# Patient Record
Sex: Female | Born: 1970
Health system: Southern US, Community
[De-identification: ages and names within clinical notes are randomized; demographics above are authoritative.]

## PROBLEM LIST (undated history)

## (undated) DIAGNOSIS — D649 Anemia, unspecified: Secondary | ICD-10-CM

## (undated) DIAGNOSIS — D219 Benign neoplasm of connective and other soft tissue, unspecified: Secondary | ICD-10-CM

## (undated) DIAGNOSIS — K219 Gastro-esophageal reflux disease without esophagitis: Secondary | ICD-10-CM

## (undated) HISTORY — PX: NO PAST SURGERIES: SHX2092

## (undated) HISTORY — PX: MULTIPLE TOOTH EXTRACTIONS: SHX2053

---

## 2008-11-16 ENCOUNTER — Emergency Department (HOSPITAL_COMMUNITY): Admission: EM | Admit: 2008-11-16 | Discharge: 2008-11-17 | Payer: Self-pay | Admitting: Emergency Medicine

## 2011-04-05 ENCOUNTER — Emergency Department (HOSPITAL_COMMUNITY)
Admission: EM | Admit: 2011-04-05 | Discharge: 2011-04-05 | Disposition: A | Discharge status: Home / Self Care | Payer: Medicaid Other | Attending: Emergency Medicine | Admitting: Emergency Medicine

## 2011-04-05 DIAGNOSIS — Z23 Encounter for immunization: Secondary | ICD-10-CM | POA: Insufficient documentation

## 2011-04-05 DIAGNOSIS — Y92009 Unspecified place in unspecified non-institutional (private) residence as the place of occurrence of the external cause: Secondary | ICD-10-CM | POA: Insufficient documentation

## 2011-04-05 DIAGNOSIS — S01409A Unspecified open wound of unspecified cheek and temporomandibular area, initial encounter: Secondary | ICD-10-CM | POA: Insufficient documentation

## 2011-11-11 ENCOUNTER — Emergency Department (HOSPITAL_COMMUNITY)
Admission: EM | Admit: 2011-11-11 | Discharge: 2011-11-11 | Disposition: A | Discharge status: Home / Self Care | Payer: Medicaid Other | Attending: Emergency Medicine | Admitting: Emergency Medicine

## 2011-11-11 ENCOUNTER — Encounter (HOSPITAL_COMMUNITY): Payer: Self-pay

## 2011-11-11 DIAGNOSIS — S0003XA Contusion of scalp, initial encounter: Secondary | ICD-10-CM | POA: Insufficient documentation

## 2011-11-11 DIAGNOSIS — W06XXXA Fall from bed, initial encounter: Secondary | ICD-10-CM | POA: Insufficient documentation

## 2011-11-11 DIAGNOSIS — S0083XA Contusion of other part of head, initial encounter: Secondary | ICD-10-CM

## 2011-11-11 DIAGNOSIS — S0180XA Unspecified open wound of other part of head, initial encounter: Secondary | ICD-10-CM | POA: Insufficient documentation

## 2011-11-11 DIAGNOSIS — R51 Headache: Secondary | ICD-10-CM | POA: Insufficient documentation

## 2011-11-11 MED ORDER — IBUPROFEN 800 MG PO TABS
800.0000 mg | ORAL_TABLET | Freq: Once | ORAL | Status: AC
  Administered 2011-11-11: 800 mg via ORAL
  Filled 2011-11-11: qty 1

## 2011-11-11 NOTE — ED Notes (Signed)
Pt presents with laceration and hematoma to forehead after falling in bedroom, striking head on door sill. -LOC

## 2011-11-11 NOTE — ED Provider Notes (Signed)
History     CSN: [id number]  Arrival date & time 11/11/11  1602   First MD Initiated Contact with Patient 11/11/11 1647      Chief Complaint  Patient presents with  . Fall    (Consider location/radiation/quality/duration/timing/severity/associated sxs/prior treatment) HPI Comments: Patient presents after she got caught in his sheets in her bed and fell out of her bed and hit her for head on the door frame.  She did not lose consciousness.  She has pain that is localized primarily to her central forehead and ridge for nose.  She has a linear vertical laceration to her forehead.  No visual disturbances.  No nausea or vomiting.  No significant headache.  No other injuries.  Patient is a 41 y.o. female presenting with fall. The history is provided by the patient. No language interpreter was used.  Fall The accident occurred less than 1 hour ago. The fall occurred while walking. The volume of blood lost was minimal. The point of impact was the head. The pain is present in the head. The pain is moderate. She was ambulatory at the scene. There was no entrapment after the fall. There was no drug use involved in the accident. There was no alcohol use involved in the accident. Pertinent negatives include no fever, no abdominal pain, no nausea, no vomiting and no headaches.    History reviewed. No pertinent past medical history.  History reviewed. No pertinent past surgical history.  History reviewed. No pertinent family history.  History  Substance Use Topics  . Smoking status: Former Games developer  . Smokeless tobacco: Not on file  . Alcohol Use: Yes    OB History    Grav Para Term Preterm Abortions TAB SAB Ect Mult Living                  Review of Systems  Constitutional: Negative.  Negative for fever and chills.  HENT: Negative.   Eyes: Negative.  Negative for discharge and redness.  Respiratory: Negative.  Negative for cough and shortness of breath.   Cardiovascular: Negative.   Negative for chest pain.  Gastrointestinal: Negative.  Negative for nausea, vomiting, abdominal pain and diarrhea.  Genitourinary: Negative.  Negative for dysuria and vaginal discharge.  Musculoskeletal: Negative.  Negative for back pain.  Skin: Positive for wound. Negative for color change and rash.  Neurological: Negative.  Negative for syncope and headaches.  Hematological: Negative.  Negative for adenopathy.  Psychiatric/Behavioral: Negative.  Negative for confusion.  All other systems reviewed and are negative.    Allergies  Review of patient's allergies indicates no known allergies.  Home Medications  No current outpatient prescriptions on file.  BP 121/87  Pulse 104  Temp(Src) 98.1 F (36.7 C) (Oral)  Resp 18  Ht 5\' 3"  (1.6 m)  Wt 120 lb (54.432 kg)  BMI 21.26 kg/m2  SpO2 100%  LMP 10/28/2011  Physical Exam  Nursing note and vitals reviewed. Constitutional: She is oriented to person, place, and time. She appears well-developed and well-nourished.  Non-toxic appearance. She does not have a sickly appearance.  HENT:  Head: Normocephalic and atraumatic.       Central forehead has a small hematoma with a vertical laceration overlying.  No tenderness to palpation over her nose.  No tenderness to palpation orbits.  Extraocular eye movements are intact.  Eyes: Conjunctivae, EOM and lids are normal. Pupils are equal, round, and reactive to light. No scleral icterus.  Neck: Trachea normal and normal range of motion.  Neck supple.  Cardiovascular: Normal rate.   Pulmonary/Chest: Effort normal.  Abdominal: Soft. Normal appearance. There is no CVA tenderness.  Musculoskeletal: Normal range of motion.  Neurological: She is alert and oriented to person, place, and time. She has normal strength.  Skin: Skin is warm, dry and intact. No rash noted.  Psychiatric: She has a normal mood and affect. Her behavior is normal. Judgment and thought content normal.    ED Course  Procedures  (including critical care time)  Labs Reviewed - No data to display No results found.   No diagnosis found.    MDM  Tetanus immunization is up-to-date.  The laceration is so narrow and shallow that it does not require further closure or even Dermabond on the skin.  Patient has been advised to use ice to decrease the swelling and Tylenol and ibuprofen for pain.  As she did not lose consciousness and has no concerning symptoms at this time and did not feel she warrants a CT head and that she can be safely discharged home.        Nat Christen, MD 11/11/11 [phone]

## 2011-11-11 NOTE — ED Notes (Signed)
Patient states that she was tangled in her sheets and fell when she tried to get out of bed. States that she struck the door frame with her head. A 1.5 cm laceration is noted on her center forehead. Also some swelling and bruising. She C/O having a bad headache.  Pupils are 3 mm equal and reactive.

## 2013-01-27 ENCOUNTER — Emergency Department (HOSPITAL_BASED_OUTPATIENT_CLINIC_OR_DEPARTMENT_OTHER): Payer: BC Managed Care – PPO

## 2013-01-27 ENCOUNTER — Encounter (HOSPITAL_BASED_OUTPATIENT_CLINIC_OR_DEPARTMENT_OTHER): Payer: Self-pay | Admitting: *Deleted

## 2013-01-27 ENCOUNTER — Emergency Department (HOSPITAL_BASED_OUTPATIENT_CLINIC_OR_DEPARTMENT_OTHER)
Admission: EM | Admit: 2013-01-27 | Discharge: 2013-01-27 | Disposition: A | Discharge status: Home / Self Care | Payer: BC Managed Care – PPO | Attending: Emergency Medicine | Admitting: Emergency Medicine

## 2013-01-27 DIAGNOSIS — N8000 Endometriosis of the uterus, unspecified: Secondary | ICD-10-CM | POA: Insufficient documentation

## 2013-01-27 DIAGNOSIS — R5381 Other malaise: Secondary | ICD-10-CM | POA: Insufficient documentation

## 2013-01-27 DIAGNOSIS — R42 Dizziness and giddiness: Secondary | ICD-10-CM | POA: Insufficient documentation

## 2013-01-27 DIAGNOSIS — R109 Unspecified abdominal pain: Secondary | ICD-10-CM | POA: Insufficient documentation

## 2013-01-27 DIAGNOSIS — Z3202 Encounter for pregnancy test, result negative: Secondary | ICD-10-CM | POA: Insufficient documentation

## 2013-01-27 DIAGNOSIS — R5383 Other fatigue: Secondary | ICD-10-CM | POA: Insufficient documentation

## 2013-01-27 DIAGNOSIS — N949 Unspecified condition associated with female genital organs and menstrual cycle: Secondary | ICD-10-CM | POA: Insufficient documentation

## 2013-01-27 DIAGNOSIS — Z87891 Personal history of nicotine dependence: Secondary | ICD-10-CM | POA: Insufficient documentation

## 2013-01-27 DIAGNOSIS — N938 Other specified abnormal uterine and vaginal bleeding: Secondary | ICD-10-CM | POA: Insufficient documentation

## 2013-01-27 LAB — WET PREP, GENITAL: Trich, Wet Prep: NONE SEEN

## 2013-01-27 LAB — URINALYSIS, ROUTINE W REFLEX MICROSCOPIC
Bilirubin Urine: NEGATIVE
Specific Gravity, Urine: 1.025 (ref 1.005–1.030)
pH: 5 (ref 5.0–8.0)

## 2013-01-27 LAB — URINE MICROSCOPIC-ADD ON

## 2013-01-27 NOTE — ED Notes (Signed)
MD at bedside. 

## 2013-01-27 NOTE — ED Provider Notes (Signed)
History    This chart was scribed for Megan B. Bernette Mayers, MD scribed by Magnus Sinning. The patient was seen in room MH12/MH12 at 16:16   CSN: [id number]  Arrival date & time 01/27/13  1518  Chief Complaint  Patient presents with  . Vaginal Bleeding    (Consider location/radiation/quality/duration/timing/severity/associated sxs/prior treatment) Patient is a 42 y.o. female presenting with vaginal bleeding. The history is provided by the patient. No language interpreter was used.  Vaginal Bleeding   Megan Chaney is a 42 y.o. female who presents to the Emergency Department complaining of one day of constant moderate vaginal bleeding with associated mild malaise, mild light-headedness today and mild abd cramping yesterday, which she treated with Bayer with relief. The patient explains this is the normal time for her period. However, she notes she has been bleeding more than normal for typical initial onset of period. She says this morning she had to change her pad within an hour of placing a new one and does report possibility of pregnancy, but is notified in ED that pregnancy test is negative.   Pt states her OB GYN is Dr. Dimple Casey with [facility] and she notes  hx of abnormal pap smear a long time ago. She says she has had regular pap smears every year, but provides she might be about a  month late in having pap smear this year.   History reviewed. No pertinent past medical history.  History reviewed. No pertinent past surgical history.  History reviewed. No pertinent family history.  History  Substance Use Topics  . Smoking status: Former Games developer  . Smokeless tobacco: Not on file  . Alcohol Use: Yes   Review of Systems  Genitourinary: Positive for vaginal bleeding and menstrual problem.  Neurological: Positive for light-headedness.  All other systems reviewed and are negative.    Allergies  Review of patient's allergies indicates no known allergies.  Home Medications  No  current outpatient prescriptions on file.  BP 118/94  Pulse 112  Temp(Src) 98.3 F (36.8 C) (Oral)  Resp 18  Ht 5' 3.75" (1.619 m)  Wt 130 lb (58.968 kg)  BMI 22.5 kg/m2  SpO2 98%  Physical Exam  Nursing note and vitals reviewed. Constitutional: She is oriented to person, place, and time. She appears well-developed and well-nourished.  HENT:  Head: Normocephalic and atraumatic.  Eyes: EOM are normal. Pupils are equal, round, and reactive to light.  Neck: Normal range of motion. Neck supple.  Cardiovascular: Normal rate, normal heart sounds and intact distal pulses.   Pulmonary/Chest: Effort normal and breath sounds normal.  Abdominal: Bowel sounds are normal. She exhibits no distension. There is no tenderness.  Genitourinary:  Active bleeding, no lacerations, normal cervix, no discharge. No tenderness with palpation, no CMT. Uterus feels enlarged, particularly posteriorly  Musculoskeletal: Normal range of motion. She exhibits no edema and no tenderness.  Neurological: She is alert and oriented to person, place, and time. She has normal strength. No cranial nerve deficit or sensory deficit.  Skin: Skin is warm and dry. No rash noted.  Psychiatric: She has a normal mood and affect. Her behavior is normal.    ED Course  Procedures (including critical care time) DIAGNOSTIC STUDIES: Oxygen Saturation is 98% on room air, normal by my interpretation.    COORDINATION OF CARE: 16: 17: Physical exam performed.  Labs Reviewed  WET PREP, GENITAL - Abnormal; Notable for the following:    Clue Cells Wet Prep HPF POC FEW (*)    All other  components within normal limits  URINALYSIS, ROUTINE W REFLEX MICROSCOPIC - Abnormal; Notable for the following:    Color, Urine AMBER (*)    APPearance CLOUDY (*)    Hgb urine dipstick LARGE (*)    Protein, ur 30 (*)    Leukocytes, UA SMALL (*)    All other components within normal limits  URINE MICROSCOPIC-ADD ON - Abnormal; Notable for the  following:    Squamous Epithelial / LPF FEW (*)    Bacteria, UA MANY (*)    All other components within normal limits  URINE CULTURE  GC/CHLAMYDIA PROBE AMP  PREGNANCY, URINE   US Transvaginal Non-ob  01/27/2013  *RADIOLOGY REPORT*  Clinical Data: Heavy bleeding.  Passing clots.  Question of fibroids.  LMP 01/26/2013.  TRANSABDOMINAL AND TRANSVAGINAL ULTRASOUND OF PELVIS Technique:  Both transabdominal and transvaginal ultrasound examinations of the pelvis were performed. Transabdominal technique was performed for global imaging of the pelvis including uterus, ovaries, adnexal regions, and pelvic cul-de-sac.  It was necessary to proceed with endovaginal exam following the transabdominal exam to visualize the uterus, endometrium, ovaries, and adnexal regions.  Comparison:  None  Findings:  Uterus: 9.3 x 4.9 x 5.4 cm.  Anterior, subserosal fibroid is 1.8 x 1.8 x 1.5 cm.  Posterior subserosal fibroid is 2.1 x 1.8 x 2.0 cm.  Endometrium: 6.9 mm, homogeneous.  Anterior to the endometrial stripe, there is heterogeneous appearance of the myometrium, raising the question of adenomyosis.  Right ovary:  2.6 x 1.6 x 2.3 cm, normal in appearance.  Left ovary: 3.4 x 1.9 x 2.3 cm, normal in appearance.  Other findings: No free fluid  IMPRESSION:  1.  Small fibroids, subserosal in location. 2. Normal thicknessof the endometrial stripe 3.  Question of adenomyosis in the anterior aspect the uterus.  See above.   Original Report Authenticated By: Norva Pavlov, M.D.    US Pelvis Complete  01/27/2013  *RADIOLOGY REPORT*  Clinical Data: Heavy bleeding.  Passing clots.  Question of fibroids.  LMP 01/26/2013.  TRANSABDOMINAL AND TRANSVAGINAL ULTRASOUND OF PELVIS Technique:  Both transabdominal and transvaginal ultrasound examinations of the pelvis were performed. Transabdominal technique was performed for global imaging of the pelvis including uterus, ovaries, adnexal regions, and pelvic cul-de-sac.  It was necessary to  proceed with endovaginal exam following the transabdominal exam to visualize the uterus, endometrium, ovaries, and adnexal regions.  Comparison:  None  Findings:  Uterus: 9.3 x 4.9 x 5.4 cm.  Anterior, subserosal fibroid is 1.8 x 1.8 x 1.5 cm.  Posterior subserosal fibroid is 2.1 x 1.8 x 2.0 cm.  Endometrium: 6.9 mm, homogeneous.  Anterior to the endometrial stripe, there is heterogeneous appearance of the myometrium, raising the question of adenomyosis.  Right ovary:  2.6 x 1.6 x 2.3 cm, normal in appearance.  Left ovary: 3.4 x 1.9 x 2.3 cm, normal in appearance.  Other findings: No free fluid  IMPRESSION:  1.  Small fibroids, subserosal in location. 2. Normal thicknessof the endometrial stripe 3.  Question of adenomyosis in the anterior aspect the uterus.  See above.   Original Report Authenticated By: Norva Pavlov, M.D.      1. DUB (dysfunctional uterine bleeding)   2. Adenomyosis       MDM  Pt not pregnant, suspect this is fibroids, will send for [place].  I personally performed the services described in this documentation, which was scribed in my presence. The recorded information has been reviewed and is accurate.  7:02 PM [place] results reviewed  with patient. She is not having any pain now. Bleeding is improved since arrival. Advised close Gyn followup for recheck. Will hold off on Provera for now given short duration of bleeding.         Megan B. Bernette Mayers, MD 01/27/13 1904

## 2013-01-27 NOTE — ED Notes (Signed)
Pelvic cart is set up at the bedside and ready for the doctor to use.

## 2013-01-27 NOTE — ED Notes (Signed)
Patient still in   Ultra sound

## 2013-01-27 NOTE — ED Notes (Signed)
Pt states this is the nrl time for her period and she has been bleeding heavily since 0500. Using 2 Super pads/30 minutes. Passing clots. Tired now. Cramping yesterday, but denies other s/s.

## 2013-01-29 LAB — GC/CHLAMYDIA PROBE AMP
CT Probe RNA: NEGATIVE
GC Probe RNA: NEGATIVE

## 2013-01-29 LAB — URINE CULTURE

## 2013-01-30 ENCOUNTER — Telehealth (HOSPITAL_COMMUNITY): Payer: Self-pay | Admitting: Emergency Medicine

## 2013-01-31 ENCOUNTER — Ambulatory Visit (INDEPENDENT_AMBULATORY_CARE_PROVIDER_SITE_OTHER): Payer: BC Managed Care – PPO | Admitting: Advanced Practice Midwife

## 2013-01-31 ENCOUNTER — Encounter: Payer: Self-pay | Admitting: Advanced Practice Midwife

## 2013-01-31 ENCOUNTER — Telehealth (HOSPITAL_COMMUNITY): Payer: Self-pay | Admitting: Emergency Medicine

## 2013-01-31 VITALS — BP 133/88 | HR 82 | Temp 97.6°F | Ht 63.0 in | Wt 136.3 lb

## 2013-01-31 DIAGNOSIS — N938 Other specified abnormal uterine and vaginal bleeding: Secondary | ICD-10-CM

## 2013-01-31 DIAGNOSIS — N949 Unspecified condition associated with female genital organs and menstrual cycle: Secondary | ICD-10-CM

## 2013-01-31 DIAGNOSIS — N926 Irregular menstruation, unspecified: Secondary | ICD-10-CM

## 2013-01-31 NOTE — Progress Notes (Signed)
Megan Chaney is a 42 y.o. G0 who is here as a F/U from [facility] for abnormal bleeding. Patient's last menstrual period was 01/26/2013. Heavier than usual, not painful. U/S on 4/4 showed 2 small fibroids and ? Adenomyosis in anterior part of uterus. Periods have been regular prior to this, not heavy, not painful. "Used to be really bad" when she was younger, no problems in several years. Considering getting pregnant. Pelvic exam at ED visit with negative GC/CT on 01/26/13.   Medical history - non contributory  No Known Allergies  Medications: None  Review of Systems  Constitutional: Negative.   Respiratory: Negative.   Cardiovascular: Negative.   Gastrointestinal: Negative for nausea, vomiting, abdominal pain, diarrhea and constipation.  Genitourinary: Negative for dysuria, urgency, frequency, hematuria and flank pain.       Negative for vaginal bleeding, vaginal discharge, dyspareunia  Musculoskeletal: Negative.   Neurological: Negative.   Psychiatric/Behavioral: Negative.    Objective Physical Exam  Nursing note and vitals reviewed. Constitutional: She is oriented to person, place, and time. She appears well-developed and well-nourished. No distress.  Cardiovascular: Normal rate.   Pulmonary/Chest: Effort normal.  Genitourinary:  Deferred, pelvic exam in ED on 4/4  Neurological: She is alert and oriented to person, place, and time.  Skin: Skin is warm and dry.  Psychiatric: She has a normal mood and affect.   A/P: Abnormal bleeding x 1 cycle TSH today F/U PRN if periods are heavy/painful/irregular Discussed trying to get pregnant - encouraged prenatal vitamins, healthy lifestyle, discussed cycles, ovulation predictors - f/u if not pregnant within 6 months of concerted efforts

## 2013-02-01 ENCOUNTER — Telehealth (HOSPITAL_COMMUNITY): Payer: Self-pay | Admitting: Emergency Medicine

## 2013-02-05 ENCOUNTER — Encounter: Payer: Self-pay | Admitting: Advanced Practice Midwife

## 2018-06-30 ENCOUNTER — Inpatient Hospital Stay (HOSPITAL_COMMUNITY)
Admission: AD | Admit: 2018-06-30 | Discharge: 2018-06-30 | Disposition: A | Discharge status: Home / Self Care | Payer: BLUE CROSS/BLUE SHIELD | Source: Ambulatory Visit | Attending: Obstetrics and Gynecology | Admitting: Obstetrics and Gynecology

## 2018-06-30 ENCOUNTER — Encounter (HOSPITAL_COMMUNITY): Payer: Self-pay | Admitting: *Deleted

## 2018-06-30 DIAGNOSIS — N939 Abnormal uterine and vaginal bleeding, unspecified: Secondary | ICD-10-CM | POA: Insufficient documentation

## 2018-06-30 DIAGNOSIS — B9689 Other specified bacterial agents as the cause of diseases classified elsewhere: Secondary | ICD-10-CM | POA: Diagnosis not present

## 2018-06-30 DIAGNOSIS — F5089 Other specified eating disorder: Secondary | ICD-10-CM | POA: Insufficient documentation

## 2018-06-30 DIAGNOSIS — Z87891 Personal history of nicotine dependence: Secondary | ICD-10-CM | POA: Insufficient documentation

## 2018-06-30 DIAGNOSIS — N760 Acute vaginitis: Secondary | ICD-10-CM | POA: Diagnosis not present

## 2018-06-30 DIAGNOSIS — Z6822 Body mass index (BMI) 22.0-22.9, adult: Secondary | ICD-10-CM | POA: Insufficient documentation

## 2018-06-30 DIAGNOSIS — R102 Pelvic and perineal pain: Secondary | ICD-10-CM | POA: Diagnosis present

## 2018-06-30 DIAGNOSIS — N946 Dysmenorrhea, unspecified: Secondary | ICD-10-CM | POA: Diagnosis not present

## 2018-06-30 HISTORY — DX: Benign neoplasm of connective and other soft tissue, unspecified: D21.9

## 2018-06-30 HISTORY — DX: Anemia, unspecified: D64.9

## 2018-06-30 HISTORY — DX: Gastro-esophageal reflux disease without esophagitis: K21.9

## 2018-06-30 LAB — URINALYSIS, ROUTINE W REFLEX MICROSCOPIC
BACTERIA UA: NONE SEEN
BILIRUBIN URINE: NEGATIVE
GLUCOSE, UA: NEGATIVE mg/dL
Ketones, ur: NEGATIVE mg/dL
LEUKOCYTES UA: NEGATIVE
Nitrite: NEGATIVE
Protein, ur: 100 mg/dL — AB
Specific Gravity, Urine: 1.032 — ABNORMAL HIGH (ref 1.005–1.030)
pH: 5 (ref 5.0–8.0)

## 2018-06-30 LAB — WET PREP, GENITAL
SPERM: NONE SEEN
Trich, Wet Prep: NONE SEEN
YEAST WET PREP: NONE SEEN

## 2018-06-30 LAB — CBC
HEMATOCRIT: 25.1 % — AB (ref 36.0–46.0)
Hemoglobin: 7.4 g/dL — ABNORMAL LOW (ref 12.0–15.0)
MCH: 19.5 pg — ABNORMAL LOW (ref 26.0–34.0)
MCHC: 29.5 g/dL — ABNORMAL LOW (ref 30.0–36.0)
MCV: 66.1 fL — AB (ref 78.0–100.0)
Platelets: 386 10*3/uL (ref 150–400)
RBC: 3.8 MIL/uL — AB (ref 3.87–5.11)
RDW: 19 % — ABNORMAL HIGH (ref 11.5–15.5)
WBC: 9.5 10*3/uL (ref 4.0–10.5)

## 2018-06-30 LAB — POCT PREGNANCY, URINE: PREG TEST UR: NEGATIVE

## 2018-06-30 MED ORDER — FERROUS SULFATE 325 (65 FE) MG PO TABS
325.0000 mg | ORAL_TABLET | Freq: Three times a day (TID) | ORAL | 0 refills | Status: DC
Start: 2018-06-30 — End: 2018-07-24

## 2018-06-30 MED ORDER — METRONIDAZOLE 500 MG PO TABS: 500.0000 mg | ORAL_TABLET | Freq: Two times a day (BID) | ORAL | 0 refills | Status: DC

## 2018-06-30 MED ORDER — MEGESTROL ACETATE 40 MG PO TABS: 40.0000 mg | ORAL_TABLET | Freq: Two times a day (BID) | ORAL | 0 refills | Status: DC

## 2018-06-30 MED ORDER — KETOROLAC TROMETHAMINE 60 MG/2ML IM SOLN
60.0000 mg | Freq: Once | INTRAMUSCULAR | Status: AC
  Administered 2018-06-30: 60 mg via INTRAMUSCULAR
  Filled 2018-06-30: qty 2

## 2018-06-30 MED ORDER — NAPROXEN 500 MG PO TABS: 500.0000 mg | ORAL_TABLET | Freq: Two times a day (BID) | ORAL | 0 refills | Status: DC

## 2018-06-30 NOTE — Discharge Instructions (Signed)

## 2018-06-30 NOTE — MAU Note (Addendum)
Having spotting for few wks and today started bleeding heavily. Having abd cramping and legs cramping, esp R leg. Took Tylenol 500mg  x 2 about an hour ago that has helped alittle. Hx fibroids and heavy vag bleeding at times

## 2018-06-30 NOTE — MAU Provider Note (Signed)
History     CSN: [id number]  Arrival date and time: 06/30/18 1918   First Provider Initiated Contact with Patient 06/30/18 2140      Chief Complaint  Patient presents with  . Abdominal Pain  . Vaginal Bleeding   Vaginal Bleeding  The patient's primary symptoms include pelvic pain and vaginal bleeding. This is a new problem. Episode onset: 2 weeks ago. The problem occurs constantly. The problem has been gradually worsening (started as spotting, and today it became heavy bleeding. ). The problem affects both sides. She is not pregnant. Pertinent negatives include no chills, dysuria, fever, frequency, nausea or vomiting. The vaginal discharge was bloody. The vaginal bleeding is heavier than menses. She has been passing clots. She has not been passing tissue. Nothing aggravates the symptoms. She has tried acetaminophen for the symptoms. The treatment provided no relief. Her past medical history is significant for menorrhagia. (Fibroids )    OB History    Gravida  0   Para  0   Term  0   Preterm  0   AB  0   Living  0     SAB  0   TAB  0   Ectopic  0   Multiple  0   Live Births  0           Past Medical History:  Diagnosis Date  . Anemia   . Fibroid   . GERD (gastroesophageal reflux disease)     Past Surgical History:  Procedure Laterality Date  . NO PAST SURGERIES      No family history on file.  Social History   Tobacco Use  . Smoking status: Former Research scientist (life sciences)  . Smokeless tobacco: Never Used  Substance Use Topics  . Alcohol use: Yes  . Drug use: Not Currently    Types: Marijuana    Allergies: No Known Allergies  No medications prior to admission.    Review of Systems  Constitutional: Negative for chills and fever.  Gastrointestinal: Negative for nausea and vomiting.  Genitourinary: Positive for menorrhagia, pelvic pain and vaginal bleeding. Negative for dysuria and frequency.   Physical Exam   Blood pressure (!) 121/58, pulse (!) 104,  temperature 98.1 F (36.7 C), resp. rate 18, height 5\' 3"  (1.6 m), weight 57.2 kg, SpO2 100 %.  Physical Exam  Nursing note and vitals reviewed. Constitutional: She is oriented to person, place, and time. She appears well-developed and well-nourished. No distress.  HENT:  Head: Normocephalic.  Cardiovascular: Normal rate.  Respiratory: Effort normal.  GI: Soft. There is no tenderness. There is no rebound.  Genitourinary:  Genitourinary Comments:  External: no lesion Vagina: small amount of bright red bleeding noted  Cervix: pink, smooth, no CMT Uterus: NSSC Adnexa: NT   Neurological: She is alert and oriented to person, place, and time.  Skin: Skin is warm and dry.  Psychiatric: She has a normal mood and affect.   Results for orders placed or performed during the hospital encounter of 06/30/18 (from the past 24 hour(s))  CBC     Status: Abnormal   Collection Time: 06/30/18  8:18 PM  Result Value Ref Range   WBC 9.5 4.0 - 10.5 K/uL   RBC 3.80 (L) 3.87 - 5.11 MIL/uL   Hemoglobin 7.4 (L) 12.0 - 15.0 g/dL   HCT 25.1 (L) 36.0 - 46.0 %   MCV 66.1 (L) 78.0 - 100.0 fL   MCH 19.5 (L) 26.0 - 34.0 pg   MCHC 29.5 (L)  30.0 - 36.0 g/dL   RDW 19.0 (H) 11.5 - 15.5 %   Platelets 386 150 - 400 K/uL  Wet prep, genital     Status: Abnormal   Collection Time: 06/30/18  9:59 PM  Result Value Ref Range   Yeast Wet Prep HPF POC NONE SEEN NONE SEEN   Trich, Wet Prep NONE SEEN NONE SEEN   Clue Cells Wet Prep HPF POC PRESENT (A) NONE SEEN   WBC, Wet Prep HPF POC FEW (A) NONE SEEN   Sperm NONE SEEN   Pregnancy, urine POC     Status: None   Collection Time: 06/30/18 10:01 PM  Result Value Ref Range   Preg Test, Ur NEGATIVE NEGATIVE    MAU Course  Procedures  MDM Patient reports eating red clay dirt to help with her anemia. Recommended that patient not do this and consider other supplements such as Floradix instead.    Patient has had toradol. She reports feeling better.  Assessment and  Plan   1. Abnormal uterine bleeding   2. Dysmenorrhea   3. Pica in adults   4. Bacterial vaginosis    DC home Comfort measures reviewed  Bleeding precautions RX: iron sulfate, megace, naproxen, flagyl 500mg  BID  Return to MAU as needed   [place] [facility] [place] Follow up.   Specialty:  Obstetrics and Gynecology Contact information: [address] [place] [address] [place] 06/30/2018, 10:22 PM

## 2018-07-03 LAB — GC/CHLAMYDIA PROBE AMP (~~LOC~~) NOT AT ARMC
Chlamydia: NEGATIVE
Neisseria Gonorrhea: NEGATIVE

## 2018-07-24 ENCOUNTER — Other Ambulatory Visit: Payer: Self-pay | Admitting: Advanced Practice Midwife

## 2018-07-28 ENCOUNTER — Other Ambulatory Visit: Payer: Self-pay

## 2018-07-28 ENCOUNTER — Encounter (HOSPITAL_COMMUNITY): Payer: Self-pay

## 2018-07-28 ENCOUNTER — Inpatient Hospital Stay (HOSPITAL_COMMUNITY)
Admission: AD | Admit: 2018-07-28 | Discharge: 2018-07-28 | Disposition: A | Discharge status: Home / Self Care | Payer: BLUE CROSS/BLUE SHIELD | Source: Ambulatory Visit | Attending: Family Medicine | Admitting: Family Medicine

## 2018-07-28 DIAGNOSIS — D649 Anemia, unspecified: Secondary | ICD-10-CM | POA: Insufficient documentation

## 2018-07-28 DIAGNOSIS — N946 Dysmenorrhea, unspecified: Secondary | ICD-10-CM | POA: Diagnosis not present

## 2018-07-28 DIAGNOSIS — Z87891 Personal history of nicotine dependence: Secondary | ICD-10-CM | POA: Insufficient documentation

## 2018-07-28 DIAGNOSIS — R109 Unspecified abdominal pain: Secondary | ICD-10-CM | POA: Diagnosis present

## 2018-07-28 DIAGNOSIS — R102 Pelvic and perineal pain: Secondary | ICD-10-CM | POA: Diagnosis not present

## 2018-07-28 DIAGNOSIS — Z888 Allergy status to other drugs, medicaments and biological substances status: Secondary | ICD-10-CM | POA: Insufficient documentation

## 2018-07-28 LAB — CBC
HCT: 32.1 % — ABNORMAL LOW (ref 36.0–46.0)
Hemoglobin: 9.6 g/dL — ABNORMAL LOW (ref 12.0–15.0)
MCH: 22.3 pg — ABNORMAL LOW (ref 26.0–34.0)
MCHC: 29.9 g/dL — AB (ref 30.0–36.0)
MCV: 74.7 fL — AB (ref 78.0–100.0)
PLATELETS: 313 10*3/uL (ref 150–400)
RBC: 4.3 MIL/uL (ref 3.87–5.11)
RDW: 25.4 % — ABNORMAL HIGH (ref 11.5–15.5)
WBC: 9.5 10*3/uL (ref 4.0–10.5)

## 2018-07-28 LAB — URINALYSIS, ROUTINE W REFLEX MICROSCOPIC
Bilirubin Urine: NEGATIVE
GLUCOSE, UA: NEGATIVE mg/dL
Ketones, ur: NEGATIVE mg/dL
NITRITE: NEGATIVE
Protein, ur: 100 mg/dL — AB
RBC / HPF: >50 RBC/hpf — ABNORMAL HIGH (ref 0–5)
SPECIFIC GRAVITY, URINE: 1.024 (ref 1.005–1.030)
pH: 7 (ref 5.0–8.0)

## 2018-07-28 LAB — POCT PREGNANCY, URINE: Preg Test, Ur: NEGATIVE

## 2018-07-28 MED ORDER — KETOROLAC TROMETHAMINE 60 MG/2ML IM SOLN
60.0000 mg | INTRAMUSCULAR | Status: AC
  Administered 2018-07-28: 60 mg via INTRAMUSCULAR
  Filled 2018-07-28: qty 2

## 2018-07-28 MED ORDER — TRAMADOL HCL 50 MG PO TABS: 50.0000 mg | ORAL_TABLET | Freq: Four times a day (QID) | ORAL | 0 refills | Status: AC | PRN

## 2018-07-28 NOTE — MAU Note (Addendum)
Pt states she did not get the megace prescribed but did take the other medication.  Pt has appointment in the clinic on Monday.

## 2018-07-28 NOTE — Discharge Instructions (Signed)
In late February 2020, the [facility] will be moving to the [facility] At that time, the MAU (Maternity Admissions Unit), where you are being seen today, will no longer take care of non-pregnant patients. We strongly encourage you to find a doctor's office before that time, so that you can be seen with any GYN concerns, like vaginal discharge, urinary tract infection, etc.. in a timely manner.  In order to make an office visit more convenient, the Center for [place] at [facility] will be offering evening hours with same-day appointments, walk-in appointments and scheduled appointments available during this time.  Center for [facility] @ [facility] Hours: Monday - 8am - 7:30 pm with walk-in between 4pm- 7:30 pm Tuesday - 8 am - 5 pm (open late and accepting walk-ins from 4pm - 7:30pm) Wednesday - 8 am - 5 pm (open late and accepting walk-ins from 4pm - 7:30pm) Thursday 8 am - 5 pm (starting 07/27/18 we will be open late and accepting walk-ins from 4pm - 7:30pm) Friday 8 am - 5 pm  For an appointment please call the Center for [place] @ [facility] at [phone]  For urgent needs, [facility] is also available for management of urgent GYN complaints such as vaginal discharge or urinary tract infections.

## 2018-07-28 NOTE — MAU Note (Signed)
Having really bad cramps, started yesterday, intensified today. Making her dizzy and breaking out in a sweat.  Having pain in rt leg, been going on for a while- not bothering her right now because she took something for pain. (at least since Feb)

## 2018-07-28 NOTE — MAU Provider Note (Signed)
History     CSN: [id number]  Arrival date and time: 07/28/18 1239   First Provider Initiated Contact with Patient 07/28/18 1636      Chief Complaint  Patient presents with  . Abdominal Pain  . Leg Pain   HPI  Ms.  Megan Chaney is a 47 y.o. year old G10P0000 female who presents to MAU reporting bad cramps with menses, dizziness, sweating, and leg pain since February 2019. She started her period on 07/27/18 and she started having "worse" cramps today. She reports that her menses are very regular and she routinely has menstrual cramps. She states her menses became heavier and "worse" in August 2019. She was seen in MAU last month for heavy VB, Rx'd Megace, but she didn't pick up the Rx. She stated her VB stopped without taking the Megace. She has an appointment at [facility] Monday 07/31/2018, but could not wait until then to be seen. She wants the pain to go away.  *Note: She states later that she fell down flight of stairs in January 2019 and "thinks" she "might have hurt" RT leg then. She has not been to any sports medicine or orthopedic doctor about her RT leg pain/injury.  Past Medical History:  Diagnosis Date  . Anemia   . Fibroid   . GERD (gastroesophageal reflux disease)     Past Surgical History:  Procedure Laterality Date  . NO PAST SURGERIES      No family history on file.  Social History   Tobacco Use  . Smoking status: Former Smoker    Last attempt to quit: 07/29/1999    Years since quitting: 19.0  . Smokeless tobacco: Never Used  Substance Use Topics  . Alcohol use: Yes  . Drug use: Not Currently    Types: Marijuana    Comment: Quit in July 2019    Allergies:  Allergies  Allergen Reactions  . Other Rash    Powder in latex gloves    Medications Prior to Admission  Medication Sig Dispense Refill Last Dose  . ferrous sulfate 325 (65 FE) MG tablet TAKE 1 TABLET (325 MG TOTAL) BY MOUTH 3 (THREE) TIMES DAILY WITH MEALS. 90 tablet 0 07/27/2018 at Unknown time   . fluticasone (FLONASE) 50 MCG/ACT nasal spray Place 2 sprays into both nostrils daily as needed for allergies.    Past Month at Unknown time  . naproxen (NAPROSYN) 500 MG tablet Take 1 tablet (500 mg total) by mouth 2 (two) times daily. 30 tablet 0 07/28/2018 at Unknown time  . megestrol (MEGACE) 40 MG tablet Take 1 tablet (40 mg total) by mouth 2 (two) times daily. 60 tablet 0   . metroNIDAZOLE (FLAGYL) 500 MG tablet Take 1 tablet (500 mg total) by mouth 2 (two) times daily. (Patient not taking: Reported on 07/28/2018) 14 tablet 0 Not Taking at Unknown time    Review of Systems  Constitutional: Positive for diaphoresis.  HENT: Negative.   Eyes: Negative.   Respiratory: Negative.   Cardiovascular: Negative.   Endocrine: Negative.   Genitourinary: Positive for vaginal bleeding.  Musculoskeletal: Positive for myalgias.       Some tingling in back of RT leg that goes down to toes  Skin: Negative.   Allergic/Immunologic: Negative.   Neurological: Positive for dizziness.  Hematological: Negative.   Psychiatric/Behavioral: Negative.    Physical Exam   Blood pressure 131/66, pulse 64, temperature 98.1 F (36.7 C), temperature source Oral, resp. rate 18, weight 57.3 kg, last menstrual period 07/27/2018, SpO2  100 %.  Physical Exam  Nursing note and vitals reviewed. Constitutional: She is oriented to person, place, and time. She appears well-developed and well-nourished.  HENT:  Head: Normocephalic and atraumatic.  Eyes: Pupils are equal, round, and reactive to light.  Neck: Normal range of motion.  Cardiovascular: Normal rate.  Respiratory: Effort normal.  GI: Soft.  Genitourinary:  Genitourinary Comments: Pelvic deferred per pt request  Musculoskeletal: Normal range of motion.  Neurological: She is alert and oriented to person, place, and time.  Skin: Skin is warm and dry.  Psychiatric: She has a normal mood and affect. Her behavior is normal. Judgment and thought content normal.     MAU Course  Procedures  MDM CCUA UPT CBC Toradol 60 mg IM -- pain resolved  Results for orders placed or performed during the hospital encounter of 07/28/18 (from the past 24 hour(s))  CBC     Status: Abnormal   Collection Time: 07/28/18  1:15 PM  Result Value Ref Range   WBC 9.5 4.0 - 10.5 K/uL   RBC 4.30 3.87 - 5.11 MIL/uL   Hemoglobin 9.6 (L) 12.0 - 15.0 g/dL   HCT 32.1 (L) 36.0 - 46.0 %   MCV 74.7 (L) 78.0 - 100.0 fL   MCH 22.3 (L) 26.0 - 34.0 pg   MCHC 29.9 (L) 30.0 - 36.0 g/dL   RDW 25.4 (H) 11.5 - 15.5 %   Platelets 313 150 - 400 K/uL  Pregnancy, urine POC     Status: None   Collection Time: 07/28/18  1:20 PM  Result Value Ref Range   Preg Test, Ur NEGATIVE NEGATIVE  Urinalysis, Routine w reflex microscopic     Status: Abnormal   Collection Time: 07/28/18  1:28 PM  Result Value Ref Range   Color, Urine YELLOW YELLOW   APPearance CLOUDY (A) CLEAR   Specific Gravity, Urine 1.024 1.005 - 1.030   pH 7.0 5.0 - 8.0   Glucose, UA NEGATIVE NEGATIVE mg/dL   Hgb urine dipstick LARGE (A) NEGATIVE   Bilirubin Urine NEGATIVE NEGATIVE   Ketones, ur NEGATIVE NEGATIVE mg/dL   Protein, ur 100 (A) NEGATIVE mg/dL   Nitrite NEGATIVE NEGATIVE   Leukocytes, UA TRACE (A) NEGATIVE   RBC / HPF >50 (H) 0 - 5 RBC/hpf   WBC, UA 11-20 0 - 5 WBC/hpf   Bacteria, UA RARE (A) NONE SEEN   Squamous Epithelial / LPF 0-5 0 - 5   Mucus PRESENT     Assessment and Plan  Crampy pain associated with menses  - Information provided on pelvic pain in female - Rx for Ultram 50 mg every 6 hours prn pain - Discharge patient - Keep scheduled appt on Monday 10//7/19 - Patient verbalized an understanding of the plan of care and agrees.    Laury Deep, MSN, CNM 07/28/2018, 4:43 PM

## 2018-07-31 ENCOUNTER — Encounter: Payer: Self-pay | Admitting: Obstetrics & Gynecology

## 2018-07-31 ENCOUNTER — Ambulatory Visit (INDEPENDENT_AMBULATORY_CARE_PROVIDER_SITE_OTHER): Payer: BLUE CROSS/BLUE SHIELD | Admitting: Obstetrics & Gynecology

## 2018-07-31 VITALS — BP 103/56 | HR 98 | Ht 63.0 in | Wt 129.9 lb

## 2018-07-31 DIAGNOSIS — D259 Leiomyoma of uterus, unspecified: Secondary | ICD-10-CM | POA: Diagnosis not present

## 2018-07-31 DIAGNOSIS — F320 Major depressive disorder, single episode, mild: Secondary | ICD-10-CM

## 2018-07-31 DIAGNOSIS — N852 Hypertrophy of uterus: Secondary | ICD-10-CM

## 2018-07-31 DIAGNOSIS — N938 Other specified abnormal uterine and vaginal bleeding: Secondary | ICD-10-CM | POA: Diagnosis not present

## 2018-07-31 MED ORDER — MEDROXYPROGESTERONE ACETATE 10 MG PO TABS: 20.0000 mg | ORAL_TABLET | Freq: Every day | ORAL | 2 refills | Status: DC

## 2018-07-31 NOTE — Patient Instructions (Signed)

## 2018-07-31 NOTE — Progress Notes (Signed)
Patient ID: Megan Chaney, female   DOB: [birth year], 47 y.o.   MRN: [id number]  Chief Complaint  Patient presents with  . Vaginal Bleeding    HPI Megan Chaney is a 47 y.o. female.  G0P0000 Patient's last menstrual period was 07/27/2018. Presents with sx of heavy periods and US findings of fibroid uterus, with anemia. She had been seen by Dr Bethann Goo in [place] 2 years ago. HPI  Past Medical History:  Diagnosis Date  . Anemia   . Fibroid   . GERD (gastroesophageal reflux disease)     Past Surgical History:  Procedure Laterality Date  . MULTIPLE TOOTH EXTRACTIONS    . NO PAST SURGERIES      Family History  Problem Relation Age of Onset  . Chronic Renal Failure Father   . Miscarriages / Stillbirths Father   . Heart attack Mother   . Stroke Mother   . Cancer Mother   . Diabetes Mother     Social History Social History   Tobacco Use  . Smoking status: Former Smoker    Last attempt to quit: 07/29/1999    Years since quitting: 19.0  . Smokeless tobacco: Never Used  Substance Use Topics  . Alcohol use: Yes  . Drug use: Not Currently    Types: Marijuana    Comment: Quit in July 2019    Allergies  Allergen Reactions  . Other Rash    Powder in latex gloves    Current Outpatient Medications  Medication Sig Dispense Refill  . ferrous sulfate 325 (65 FE) MG tablet TAKE 1 TABLET (325 MG TOTAL) BY MOUTH 3 (THREE) TIMES DAILY WITH MEALS. 90 tablet 0  . fluticasone (FLONASE) 50 MCG/ACT nasal spray Place 2 sprays into both nostrils daily as needed for allergies.     . naproxen (NAPROSYN) 500 MG tablet Take 1 tablet (500 mg total) by mouth 2 (two) times daily. 30 tablet 0  . traMADol (ULTRAM) 50 MG tablet Take 1 tablet (50 mg total) by mouth every 6 (six) hours as needed. (Patient not taking: Reported on 07/31/2018) 20 tablet 0   No current facility-administered medications for this visit.     Review of Systems Review of Systems  Constitutional: Negative.   Genitourinary:  Positive for menstrual problem, pelvic pain and vaginal bleeding. Negative for vaginal discharge.    Blood pressure (!) 103/56, pulse 98, height 5\' 3"  (1.6 m), weight 129 lb 14.4 oz (58.9 kg), last menstrual period 07/27/2018.  Physical Exam Physical Exam  Constitutional: She appears well-developed. No distress.  Abdominal: Soft. She exhibits no distension and no mass. There is no tenderness.  Skin: Skin is warm and dry. No pallor.  Psychiatric: She has a normal mood and affect. Her behavior is normal.    Data Reviewed [place] 2017 Dr Bethann Goo Pap smear negative for intraepithelial lesion or malignancy Endometrial biopsy: Secretory endometrium Pelvic ultrasound done today Uterus is 9.8 x 5.8 x 8.4 cm with at least 4 fibroids, 3.2, 3.0, 2.4, and 2.4 cms in diameter respectively. Inhomogeneous myometrium suggestive of adenomyosis Endometrium 3.3 mm Right Ovary 2.9 x 2.2 x 1.9 cm Left ovary 4.1 x 2.3 x 1.7 cm  Assessment    Patient Active Problem List   Diagnosis Date Noted  . Enlarged uterus 07/31/2018  . Uterine leiomyoma 07/31/2018  . DUB (dysfunctional uterine bleeding) 07/31/2018       Plan    Repeat pelvic US  CBC today Provera 20 mg daily RTC 4 weeks  Emeterio Reeve 07/31/2018, 4:23 PM

## 2018-08-02 ENCOUNTER — Ambulatory Visit (INDEPENDENT_AMBULATORY_CARE_PROVIDER_SITE_OTHER): Payer: BLUE CROSS/BLUE SHIELD | Admitting: Clinical

## 2018-08-02 ENCOUNTER — Ambulatory Visit (HOSPITAL_COMMUNITY)
Admission: RE | Admit: 2018-08-02 | Discharge: 2018-08-02 | Disposition: A | Discharge status: Home / Self Care | Payer: BLUE CROSS/BLUE SHIELD | Source: Ambulatory Visit | Attending: Obstetrics & Gynecology | Admitting: Obstetrics & Gynecology

## 2018-08-02 DIAGNOSIS — F4323 Adjustment disorder with mixed anxiety and depressed mood: Secondary | ICD-10-CM

## 2018-08-02 DIAGNOSIS — D259 Leiomyoma of uterus, unspecified: Secondary | ICD-10-CM | POA: Diagnosis not present

## 2018-08-02 DIAGNOSIS — N852 Hypertrophy of uterus: Secondary | ICD-10-CM | POA: Insufficient documentation

## 2018-08-02 NOTE — BH Specialist Note (Signed)
Integrated Behavioral Health Initial Visit  MRN: [id number] Name: Megan Chaney  Number of [place] Clinician visits:: 1/6 Session Start time: 2:04 Session End time: 3:05 Total time: 1 hour  Type of Service: [place] Interpretor:No. Interpretor Name and Language: n/a   Warm Hand Off Completed.       SUBJECTIVE: Megan Chaney is a 47 y.o. female accompanied by n/a Patient was referred by Emeterio Reeve, MD for depression. Patient reports the following symptoms/concerns: Pt states her primary concern today is increasing fatigue, worry, and pain, that she atttributes to fibroids and anemia, along with numerous early deaths in her family(parents, and in the past two years: brother, sister; infant niece), and remaining sibling in poor health. Pt has not felt depressed prior to the previous two years, and is interested in finding out options for ongoing therapy.  Duration of problem: Increase in past two years; Severity of problem: moderately severe  OBJECTIVE: Mood: Anxious and Depressed and Affect: Appropriate Risk of harm to self or others: No plan to harm self or others  LIFE CONTEXT: Family and Social: Pt lives by herself School/Work: Works part-time  Self-Care: Recognizing a greater need for self-care Life Changes: Loss of both parents(both by 78yo); loss of brother, sister, infant niece, and remaining sister in poor health, in less than two years. Increase in physical pain with fibroids, leading to decreased work hours, and concern over her own mortality.   GOALS ADDRESSED: Patient will: 1. Reduce symptoms of: anxiety, depression and stress 2. Increase knowledge and/or ability of: stress reduction  3. Demonstrate ability to: Increase healthy adjustment to current life circumstances, Increase adequate support systems for patient/family and Begin healthy grieving over loss  INTERVENTIONS: Interventions utilized:  Mindfulness or Psychologist, educational, Psychoeducation and/or Health Education and Link to [employer]  Standardized Assessments completed: GAD-7 and PHQ 9  ASSESSMENT: Patient currently experiencing Adjustment disorder with mixed anxious and depressed mood.   Patient may benefit from psychoeducation and brief therapeutic interventions regarding coping with symptoms of depression and anxiety. Marland Kitchen  PLAN: 1. Follow up with behavioral health clinician on : One week via phone mood check 2. Behavioral recommendations:  -Establish care for ongoing therapy at [facility] or [place] [place] walk-in clinics, OR make appointment with another therapist taking [employer], within one week. -Consider using self-coping strategies discussed in office visit, on a daily basis. -Read educational materials regarding coping with symptoms of depression and anxiety. -Consider hospice group grief counseling, if needed  3. Referral(s): [place] (In Clinic), [facility] (LME/Outside Clinic) and Community Resources:  [employer] 4. "From scale of 1-10, how likely are you to follow plan?": 9  Caroleen Hamman Aarush Stukey, LCSW  Depression screen [facility] 2/9 07/31/2018  Decreased Interest 2  Down, Depressed, Hopeless 2  PHQ - 2 Score 4  Altered sleeping 2  Tired, decreased energy 2  Change in appetite 2  Feeling bad or failure about yourself  3  Trouble concentrating 3  Moving slowly or fidgety/restless 1  Suicidal thoughts 0  PHQ-9 Score 17   GAD 7 : Generalized Anxiety Score 07/31/2018  Nervous, Anxious, on Edge 2  Control/stop worrying 2  Worry too much - different things 2  Trouble relaxing 2  Restless 1  Easily annoyed or irritable 1  Afraid - awful might happen 1  Total GAD 7 Score 11

## 2018-08-09 ENCOUNTER — Telehealth: Payer: Self-pay | Admitting: Clinical

## 2018-08-09 NOTE — Telephone Encounter (Signed)
Pt says she is feeling "so much better" after starting Provera, stopped bleeding 4 days ago, is no longer feeling great pain,and really appreciates our call to check on her, as well as the care she received at [facility]. Pt knows she may come back in to see [facility] if her mood symptoms escalate or do not improve.

## 2018-08-09 NOTE — Telephone Encounter (Signed)
Follow-up mood check, as agreed-upon by pt; Left HIPPA-compliant message to call back Roselyn Reef from [facility] [employer] [facility]  at [phone].

## 2018-08-24 ENCOUNTER — Encounter (HOSPITAL_COMMUNITY): Payer: Self-pay

## 2018-08-24 ENCOUNTER — Inpatient Hospital Stay (HOSPITAL_COMMUNITY)
Admission: AD | Admit: 2018-08-24 | Discharge: 2018-08-24 | Disposition: A | Discharge status: Home / Self Care | Payer: BLUE CROSS/BLUE SHIELD | Source: Ambulatory Visit | Attending: Obstetrics & Gynecology | Admitting: Obstetrics & Gynecology

## 2018-08-24 ENCOUNTER — Telehealth: Payer: Self-pay | Admitting: Clinical

## 2018-08-24 ENCOUNTER — Telehealth: Payer: Self-pay | Admitting: *Deleted

## 2018-08-24 DIAGNOSIS — D649 Anemia, unspecified: Secondary | ICD-10-CM | POA: Diagnosis not present

## 2018-08-24 DIAGNOSIS — N938 Other specified abnormal uterine and vaginal bleeding: Secondary | ICD-10-CM | POA: Insufficient documentation

## 2018-08-24 DIAGNOSIS — D259 Leiomyoma of uterus, unspecified: Secondary | ICD-10-CM | POA: Insufficient documentation

## 2018-08-24 DIAGNOSIS — Z888 Allergy status to other drugs, medicaments and biological substances status: Secondary | ICD-10-CM | POA: Insufficient documentation

## 2018-08-24 DIAGNOSIS — Z79899 Other long term (current) drug therapy: Secondary | ICD-10-CM | POA: Insufficient documentation

## 2018-08-24 DIAGNOSIS — Z87891 Personal history of nicotine dependence: Secondary | ICD-10-CM | POA: Diagnosis not present

## 2018-08-24 DIAGNOSIS — N852 Hypertrophy of uterus: Secondary | ICD-10-CM

## 2018-08-24 DIAGNOSIS — N939 Abnormal uterine and vaginal bleeding, unspecified: Secondary | ICD-10-CM | POA: Diagnosis present

## 2018-08-24 LAB — CBC
HCT: 30.3 % — ABNORMAL LOW (ref 36.0–46.0)
HEMOGLOBIN: 9.3 g/dL — AB (ref 12.0–15.0)
MCH: 27 pg (ref 26.0–34.0)
MCHC: 30.7 g/dL (ref 30.0–36.0)
MCV: 87.8 fL (ref 80.0–100.0)
NRBC: 0 % (ref 0.0–0.2)
Platelets: 363 10*3/uL (ref 150–400)
RBC: 3.45 MIL/uL — AB (ref 3.87–5.11)
RDW: 25.1 % — ABNORMAL HIGH (ref 11.5–15.5)
WBC: 7.3 10*3/uL (ref 4.0–10.5)

## 2018-08-24 MED ORDER — NAPROXEN 500 MG PO TABS: 500.0000 mg | ORAL_TABLET | Freq: Two times a day (BID) | ORAL | 1 refills | Status: DC

## 2018-08-24 MED ORDER — KETOROLAC TROMETHAMINE 60 MG/2ML IM SOLN
60.0000 mg | Freq: Once | INTRAMUSCULAR | Status: AC
  Administered 2018-08-24: 60 mg via INTRAMUSCULAR
  Filled 2018-08-24: qty 2

## 2018-08-24 NOTE — Discharge Instructions (Signed)
Dysfunctional Uterine Bleeding °Dysfunctional uterine bleeding is abnormal bleeding from the uterus. Dysfunctional uterine bleeding includes: °· A period that comes earlier or later than usual. °· A period that is lighter, heavier, or has blood clots. °· Bleeding between periods. °· Skipping one or more periods. °· Bleeding after sexual intercourse. °· Bleeding after menopause. ° °Follow these instructions at home: °Pay attention to any changes in your symptoms. Follow these instructions to help with your condition: °Eating and drinking °· Eat well-balanced meals. Include foods that are high in iron, such as liver, meat, shellfish, green leafy vegetables, and eggs. °· If you become constipated: °? Drink plenty of water. °? Eat fruits and vegetables that are high in water and fiber, such as spinach, carrots, raspberries, apples, and mango. °Medicines °· Take over-the-counter and prescription medicines only as told by your health care provider. °· Do not change medicines without talking with your health care provider. °· Aspirin or medicines that contain aspirin may make the bleeding worse. Do not take those medicines: °? During the week before your period. °? During your period. °· If you were prescribed iron pills, take them as told by your health care provider. Iron pills help to replace iron that your body loses because of this condition. °Activity °· If you need to change your sanitary pad or tampon more than one time every 2 hours: °? Lie in bed with your feet raised (elevated). °? Place a cold pack on your lower abdomen. °? Rest as much as possible until the bleeding stops or slows down. °· Do not try to lose weight until the bleeding has stopped and your blood iron level is back to normal. °Other Instructions °· For two months, write down: °? When your period starts. °? When your period ends. °? When any abnormal bleeding occurs. °? What problems you notice. °· Keep all follow up visits as told by your health  care provider. This is important. °Contact a health care provider if: °· You get light-headed or weak. °· You have nausea and vomiting. °· You cannot eat or drink without vomiting. °· You feel dizzy or have diarrhea while you are taking medicines. °· You are taking birth control pills or hormones, and you want to change them or stop taking them. °Get help right away if: °· You develop a fever or chills. °· You need to change your sanitary pad or tampon more than one time per hour. °· Your bleeding becomes heavier, or your flow contains clots more often. °· You develop pain in your abdomen. °· You lose consciousness. °· You develop a rash. °This information is not intended to replace advice given to you by your health care provider. Make sure you discuss any questions you have with your health care provider. °Document Released: 10/08/2000 Document Revised: 03/18/2016 Document Reviewed: 01/06/2015 °Elsevier Interactive Patient Education © 2018 Elsevier Inc. ° °

## 2018-08-24 NOTE — MAU Provider Note (Addendum)
History     CSN: [id number]  Arrival date and time: 08/24/18 1845   First Provider Initiated Contact with Patient 08/24/18 1947      Chief Complaint  Patient presents with  . Vaginal Bleeding   HPI Megan Chaney is 47 y.o. G0P0000 presents to MAU for evaluation of vaginal bleeding that began in August. Long hx of heavy cycles and dx of uterine fibroids in [place] 2 yrs ago. She is a patient of Dr. Jordan Hawks last seen 07/31/2018.  Dx at that visit of anemia, fibroids. U/S on 08/02/2018 Showed pattern involving the myometrium that is possibly adenomyosis, fibroids, endometrium measuring 57mm.  Hgb on 10/4 9.6 which was an increase from 7.4 on 06/30/18.  She was given Rx Provera 20mg  po qd.  Bleeding has slowed with Provera but now started back last week,,no bleeding for a few days and now back to bleeding passing large clots with pain.  Took Fe and Provera today but has not taken anything for cramping. Heating pad helps.  Reports intermittent right thigh tingling.    She has f/u appt with Dr. Ihor Dow on 08/28/2018.   Past Medical History:  Diagnosis Date  . Anemia   . Fibroid   . GERD (gastroesophageal reflux disease)     Past Surgical History:  Procedure Laterality Date  . MULTIPLE TOOTH EXTRACTIONS    . NO PAST SURGERIES      Family History  Problem Relation Age of Onset  . Chronic Renal Failure Father   . Miscarriages / Stillbirths Father   . Heart attack Mother   . Stroke Mother   . Cancer Mother   . Diabetes Mother     Social History   Tobacco Use  . Smoking status: Former Smoker    Last attempt to quit: 07/29/1999    Years since quitting: 19.0  . Smokeless tobacco: Never Used  Substance Use Topics  . Alcohol use: Yes  . Drug use: Not Currently    Types: Marijuana    Comment: Quit in July 2019    Allergies:  Allergies  Allergen Reactions  . Other Rash    Powder in latex gloves    Medications Prior to Admission  Medication Sig Dispense Refill Last Dose   . ferrous sulfate 325 (65 FE) MG tablet TAKE 1 TABLET (325 MG TOTAL) BY MOUTH 3 (THREE) TIMES DAILY WITH MEALS. 90 tablet 0 Taking  . fluticasone (FLONASE) 50 MCG/ACT nasal spray Place 2 sprays into both nostrils daily as needed for allergies.    Taking  . medroxyPROGESTERone (PROVERA) 10 MG tablet Take 2 tablets (20 mg total) by mouth daily. 30 tablet 2   . naproxen (NAPROSYN) 500 MG tablet Take 1 tablet (500 mg total) by mouth 2 (two) times daily. 30 tablet 0 Taking  . traMADol (ULTRAM) 50 MG tablet Take 1 tablet (50 mg total) by mouth every 6 (six) hours as needed. (Patient not taking: Reported on 07/31/2018) 20 tablet 0 Not Taking    Review of Systems  Constitutional: Negative for appetite change, chills and fever.  Respiratory: Negative for shortness of breath.   Cardiovascular: Negative for chest pain.  Gastrointestinal: Positive for abdominal pain (cramping in lower abd). Negative for nausea and vomiting.  Genitourinary: Positive for vaginal bleeding. Negative for dysuria, flank pain and frequency. Decreased urine volume: heavy with clots.  Musculoskeletal:       Intermittent thigh pain  Neurological: Positive for dizziness. Negative for syncope and headaches.  Psychiatric/Behavioral: The patient is not  nervous/anxious.    Physical Exam   Blood pressure (!) 128/56, pulse 96, temperature 98.2 F (36.8 C), temperature source Oral, resp. rate 18, height 5\' 3"  (1.6 m), weight 58.5 kg, last menstrual period 08/16/2018, SpO2 99 %.  Physical Exam  Nursing note and vitals reviewed. Constitutional: She is oriented to person, place, and time. She appears well-developed and well-nourished.  HENT:  Head: Normocephalic.  Neck: Normal range of motion.  Cardiovascular: Normal rate.  Respiratory: Effort normal.  GI: There is tenderness (lower bilateral tenderness R>L).  Genitourinary: There is no rash, tenderness or lesion on the right labia. There is no rash, tenderness or lesion on the left  labia. Uterus is enlarged (measures 14 week size) and tender. Right adnexum displays no tenderness. Left adnexum displays no tenderness. There is bleeding (small amount of bright red bleeding with several small clots) in the vagina.  Neurological: She is alert and oriented to person, place, and time.  Skin: Skin is warm and dry.  Psychiatric: She has a normal mood and affect. Her behavior is normal. Judgment and thought content normal.    Results for orders placed or performed during the hospital encounter of 08/24/18 (from the past 24 hour(s))  CBC     Status: Abnormal   Collection Time: 08/24/18  7:13 PM  Result Value Ref Range   WBC 7.3 4.0 - 10.5 K/uL   RBC 3.45 (L) 3.87 - 5.11 MIL/uL   Hemoglobin 9.3 (L) 12.0 - 15.0 g/dL   HCT 30.3 (L) 36.0 - 46.0 %   MCV 87.8 80.0 - 100.0 fL   MCH 27.0 26.0 - 34.0 pg   MCHC 30.7 30.0 - 36.0 g/dL   RDW 25.1 (H) 11.5 - 15.5 %   Platelets 363 150 - 400 K/uL   nRBC 0.0 0.0 - 0.2 %   MAU Course  Procedures  MDM MSE Exam Labs Toradol 60 mg IM given in MAU for cramping Care turned over to M. Abdulaziz Toman  Assessment and Plan  A:  Dysfunctional Uterine Bleeding       Fibroids       U/S suggestive of adenomyosis       Anemia--HGB stable  P:  Continue Provera and anemia as instructed by Dr. Roselie Awkward      Keep scheduled appt with Dr. Ihor Dow for 08/28/2018 Eve M Key 08/24/2018, 7:47 PM   Assumed care just prior to discharge Feels much better after Toradol Has appt tomorrow, anxious to make a plan Refilled Naproxen rx. Seabron Spates, CNM

## 2018-08-24 NOTE — Telephone Encounter (Signed)
Asked by Roselyn Reef [facility] to talk with patient who she has been talking to on phone. Patient reports she has been seen for her bleeding issues but feels it is getting worse even though she is taking her meds. States she is changing her pad every 1/2 hour to one hour and is wearing 2 pads at a time and is full and is now wearing depends.  Has been like this for 3 days. I advised her to go to [facility] for evaluation.

## 2018-08-24 NOTE — Telephone Encounter (Signed)
Pt is calling, uncertain about the time of her upcoming appointment, and not sure if she should come in earlier than Monday, 08/28/2018, after bleeding excessively with numerous blood clots. Pt is transferred to Geralyn Flash, RN for medical advice concerning bleeding and blood clots.

## 2018-08-24 NOTE — MAU Note (Signed)
Pt here for heavy vaginal bleeding. States her period started last Wednesday. States it started to slow down and then started back around Tuesday or Wednesday. States she is passing large clots-the largest size was about golf ball sized. States she wears 2 super pads at one time, changing every hour last week. Pt states a little lighter today, but is wearing a depend that she put on about one hour prior to arrival. Pt denies pain.

## 2018-08-28 ENCOUNTER — Other Ambulatory Visit: Payer: Self-pay | Admitting: Advanced Practice Midwife

## 2018-08-28 ENCOUNTER — Ambulatory Visit (INDEPENDENT_AMBULATORY_CARE_PROVIDER_SITE_OTHER): Payer: BLUE CROSS/BLUE SHIELD | Admitting: Clinical

## 2018-08-28 ENCOUNTER — Encounter: Payer: Self-pay | Admitting: Obstetrics & Gynecology

## 2018-08-28 ENCOUNTER — Other Ambulatory Visit (HOSPITAL_COMMUNITY)
Admission: RE | Admit: 2018-08-28 | Discharge: 2018-08-28 | Disposition: A | Discharge status: Home / Self Care | Payer: BLUE CROSS/BLUE SHIELD | Source: Ambulatory Visit | Attending: Obstetrics & Gynecology | Admitting: Obstetrics & Gynecology

## 2018-08-28 ENCOUNTER — Inpatient Hospital Stay: Admission: RE | Admit: 2018-08-28 | Payer: Self-pay | Source: Ambulatory Visit

## 2018-08-28 ENCOUNTER — Ambulatory Visit (INDEPENDENT_AMBULATORY_CARE_PROVIDER_SITE_OTHER): Payer: BLUE CROSS/BLUE SHIELD | Admitting: Obstetrics & Gynecology

## 2018-08-28 VITALS — BP 119/81 | HR 100 | Wt 129.8 lb

## 2018-08-28 DIAGNOSIS — N939 Abnormal uterine and vaginal bleeding, unspecified: Secondary | ICD-10-CM | POA: Insufficient documentation

## 2018-08-28 DIAGNOSIS — Z658 Other specified problems related to psychosocial circumstances: Secondary | ICD-10-CM

## 2018-08-28 DIAGNOSIS — D219 Benign neoplasm of connective and other soft tissue, unspecified: Secondary | ICD-10-CM

## 2018-08-28 DIAGNOSIS — F4323 Adjustment disorder with mixed anxiety and depressed mood: Secondary | ICD-10-CM

## 2018-08-28 LAB — POCT PREGNANCY, URINE: Preg Test, Ur: NEGATIVE

## 2018-08-28 MED ORDER — MEGESTROL ACETATE 40 MG PO TABS: 40.0000 mg | ORAL_TABLET | Freq: Two times a day (BID) | ORAL | 5 refills | Status: DC

## 2018-08-28 NOTE — Patient Instructions (Signed)
Endometrial Biopsy, Care After This sheet gives you information about how to care for yourself after your procedure. Your health care provider may also give you more specific instructions. If you have problems or questions, contact your health care provider. What can I expect after the procedure? After the procedure, it is common to have:  Mild cramping.  A small amount of vaginal bleeding for a few days. This is normal.  Follow these instructions at home:  Take over-the-counter and prescription medicines only as told by your health care provider.  Do not douche, use tampons, or have sexual intercourse until your health care provider approves.  Return to your normal activities as told by your health care provider. Ask your health care provider what activities are safe for you.  Follow instructions from your health care provider about any activity restrictions, such as restrictions on strenuous exercise or heavy lifting. Contact a health care provider if:  You have heavy bleeding, or bleed for longer than 2 days after the procedure.  You have bad smelling discharge from your vagina.  You have a fever or chills.  You have a burning sensation when urinating or you have difficulty urinating.  You have severe pain in your lower abdomen. Get help right away if:  You have severe cramps in your stomach or back.  You pass large blood clots.  Your bleeding increases.  You become weak or light-headed, or you pass out. Summary  After the procedure, it is common to have mild cramping and a small amount of vaginal bleeding for a few days.  Do not douche, use tampons, or have sexual intercourse until your health care provider approves.  Return to your normal activities as told by your health care provider. Ask your health care provider what activities are safe for you. This information is not intended to replace advice given to you by your health care provider. Make sure you discuss any  questions you have with your health care provider. Document Released: 08/01/2013 Document Revised: 10/27/2016 Document Reviewed: 10/27/2016 Elsevier Interactive Patient Education  2017 [place]. Endometrial Biopsy Endometrial biopsy is a procedure in which a tissue sample is taken from inside the uterus. The sample is taken from the endometrium, which is the lining of the uterus. The tissue sample is then checked under a microscope to see if the tissue is normal or abnormal. This procedure helps to determine where you are in your menstrual cycle and how hormone levels are affecting the lining of the uterus. This procedure may also be used to evaluate uterine bleeding or to diagnose endometrial cancer, endometrial tuberculosis, polyps, or other inflammatory conditions. Tell a health care provider about:  Any allergies you have.  All medicines you are taking, including vitamins, herbs, eye drops, creams, and over-the-counter medicines.  Any problems you or family members have had with anesthetic medicines.  Any blood disorders you have.  Any surgeries you have had.  Any medical conditions you have.  Whether you are pregnant or may be pregnant. What are the risks? Generally, this is a safe procedure. However, problems may occur, including:  Bleeding.  Pelvic infection.  Puncture of the wall of the uterus with the biopsy device (rare).  What happens before the procedure?  Keep a record of your menstrual cycles as told by your health care provider. You may need to schedule your procedure for a specific time in your cycle.  You may want to bring a sanitary pad to wear after the procedure.  Ask your health care provider about: ? Changing or stopping your regular medicines. This is especially important if you are taking diabetes medicines or blood thinners. ? Taking medicines such as aspirin and ibuprofen. These medicines can thin your blood. Do not take these medicines before your  procedure if your health care provider instructs you not to.  Plan to have someone take you home from the hospital or clinic. What happens during the procedure?  To lower your risk of infection: ? Your health care team will wash or sanitize their hands.  You will lie on an exam table with your feet and legs supported as in a pelvic exam.  Your health care provider will insert an instrument (speculum) into your vagina to see your cervix.  Your cervix will be cleansed with an antiseptic solution.  A medicine (local anesthetic) will be used to numb the cervix.  A forceps instrument (tenaculum) will be used to hold your cervix steady for the biopsy.  A thin, rod-like instrument (uterine sound) will be inserted through your cervix to determine the length of your uterus and the location where the biopsy sample will be removed.  A thin, flexible tube (catheter) will be inserted through your cervix and into the uterus. The catheter will be used to collect the biopsy sample from your endometrial tissue.  The catheter and speculum will then be removed, and the tissue sample will be sent to a lab for examination. What happens after the procedure?  You will rest in a recovery area until you are ready to go home.  You may have mild cramping and a small amount of vaginal bleeding. This is normal.  It is up to you to get the results of your procedure. Ask your health care provider, or the department that is doing the procedure, when your results will be ready. Summary  Endometrial biopsy is a procedure in which a tissue sample is taken from the endometrium, which is the lining of the uterus.  This procedure may help to diagnose menstrual cycle problems, abnormal bleeding, or other conditions affecting the endometrium.  Before the procedure, keep a record of your menstrual cycles as told by your health care provider.  The tissue sample that is removed will be checked under a microscope to see  if it is normal or abnormal. This information is not intended to replace advice given to you by your health care provider. Make sure you discuss any questions you have with your health care provider. Document Released: 02/11/2005 Document Revised: 10/27/2016 Document Reviewed: 10/27/2016 Elsevier Interactive Patient Education  2017 Elsevier Inc.  

## 2018-08-28 NOTE — BH Specialist Note (Signed)
Integrated Behavioral Health Initial Visit  MRN: [id number] Name: Megan Chaney  Number of [facility] Clinician visits:: 2/6 Session Start time: 4: 45  Session End time: 5:25 Total time: 50 minutes  Type of Service: [place] Interpretor:No. Interpretor Name and Language: n/a   Warm Hand Off Completed.       SUBJECTIVE: Megan Chaney is a 47 y.o. female accompanied by n/a Patient was referred by Lavonia Drafts, MD for emotions. Patient reports the following symptoms/concerns: Pt states it helps to talk through her emotions regarding the pain of her fibroids, along with the uncertainties of life, and financial stress.  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 Remaining sister in poor health, all in past two years. Increase in physical pain (fibroids) causing less work, and concern about mortality  GOALS ADDRESSED: Patient will: 1. Reduce symptoms of: anxiety, depression and stress 2. Increase knowledge and/or ability of: stress reduction  3. Demonstrate ability to: Increase healthy adjustment to current life circumstances and Increase adequate support systems for patient/family  INTERVENTIONS: Interventions utilized: Solution-Focused Strategies and Link to [employer]  Standardized Assessments completed: GAD-7 and PHQ 9  ASSESSMENT: Patient currently experiencing Adjustment disorder with mixed anxious and depressed mood and  Psychosocial stress.   Patient may benefit from continued brief therapeutic interventions regarding coping with symptoms of anxiety and depression .  PLAN: 1. Follow up with behavioral health clinician on : As needed 2. Behavioral recommendations:  -Continue using self-coping strategies that are working to reduce symptoms -Consider local resources available to help with small business startups, as discussed in office visit -Read educational materials regarding coping with symptoms of chronic pain (opening and closing the gateway to pain) -Consider options discussed to establish care for ongoing therapy  3. Referral(s): [place] (In Clinic) 4. "From scale of 1-10, how likely are you to follow plan?": 10  Garlan Fair, LCSW  Depression screen [facility] 2/9 08/28/2018 07/31/2018  Decreased Interest 1 2  Down, Depressed, Hopeless 2 2  PHQ - 2 Score 3 4  Altered sleeping 2 2  Tired, decreased energy 2 2  Change in appetite 2 2  Feeling bad or failure about yourself  1 3  Trouble concentrating 2 3  Moving slowly or fidgety/restless 2 1  Suicidal thoughts 0 0  PHQ-9 Score 14 17   GAD 7 : Generalized Anxiety Score 08/28/2018 07/31/2018  Nervous, Anxious, on Edge 1 2  Control/stop worrying 2 2  Worry too much - different things 2 2  Trouble relaxing 2 2  Restless 1 1  Easily annoyed or irritable 2 1  Afraid - awful might happen 3 1  Total GAD 7 Score 13 11

## 2018-08-28 NOTE — Progress Notes (Signed)
Pt with heavy bleeding and irreg cycles. G0  Pt reports that sx began since Aug 2019. The pain has been present since that time. The pain and very heavy bleeding is new. She wears pads and sometimes depends. She had been in the ED 3x for pain and bleeding.  Pt does not want to have surgery and has been depressed about becoming 'barren.'   The indications for endometrial biopsy were reviewed.   Risks of the biopsy including cramping, bleeding, infection, uterine perforation, inadequate specimen and need for additional procedures  were discussed. The patient states she understands and agrees to undergo procedure today. Consent was signed. Time out was performed. Urine HCG was negative. A sterile speculum was placed in the patient's vagina and the cervix was prepped with Betadine. A single-toothed tenaculum was placed on the anterior lip of the cervix to stabilize it. The 3 mm pipelle was introduced into the endometrial cavity without difficulty to a depth of 9cm, and a moderate amount of tissue was obtained and sent to pathology. The instruments were removed from the patient's vagina. Minimal bleeding from the cervix was noted. The patient tolerated the procedure well. Routine post-procedure instructions were given to the patient. The patient will follow up to review the results and for further management.    08/02/2018 CLINICAL DATA:  Patient with dysfunctional uterine bleeding.  EXAM: TRANSABDOMINAL AND TRANSVAGINAL ULTRASOUND OF PELVIS  TECHNIQUE: Both transabdominal and transvaginal ultrasound examinations of the pelvis were performed. Transabdominal technique was performed for global imaging of the pelvis including uterus, ovaries, adnexal regions, and pelvic cul-de-sac. It was necessary to proceed with endovaginal exam following the transabdominal exam to visualize the endometrium and adnexal structures.  COMPARISON:  None  FINDINGS: Uterus  Measurements: 11.3 x 6.4 x 8.9 cm. Within  the superior right uterine fundus there is a 5.8 x 4.4 x 5.1 cm fibroid. Within the posterior right uterine fundus there is a 2.9 x 2.3 x 3.1 cm fibroid. Within the anterior left uterine body there is a 2.5 x 1.6 x 1.9 cm fibroid. Heterogeneous shadowing demonstrated within the myometrium.  Endometrium  Thickness: 5 mm.  No focal abnormality visualized.  Right ovary  Measurements: 2.7 x 2.0 x 2.5 cm. Normal appearance/no adnexal mass.  Left ovary  Measurements: 3.5 x 1.8 x 2.3 cm. Normal appearance/no adnexal mass.  Other findings  Small amount of fluid.  IMPRESSION: 1. Heterogeneous shadowing pattern involving the myometrium raising the possibility of adenomyosis. 2. Fibroid uterus. 3. Endometrium measures 5 mm. Patient passed a blood clot during the examination. If bleeding remains unresponsive to hormonal or medical therapy, sonohysterogram should be considered for focal lesion work-up. (Ref: Radiological Reasoning: Algorithmic Workup of Abnormal Vaginal Bleeding with Endovaginal Sonography and Sonohysterography. AJR 2008; 937:T02-40)  Megace 40mg  bid  F/u via MyChart in 2 weeks if bleeding not improved F/u surg path f/u in 3 months   Ilya Ess L. Harraway-Smith, M.D., Cherlynn June

## 2018-08-28 NOTE — Progress Notes (Signed)
Pt's screening numbers are high. States she is already plugged in with Roselyn Reef and they talk periodically.

## 2018-08-29 ENCOUNTER — Encounter: Payer: Self-pay | Admitting: Obstetrics & Gynecology

## 2018-08-30 ENCOUNTER — Encounter: Payer: Self-pay | Admitting: *Deleted

## 2018-08-31 ENCOUNTER — Telehealth: Payer: Self-pay | Admitting: *Deleted

## 2018-08-31 NOTE — Telephone Encounter (Signed)
-----   Message from Lavonia Drafts, MD sent at 08/31/2018  1:47 PM EST ----- Please call pt. Her endo bx was normal.   Thx, clh-S

## 2018-08-31 NOTE — Telephone Encounter (Signed)
Per MD request I called [facility] and left a message I am calling with some non urgent information and since you are on MyChart I will send a message thru [place]. If you have questions, please call us back or send message via [place].

## 2018-09-20 ENCOUNTER — Other Ambulatory Visit: Payer: Self-pay | Admitting: Advanced Practice Midwife

## 2018-10-26 ENCOUNTER — Other Ambulatory Visit: Payer: Self-pay | Admitting: Advanced Practice Midwife

## 2018-10-30 ENCOUNTER — Other Ambulatory Visit (HOSPITAL_COMMUNITY): Payer: Self-pay | Admitting: Advanced Practice Midwife

## 2018-11-08 ENCOUNTER — Other Ambulatory Visit: Payer: Self-pay

## 2018-11-08 DIAGNOSIS — N939 Abnormal uterine and vaginal bleeding, unspecified: Secondary | ICD-10-CM

## 2018-11-08 NOTE — Progress Notes (Signed)
[facility] sent in a refill for Ferrous Sulfate 325mg  tablets, Per Dr. Hardin Negus pt needs to be seen in our office for a CBC, Added future orders for a CBC and called and LVM for patient to call our office to be seen.

## 2018-12-19 ENCOUNTER — Other Ambulatory Visit: Payer: Self-pay | Admitting: Advanced Practice Midwife

## 2019-01-14 ENCOUNTER — Other Ambulatory Visit: Payer: Self-pay | Admitting: Advanced Practice Midwife

## 2019-07-20 ENCOUNTER — Emergency Department (HOSPITAL_COMMUNITY)
Admission: EM | Admit: 2019-07-20 | Discharge: 2019-07-20 | Disposition: A | Discharge status: Home / Self Care | Payer: BLUE CROSS/BLUE SHIELD | Attending: Emergency Medicine | Admitting: Emergency Medicine

## 2019-07-20 ENCOUNTER — Encounter (HOSPITAL_COMMUNITY): Payer: Self-pay | Admitting: Emergency Medicine

## 2019-07-20 ENCOUNTER — Other Ambulatory Visit: Payer: Self-pay

## 2019-07-20 DIAGNOSIS — N939 Abnormal uterine and vaginal bleeding, unspecified: Secondary | ICD-10-CM | POA: Insufficient documentation

## 2019-07-20 DIAGNOSIS — Z79899 Other long term (current) drug therapy: Secondary | ICD-10-CM | POA: Insufficient documentation

## 2019-07-20 DIAGNOSIS — Z87891 Personal history of nicotine dependence: Secondary | ICD-10-CM | POA: Diagnosis not present

## 2019-07-20 LAB — BASIC METABOLIC PANEL
Anion gap: 8 (ref 5–15)
BUN: 17 mg/dL (ref 6–20)
CO2: 24 mmol/L (ref 22–32)
Calcium: 8.6 mg/dL — ABNORMAL LOW (ref 8.9–10.3)
Chloride: 108 mmol/L (ref 98–111)
Creatinine, Ser: 0.68 mg/dL (ref 0.44–1.00)
GFR calc Af Amer: >60 mL/min (ref >60)
GFR calc non Af Amer: >60 mL/min (ref >60)
Glucose, Bld: 122 mg/dL — ABNORMAL HIGH (ref 70–99)
Potassium: 3.6 mmol/L (ref 3.5–5.1)
Sodium: 140 mmol/L (ref 135–145)

## 2019-07-20 LAB — CBC WITH DIFFERENTIAL/PLATELET
Abs Immature Granulocytes: 0.07 10*3/uL (ref 0.00–0.07)
Basophils Absolute: 0 10*3/uL (ref 0.0–0.1)
Basophils Relative: 0 %
Eosinophils Absolute: 0.1 10*3/uL (ref 0.0–0.5)
Eosinophils Relative: 1 %
HCT: 28.4 % — ABNORMAL LOW (ref 36.0–46.0)
Hemoglobin: 9.1 g/dL — ABNORMAL LOW (ref 12.0–15.0)
Immature Granulocytes: 1 %
Lymphocytes Relative: 19 %
Lymphs Abs: 2.4 10*3/uL (ref 0.7–4.0)
MCH: 29.5 pg (ref 26.0–34.0)
MCHC: 32 g/dL (ref 30.0–36.0)
MCV: 92.2 fL (ref 80.0–100.0)
Monocytes Absolute: 0.7 10*3/uL (ref 0.1–1.0)
Monocytes Relative: 5 %
Neutro Abs: 9.3 10*3/uL — ABNORMAL HIGH (ref 1.7–7.7)
Neutrophils Relative %: 74 %
Platelets: 282 10*3/uL (ref 150–400)
RBC: 3.08 MIL/uL — ABNORMAL LOW (ref 3.87–5.11)
RDW: 14.8 % (ref 11.5–15.5)
WBC: 12.6 10*3/uL — ABNORMAL HIGH (ref 4.0–10.5)
nRBC: 0 % (ref 0.0–0.2)

## 2019-07-20 LAB — WET PREP, GENITAL
Clue Cells Wet Prep HPF POC: NONE SEEN
Sperm: NONE SEEN
Trich, Wet Prep: NONE SEEN
Yeast Wet Prep HPF POC: NONE SEEN

## 2019-07-20 LAB — I-STAT BETA HCG BLOOD, ED (MC, WL, AP ONLY): I-stat hCG, quantitative: <5 m[IU]/mL (ref <5)

## 2019-07-20 MED ORDER — MEGESTROL ACETATE 40 MG PO TABS: 40.0000 mg | ORAL_TABLET | Freq: Two times a day (BID) | ORAL | 0 refills | Status: AC

## 2019-07-20 MED ORDER — MEGESTROL ACETATE 40 MG PO TABS
80.0000 mg | ORAL_TABLET | Freq: Every day | ORAL | Status: DC
  Administered 2019-07-20: 80 mg via ORAL
  Filled 2019-07-20: qty 2

## 2019-07-20 MED ORDER — MEGESTROL ACETATE 40 MG PO TABS
40.0000 mg | ORAL_TABLET | Freq: Every day | ORAL | Status: DC
  Filled 2019-07-20: qty 1

## 2019-07-20 NOTE — ED Triage Notes (Signed)
Pt BIB [facility] from home. Pt complaining of vaginal bleeding x 4 days. Pt reports taking a medication to thin her uterine wall and ran out of the medication 4 days ago when the bleeding started. Reporting heavy clots this morning. History of anemia. VSS. NAD.

## 2019-07-20 NOTE — ED Provider Notes (Signed)
[facility]GENC[facility] DEPARTMENT Provider Note   CSN: [id number] Arrival date & time: 07/20/19  0847     History   Chief Complaint Chief Complaint  Patient presents with  . Vaginal Bleeding    HPI Megan Chaney is a 48 y.o. female.     48 year old female with past medical history of anemia, fibroids, abnormal uterine bleeding, followed by OB/GYN and maintained on Megace presents emergency department for vaginal bleeding.  Reports that she ran out of her Megace about 1 week ago and started to have heavy vaginal bleeding about 4 days ago.  Reports that she is having large clots and is bleeding through several pads.  Reports that this is the same as it had been prior to starting the Megace.  Had endometrial biopsy which was normal and November.  Denies any pain, vaginal discharge, fever, chills.  Reports that she felt like she was getting sweaty at home and was afraid that she had lost too much blood so she called 911.  Denies any syncope.     Past Medical History:  Diagnosis Date  . Anemia   . Fibroid   . GERD (gastroesophageal reflux disease)     Patient Active Problem List   Diagnosis Date Noted  . Enlarged uterus 07/31/2018  . Uterine leiomyoma 07/31/2018  . DUB (dysfunctional uterine bleeding) 07/31/2018    Past Surgical History:  Procedure Laterality Date  . MULTIPLE TOOTH EXTRACTIONS    . NO PAST SURGERIES       OB History    Gravida  0   Para  0   Term  0   Preterm  0   AB  0   Living  0     SAB  0   TAB  0   Ectopic  0   Multiple  0   Live Births  0            Home Medications    Prior to Admission medications   Medication Sig Start Date End Date Taking? Authorizing Provider  ferrous sulfate 325 (65 FE) MG tablet TAKE 1 TABLET (325 MG TOTAL) BY MOUTH 3 (THREE) TIMES DAILY WITH MEALS. 12/20/18   Marcille Buffy D, CNM  fluticasone (FLONASE) 50 MCG/ACT nasal spray Place 2 sprays into both nostrils daily as needed  for allergies.     [provider]  medroxyPROGESTERone (PROVERA) 10 MG tablet Take 2 tablets (20 mg total) by mouth daily. 07/31/18   Woodroe Mode, MD  megestrol (MEGACE) 40 MG tablet Take 1 tablet (40 mg total) by mouth 2 (two) times daily. 07/20/19 08/19/19  Madilyn Hook A, PA-C  naproxen (NAPROSYN) 500 MG tablet TAKE 1 TABLET (500 MG TOTAL) BY MOUTH 2 (TWO) TIMES DAILY WITH A MEAL. 01/15/19   Seabron Spates, CNM  traMADol (ULTRAM) 50 MG tablet Take 1 tablet (50 mg total) by mouth every 6 (six) hours as needed. Patient not taking: Reported on 07/31/2018 07/28/18 07/28/19  Laury Deep, CNM    Family History Family History  Problem Relation Age of Onset  . Chronic Renal Failure Father   . Miscarriages / Stillbirths Father   . Heart attack Mother   . Stroke Mother   . Cancer Mother   . Diabetes Mother     Social History Social History   Tobacco Use  . Smoking status: Former Smoker    Quit date: 07/29/1999    Years since quitting: 19.9  . Smokeless tobacco: Never Used  Substance Use Topics  . Alcohol use: Yes  . Drug use: Not Currently    Types: Marijuana    Comment: Quit in July 2019     Allergies   Other   Review of Systems Review of Systems  Constitutional: Positive for diaphoresis. Negative for appetite change, chills and fever.  Respiratory: Negative for cough and shortness of breath.   Cardiovascular: Negative for chest pain.  Gastrointestinal: Negative for abdominal pain, nausea and vomiting.  Genitourinary: Positive for menstrual problem and vaginal bleeding. Negative for decreased urine volume, difficulty urinating, dyspareunia, dysuria, frequency, genital sores, hematuria, urgency, vaginal discharge and vaginal pain.  Neurological: Negative for dizziness, syncope, weakness, light-headedness and headaches.  All other systems reviewed and are negative.    Physical Exam Updated Vital Signs BP 105/69 (BP Location: Right Arm)   Pulse 95   Temp  (!) 97.4 F (36.3 C) (Oral)   Resp 16   Ht 5\' 3"  (1.6 m)   Wt 72.6 kg   SpO2 100%   BMI 28.34 kg/m   Physical Exam Vitals signs and nursing note reviewed. Exam conducted with a chaperone present.  Constitutional:      Appearance: Normal appearance.  HENT:     Head: Normocephalic.     Mouth/Throat:     Mouth: Mucous membranes are moist.  Eyes:     Conjunctiva/sclera: Conjunctivae normal.  Cardiovascular:     Rate and Rhythm: Normal rate and regular rhythm.  Pulmonary:     Effort: Pulmonary effort is normal.  Abdominal:     General: Abdomen is flat.     Tenderness: There is no abdominal tenderness.  Genitourinary:    General: Normal vulva.     Labia:        Right: No rash.        Left: No rash.      Vagina: No vaginal discharge.     Cervix: Cervical bleeding present. No cervical motion tenderness, friability or eversion.     Adnexa: Right adnexa normal and left adnexa normal.  Skin:    General: Skin is dry.  Neurological:     Mental Status: She is alert.  Psychiatric:        Mood and Affect: Mood normal.      ED Treatments / Results  Labs (all labs ordered are listed, but only abnormal results are displayed) Labs Reviewed  WET PREP, GENITAL - Abnormal; Notable for the following components:      Result Value   WBC, Wet Prep HPF POC FEW (*)    All other components within normal limits  BASIC METABOLIC PANEL - Abnormal; Notable for the following components:   Glucose, Bld 122 (*)    Calcium 8.6 (*)    All other components within normal limits  CBC WITH DIFFERENTIAL/PLATELET - Abnormal; Notable for the following components:   WBC 12.6 (*)    RBC 3.08 (*)    Hemoglobin 9.1 (*)    HCT 28.4 (*)    Neutro Abs 9.3 (*)    All other components within normal limits  URINALYSIS, ROUTINE W REFLEX MICROSCOPIC  PREGNANCY, URINE  I-STAT BETA HCG BLOOD, ED ([facility], [facility], [facility] ONLY)  GC/CHLAMYDIA PROBE AMP ([place]) NOT AT [facility]    EKG None  Radiology No results found.   Procedures Procedures (including critical care time)  Medications Ordered in ED Medications  megestrol (MEGACE) tablet 80 mg (has no administration in time range)     Initial Impression / Assessment and Plan /  ED Course  I have reviewed the triage vital signs and the nursing notes.  Pertinent labs & imaging results that were available during my care of the patient were reviewed by me and considered in my medical decision making (see chart for details).  Clinical Course as of Jul 20 1119  Fri Jul 20, 2019  1014 Work-up revealing a hemoglobin of 9.1 which is consistent with her hemoglobins in the past.  History of anemia.  Unremarkable physical exam.  Case discussed with Dr. Ronnald Nian.  Will start patient back on Megace and have her follow-up with OB/GYN.   [KM]    Clinical Course User Index [KM] Alveria Apley, PA-C       Based on review of vitals, medical screening exam, lab work and/or imaging, there does not appear to be an acute, emergent etiology for the patient's symptoms. Counseled pt on good return precautions and encouraged both PCP and ED follow-up as needed.  Prior to discharge, I also discussed incidental imaging findings with patient in detail and advised appropriate, recommended follow-up in detail.  Clinical Impression: 1. Vaginal bleeding     Disposition: Discharge  Prior to providing a prescription for a controlled substance, I independently reviewed the patient's recent prescription history on the [place]. The patient had no recent or regular prescriptions and was deemed appropriate for a brief, less than 3 day prescription of narcotic for acute analgesia.  This note was prepared with assistance of Systems analyst. Occasional wrong-word or sound-a-like substitutions may have occurred due to the inherent limitations of voice recognition software.   Final Clinical Impressions(s) / ED Diagnoses    Final diagnoses:  Vaginal bleeding    ED Discharge Orders         Ordered    megestrol (MEGACE) 40 MG tablet  2 times daily     07/20/19 1120           Kristine Royal 07/20/19 [place], Natalia, DO 07/20/19 1720

## 2019-07-20 NOTE — Discharge Instructions (Signed)
Please follow up with obgyn. If you cannot get an appointment scheduled they have a [place] 4pm-7:30pm.  Thank you for allowing me to care for you today. Please return to the emergency department if you have new or worsening symptoms. Take your medications as instructed.

## 2019-07-21 LAB — CERVICOVAGINAL ANCILLARY ONLY
Chlamydia: NEGATIVE
Neisseria Gonorrhea: NEGATIVE

## 2019-09-06 ENCOUNTER — Telehealth: Payer: Self-pay | Admitting: Obstetrics & Gynecology

## 2019-09-06 MED ORDER — MEGESTROL ACETATE 40 MG PO TABS: 40.0000 mg | ORAL_TABLET | Freq: Two times a day (BID) | ORAL | 0 refills | Status: DC

## 2019-09-06 NOTE — Telephone Encounter (Signed)
Spoke with Dr. Roselie Awkward who approved refill of Megase. Order sent.   Called pt to let her know that we have sent a refill on her prescription to the Pharmacy. Pt did not answer. LM on her voicemail that refill sent.

## 2019-09-06 NOTE — Telephone Encounter (Signed)
Patient is requesting a refill on Megestrol 40 mg, patient have an appointment scheduled with Dr.Arnold on 12-2, patient state she need a refill to last until that appointment,   [facility] [address]

## 2019-09-26 ENCOUNTER — Ambulatory Visit: Payer: BLUE CROSS/BLUE SHIELD | Admitting: Obstetrics & Gynecology

## 2019-10-01 ENCOUNTER — Encounter: Payer: Self-pay | Admitting: *Deleted

## 2019-10-04 ENCOUNTER — Other Ambulatory Visit: Payer: Self-pay

## 2019-10-04 ENCOUNTER — Encounter: Payer: Self-pay | Admitting: Family Medicine

## 2019-10-04 ENCOUNTER — Ambulatory Visit (INDEPENDENT_AMBULATORY_CARE_PROVIDER_SITE_OTHER): Payer: BLUE CROSS/BLUE SHIELD | Admitting: Family Medicine

## 2019-10-04 VITALS — BP 135/86 | HR 89 | Wt 153.2 lb

## 2019-10-04 DIAGNOSIS — D500 Iron deficiency anemia secondary to blood loss (chronic): Secondary | ICD-10-CM

## 2019-10-04 DIAGNOSIS — Z23 Encounter for immunization: Secondary | ICD-10-CM | POA: Diagnosis not present

## 2019-10-04 DIAGNOSIS — Z1151 Encounter for screening for human papillomavirus (HPV): Secondary | ICD-10-CM

## 2019-10-04 DIAGNOSIS — Z01411 Encounter for gynecological examination (general) (routine) with abnormal findings: Secondary | ICD-10-CM | POA: Diagnosis not present

## 2019-10-04 DIAGNOSIS — N938 Other specified abnormal uterine and vaginal bleeding: Secondary | ICD-10-CM

## 2019-10-04 DIAGNOSIS — Z124 Encounter for screening for malignant neoplasm of cervix: Secondary | ICD-10-CM | POA: Diagnosis not present

## 2019-10-04 DIAGNOSIS — D259 Leiomyoma of uterus, unspecified: Secondary | ICD-10-CM

## 2019-10-04 DIAGNOSIS — Z1231 Encounter for screening mammogram for malignant neoplasm of breast: Secondary | ICD-10-CM

## 2019-10-04 DIAGNOSIS — Z3169 Encounter for other general counseling and advice on procreation: Secondary | ICD-10-CM | POA: Insufficient documentation

## 2019-10-04 MED ORDER — MEGESTROL ACETATE 40 MG PO TABS: 40.0000 mg | ORAL_TABLET | Freq: Two times a day (BID) | ORAL | 0 refills | Status: DC

## 2019-10-04 MED ORDER — FERROUS SULFATE 325 (65 FE) MG PO TABS: 325.0000 mg | ORAL_TABLET | Freq: Three times a day (TID) | ORAL | 1 refills | Status: DC

## 2019-10-04 MED ORDER — NAPROXEN 500 MG PO TABS: 500.0000 mg | ORAL_TABLET | Freq: Two times a day (BID) | ORAL | 1 refills | Status: DC

## 2019-10-04 NOTE — Assessment & Plan Note (Signed)
Stable in size--discussed options which would include contraception and definitive surgery--wants fertility w/u 1st.

## 2019-10-04 NOTE — Progress Notes (Signed)
Subjective:    Patient ID: Megan Chaney is a 48 y.o. female presenting with Gynecologic Exam  on 10/04/2019  HPI: Here today for follow-up. Has h/o menorrhagia and work up which showed fibroid uterus. Status post endometrial bipsy which was benign little more than 1 year ago. Placed on Megace which worked well until she ran out in September of 2020. Went to ED, found to be anemic with hemoglobin of 9.1. Really has significant dysmenorrhea which bothers her. Interested in other treatments as she has gained weight on the Megace, which she is taking daily, but does not want anything definitive until her fertility possibility is figured out. Has h/o PID with chlamydia, long ago. No pregnancies ever. Partner (current) younger at 23 and no kids.  Review of Systems  Constitutional: Negative for chills and fever.  Respiratory: Negative for shortness of breath.   Cardiovascular: Negative for chest pain.  Gastrointestinal: Negative for abdominal pain, nausea and vomiting.  Genitourinary: Negative for dysuria.  Skin: Negative for rash.      Objective:    BP 135/86   Pulse 89   Wt 153 lb 3.2 oz (69.5 kg)   LMP 09/09/2019 (Approximate)   BMI 27.14 kg/m  Physical Exam Constitutional:      General: She is not in acute distress.    Appearance: She is well-developed.  HENT:     Head: Normocephalic and atraumatic.  Eyes:     General: No scleral icterus. Cardiovascular:     Rate and Rhythm: Normal rate.  Pulmonary:     Effort: Pulmonary effort is normal.  Abdominal:     Palpations: Abdomen is soft.  Genitourinary:    Comments: BUS normal, vagina is pink and rugated, cervix is nulliparous without lesion, uterus is firm, nodular, 12-14 wk size, no adnexal mass or tenderness.  Musculoskeletal:     Cervical back: Neck supple.  Skin:    General: Skin is warm and dry.  Neurological:     Mental Status: She is alert and oriented to person, place, and time.         Assessment & Plan:    Problem List Items Addressed This Visit      Unprioritized   Uterine leiomyoma    Stable in size--discussed options which would include contraception and definitive surgery--wants fertility w/u 1st.       Relevant Medications   naproxen (NAPROSYN) 500 MG tablet   DUB (dysfunctional uterine bleeding)    Continue Megace bid for now, new prescription written      Relevant Medications   megestrol (MEGACE) 40 MG tablet   Infertility counseling    Discussed age and likelihood of pregnancy--discussed HSG and semen analysis to start      Relevant Orders   Hysterosalpingogram (HSG) for infertility    Other Visit Diagnoses    Encounter for screening mammogram for breast cancer    -  Primary   Relevant Orders   MM 3D SCREEN BREAST BILATERAL   Mammogram Screening Routine   Screening for cervical cancer       Relevant Orders   Cytology - PAP( [facility])   Encounter for gynecological examination with abnormal finding       Relevant Orders   TSH   CBC   Comprehensive metabolic panel   Hemoglobin A1c   Lipid panel   Vitamin D (25 hydroxy)   Need for influenza vaccination       Relevant Orders   Flu Vaccine QUAD 36+ mos IM (Completed)  Need for Tdap vaccination       Relevant Orders   Tdap vaccine greater than or equal to 7yo IM (Completed)   Iron deficiency anemia due to chronic blood loss       Relevant Medications   ferrous sulfate 325 (65 FE) MG tablet     . Return in about 3 months (around 01/02/2020), or if symptoms worsen or fail to improve, for a follow-up.  Donnamae Jude 10/04/2019 1:25 PM

## 2019-10-04 NOTE — Patient Instructions (Signed)
Female Infertility  Female infertility refers to a woman's inability to get pregnant (conceive) after a year of having sex regularly (or after 6 months in women over age 46) without using birth control. Infertility can also mean that a woman is not able to carry a pregnancy to full term. Both women and men can have fertility problems. What are the causes? This condition may be caused by:  Problems with reproductive organs. Infertility can result if a woman: ? Has an abnormally short cervix or a cervix that does not remain closed during a pregnancy. ? Has a blockage or scarring in the fallopian tubes. ? Has an abnormally shaped uterus. ? Has uterine fibroids. This is a benign mass of tissue or muscle (tumor) that can develop in the uterus. ? Is not ovulating in a regular way.  Certain medical conditions. These may include: ? Polycystic ovary syndrome (PCOS). This is a hormonal disorder that can cause small cysts to grow on the ovaries. This is the most common cause of infertility in women. ? Endometriosis. This is a condition in which the tissue that lines the uterus (endometrium) grows outside of its normal location. ? Cancer and cancer treatments, such as chemotherapy or radiation. ? Premature ovarian failure. This is when ovaries stop producing eggs and hormones before age 43. ? Sexually transmitted diseases, such as chlamydia or gonorrhea. ? Autoimmune disorders. These are disorders in which the body's defense system (immune system) attacks normal, healthy cells. Infertility can be linked to more than one cause. For some women, the cause of infertility is not known (unexplained infertility). What increases the risk?  Age. A woman's fertility declines with age, especially after her mid-46s.  Being underweight or overweight.  Drinking too much alcohol.  Using drugs such as anabolic steroids, cocaine, and marijuana.  Exercising excessively.  Being exposed to environmental toxins,  such as radiation, pesticides, and certain chemicals. What are the signs or symptoms? The main sign of infertility in women is the inability to get pregnant or carry a pregnancy to full term. How is this diagnosed? This condition may be diagnosed by:  Checking whether you are ovulating each month. The tests may include: ? Blood tests to check hormone levels. ? An ultrasound of the ovaries. ? Taking a small tissue that lines the uterus and checking it under a microscope (endometrial biopsy).  Doing additional tests. This is done if ovulation is normal. Tests may include: ? Hysterosalpingography. This X-ray test can show the shape of the uterus and whether the fallopian tubes are open. ? Laparoscopy. This test uses a lighted tube (laparoscope) to look for problems in the fallopian tubes and other organs. ? Transvaginal ultrasound. This imaging test is used to check for abnormalities in the uterus and ovaries. ? Hysteroscopy. This test uses a lighted tube to check for problems in the cervix and the uterus. To be diagnosed with infertility, both partners will have a physical exam. Both partners will also have an extensive medical and sexual history taken. Additional tests may be done. How is this treated? Treatment depends on the cause of infertility. Most cases of infertility in women are treated with medicine or surgery.  Women may take medicine to: ? Correct ovulation problems. ? Treat other health conditions.  Surgery may be done to: ? Repair damage to the ovaries, fallopian tubes, cervix, or uterus. ? Remove growths from the uterus. ? Remove scar tissue from the uterus, pelvis, or other organs. Assisted reproductive technology (ART) Assisted reproductive technology (  ART) refers to all treatments and procedures that combine eggs and sperm outside the body to try to help a couple conceive. ART is often combined with fertility drugs to stimulate ovulation. Sometimes ART is done using eggs  retrieved from another woman's body (donor eggs) or from previously frozen fertilized eggs (embryos). There are different types of ART. These include:  Intrauterine insemination (IUI). A long, thin tube is used to place sperm directly into a woman's uterus. This procedure: ? Is effective for infertility caused by sperm problems, including low sperm count and low motility. ? Can be used in combination with fertility drugs.  In vitro fertilization (IVF). This is done when a woman's fallopian tubes are blocked or when a man has low sperm count. In this procedure: ? Fertility drugs are used to stimulate the ovaries to produce multiple eggs. ? Once mature, these eggs are removed from the body and combined with the sperm to be fertilized. ? The fertilized eggs are then placed into the woman's uterus. Follow these instructions at home:  Take over-the-counter and prescription medicines only as told by your health care provider.  Do not use any products that contain nicotine or tobacco, such as cigarettes and e-cigarettes. If you need help quitting, ask your health care provider.  If you drink alcohol, limit how much you have to 1 drink a day.  Make dietary changes to lose weight or maintain a healthy weight. Work with your health care provider and a dietitian to set a weight-loss goal that is healthy and reasonable for you.  Seek support from a counselor or support group to talk about your concerns related to infertility. Couples counseling may be helpful for you and your partner.  Practice stress reduction techniques that work well for you, such as regular physical activity, meditation, or deep breathing.  Keep all follow-up visits as told by your health care provider. This is important. Contact a health care provider if you:  Feel that stress is interfering with your life and relationships.  Have side effects from treatments for infertility. Summary  Female infertility refers to a woman's  inability to get pregnant (conceive) after a year of having sex regularly (or after 6 months in women over age 46) without using birth control.  To be diagnosed with infertility, both partners will have a physical exam. Both partners will also have an extensive medical and sexual history taken.  Seek support from a counselor or support group to talk about your concerns related to infertility. Couples counseling may be helpful for you and your partner. This information is not intended to replace advice given to you by your health care provider. Make sure you discuss any questions you have with your health care provider. Document Released: 10/14/2003 Document Revised: 02/01/2019 Document Reviewed: 09/12/2017 Elsevier Patient Education  2020 [employer].

## 2019-10-04 NOTE — Assessment & Plan Note (Signed)
Continue Megace bid for now, new prescription written

## 2019-10-04 NOTE — Assessment & Plan Note (Signed)
Discussed age and likelihood of pregnancy--discussed HSG and semen analysis to start

## 2019-10-05 ENCOUNTER — Other Ambulatory Visit: Payer: Self-pay | Admitting: Family Medicine

## 2019-10-05 DIAGNOSIS — E559 Vitamin D deficiency, unspecified: Secondary | ICD-10-CM

## 2019-10-05 LAB — COMPREHENSIVE METABOLIC PANEL
ALT: 12 IU/L (ref 0–32)
AST: 18 IU/L (ref 0–40)
Albumin/Globulin Ratio: 1.9 (ref 1.2–2.2)
Albumin: 4.7 g/dL (ref 3.8–4.8)
Alkaline Phosphatase: 91 IU/L (ref 39–117)
BUN/Creatinine Ratio: 22 (ref 9–23)
BUN: 15 mg/dL (ref 6–24)
Bilirubin Total: <0.2 mg/dL (ref 0.0–1.2)
CO2: 18 mmol/L — ABNORMAL LOW (ref 20–29)
Calcium: 9.6 mg/dL (ref 8.7–10.2)
Chloride: 106 mmol/L (ref 96–106)
Creatinine, Ser: 0.69 mg/dL (ref 0.57–1.00)
GFR calc Af Amer: 121 mL/min/{1.73_m2} (ref >59)
GFR calc non Af Amer: 105 mL/min/{1.73_m2} (ref >59)
Globulin, Total: 2.5 g/dL (ref 1.5–4.5)
Glucose: 80 mg/dL (ref 65–99)
Potassium: 4.3 mmol/L (ref 3.5–5.2)
Sodium: 140 mmol/L (ref 134–144)
Total Protein: 7.2 g/dL (ref 6.0–8.5)

## 2019-10-05 LAB — CBC
Hematocrit: 44.6 % (ref 34.0–46.6)
Hemoglobin: 14.4 g/dL (ref 11.1–15.9)
MCH: 28.6 pg (ref 26.6–33.0)
MCHC: 32.3 g/dL (ref 31.5–35.7)
MCV: 89 fL (ref 79–97)
Platelets: 293 10*3/uL (ref 150–450)
RBC: 5.04 x10E6/uL (ref 3.77–5.28)
RDW: 13.6 % (ref 11.7–15.4)
WBC: 6.7 10*3/uL (ref 3.4–10.8)

## 2019-10-05 LAB — TSH: TSH: 2.22 u[IU]/mL (ref 0.450–4.500)

## 2019-10-05 LAB — LIPID PANEL
Chol/HDL Ratio: 2.8 ratio (ref 0.0–4.4)
Cholesterol, Total: 157 mg/dL (ref 100–199)
HDL: 56 mg/dL (ref >39)
LDL Chol Calc (NIH): 90 mg/dL (ref 0–99)
Triglycerides: 51 mg/dL (ref 0–149)
VLDL Cholesterol Cal: 11 mg/dL (ref 5–40)

## 2019-10-05 LAB — HEMOGLOBIN A1C
Est. average glucose Bld gHb Est-mCnc: 97 mg/dL
Hgb A1c MFr Bld: 5 % (ref 4.8–5.6)

## 2019-10-05 LAB — VITAMIN D 25 HYDROXY (VIT D DEFICIENCY, FRACTURES): Vit D, 25-Hydroxy: 9.6 ng/mL — ABNORMAL LOW (ref 30.0–100.0)

## 2019-10-05 MED ORDER — VITAMIN D (ERGOCALCIFEROL) 1.25 MG (50000 UNIT) PO CAPS: 50000.0000 [IU] | ORAL_CAPSULE | ORAL | 0 refills | Status: DC

## 2019-10-05 MED ORDER — OSCAL 500/200 D-3 500-200 MG-UNIT PO TABS: 1.0000 | ORAL_TABLET | Freq: Two times a day (BID) | ORAL | 3 refills | Status: DC

## 2019-10-08 LAB — CYTOLOGY - PAP
Comment: NEGATIVE
Diagnosis: NEGATIVE
High risk HPV: NEGATIVE

## 2019-10-27 ENCOUNTER — Other Ambulatory Visit: Payer: Self-pay | Admitting: Family Medicine

## 2019-10-27 DIAGNOSIS — N938 Other specified abnormal uterine and vaginal bleeding: Secondary | ICD-10-CM

## 2019-11-29 ENCOUNTER — Other Ambulatory Visit: Payer: Self-pay | Admitting: General Practice

## 2019-11-29 DIAGNOSIS — N938 Other specified abnormal uterine and vaginal bleeding: Secondary | ICD-10-CM

## 2019-11-29 MED ORDER — MEGESTROL ACETATE 40 MG PO TABS: 40.0000 mg | ORAL_TABLET | Freq: Two times a day (BID) | ORAL | 0 refills | Status: DC

## 2019-12-26 ENCOUNTER — Other Ambulatory Visit: Payer: Self-pay | Admitting: Family Medicine

## 2019-12-26 DIAGNOSIS — N938 Other specified abnormal uterine and vaginal bleeding: Secondary | ICD-10-CM

## 2020-01-29 ENCOUNTER — Other Ambulatory Visit: Payer: Self-pay | Admitting: Family Medicine

## 2020-01-29 DIAGNOSIS — N938 Other specified abnormal uterine and vaginal bleeding: Secondary | ICD-10-CM

## 2020-01-30 ENCOUNTER — Other Ambulatory Visit: Payer: Self-pay | Admitting: Family Medicine

## 2020-01-30 DIAGNOSIS — D259 Leiomyoma of uterus, unspecified: Secondary | ICD-10-CM

## 2020-02-12 ENCOUNTER — Other Ambulatory Visit: Payer: Self-pay | Admitting: Family Medicine

## 2020-02-12 DIAGNOSIS — N938 Other specified abnormal uterine and vaginal bleeding: Secondary | ICD-10-CM

## 2020-03-30 ENCOUNTER — Other Ambulatory Visit: Payer: Self-pay | Admitting: Family Medicine

## 2020-03-30 DIAGNOSIS — D500 Iron deficiency anemia secondary to blood loss (chronic): Secondary | ICD-10-CM

## 2020-05-08 IMAGING — US US PELVIS COMPLETE TRANSABD/TRANSVAG
1 series · 15 of 25 positions shown · non-contrast
Comparison: None

CLINICAL DATA: Patient with dysfunctional uterine bleeding.

EXAM:
TRANSABDOMINAL AND TRANSVAGINAL ULTRASOUND OF PELVIS
TECHNIQUE: Both transabdominal and transvaginal ultrasound examinations of the
pelvis were performed. Transabdominal technique was performed for
global imaging of the pelvis including uterus, ovaries, adnexal
regions, and pelvic cul-de-sac. It was necessary to proceed with
endovaginal exam following the transabdominal exam to visualize the
endometrium and adnexal structures.

[Series 1: us pelvis complete transabd/transvag · 15 of 80 slices shown]
[im 1/80]
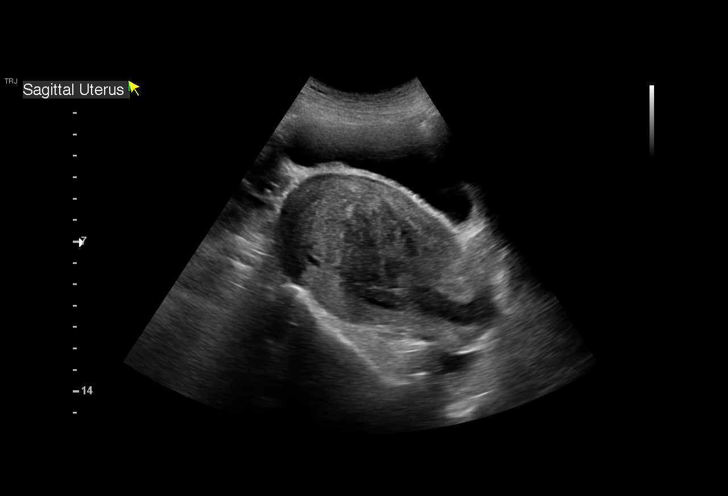
[im 7/80]
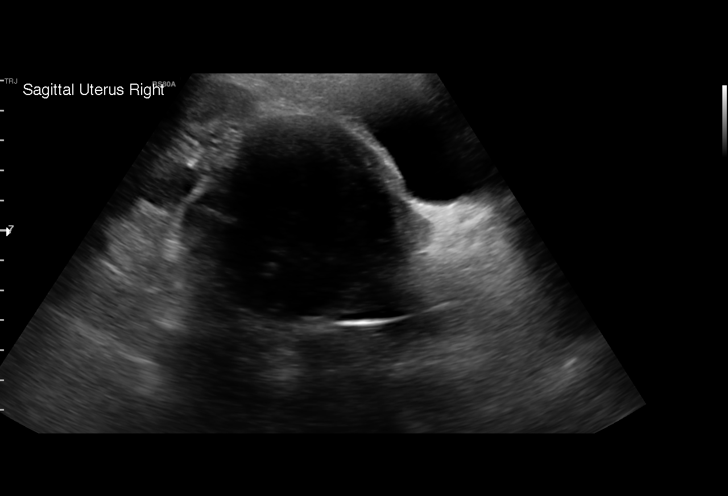
[im 14/80]
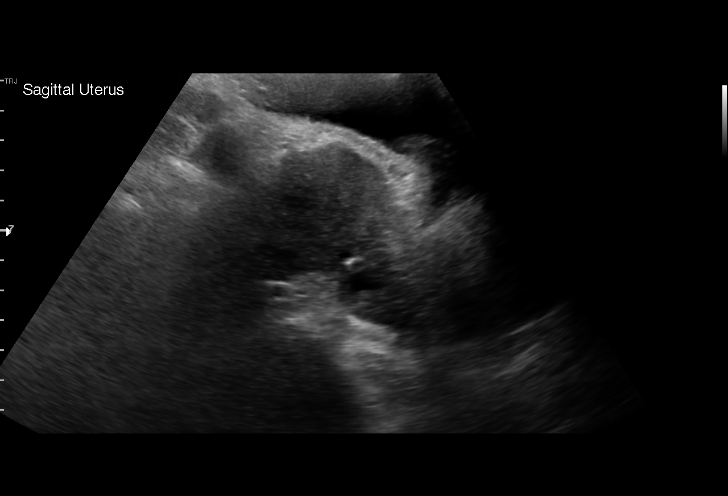
[im 17/80]
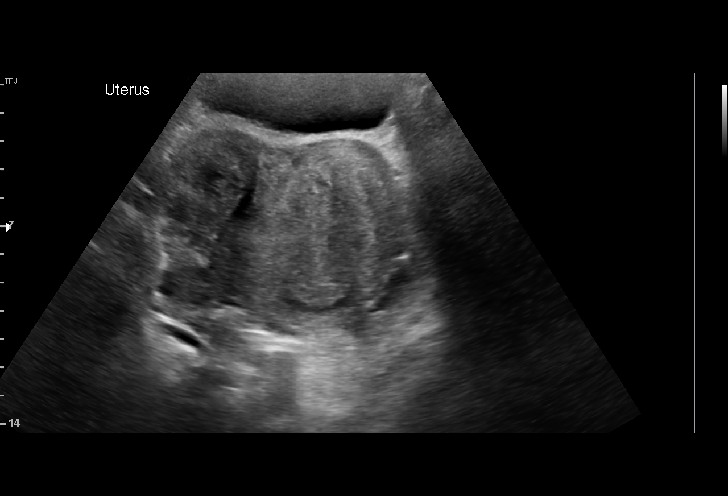
[im 24/80]
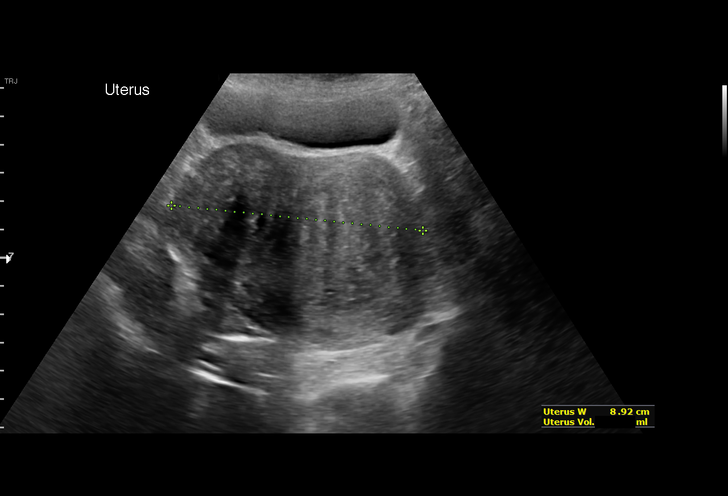
[im 30/80]
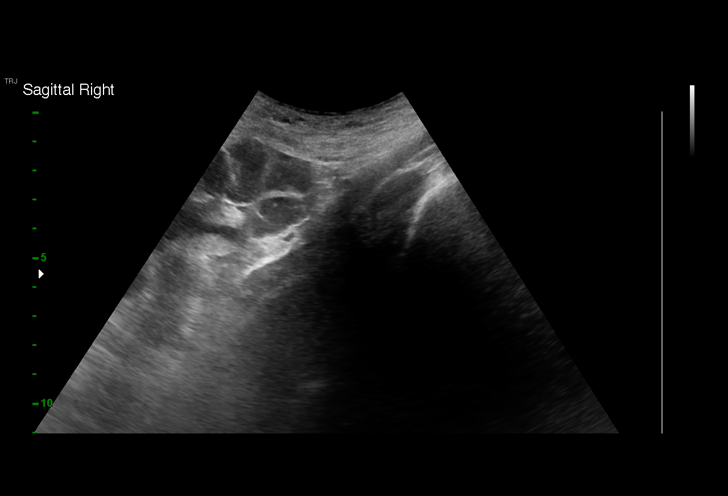
[im 33/80]
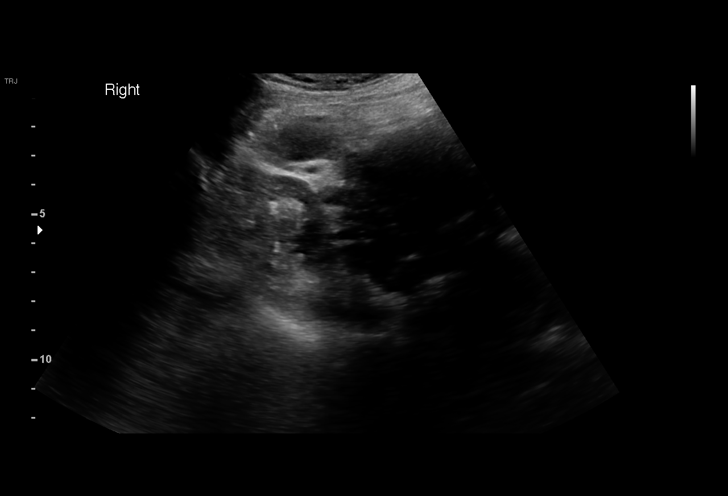
[im 40/80]
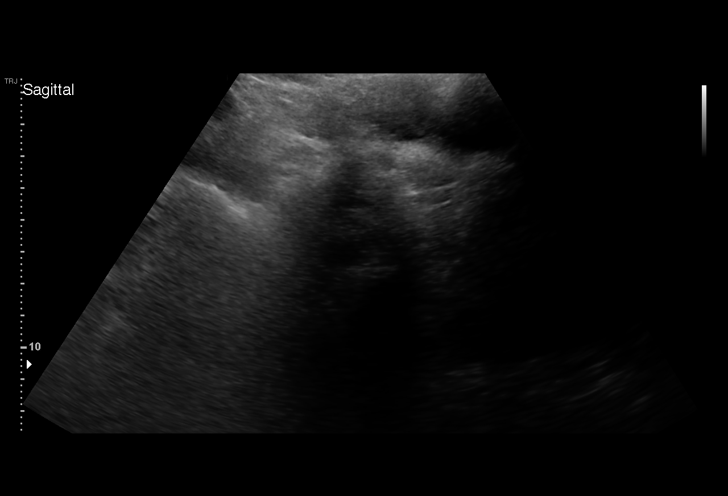
[im 47/80]
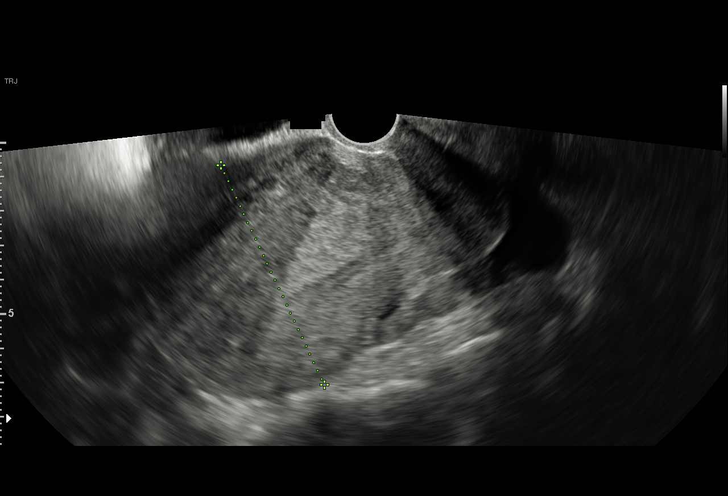
[im 50/80]
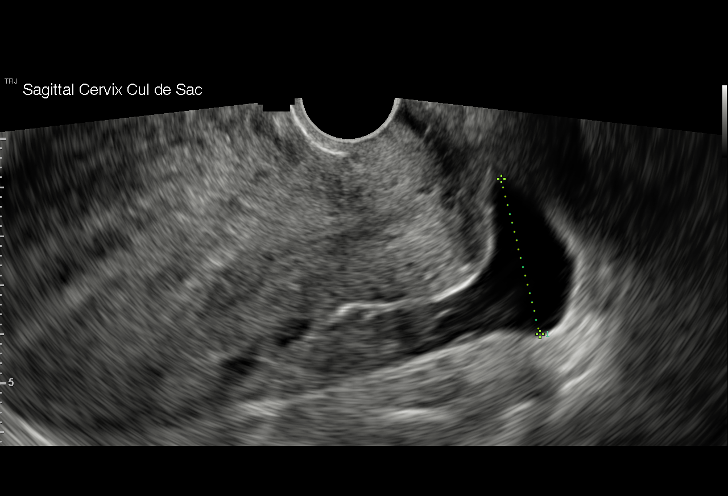
[im 56/80]
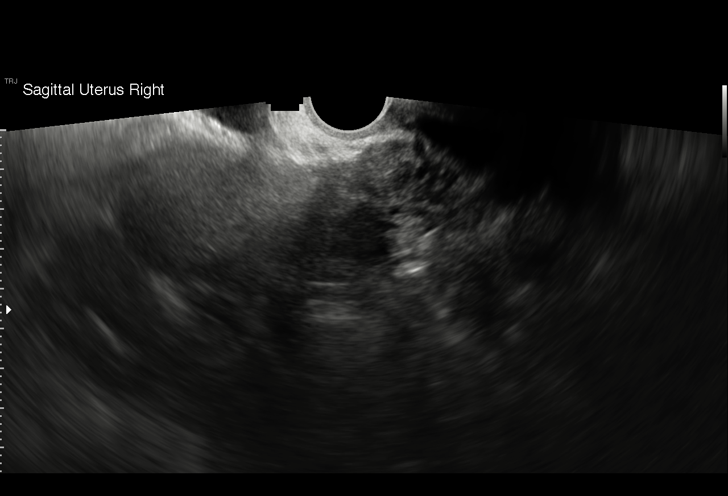
[im 63/80]
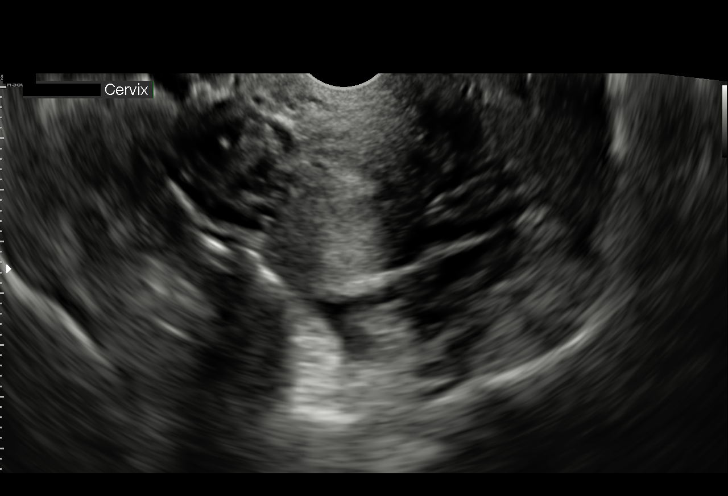
[im 66/80]
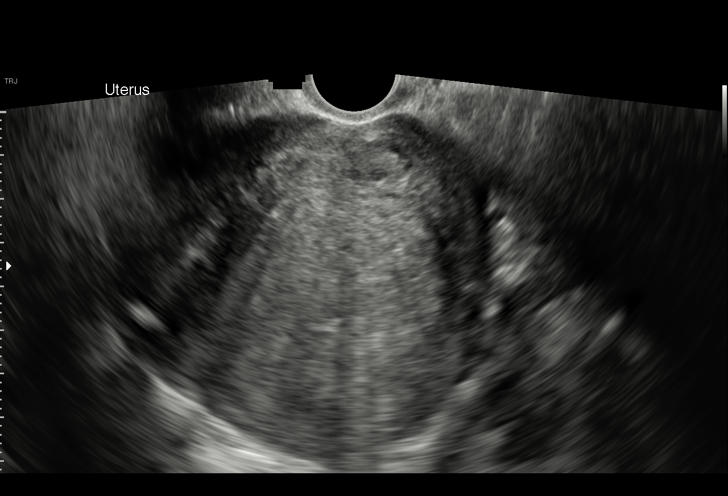
[im 73/80]
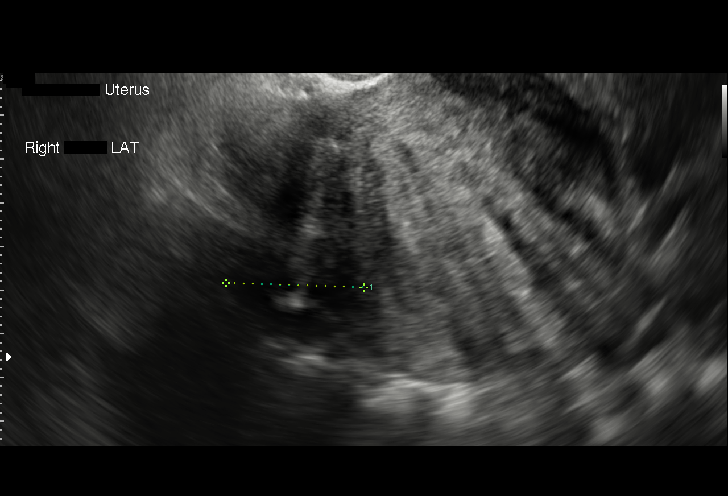
[im 80/80]
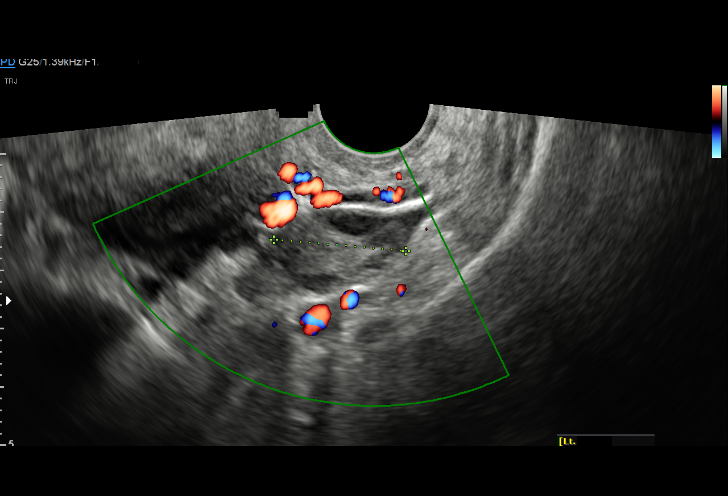

[15 of 25 positions shown; findings below may reference images not displayed]

FINDINGS: Uterus

Measurements: 11.3 x 6.4 x 8.9 cm. Within the superior right uterine
fundus there is a 5.8 x 4.4 x 5.1 cm fibroid. Within the posterior
right uterine fundus there is a 2.9 x 2.3 x 3.1 cm fibroid. Within
the anterior left uterine body there is a 2.5 x 1.6 x 1.9 cm
fibroid. Heterogeneous shadowing demonstrated within the myometrium.

Endometrium

Thickness: 5 mm.  No focal abnormality visualized.

Right ovary

Measurements: 2.7 x 2.0 x 2.5 cm. Normal appearance/no adnexal mass.

Left ovary

Measurements: 3.5 x 1.8 x 2.3 cm. Normal appearance/no adnexal mass.

Other findings

Small amount of fluid.
IMPRESSION: 1. Heterogeneous shadowing pattern involving the myometrium raising
the possibility of adenomyosis.
2. Fibroid uterus.
3. Endometrium measures 5 mm. Patient passed a blood clot during the
examination. If bleeding remains unresponsive to hormonal or medical
therapy, sonohysterogram should be considered for focal lesion
work-up. (Ref: Radiological Reasoning: Algorithmic Workup of
Abnormal Vaginal Bleeding with Endovaginal Sonography and
Sonohysterography. AJR [id number]; [id number])

## 2020-05-27 ENCOUNTER — Telehealth: Payer: Self-pay | Admitting: Family Medicine

## 2020-05-27 NOTE — Telephone Encounter (Signed)
Want to speak to a nurse about her break through bleeding

## 2020-05-27 NOTE — Telephone Encounter (Signed)
L/M for pt that I am returning her call if she continues to have questions or concerns to please give the office a call or respond via Ervin Knack, RN

## 2020-08-10 ENCOUNTER — Other Ambulatory Visit: Payer: Self-pay | Admitting: Family Medicine

## 2020-08-10 DIAGNOSIS — N938 Other specified abnormal uterine and vaginal bleeding: Secondary | ICD-10-CM

## 2021-02-18 ENCOUNTER — Ambulatory Visit (HOSPITAL_COMMUNITY)
Admission: EM | Admit: 2021-02-18 | Discharge: 2021-02-18 | Disposition: A | Discharge status: Home / Self Care | Payer: BLUE CROSS/BLUE SHIELD | Attending: Physician Assistant | Admitting: Physician Assistant

## 2021-02-18 ENCOUNTER — Encounter (HOSPITAL_COMMUNITY): Payer: Self-pay

## 2021-02-18 ENCOUNTER — Other Ambulatory Visit: Payer: Self-pay

## 2021-02-18 DIAGNOSIS — J329 Chronic sinusitis, unspecified: Secondary | ICD-10-CM

## 2021-02-18 DIAGNOSIS — J40 Bronchitis, not specified as acute or chronic: Secondary | ICD-10-CM | POA: Diagnosis not present

## 2021-02-18 DIAGNOSIS — R059 Cough, unspecified: Secondary | ICD-10-CM

## 2021-02-18 MED ORDER — CEFDINIR 300 MG PO CAPS: 300.0000 mg | ORAL_CAPSULE | Freq: Two times a day (BID) | ORAL | 0 refills | Status: DC

## 2021-02-18 MED ORDER — PROMETHAZINE-DM 6.25-15 MG/5ML PO SYRP: 5.0000 mL | ORAL_SOLUTION | Freq: Every evening | ORAL | 0 refills | Status: DC | PRN

## 2021-02-18 NOTE — ED Triage Notes (Signed)
Pt c/o being tired, headaches and nasal congestion X 1 week.

## 2021-02-18 NOTE — ED Notes (Signed)
No answer in lobby.

## 2021-02-18 NOTE — Discharge Instructions (Signed)
Take antibiotic twice daily for 1 week.  Use Promethazine DM cough syrup at night as this will make you sleepy.  You should not drive or drink alcohol while taking this medication.  Please use Mucinex and Flonase for additional symptom relief.  Make sure you rest and drink plenty of fluid.  If anything worsens return for reevaluation.

## 2021-02-18 NOTE — ED Provider Notes (Signed)
[place]    CSN: [id number] Arrival date & time: 02/18/21  1844      History   Chief Complaint Chief Complaint  Patient presents with  . Headache  . Fatigue    HPI Megan Chaney is a 50 y.o. female.   Patient presents today with a several week history of worsening productive cough.  Reports initially she assumed symptoms were related to her allergies and was taking Claritin as prescribed without improvement of symptoms.  She continues to have headache, nasal congestion, sinus pressure.  Denies any chest pain, fever, nausea, vomiting.  She does report occasional shortness of breath but states this is mild and not particularly bothersome.  She denies history of asthma or COPD.  She is a former smoker but quit approximately 20 years ago.  She denies any recent antibiotic use.  Denies any known sick contacts.  She is up-to-date on influenza and COVID-19 vaccinations but has not had booster.  She has been able to attend work despite symptoms.     Past Medical History:  Diagnosis Date  . Anemia   . Fibroid   . GERD (gastroesophageal reflux disease)     Patient Active Problem List   Diagnosis Date Noted  . Infertility counseling 10/04/2019  . Enlarged uterus 07/31/2018  . Uterine leiomyoma 07/31/2018  . DUB (dysfunctional uterine bleeding) 07/31/2018    Past Surgical History:  Procedure Laterality Date  . MULTIPLE TOOTH EXTRACTIONS    . NO PAST SURGERIES      OB History    Gravida  0   Para  0   Term  0   Preterm  0   AB  0   Living  0     SAB  0   IAB  0   Ectopic  0   Multiple  0   Live Births  0            Home Medications    Prior to Admission medications   Medication Sig Start Date End Date Taking? Authorizing Provider  cefdinir (OMNICEF) 300 MG capsule Take 1 capsule (300 mg total) by mouth 2 (two) times daily. 02/18/21  Yes Nadiah Corbit K, PA-C  promethazine-dextromethorphan (PROMETHAZINE-DM) 6.25-15 MG/5ML syrup Take 5  mLs by mouth at bedtime as needed for cough. 02/18/21  Yes Capria Cartaya K, PA-C  calcium-vitamin D (OSCAL 500/200 D-3) 500-200 MG-UNIT tablet Take 1 tablet by mouth 2 (two) times daily. 10/05/19   Donnamae Jude, MD  ferrous sulfate 325 (65 FE) MG tablet TAKE 1 TABLET (325 MG TOTAL) BY MOUTH 3 (THREE) TIMES DAILY WITH MEALS. 03/30/20   Donnamae Jude, MD  megestrol (MEGACE) 40 MG tablet TAKE 1 TABLET BY MOUTH TWICE A DAY 08/10/20   Donnamae Jude, MD  naproxen (NAPROSYN) 500 MG tablet TAKE 1 TABLET (500 MG TOTAL) BY MOUTH 2 (TWO) TIMES DAILY WITH A MEAL. 01/30/20   Donnamae Jude, MD  Vitamin D, Ergocalciferol, (DRISDOL) 1.25 MG (50000 UT) CAPS capsule Take 1 capsule (50,000 Units total) by mouth every 7 (seven) days. 10/05/19   Donnamae Jude, MD    Family History Family History  Problem Relation Age of Onset  . Chronic Renal Failure Father   . Miscarriages / Stillbirths Father   . Heart attack Mother   . Stroke Mother   . Cancer Mother   . Diabetes Mother     Social History Social History   Tobacco Use  . Smoking status: Former  Smoker    Quit date: 07/29/1999    Years since quitting: 21.5  . Smokeless tobacco: Never Used  Vaping Use  . Vaping Use: Never used  Substance Use Topics  . Alcohol use: Yes  . Drug use: Not Currently    Types: Marijuana    Comment: Quit in July 2019     Allergies   Other   Review of Systems Review of Systems  Constitutional: Positive for activity change and fatigue. Negative for appetite change and fever.  HENT: Positive for congestion and sinus pressure. Negative for sneezing and sore throat.   Respiratory: Positive for cough and shortness of breath.   Cardiovascular: Negative for chest pain.  Gastrointestinal: Negative for abdominal pain, diarrhea, nausea and vomiting.  Musculoskeletal: Negative for arthralgias and myalgias.  Neurological: Positive for headaches. Negative for dizziness and light-headedness.     Physical Exam Triage Vital  Signs ED Triage Vitals  Enc Vitals Group     BP 02/18/21 1921 (!) 153/105     Pulse Rate 02/18/21 1921 98     Resp 02/18/21 1921 17     Temp 02/18/21 1921 98 F (36.7 C)     Temp Source 02/18/21 1921 Oral     SpO2 02/18/21 1921 98 %     Weight --      Height --      Head Circumference --      Peak Flow --      Pain Score 02/18/21 1919 0     Pain Loc --      Pain Edu? --      Excl. in [place]? --    No data found.  Updated Vital Signs BP (!) 153/105 (BP Location: Right Arm)   Pulse 98   Temp 98 F (36.7 C) (Oral)   Resp 17   LMP  (LMP Unknown)   SpO2 98%   Visual Acuity Right Eye Distance:   Left Eye Distance:   Bilateral Distance:    Right Eye Near:   Left Eye Near:    Bilateral Near:     Physical Exam Vitals reviewed.  Constitutional:      General: She is awake. She is not in acute distress.    Appearance: Normal appearance. She is not ill-appearing.     Comments: Very pleasant female appears stated age in no acute distress  HENT:     Head: Normocephalic and atraumatic.     Right Ear: Ear canal and external ear normal. A middle ear effusion is present. Tympanic membrane is not erythematous or bulging.     Left Ear: Ear canal and external ear normal. A middle ear effusion is present. Tympanic membrane is not erythematous or bulging.     Nose:     Right Sinus: Maxillary sinus tenderness present. No frontal sinus tenderness.     Left Sinus: Maxillary sinus tenderness present. No frontal sinus tenderness.     Mouth/Throat:     Pharynx: Uvula midline. No oropharyngeal exudate or posterior oropharyngeal erythema.     Comments: Drainage present posterior pharynx Cardiovascular:     Rate and Rhythm: Normal rate and regular rhythm.     Heart sounds: No murmur heard.   Pulmonary:     Effort: Pulmonary effort is normal.     Breath sounds: Rhonchi present. No wheezing or rales.     Comments: Scattered rhonchi clear with cough Lymphadenopathy:     Head:     Right side  of head: No submental, submandibular  or tonsillar adenopathy.     Left side of head: No submental, submandibular or tonsillar adenopathy.     Cervical: No cervical adenopathy.  Psychiatric:        Behavior: Behavior is cooperative.      UC Treatments / Results  Labs (all labs ordered are listed, but only abnormal results are displayed) Labs Reviewed - No data to display  EKG   Radiology No results found.  Procedures Procedures (including critical care time)  Medications Ordered in UC Medications - No data to display  Initial Impression / Assessment and Plan / UC Course  I have reviewed the triage vital signs and the nursing notes.  Pertinent labs & imaging results that were available during my care of the patient were reviewed by me and considered in my medical decision making (see chart for details).     Patient started on Omnicef given prolonged and worsening symptoms.  She was prescribed Promethazine DM for nocturnal cough with instruction not to drive or drink alcohol with this medication as drowsiness is a common side effect.  Recommended she use Flonase and Mucinex for additional symptom relief.  Strict return precautions given to which patient expressed understanding.  Final Clinical Impressions(s) / UC Diagnoses   Final diagnoses:  Sinobronchitis  Cough     Discharge Instructions     Take antibiotic twice daily for 1 week.  Use Promethazine DM cough syrup at night as this will make you sleepy.  You should not drive or drink alcohol while taking this medication.  Please use Mucinex and Flonase for additional symptom relief.  Make sure you rest and drink plenty of fluid.  If anything worsens return for reevaluation.    ED Prescriptions    Medication Sig Dispense Auth. Provider   promethazine-dextromethorphan (PROMETHAZINE-DM) 6.25-15 MG/5ML syrup Take 5 mLs by mouth at bedtime as needed for cough. 118 mL Saja Bartolini K, PA-C   cefdinir (OMNICEF) 300 MG  capsule Take 1 capsule (300 mg total) by mouth 2 (two) times daily. 14 capsule Christianjames Soule K, PA-C     PDMP not reviewed this encounter.   Terrilee Croak, PA-C 02/18/21 2000

## 2021-02-27 ENCOUNTER — Other Ambulatory Visit: Payer: Self-pay | Admitting: Family Medicine

## 2021-02-27 DIAGNOSIS — N938 Other specified abnormal uterine and vaginal bleeding: Secondary | ICD-10-CM

## 2021-04-28 ENCOUNTER — Other Ambulatory Visit: Payer: Self-pay | Admitting: Internal Medicine

## 2021-04-28 DIAGNOSIS — Z1231 Encounter for screening mammogram for malignant neoplasm of breast: Secondary | ICD-10-CM

## 2021-06-19 ENCOUNTER — Other Ambulatory Visit: Payer: Self-pay

## 2021-06-19 ENCOUNTER — Ambulatory Visit: Payer: BLUE CROSS/BLUE SHIELD

## 2021-09-06 ENCOUNTER — Other Ambulatory Visit: Payer: Self-pay | Admitting: Family Medicine

## 2021-09-06 DIAGNOSIS — N938 Other specified abnormal uterine and vaginal bleeding: Secondary | ICD-10-CM

## 2021-10-26 ENCOUNTER — Inpatient Hospital Stay (HOSPITAL_COMMUNITY): Payer: BLUE CROSS/BLUE SHIELD

## 2021-10-26 ENCOUNTER — Emergency Department (HOSPITAL_COMMUNITY): Payer: BLUE CROSS/BLUE SHIELD

## 2021-10-26 ENCOUNTER — Inpatient Hospital Stay (HOSPITAL_COMMUNITY)
Admission: EM | Admit: 2021-10-26 | Discharge: 2021-11-03 | DRG: 041 | Disposition: A | Discharge status: Rehabilitation Facility | Payer: BLUE CROSS/BLUE SHIELD | Attending: Internal Medicine | Admitting: Internal Medicine

## 2021-10-26 ENCOUNTER — Other Ambulatory Visit: Payer: Self-pay

## 2021-10-26 ENCOUNTER — Encounter (HOSPITAL_COMMUNITY): Payer: Self-pay | Admitting: Emergency Medicine

## 2021-10-26 DIAGNOSIS — D259 Leiomyoma of uterus, unspecified: Secondary | ICD-10-CM | POA: Diagnosis present

## 2021-10-26 DIAGNOSIS — T82868A Thrombosis of vascular prosthetic devices, implants and grafts, initial encounter: Secondary | ICD-10-CM | POA: Diagnosis not present

## 2021-10-26 DIAGNOSIS — L03113 Cellulitis of right upper limb: Secondary | ICD-10-CM | POA: Diagnosis not present

## 2021-10-26 DIAGNOSIS — R29810 Facial weakness: Secondary | ICD-10-CM | POA: Diagnosis present

## 2021-10-26 DIAGNOSIS — K219 Gastro-esophageal reflux disease without esophagitis: Secondary | ICD-10-CM | POA: Diagnosis present

## 2021-10-26 DIAGNOSIS — I82621 Acute embolism and thrombosis of deep veins of right upper extremity: Secondary | ICD-10-CM | POA: Diagnosis not present

## 2021-10-26 DIAGNOSIS — Z20822 Contact with and (suspected) exposure to covid-19: Secondary | ICD-10-CM | POA: Diagnosis present

## 2021-10-26 DIAGNOSIS — M7989 Other specified soft tissue disorders: Secondary | ICD-10-CM | POA: Diagnosis not present

## 2021-10-26 DIAGNOSIS — D500 Iron deficiency anemia secondary to blood loss (chronic): Secondary | ICD-10-CM | POA: Diagnosis present

## 2021-10-26 DIAGNOSIS — R29720 NIHSS score 20: Secondary | ICD-10-CM | POA: Diagnosis present

## 2021-10-26 DIAGNOSIS — Q2112 Patent foramen ovale: Secondary | ICD-10-CM

## 2021-10-26 DIAGNOSIS — Z823 Family history of stroke: Secondary | ICD-10-CM | POA: Diagnosis not present

## 2021-10-26 DIAGNOSIS — I639 Cerebral infarction, unspecified: Secondary | ICD-10-CM | POA: Diagnosis present

## 2021-10-26 DIAGNOSIS — N920 Excessive and frequent menstruation with regular cycle: Secondary | ICD-10-CM | POA: Diagnosis present

## 2021-10-26 DIAGNOSIS — I82611 Acute embolism and thrombosis of superficial veins of right upper extremity: Secondary | ICD-10-CM | POA: Diagnosis present

## 2021-10-26 DIAGNOSIS — G8191 Hemiplegia, unspecified affecting right dominant side: Secondary | ICD-10-CM | POA: Diagnosis present

## 2021-10-26 DIAGNOSIS — I63412 Cerebral infarction due to embolism of left middle cerebral artery: Principal | ICD-10-CM | POA: Diagnosis present

## 2021-10-26 DIAGNOSIS — L538 Other specified erythematous conditions: Secondary | ICD-10-CM | POA: Diagnosis not present

## 2021-10-26 DIAGNOSIS — E785 Hyperlipidemia, unspecified: Secondary | ICD-10-CM | POA: Diagnosis present

## 2021-10-26 DIAGNOSIS — R4701 Aphasia: Secondary | ICD-10-CM | POA: Diagnosis present

## 2021-10-26 DIAGNOSIS — F17290 Nicotine dependence, other tobacco product, uncomplicated: Secondary | ICD-10-CM | POA: Diagnosis present

## 2021-10-26 DIAGNOSIS — I10 Essential (primary) hypertension: Secondary | ICD-10-CM | POA: Diagnosis present

## 2021-10-26 DIAGNOSIS — Z79899 Other long term (current) drug therapy: Secondary | ICD-10-CM

## 2021-10-26 DIAGNOSIS — Z8673 Personal history of transient ischemic attack (TIA), and cerebral infarction without residual deficits: Secondary | ICD-10-CM

## 2021-10-26 DIAGNOSIS — I6389 Other cerebral infarction: Secondary | ICD-10-CM | POA: Diagnosis not present

## 2021-10-26 DIAGNOSIS — Y828 Other medical devices associated with adverse incidents: Secondary | ICD-10-CM | POA: Diagnosis not present

## 2021-10-26 DIAGNOSIS — Z91040 Latex allergy status: Secondary | ICD-10-CM

## 2021-10-26 DIAGNOSIS — M79601 Pain in right arm: Secondary | ICD-10-CM | POA: Diagnosis not present

## 2021-10-26 LAB — COMPREHENSIVE METABOLIC PANEL
ALT: 25 U/L (ref 0–44)
AST: 28 U/L (ref 15–41)
Albumin: 3.7 g/dL (ref 3.5–5.0)
Alkaline Phosphatase: 57 U/L (ref 38–126)
Anion gap: 10 (ref 5–15)
BUN: 15 mg/dL (ref 6–20)
CO2: 21 mmol/L — ABNORMAL LOW (ref 22–32)
Calcium: 8.8 mg/dL — ABNORMAL LOW (ref 8.9–10.3)
Chloride: 107 mmol/L (ref 98–111)
Creatinine, Ser: 0.64 mg/dL (ref 0.44–1.00)
GFR, Estimated: >60 mL/min (ref >60)
Glucose, Bld: 118 mg/dL — ABNORMAL HIGH (ref 70–99)
Potassium: 4 mmol/L (ref 3.5–5.1)
Sodium: 138 mmol/L (ref 135–145)
Total Bilirubin: 0.4 mg/dL (ref 0.3–1.2)
Total Protein: 6.5 g/dL (ref 6.5–8.1)

## 2021-10-26 LAB — DIFFERENTIAL
Abs Immature Granulocytes: 0.05 10*3/uL (ref 0.00–0.07)
Basophils Absolute: 0.1 10*3/uL (ref 0.0–0.1)
Basophils Relative: 0 %
Eosinophils Absolute: 0 10*3/uL (ref 0.0–0.5)
Eosinophils Relative: 0 %
Immature Granulocytes: 0 %
Lymphocytes Relative: 14 %
Lymphs Abs: 1.9 10*3/uL (ref 0.7–4.0)
Monocytes Absolute: 0.8 10*3/uL (ref 0.1–1.0)
Monocytes Relative: 5 %
Neutro Abs: 11.2 10*3/uL — ABNORMAL HIGH (ref 1.7–7.7)
Neutrophils Relative %: 81 %

## 2021-10-26 LAB — CBC
HCT: 24.2 % — ABNORMAL LOW (ref 36.0–46.0)
Hemoglobin: 7.2 g/dL — ABNORMAL LOW (ref 12.0–15.0)
MCH: 25.4 pg — ABNORMAL LOW (ref 26.0–34.0)
MCHC: 29.8 g/dL — ABNORMAL LOW (ref 30.0–36.0)
MCV: 85.5 fL (ref 80.0–100.0)
Platelets: 457 10*3/uL — ABNORMAL HIGH (ref 150–400)
RBC: 2.83 MIL/uL — ABNORMAL LOW (ref 3.87–5.11)
RDW: 16.5 % — ABNORMAL HIGH (ref 11.5–15.5)
WBC: 14 10*3/uL — ABNORMAL HIGH (ref 4.0–10.5)
nRBC: 0 % (ref 0.0–0.2)

## 2021-10-26 LAB — I-STAT CHEM 8, ED
BUN: 15 mg/dL (ref 6–20)
Calcium, Ion: 1.09 mmol/L — ABNORMAL LOW (ref 1.15–1.40)
Chloride: 107 mmol/L (ref 98–111)
Creatinine, Ser: 0.6 mg/dL (ref 0.44–1.00)
Glucose, Bld: 116 mg/dL — ABNORMAL HIGH (ref 70–99)
HCT: 24 % — ABNORMAL LOW (ref 36.0–46.0)
Hemoglobin: 8.2 g/dL — ABNORMAL LOW (ref 12.0–15.0)
Potassium: 3.9 mmol/L (ref 3.5–5.1)
Sodium: 138 mmol/L (ref 135–145)
TCO2: 23 mmol/L (ref 22–32)

## 2021-10-26 LAB — I-STAT BETA HCG BLOOD, ED (MC, WL, AP ONLY): I-stat hCG, quantitative: <5 m[IU]/mL (ref <5)

## 2021-10-26 LAB — PROTIME-INR
INR: 0.9 (ref 0.8–1.2)
Prothrombin Time: 12.4 seconds (ref 11.4–15.2)

## 2021-10-26 LAB — IRON AND TIBC
Iron: 11 ug/dL — ABNORMAL LOW (ref 28–170)
Saturation Ratios: 3 % — ABNORMAL LOW (ref 10.4–31.8)
TIBC: 430 ug/dL (ref 250–450)
UIBC: 419 ug/dL

## 2021-10-26 LAB — HEMOGLOBIN A1C
Hgb A1c MFr Bld: 4.3 % — ABNORMAL LOW (ref 4.8–5.6)
Mean Plasma Glucose: 76.71 mg/dL

## 2021-10-26 LAB — RAPID URINE DRUG SCREEN, HOSP PERFORMED
Amphetamines: NOT DETECTED
Barbiturates: NOT DETECTED
Benzodiazepines: NOT DETECTED
Cocaine: NOT DETECTED
Opiates: NOT DETECTED
Tetrahydrocannabinol: POSITIVE — AB

## 2021-10-26 LAB — TSH: TSH: 4.901 u[IU]/mL — ABNORMAL HIGH (ref 0.350–4.500)

## 2021-10-26 LAB — HIV ANTIBODY (ROUTINE TESTING W REFLEX): HIV Screen 4th Generation wRfx: NONREACTIVE

## 2021-10-26 LAB — LDL CHOLESTEROL, DIRECT: Direct LDL: 86.3 mg/dL (ref 0–99)

## 2021-10-26 LAB — PREPARE RBC (CROSSMATCH)

## 2021-10-26 LAB — APTT: aPTT: 28 seconds (ref 24–36)

## 2021-10-26 LAB — CBG MONITORING, ED: Glucose-Capillary: 103 mg/dL — ABNORMAL HIGH (ref 70–99)

## 2021-10-26 LAB — ABO/RH: ABO/RH(D): O POS

## 2021-10-26 MED ORDER — ATORVASTATIN CALCIUM 40 MG PO TABS
40.0000 mg | ORAL_TABLET | Freq: Every day | ORAL | Status: DC
  Administered 2021-10-27 – 2021-11-02 (×7): 40 mg via ORAL
  Filled 2021-10-26 (×9): qty 1

## 2021-10-26 MED ORDER — CLOPIDOGREL BISULFATE 75 MG PO TABS
75.0000 mg | ORAL_TABLET | Freq: Every day | ORAL | Status: DC
  Administered 2021-10-26 – 2021-10-31 (×6): 75 mg via ORAL
  Filled 2021-10-26 (×7): qty 1

## 2021-10-26 MED ORDER — SODIUM CHLORIDE 0.9% FLUSH
3.0000 mL | Freq: Once | INTRAVENOUS | Status: AC
  Administered 2021-10-26: 3 mL via INTRAVENOUS

## 2021-10-26 MED ORDER — LORAZEPAM 2 MG/ML IJ SOLN
1.0000 mg | Freq: Once | INTRAMUSCULAR | Status: AC
  Administered 2021-10-26: 1 mg via INTRAVENOUS
  Filled 2021-10-26: qty 1

## 2021-10-26 MED ORDER — OYSTER SHELL CALCIUM/D3 500-5 MG-MCG PO TABS
1.0000 | ORAL_TABLET | Freq: Two times a day (BID) | ORAL | Status: DC
  Administered 2021-10-26 – 2021-11-02 (×14): 1 via ORAL
  Filled 2021-10-26 (×16): qty 1

## 2021-10-26 MED ORDER — IOHEXOL 350 MG/ML SOLN
100.0000 mL | Freq: Once | INTRAVENOUS | Status: AC | PRN
  Administered 2021-10-26: 100 mL via INTRAVENOUS

## 2021-10-26 MED ORDER — ACETAMINOPHEN 325 MG PO TABS
650.0000 mg | ORAL_TABLET | ORAL | Status: DC | PRN
  Administered 2021-10-29 – 2021-10-31 (×2): 650 mg via ORAL
  Filled 2021-10-26 (×2): qty 2

## 2021-10-26 MED ORDER — ENOXAPARIN SODIUM 40 MG/0.4ML IJ SOSY
40.0000 mg | PREFILLED_SYRINGE | INTRAMUSCULAR | Status: DC
  Administered 2021-10-27 – 2021-10-31 (×4): 40 mg via SUBCUTANEOUS
  Filled 2021-10-26 (×6): qty 0.4

## 2021-10-26 MED ORDER — NICOTINE 21 MG/24HR TD PT24
21.0000 mg | MEDICATED_PATCH | Freq: Every day | TRANSDERMAL | Status: DC
  Filled 2021-10-26 (×6): qty 1

## 2021-10-26 MED ORDER — ACETAMINOPHEN 650 MG RE SUPP: 650.0000 mg | RECTAL | Status: DC | PRN

## 2021-10-26 MED ORDER — SODIUM CHLORIDE 0.9 % IV BOLUS
1000.0000 mL | Freq: Once | INTRAVENOUS | Status: AC
  Administered 2021-10-26: 1000 mL via INTRAVENOUS

## 2021-10-26 MED ORDER — MEGESTROL ACETATE 40 MG PO TABS
40.0000 mg | ORAL_TABLET | Freq: Two times a day (BID) | ORAL | Status: DC
  Administered 2021-10-26 – 2021-11-02 (×14): 40 mg via ORAL
  Filled 2021-10-26 (×16): qty 1

## 2021-10-26 MED ORDER — SODIUM CHLORIDE 0.9 % IV SOLN: INTRAVENOUS | Status: AC

## 2021-10-26 MED ORDER — SODIUM CHLORIDE 0.9% IV SOLUTION: Freq: Once | INTRAVENOUS | Status: AC

## 2021-10-26 MED ORDER — FERROUS SULFATE 325 (65 FE) MG PO TABS
325.0000 mg | ORAL_TABLET | Freq: Three times a day (TID) | ORAL | Status: DC
  Administered 2021-10-26 – 2021-11-03 (×20): 325 mg via ORAL
  Filled 2021-10-26 (×20): qty 1

## 2021-10-26 MED ORDER — ACETAMINOPHEN 160 MG/5ML PO SOLN: 650.0000 mg | ORAL | Status: DC | PRN

## 2021-10-26 MED ORDER — VITAMIN D (ERGOCALCIFEROL) 1.25 MG (50000 UNIT) PO CAPS: 50000.0000 [IU] | ORAL_CAPSULE | ORAL | Status: DC

## 2021-10-26 MED ORDER — STROKE: EARLY STAGES OF RECOVERY BOOK: Freq: Once | Status: AC

## 2021-10-26 MED ORDER — LORAZEPAM 2 MG/ML IJ SOLN
0.5000 mg | Freq: Once | INTRAMUSCULAR | Status: AC
Start: 2021-10-26 — End: 2021-10-26
  Administered 2021-10-26: 0.5 mg via INTRAVENOUS
  Filled 2021-10-26: qty 1

## 2021-10-26 MED ORDER — ASPIRIN 300 MG RE SUPP
300.0000 mg | Freq: Once | RECTAL | Status: AC
  Administered 2021-10-26: 300 mg via RECTAL
  Filled 2021-10-26: qty 1

## 2021-10-26 MED ORDER — ASPIRIN EC 81 MG PO TBEC
81.0000 mg | DELAYED_RELEASE_TABLET | Freq: Every day | ORAL | Status: DC
  Filled 2021-10-26: qty 1

## 2021-10-26 MED ORDER — ASPIRIN 325 MG PO TABS
325.0000 mg | ORAL_TABLET | Freq: Once | ORAL | Status: DC
  Filled 2021-10-26: qty 1

## 2021-10-26 MED ORDER — SODIUM CHLORIDE 0.9 % IV SOLN
500.0000 mg | Freq: Once | INTRAVENOUS | Status: AC
  Administered 2021-10-27: 500 mg via INTRAVENOUS
  Filled 2021-10-26: qty 25

## 2021-10-26 NOTE — ED Notes (Signed)
MRI called to state pt was claustrophobic. Ativan ordered by MD and pt given meds in MRI.

## 2021-10-26 NOTE — ED Triage Notes (Signed)
Pt arrives via EMS from work after pt was found on the floor around 9 am. According the manager, pt drove to work and told him she felt off. EMS found pt not moving right side and aphasic.

## 2021-10-26 NOTE — ED Notes (Signed)
Pt sitting up in the bed trying to get out, pulled ccollar off.

## 2021-10-26 NOTE — ED Provider Notes (Addendum)
[facility] EMERGENCY DEPARTMENT Provider Note   CSN: [id number] Arrival date & time: 10/26/21  0935     History  Chief Complaint  Patient presents with   Code Stroke    Megan Chaney is a 51 y.o. female.  HPI  51 year old female presents emergency department as a code stroke.  Unknown past medical history.  Patient evaluated at EMS.  With neuro team at bedside.  Level 5 caveat for aphasia and acuity.  Report from EMS is that she showed up at work and was reported to be baseline however at 9 AM was found on the floor altered.  Unclear last known normal.  Presenting with right-sided weakness and aphasia.  Home Medications Prior to Admission medications   Medication Sig Start Date End Date Taking? Authorizing Provider  calcium-vitamin D (OSCAL 500/200 D-3) 500-200 MG-UNIT tablet Take 1 tablet by mouth 2 (two) times daily. 10/05/19   Donnamae Jude, MD  cefdinir (OMNICEF) 300 MG capsule Take 1 capsule (300 mg total) by mouth 2 (two) times daily. 02/18/21   Raspet, Derry Skill, PA-C  ferrous sulfate 325 (65 FE) MG tablet TAKE 1 TABLET (325 MG TOTAL) BY MOUTH 3 (THREE) TIMES DAILY WITH MEALS. 03/30/20   Donnamae Jude, MD  megestrol (MEGACE) 40 MG tablet TAKE 1 TABLET BY MOUTH TWICE A DAY 09/06/21   Donnamae Jude, MD  naproxen (NAPROSYN) 500 MG tablet TAKE 1 TABLET (500 MG TOTAL) BY MOUTH 2 (TWO) TIMES DAILY WITH A MEAL. 01/30/20   Donnamae Jude, MD  promethazine-dextromethorphan (PROMETHAZINE-DM) 6.25-15 MG/5ML syrup Take 5 mLs by mouth at bedtime as needed for cough. 02/18/21   Raspet, Derry Skill, PA-C  Vitamin D, Ergocalciferol, (DRISDOL) 1.25 MG (50000 UT) CAPS capsule Take 1 capsule (50,000 Units total) by mouth every 7 (seven) days. 10/05/19   Donnamae Jude, MD      Allergies    Other    Review of Systems   Review of Systems  Unable to perform ROS: Acuity of condition   Physical Exam Updated Vital Signs BP 127/81    Pulse (!) 107    Temp 98.2 F (36.8 C) (Oral)     Resp (!) 22    Wt 73.4 kg    SpO2 100%    BMI 28.66 kg/m  Physical Exam Vitals and nursing note reviewed.  Constitutional:      Appearance: Normal appearance. She is ill-appearing. She is not diaphoretic.  HENT:     Head: Normocephalic.     Mouth/Throat:     Mouth: Mucous membranes are moist.  Eyes:     Pupils: Pupils are equal, round, and reactive to light.  Neck:     Comments: C-collar placed by EMS prior to arrival Cardiovascular:     Rate and Rhythm: Tachycardia present.  Pulmonary:     Effort: Pulmonary effort is normal. No respiratory distress.  Skin:    General: Skin is warm.  Neurological:     Mental Status: She is alert.     Comments: Aphasic, weak on the right side  Psychiatric:        Mood and Affect: Mood normal.    ED Results / Procedures / Treatments   Labs (all labs ordered are listed, but only abnormal results are displayed) Labs Reviewed  CBC - Abnormal; Notable for the following components:      Result Value   WBC 14.0 (*)    RBC 2.83 (*)    Hemoglobin 7.2 (*)  HCT 24.2 (*)    MCH 25.4 (*)    MCHC 29.8 (*)    RDW 16.5 (*)    Platelets 457 (*)    All other components within normal limits  DIFFERENTIAL - Abnormal; Notable for the following components:   Neutro Abs 11.2 (*)    All other components within normal limits  COMPREHENSIVE METABOLIC PANEL - Abnormal; Notable for the following components:   CO2 21 (*)    Glucose, Bld 118 (*)    Calcium 8.8 (*)    All other components within normal limits  I-STAT CHEM 8, ED - Abnormal; Notable for the following components:   Glucose, Bld 116 (*)    Calcium, Ion 1.09 (*)    Hemoglobin 8.2 (*)    HCT 24.0 (*)    All other components within normal limits  CBG MONITORING, ED - Abnormal; Notable for the following components:   Glucose-Capillary 103 (*)    All other components within normal limits  PROTIME-INR  APTT  I-STAT BETA HCG BLOOD, ED ([facility], [facility], [facility] ONLY)    EKG EKG  Interpretation  Date/Time:  Monday October 26 2021 10:10:07 EST Ventricular Rate:  119 PR Interval:  122 QRS Duration: 85 QT Interval:  360 QTC Calculation: 507 R Axis:   73 Text Interpretation: Sinus tachycardia Ventricular premature complex Borderline T abnormalities, diffuse leads Borderline prolonged QT interval Confirmed by Lavenia Atlas [id number]) on 10/26/2021 10:21:52 AM  Radiology CT HEAD CODE STROKE WO CONTRAST  Result Date: 10/26/2021 CLINICAL DATA:  Code stroke.  Right-sided weakness EXAM: CT HEAD WITHOUT CONTRAST TECHNIQUE: Contiguous axial images were obtained from the base of the skull through the vertex without intravenous contrast. COMPARISON:  None. FINDINGS: Brain: No evidence of acute infarction, hemorrhage, hydrocephalus, extra-axial collection or mass lesion/mass effect. Vascular: No hyperdense vessel or unexpected calcification. Skull: Normal. Negative for fracture or focal lesion. Sinuses/Orbits: No acute finding. Other: These results were communicated to [facility] at 10:00 am on 10/26/2021 by text page via the [facility] messaging system. ASPECTS [facility] Stroke Program Early CT Score) - Ganglionic level infarction (caudate, lentiform nuclei, internal capsule, insula, M1-M3 cortex): 7 - Supraganglionic infarction (M4-M6 cortex): 3 Total score (0-10 with 10 being normal): 10 IMPRESSION: No acute finding.  ASPECTS is 10. Electronically Signed   By: Jorje Guild M.D.   On: 10/26/2021 10:01   CT ANGIO HEAD NECK W WO CM W PERF (CODE STROKE)  Result Date: 10/26/2021 CLINICAL DATA:  Right-sided weakness EXAM: CT ANGIOGRAPHY HEAD AND NECK CT PERFUSION BRAIN TECHNIQUE: Multidetector CT imaging of the head and neck was performed using the standard protocol during bolus administration of intravenous contrast. Multiplanar CT image reconstructions and MIPs were obtained to evaluate the vascular anatomy. Carotid stenosis measurements (when applicable) are obtained utilizing NASCET criteria, using the  distal internal carotid diameter as the denominator. Multiphase CT imaging of the brain was performed following IV bolus contrast injection. Subsequent parametric perfusion maps were calculated using RAPID software. CONTRAST:  Dose is not known on this in progress study COMPARISON:  None. FINDINGS: CTA NECK FINDINGS Aortic arch: Normal Right carotid system: Vessels are smooth and widely patent Left carotid system: Vessels are smooth and widely patent Vertebral arteries: No proximal subclavian stenosis. Skeleton: Negative Other neck: No acute finding Upper chest: No acute finding Review of the MIP images confirms the above findings CTA HEAD FINDINGS Anterior circulation: No emergent large vessel occlusion. No proximal flow limiting stenosis, beading, or aneurysm Posterior circulation:  No emergent finding  Venous sinuses: Unremarkable Review of the MIP images confirms the above findings CT Brain Perfusion Findings: ASPECTS: 10 CBF (<30%) Volume: 35mL Perfusion (Tmax>6.0s) volume: 53mL. With a 4 second threshold there is and even greater area of diminished perfusion in the left cerebral hemisphere, question shower of emboli or autolysis. These results were called by telephone at the time of interpretation on 10/26/2021 at 10:15 am to provider [facility] , who verbally acknowledged these results. IMPRESSION: 1. No emergent large vessel occlusion. 2. CT perfusion shows ischemic range blood flow in the left frontal lobe, suspect peripheral emboli. 3. No embolic source or significant atheromatous disease seen in the neck. Electronically Signed   By: Jorje Guild M.D.   On: 10/26/2021 10:18    Procedures .Critical Care Performed by: Lorelle Gibbs, DO Authorized by: Lorelle Gibbs, DO   Critical care provider statement:    Critical care time (minutes):  45   Critical care was necessary to treat or prevent imminent or life-threatening deterioration of the following conditions:  CNS failure or compromise    Critical care was time spent personally by me on the following activities:  Development of treatment plan with patient or surrogate, discussions with consultants, evaluation of patient's response to treatment, examination of patient, ordering and review of laboratory studies, ordering and review of radiographic studies, ordering and performing treatments and interventions, pulse oximetry, re-evaluation of patient's condition and review of old charts   I assumed direction of critical care for this patient from another provider in my specialty: no     Care discussed with: admitting provider    Patient tachycardic, sinus tachycardia, remained in sinus rhythm upon telemetry review, blood pressure stable.  Medications Ordered in ED Medications  sodium chloride flush (NS) 0.9 % injection 3 mL (has no administration in time range)  iohexol (OMNIPAQUE) 350 MG/ML injection 100 mL (100 mLs Intravenous Contrast Given 10/26/21 0957)    ED Course/ Medical Decision Making/ A&P                           Medical Decision Making  This patient presents to the ED for concern of altered mental status, right-sided weakness, aphasia, this involves an extensive number of treatment options, and is a complaint that carries with it a high risk of complications and morbidity.  The differential diagnosis includes TIA, CVA, altered mental status, acute neurologic condition, metabolic abnormality/encephalopathy   Additional history obtained:  Additional history obtained from sister Megan Chaney over the phone, no further medical history/known daily medications per the sister External records from outside source obtained and reviewed including chart review, previous notes/labs/imaging   Lab Tests:  I Ordered, reviewed, and interpreted labs.  The pertinent results include: Anemia of 7.2, CBG on arrival was 103   Imaging Studies ordered:  I ordered imaging studies including CT head, CT perfusion, CT cervical spine I  independently visualized and interpreted imaging which showed left peripheral MCA stroke with no LVO I agree with the radiologist interpretation   Cardiac Monitoring:  The patient was maintained on a cardiac monitor.  I personally viewed and interpreted the cardiac monitored which showed an underlying rhythm of: Sinus tachycardia   Medicines ordered and prescription drug management:  I ordered medication per neurology recommendations but no tPA indicated per the stroke team.     Critical Interventions:  Admit to stroke neurology team.  Currently vitals are stable, neurology exam is unchanged.   Consultations Obtained:  Patient seen in conjunction with the stroke neurology team    Problem List / ED Course:  Patient presented as a code stroke for being found on the ground, altered mental status, right-sided weakness and aphasia.  Exam concerning for CVA.  No last known normal.  CT of the head shows no hemorrhage.  CT stroke study shows left MCA stroke.  Per neurology team no indicated emergent treatment.  Plan for admission.   Reevaluation:  After the interventions noted above, I reevaluated the patient and found that they have :stayed the same   Dispostion:  Admit to the hospitalist with support of stroke neurology team.  Sister Megan Chaney has been notified by myself via phone of her admission status.  Final Clinical Impression(s) / ED Diagnoses Final diagnoses:  Acute ischemic stroke [facility])    Rx / DC Orders ED Discharge Orders     None         Lorelle Gibbs, DO 10/26/21 1047    Sharla Tankard, Alvin Critchley, DO 10/26/21 1233

## 2021-10-26 NOTE — Progress Notes (Signed)
EEG done at bedside. No skin breakdown noted. Results pending. 

## 2021-10-26 NOTE — Progress Notes (Signed)
Iron level depleted despite on as needed, 1 dose Venofer IV ordered for tomorrow.

## 2021-10-26 NOTE — ED Notes (Signed)
Pts BF at bedside

## 2021-10-26 NOTE — Consult Note (Addendum)
Addendum 1230: Boyfriend Richard Miu [phone] arrived to hospital. Confirmed LKW last night. He woke up to the sounds of breaking glass in the kitchen and found her staggering and holding herself up against the wall. She was able to tell him his name and her employer. She then left and drove to work.  NEUROLOGY CONSULTATION NOTE   Date of service: October 26, 2021 Patient Name: Megan Chaney MRN:  [id number] DOB:  [birth year] Reason for consult: stroke code Requesting physician: Dr. Lavenia Atlas _ _ _   _ __   _ __ _ _  __ __   _ __   __ _  History of Present Illness   This is a 51 year old woman with a past medical his history significant for fibroids who presented as a pretty activated stroke code with EMS for aphasia and right-sided weakness.  Patient is unable to provide history due to global aphasia.  Apparently she told coworkers that she "was not feeling right" when she arrived to work [employer] this morning and then she was found on the floor in the break room.  We are unable to get in touch with anybody from her work.  She did report that she was not feeling well when she arrived and we are unable to clarify what she was referring to her when this started.  EMS also stated that her boyfriend may have been with her this morning before she went to work but we are unable to get in touch with him.  Sister was contacted she did not have any past medical history, could not clarify if patient was on anticoagulation, did not know her last known well and had not seen her in days, and did not have her boyfriend's contact information.  On examination she had global aphasia and right-sided weakness.  NIH stroke scale was 19.  CT head showed no acute intracranial process.  CTA did not show LVO.  CT perfusion showed an 8 mL perfusion deficit and a left MCA branch territory.  TNK was not administered 2/2 unknown LKW. CTA showed no intervenable lesion therefore intervention was not performed.   CNS  imaging personally reviewed and discussed with radiologist   ROS   UTA 2/2 aphasia  Past History   I have reviewed the following:  Past Medical History:  Diagnosis Date   Anemia    Fibroid    GERD (gastroesophageal reflux disease)    Past Surgical History:  Procedure Laterality Date   MULTIPLE TOOTH EXTRACTIONS     NO PAST SURGERIES     Family History  Problem Relation Age of Onset   Chronic Renal Failure Father    Miscarriages / Stillbirths Father    Heart attack Mother    Stroke Mother    Cancer Mother    Diabetes Mother    Social History   Socioeconomic History   Marital status: Single    Spouse name: Not on file   Number of children: Not on file   Years of education: Not on file   Highest education level: Not on file  Occupational History   Not on file  Tobacco Use   Smoking status: Former    Types: Cigarettes    Quit date: 07/29/1999    Years since quitting: 22.2   Smokeless tobacco: Never  Vaping Use   Vaping Use: Never used  Substance and Sexual Activity   Alcohol use: Yes   Drug use: Not Currently    Types: Marijuana  Comment: Quit in July 2019   Sexual activity: Yes    Birth control/protection: None  Other Topics Concern   Not on file  Social History Narrative   Not on file   Social Determinants of Health   Financial Resource Strain: Not on file  Food Insecurity: Not on file  Transportation Needs: Not on file  Physical Activity: Not on file  Stress: Not on file  Social Connections: Not on file   Allergies  Allergen Reactions   Other Rash    Powder in latex gloves    Medications   (Not in a hospital admission)     Current Facility-Administered Medications:    sodium chloride flush (NS) 0.9 % injection 3 mL, 3 mL, Intravenous, Once, Horton, Kristie M, DO  Current Outpatient Medications:    calcium-vitamin D (OSCAL 500/200 D-3) 500-200 MG-UNIT tablet, Take 1 tablet by mouth 2 (two) times daily., Disp: 180 tablet, Rfl: 3    cefdinir (OMNICEF) 300 MG capsule, Take 1 capsule (300 mg total) by mouth 2 (two) times daily., Disp: 14 capsule, Rfl: 0   ferrous sulfate 325 (65 FE) MG tablet, TAKE 1 TABLET (325 MG TOTAL) BY MOUTH 3 (THREE) TIMES DAILY WITH MEALS., Disp: 270 tablet, Rfl: 1   megestrol (MEGACE) 40 MG tablet, TAKE 1 TABLET BY MOUTH TWICE A DAY, Disp: 180 tablet, Rfl: 1   naproxen (NAPROSYN) 500 MG tablet, TAKE 1 TABLET (500 MG TOTAL) BY MOUTH 2 (TWO) TIMES DAILY WITH A MEAL., Disp: 60 tablet, Rfl: 1   promethazine-dextromethorphan (PROMETHAZINE-DM) 6.25-15 MG/5ML syrup, Take 5 mLs by mouth at bedtime as needed for cough., Disp: 118 mL, Rfl: 0   Vitamin D, Ergocalciferol, (DRISDOL) 1.25 MG (50000 UT) CAPS capsule, Take 1 capsule (50,000 Units total) by mouth every 7 (seven) days., Disp: 8 capsule, Rfl: 0  Vitals   Vitals:   10/26/21 0900 10/26/21 0952 10/26/21 1015 10/26/21 1030  BP:  (!) 152/79 127/78 127/81  Pulse:  (!) 116 (!) 109 (!) 107  Resp:   19 (!) 22  Temp:   98.2 F (36.8 C)   TempSrc:   Oral   SpO2:  98% 100% 100%  Weight: 73.4 kg        Body mass index is 28.66 kg/m.  Physical Exam   Physical Exam Gen: alert, unable to answer orientation questions 2/2 aphasia, unable to follow commands Resp: CTAB, no w/r/r CV: RRR, no m/g/r; nml S1 and S2. 2+ symmetric peripheral pulses. Abd: soft/NT/ND; nabs x 4 quad Extrem: Nml bulk; no cyanosis, clubbing, or edema.  Neuro: *MS: alert, unable to answer orientation questions 2/2 aphasia, unable to follow commands *Speech: moderate dysarthria, severe aphasia, unable to name or repeat *CN: PERRLA, blinks to threat bilat, EOMI, R UMN facial droop, hearing intact to voice *Motor: LUE anti-gravity with drift but not to bed, LLE some movement against gravity, RUE/RLE no movement  *Sensory: SILT *Coordination:  UTA 2/2 aphasia *Reflexes:  2+ and symmetric throughout without clonus; toes down-going bilat *Gait: UTA  NIHSS  1a Level of Conscious.:  0 1b LOC Questions: 2 1c LOC Commands: 2 2 Best Gaze: 0 3 Visual: 0 4 Facial Palsy: 1 5a Motor Arm - left: 1 5b Motor Arm - Right: 4 6a Motor Leg - Left: 2 6b Motor Leg - Right: 4 7 Limb Ataxia: 0 8 Sensory: 0 9 Best Language: 2 10 Dysarthria: 1 11 Extinct. and Inatten.: 0  TOTAL: 19   Premorbid mRS = 0   Labs  CBC:  Recent Labs  Lab 10/26/21 0940 10/26/21 0948  WBC 14.0*  --   NEUTROABS 11.2*  --   HGB 7.2* 8.2*  HCT 24.2* 24.0*  MCV 85.5  --   PLT 457*  --     Basic Metabolic Panel:  Lab Results  Component Value Date   NA 138 10/26/2021   K 3.9 10/26/2021   CO2 21 (L) 10/26/2021   GLUCOSE 116 (H) 10/26/2021   BUN 15 10/26/2021   CREATININE 0.60 10/26/2021   CALCIUM 8.8 (L) 10/26/2021   GFRNONAA >60 10/26/2021   GFRAA 121 10/04/2019   Lipid Panel:  Lab Results  Component Value Date   LDLCALC 90 10/04/2019   HgbA1c:  Lab Results  Component Value Date   HGBA1C 5.0 10/04/2019   Urine Drug Screen: No results found for: LABOPIA, COCAINSCRNUR, LABBENZ, AMPHETMU, THCU, LABBARB  Alcohol Level No results found for: ETH   Impression   This is a 51 year old woman with a past medical his history significant for fibroids who presented as a pretty activated stroke code with EMS for aphasia and right-sided weakness c/f acute ischemic stroke. Unfortunately she was not a TNK candidate 2/2 unknown LKW. CTA showed no LVO.   Recommendations   - Admit to hospitalist service for stroke w/u; stroke team will consult - Permissive HTN x48 hrs from sx onset or until stroke ruled out by MRI goal BP <220/110. PRN labetalol or hydralazine if BP above these parameters. Avoid oral antihypertensives. - MRI brain wo contrast - TTE w/ bubble - Check A1c and LDL + add statin per guidelines - ASA 325mg  now f/b 81mg  daily - plavix 75mg  daily - q4 hr neuro checks - STAT head CT for any change in neuro exam - Tele - PT/OT/SLP - Stroke education - Amb referral to neurology  upon discharge   Stroke team will continue to follow.  This patient is critically ill and at significant risk of neurological worsening, death and care requires constant monitoring of vital signs, hemodynamics,respiratory and cardiac monitoring, neurological assessment, discussion with family, other specialists and medical decision making of high complexity. I spent 75 minutes of neurocritical care time  in the care of  this patient. This was time spent independent of any time provided by nurse practitioner or PA.  Su Monks, MD [facility] [phone]  If 7pm- 7am, please page neurology on call as listed in [place].

## 2021-10-26 NOTE — Evaluation (Signed)
Clinical/Bedside Swallow Evaluation Patient Details  Name: Megan Chaney MRN: [id number] Date of Birth: Jan 21, 1971  Today's Date: 10/26/2021 Time: SLP Start Time (ACUTE ONLY): 0347 SLP Stop Time (ACUTE ONLY): 1557 SLP Time Calculation (min) (ACUTE ONLY): 10 min  Past Medical History:  Past Medical History:  Diagnosis Date   Anemia    Fibroid    GERD (gastroesophageal reflux disease)    Past Surgical History:  Past Surgical History:  Procedure Laterality Date   MULTIPLE TOOTH EXTRACTIONS     NO PAST SURGERIES     HPI:  51 y.o. female presented to ED with aphasia and right sided weakness. MRI: Multifocal acute/subacute nonhemorrhagic infarcts involving the left MCA territory. Findings are predominantly inferior and posteriorly. There is cortical infarction also involves the more anterior middle and inferior frontal lobes. Prior medical history significant for chronic iron deficiency anemia secondary to fibroids and menorrhagia on hormone therapy, cigar smoker, GERD.    Assessment / Plan / Recommendation  Clinical Impression  Pt was able to participate in clinical swallow assessment in ED.  There was a mild CN VII deficit on right noted at rest and during activity.  Pt could not follow oral motor commands, commands to swallow or cough.  She did accept ice chips, water from a straw, and spoons of applesauce with mild spillage right side of mouth but no s/s of aspiration. Voice remained clear. No coughing despite multiple trials. Recommend starting a dysphagia 1 diet, thin liquids.  SLP will follow for safety/diet advancement. D/W RN. SLP Visit Diagnosis: Dysphagia, oral phase (R13.11)    Aspiration Risk    tba   Diet Recommendation   Pureed/dysphagia 1, thin liquids  Medication Administration: Whole meds with puree    Other  Recommendations Oral Care Recommendations: Oral care BID    Recommendations for follow up therapy are one component of a multi-disciplinary discharge planning  process, led by the attending physician.  Recommendations may be updated based on patient status, additional functional criteria and insurance authorization.  Follow up Recommendations Acute inpatient rehab (3hours/day)      Assistance Recommended at Discharge Frequent or constant Supervision/Assistance  Functional Status Assessment Patient has had a recent decline in their functional status and demonstrates the ability to make significant improvements in function in a reasonable and predictable amount of time.  Frequency and Duration min 3x week  2 weeks       Prognosis Prognosis for Safe Diet Advancement: Good      Swallow Study   General Date of Onset: 10/26/21 HPI: 51 y.o. female presented to ED with aphasia and right sided weakness. MRI: Multifocal acute/subacute nonhemorrhagic infarcts involving the left MCA territory. Findings are predominantly inferior and posteriorly. There is cortical infarction also involves the more anterior middle and inferior frontal lobes. Prior medical history significant for chronic iron deficiency anemia secondary to fibroids and menorrhagia on hormone therapy, cigar smoker, GERD. Type of Study: Bedside Swallow Evaluation Previous Swallow Assessment: no Diet Prior to this Study: NPO Temperature Spikes Noted: No Respiratory Status: Room air History of Recent Intubation: No Behavior/Cognition: Alert;Cooperative Oral Cavity Assessment: Within Functional Limits Oral Care Completed by SLP: Recent completion by staff Oral Cavity - Dentition: Adequate natural dentition Self-Feeding Abilities: Needs assist Patient Positioning: Upright in bed Baseline Vocal Quality: Normal Volitional Cough: Cognitively unable to elicit Volitional Swallow: Unable to elicit    Oral/Motor/Sensory Function Overall Oral Motor/Sensory Function: Other (comment) (mild right facial weakness; unable to follow commands)   Ice Chips Ice chips:  Within functional limits Presentation:  Spoon   Thin Liquid Thin Liquid: Impaired Presentation: Spoon;Straw Oral Phase Impairments: Reduced labial seal Oral Phase Functional Implications: Right anterior spillage    Nectar Thick Nectar Thick Liquid: Not tested   Honey Thick Honey Thick Liquid: Not tested   Puree Puree: Impaired Presentation: Spoon Oral Phase Impairments: Reduced labial seal Oral Phase Functional Implications: Right anterior spillage   Solid     Solid: Not tested      Juan Quam Laurice 10/26/2021,4:24 PM  Estill Bamberg L. Tivis Ringer, [place] Office number [phone] Pager [phone]

## 2021-10-26 NOTE — ED Notes (Signed)
Pt pulling at ccollar, sitting up in bed. Purewick placed and pt explained that she needs to leave ccollar on until CT results

## 2021-10-26 NOTE — H&P (Signed)
History and Physical    Megan Chaney DGU:[id number] DOB: [birth year] DOA: 10/26/2021  PCP: Michael Boston, MD (Confirm with patient/family/NH records and if not entered, this has to be entered at [facility] point of entry) Patient coming from: Home  I have personally briefly reviewed patient's old medical records in [place]  Chief Complaint: can not move right side  HPI: Megan Chaney is a 51 y.o. female with medical history significant of chronic iron deficiency anemia secondary to fibroids and menorrhagia on hormone therapy, cigar smoker, GERD, presented with sudden onset of right-sided weakness and aphagia.  Patient has global aphagia unable to provide any history.  Fianc at bedside reported that patient woke up this morning complaining about" did not feel right", despite, she went to work this morning.  At work, she was found to be on the floor by coworker called EMS.  Patient despite awake and alert unable to talk.  ED, when asking about weakness patient pointed to her right side.  And unable to move her right-sided low.  No history of stroke as per fianc, patient does have long history of fibroids and menorrhagia and anemia, was on Megace for 7 to 36-month earlier this year stopped for several months and recently just started Megace about 1 and half month ago.  Patient smokes occasionally.  ED Course: Blood pressure on the lower side, TIA and head CT had perfusion showed ischemic range blood flow in the left frontal lobe suspect peripheral emboli.  Hb=7.2, patient is tachycardia.  Neurology ordered 1 L wide to bolus.  Review of Systems: As per HPI otherwise 14 point review of systems negative.    Past Medical History:  Diagnosis Date   Anemia    Fibroid    GERD (gastroesophageal reflux disease)     Past Surgical History:  Procedure Laterality Date   MULTIPLE TOOTH EXTRACTIONS     NO PAST SURGERIES       reports that she quit smoking about 22 years ago. She has never  used smokeless tobacco. She reports current alcohol use. She reports that she does not currently use drugs after having used the following drugs: Marijuana.  Allergies  Allergen Reactions   Other Rash    Powder in latex gloves    Family History  Problem Relation Age of Onset   Chronic Renal Failure Father    Miscarriages / Stillbirths Father    Heart attack Mother    Stroke Mother    Cancer Mother    Diabetes Mother      Prior to Admission medications   Medication Sig Start Date End Date Taking? Authorizing Provider  calcium-vitamin D (OSCAL 500/200 D-3) 500-200 MG-UNIT tablet Take 1 tablet by mouth 2 (two) times daily. 10/05/19   Donnamae Jude, MD  cefdinir (OMNICEF) 300 MG capsule Take 1 capsule (300 mg total) by mouth 2 (two) times daily. 02/18/21   Raspet, Derry Skill, PA-C  Cholecalciferol (VITAMIN D3) 1.25 MG (50000 UT) CAPS Take 1 capsule by mouth once a week. 07/20/21   [provider]  ferrous sulfate 325 (65 FE) MG tablet TAKE 1 TABLET (325 MG TOTAL) BY MOUTH 3 (THREE) TIMES DAILY WITH MEALS. 03/30/20   Donnamae Jude, MD  medroxyPROGESTERone (PROVERA) 5 MG tablet Take 5 mg by mouth daily. 08/04/21   [provider]  megestrol (MEGACE) 40 MG tablet TAKE 1 TABLET BY MOUTH TWICE A DAY Patient taking differently: Take 40 mg by mouth 2 (two) times daily. 09/06/21  Donnamae Jude, MD  naproxen (NAPROSYN) 500 MG tablet TAKE 1 TABLET (500 MG TOTAL) BY MOUTH 2 (TWO) TIMES DAILY WITH A MEAL. 01/30/20   Donnamae Jude, MD  promethazine-dextromethorphan (PROMETHAZINE-DM) 6.25-15 MG/5ML syrup Take 5 mLs by mouth at bedtime as needed for cough. 02/18/21   Raspet, Derry Skill, PA-C  Vitamin D, Ergocalciferol, (DRISDOL) 1.25 MG (50000 UT) CAPS capsule Take 1 capsule (50,000 Units total) by mouth every 7 (seven) days. 10/05/19   Donnamae Jude, MD    Physical Exam: Vitals:   10/26/21 1200 10/26/21 1215 10/26/21 1230 10/26/21 1300  BP: 97/79 107/68 115/76 122/81  Pulse: (!) 116 (!)  114 (!) 107 (!) 122  Resp: 19 (!) 27 (!) 21 (!) 22  Temp:      TempSrc:      SpO2: 100% 100% 100% 100%  Weight:        Constitutional: NAD, calm, comfortable Vitals:   10/26/21 1200 10/26/21 1215 10/26/21 1230 10/26/21 1300  BP: 97/79 107/68 115/76 122/81  Pulse: (!) 116 (!) 114 (!) 107 (!) 122  Resp: 19 (!) 27 (!) 21 (!) 22  Temp:      TempSrc:      SpO2: 100% 100% 100% 100%  Weight:       Eyes: PERRL, lids and conjunctivae normal ENMT: Mucous membranes are moist. Posterior pharynx clear of any exudate or lesions.Normal dentition.  Neck: normal, supple, no masses, no thyromegaly Respiratory: clear to auscultation bilaterally, no wheezing, no crackles. Normal respiratory effort. No accessory muscle use.  Cardiovascular: Regular rate and rhythm, no murmurs / rubs / gallops. No extremity edema. 2+ pedal pulses. No carotid bruits.  Abdomen: no tenderness, no masses palpated. No hepatosplenomegaly. Bowel sounds positive.  Musculoskeletal: no clubbing / cyanosis. No joint deformity upper and lower extremities. Good ROM, no contractures. Normal muscle tone.  Skin: no rashes, lesions, ulcers. No induration Neurologic: CN 2-12 grossly intact. Sensation intact, DTR normal. Strength 3/5 on right arm, hand and right leg as compared to 5/5 on the left side.  Psychiatric: Normal judgment and insight. Alert and oriented x 3. Normal mood.     Labs on Admission: I have personally reviewed following labs and imaging studies  CBC: Recent Labs  Lab 10/26/21 0940 10/26/21 0948  WBC 14.0*  --   NEUTROABS 11.2*  --   HGB 7.2* 8.2*  HCT 24.2* 24.0*  MCV 85.5  --   PLT 457*  --    Basic Metabolic Panel: Recent Labs  Lab 10/26/21 0940 10/26/21 0948  NA 138 138  K 4.0 3.9  CL 107 107  CO2 21*  --   GLUCOSE 118* 116*  BUN 15 15  CREATININE 0.64 0.60  CALCIUM 8.8*  --    GFR: CrCl cannot be calculated (Unknown ideal weight.). Liver Function Tests: Recent Labs  Lab 10/26/21 0940   AST 28  ALT 25  ALKPHOS 57  BILITOT 0.4  PROT 6.5  ALBUMIN 3.7   No results for input(s): LIPASE, AMYLASE in the last 168 hours. No results for input(s): AMMONIA in the last 168 hours. Coagulation Profile: Recent Labs  Lab 10/26/21 0940  INR 0.9   Cardiac Enzymes: No results for input(s): CKTOTAL, CKMB, CKMBINDEX, TROPONINI in the last 168 hours. BNP (last 3 results) No results for input(s): PROBNP in the last 8760 hours. HbA1C: Recent Labs    10/26/21 0940  HGBA1C 4.3*   CBG: Recent Labs  Lab 10/26/21 0938  GLUCAP 103*  Lipid Profile: No results for input(s): CHOL, HDL, LDLCALC, TRIG, CHOLHDL, LDLDIRECT in the last 72 hours. Thyroid Function Tests: No results for input(s): TSH, T4TOTAL, FREET4, T3FREE, THYROIDAB in the last 72 hours. Anemia Panel: No results for input(s): VITAMINB12, FOLATE, FERRITIN, TIBC, IRON, RETICCTPCT in the last 72 hours. Urine analysis:    Component Value Date/Time   COLORURINE YELLOW 07/28/2018 1328   APPEARANCEUR CLOUDY (A) 07/28/2018 1328   LABSPEC 1.024 07/28/2018 1328   PHURINE 7.0 07/28/2018 1328   GLUCOSEU NEGATIVE 07/28/2018 1328   HGBUR LARGE (A) 07/28/2018 1328   BILIRUBINUR NEGATIVE 07/28/2018 1328   KETONESUR NEGATIVE 07/28/2018 1328   PROTEINUR 100 (A) 07/28/2018 1328   UROBILINOGEN 0.2 01/27/2013 1542   NITRITE NEGATIVE 07/28/2018 1328   LEUKOCYTESUR TRACE (A) 07/28/2018 1328    Radiological Exams on Admission: CT C-SPINE NO CHARGE  Result Date: 10/26/2021 CLINICAL DATA:  Trauma, found down EXAM: CT CERVICAL SPINE WITHOUT CONTRAST TECHNIQUE: Multidetector CT imaging of the cervical spine was performed without intravenous contrast. Multiplanar CT image reconstructions were also generated. COMPARISON:  None. FINDINGS: Alignment: Alignment of posterior margins of vertebral bodies is unremarkable. Degenerative changes are noted with bony spurs at C4-C5 and C5-C6 levels. Skull base and vertebrae: No recent fracture is seen.  Soft tissues and spinal canal: There is no significant spinal stenosis. Prevertebral soft tissues are unremarkable. Disc levels: There is mild encroachment of neural foramina at C4-C5 and C5-C6 levels anterior bony spurs seen in the upper thoracic spine. Upper chest: Unremarkable. Other: Thyroid is enlarged in size. IMPRESSION: No recent fracture is seen in the cervical spine. Alignment of posterior margins of vertebral bodies is unremarkable. Cervical spondylosis with mild encroachment of neural foramina at C4-C5 and C5-C6 levels. Electronically Signed   By: Elmer Picker M.D.   On: 10/26/2021 11:19   CT HEAD CODE STROKE WO CONTRAST  Result Date: 10/26/2021 CLINICAL DATA:  Code stroke.  Right-sided weakness EXAM: CT HEAD WITHOUT CONTRAST TECHNIQUE: Contiguous axial images were obtained from the base of the skull through the vertex without intravenous contrast. COMPARISON:  None. FINDINGS: Brain: No evidence of acute infarction, hemorrhage, hydrocephalus, extra-axial collection or mass lesion/mass effect. Vascular: No hyperdense vessel or unexpected calcification. Skull: Normal. Negative for fracture or focal lesion. Sinuses/Orbits: No acute finding. Other: These results were communicated to [facility] at 10:00 am on 10/26/2021 by text page via the [facility] messaging system. ASPECTS [facility] Stroke Program Early CT Score) - Ganglionic level infarction (caudate, lentiform nuclei, internal capsule, insula, M1-M3 cortex): 7 - Supraganglionic infarction (M4-M6 cortex): 3 Total score (0-10 with 10 being normal): 10 IMPRESSION: No acute finding.  ASPECTS is 10. Electronically Signed   By: Jorje Guild M.D.   On: 10/26/2021 10:01   CT ANGIO HEAD NECK W WO CM W PERF (CODE STROKE)  Result Date: 10/26/2021 CLINICAL DATA:  Right-sided weakness EXAM: CT ANGIOGRAPHY HEAD AND NECK CT PERFUSION BRAIN TECHNIQUE: Multidetector CT imaging of the head and neck was performed using the standard protocol during bolus administration  of intravenous contrast. Multiplanar CT image reconstructions and MIPs were obtained to evaluate the vascular anatomy. Carotid stenosis measurements (when applicable) are obtained utilizing NASCET criteria, using the distal internal carotid diameter as the denominator. Multiphase CT imaging of the brain was performed following IV bolus contrast injection. Subsequent parametric perfusion maps were calculated using RAPID software. CONTRAST:  Dose is not known on this in progress study COMPARISON:  None. FINDINGS: CTA NECK FINDINGS Aortic arch: Normal Right carotid system: Vessels  are smooth and widely patent Left carotid system: Vessels are smooth and widely patent Vertebral arteries: No proximal subclavian stenosis. Skeleton: Negative Other neck: No acute finding Upper chest: No acute finding Review of the MIP images confirms the above findings CTA HEAD FINDINGS Anterior circulation: No emergent large vessel occlusion. No proximal flow limiting stenosis, beading, or aneurysm Posterior circulation:  No emergent finding Venous sinuses: Unremarkable Review of the MIP images confirms the above findings CT Brain Perfusion Findings: ASPECTS: 10 CBF (<30%) Volume: 66mL Perfusion (Tmax>6.0s) volume: 94mL. With a 4 second threshold there is and even greater area of diminished perfusion in the left cerebral hemisphere, question shower of emboli or autolysis. These results were called by telephone at the time of interpretation on 10/26/2021 at 10:15 am to provider [facility] , who verbally acknowledged these results. IMPRESSION: 1. No emergent large vessel occlusion. 2. CT perfusion shows ischemic range blood flow in the left frontal lobe, suspect peripheral emboli. 3. No embolic source or significant atheromatous disease seen in the neck. Electronically Signed   By: Jorje Guild M.D.   On: 10/26/2021 10:18    EKG: Independently reviewed.  Sinus tachycardia, borderline prolonged QTC  Assessment/Plan Principal Problem:    Stroke (cerebrum) ([place])  (please populate well all problems here in Problem List. (For example, if patient is on BP meds at home and you resume or decide to hold them, it is a problem that needs to be her. Same for CAD, COPD, HLD and so on)  Right-sided paresis and acute global aphasia -Secondary to left frontal lobe stroke -MRI to follow -Aspirin -Check A1c and lipid panel.  Risk factor modification, patient on hormone manipulation for fibroid and menorrhagia/iron deficiency anemia, but she still smokes.  Educated patient and the fianc at bedside regarding plan of quitting smoking if she will continue to be on Megace.  Both expressed understanding and agreed. -Echo -UDS  Symptomatic anemia -Her BP is borderline low with tachycardia -Hemoglobin 7.2 compared to baseline 9-11 2 years ago.  Blood pressure not improving after 1 L IV bolus, given the patient also has acute stroke, will order 1 unit of PRBC to help to raise BP and improving her recovery from stroke. -Check iron level and reticulocyte count, continue iron supplement.  Menorrhagia -Continue Megace, smoking cessation consulted.  Cigarette smoker -On nicotine supplement  DVT prophylaxis: Lovenox Code Status: Full code Family Communication: Fianc at bedside Disposition Plan: Given the significant impairment of mobility, expect more than 2 midnight hospital stay, expect discharge to the inpatient rehab Consults called: Neurology Admission status: Telemetry admission   Lequita Halt MD [facility] Pager [phone]  10/26/2021, 1:33 PM

## 2021-10-26 NOTE — Code Documentation (Signed)
Stroke Response Nurse Documentation Code Documentation  Megan Chaney is a 51 y.o. female arriving to [facility] ED via [facility] on 10/26/21 with past medical hx of GERD. On no known antithrombotic. Code stroke was activated by EMS.   Patient from work ([employer]) where she arrived to work her in her normal state though she told coworkers she "felt off" and coworkers then found her lying in the floor around 0900. EMS activated. At this time, patient unable to move right side of her body, right facial droop, globally aphasic.   Stroke team at the bedside on patient arrival. Labs drawn and patient cleared for CT by Dr. Dina Rich. Patient to CT with team. NIHSS 20, see documentation for details and code stroke times. Patient with disoriented, not following commands, right facial droop, bilateral arm weakness, bilateral leg weakness, Global aphasia , and dysarthria  on exam.   The following imaging was completed: CT, CTA head and neck, CTP. Patient is not a candidate for IV Thrombolytic due to unknown LKW. Patient is not a candidate for IR due to no LVO.   Care/Plan: q2h mNIHSS x12h then q4h, permissive HTN <220/110.   Bedside handoff with ED RN Burman Nieves.    Candace Cruise K  Stroke Response RN

## 2021-10-26 NOTE — ED Notes (Signed)
Spoke with pt's sister and provided update. Sister requesting that information not be given out except to boyfriend, sister, and niece. Sister is making all medical decisions at this time. No acute changes noted. Will continue to monitor.

## 2021-10-26 NOTE — ED Notes (Signed)
Patient transported to MRI 

## 2021-10-26 NOTE — Evaluation (Signed)
Speech Language Pathology Evaluation Patient Details Name: Markea Ruzich MRN: [id number] DOB: 30-Sep-1971 Today's Date: 10/26/2021 Time: 9678-9381 SLP Time Calculation (min) (ACUTE ONLY): 11 min  Problem List:  Patient Active Problem List   Diagnosis Date Noted   Stroke (cerebrum) ([place]) 10/26/2021   Infertility counseling 10/04/2019   Enlarged uterus 07/31/2018   Uterine leiomyoma 07/31/2018   DUB (dysfunctional uterine bleeding) 07/31/2018   Past Medical History:  Past Medical History:  Diagnosis Date   Anemia    Fibroid    GERD (gastroesophageal reflux disease)    Past Surgical History:  Past Surgical History:  Procedure Laterality Date   MULTIPLE TOOTH EXTRACTIONS     NO PAST SURGERIES     HPI:  51 y.o. female presented to ED with aphasia and right sided weakness. MRI: Multifocal acute/subacute nonhemorrhagic infarcts involving the left MCA territory. Findings are predominantly inferior and posteriorly. There is cortical infarction also involves the more anterior middle and inferior frontal lobes. Prior medical history significant for chronic iron deficiency anemia secondary to fibroids and menorrhagia on hormone therapy, cigar smoker, GERD.   Assessment / Plan / Recommendation Clinical Impression  Ms. Vigna presents with severe expressive >receptive aphasia.  She was participatory, engaging and putting forth effort to talk without much success.  She did not follow commands; unable to discriminate between two functional objects; unable to produce automatic sequences on demand (eg., counting, singing).  She responded to most yes/no questions with an immediate head nod yes, but was able to differentiate a no response correctly on two occasions. Efforts at naming were limited to a perseveratory "okay" that initially could not be altered. She spontaneously produced "that's good" after eating a bite of applesauce and said "thank you" as I was leaving.  Provided encouragement to Ms  Calkin and gave her a simple explanation of her speech difficulties.  Her pastor was present; her boyfriend, Broadus John, had just left to go pick up the pt's niece and bring her back to the hospital.  Family situation/support unknown at this time. SLP will follow for speech/swallowing therapy.    SLP Assessment  SLP Recommendation/Assessment: Patient needs continued Speech [place] [facility] SLP Visit Diagnosis: Aphasia (R47.01)    Recommendations for follow up therapy are one component of a multi-disciplinary discharge planning process, led by the attending physician.  Recommendations may be updated based on patient status, additional functional criteria and insurance authorization.    Follow Up Recommendations  Acute inpatient rehab (3hours/day)    Assistance Recommended at Discharge  Frequent or constant Supervision/Assistance  Functional Status Assessment Patient has had a recent decline in their functional status and demonstrates the ability to make significant improvements in function in a reasonable and predictable amount of time.  Frequency and Duration min 3x week  2 weeks      SLP Evaluation Cognition  Overall Cognitive Status: Impaired/Different from baseline       Comprehension  Auditory Comprehension Overall Auditory Comprehension: Impaired Yes/No Questions: Impaired Basic Biographical Questions: 51-75% accurate Commands: Impaired One Step Basic Commands: 0-24% accurate Visual Recognition/Discrimination Discrimination: Exceptions to [facility] Common Objects: Unable to indentify Reading Comprehension Reading Status: Not tested    Expression Verbal Expression Overall Verbal Expression: Impaired Initiation: Impaired Automatic Speech:  (unable) Level of Generative/Spontaneous Verbalization: Word Repetition: Impaired Level of Impairment: Word level Naming: Impairment Confrontation: Impaired Common Objects: Unable to indentify Pragmatics: No impairment Written  Expression Dominant Hand: Right   Oral / Motor  Oral Motor/Sensory Function Overall Oral Motor/Sensory Function: Other (  comment) (mild right CN VII deficit) Motor Speech Overall Motor Speech: Impaired Motor Planning: Impaired Level of Impairment: Word            Assunta Curtis 10/26/2021, 4:37 PM  Estill Bamberg L. Tivis Ringer, [place] Office number [phone] Pager [phone]

## 2021-10-26 NOTE — Progress Notes (Signed)
LTM setup and started in ER40. [facility] was callled. No skin breakdown noted. Results pending.

## 2021-10-27 ENCOUNTER — Encounter (HOSPITAL_COMMUNITY): Payer: Self-pay | Admitting: Internal Medicine

## 2021-10-27 ENCOUNTER — Inpatient Hospital Stay (HOSPITAL_COMMUNITY): Payer: BLUE CROSS/BLUE SHIELD

## 2021-10-27 DIAGNOSIS — I6389 Other cerebral infarction: Secondary | ICD-10-CM

## 2021-10-27 DIAGNOSIS — R4701 Aphasia: Secondary | ICD-10-CM

## 2021-10-27 DIAGNOSIS — I639 Cerebral infarction, unspecified: Secondary | ICD-10-CM

## 2021-10-27 LAB — ECHOCARDIOGRAM COMPLETE BUBBLE STUDY
AR max vel: 1.83 cm2
AV Area VTI: 1.74 cm2
AV Area mean vel: 1.6 cm2
AV Mean grad: 5 mmHg
AV Peak grad: 8.2 mmHg
Ao pk vel: 1.43 m/s

## 2021-10-27 LAB — TYPE AND SCREEN
ABO/RH(D): O POS
Antibody Screen: NEGATIVE
Unit division: 0

## 2021-10-27 LAB — RESP PANEL BY RT-PCR (FLU A&B, COVID) ARPGX2
Influenza A by PCR: NEGATIVE
Influenza B by PCR: NEGATIVE
SARS Coronavirus 2 by RT PCR: NEGATIVE

## 2021-10-27 LAB — LIPID PANEL
Cholesterol: 145 mg/dL (ref 0–200)
HDL: 52 mg/dL (ref >40)
LDL Cholesterol: 79 mg/dL (ref 0–99)
Total CHOL/HDL Ratio: 2.8 RATIO
Triglycerides: 69 mg/dL (ref <150)
VLDL: 14 mg/dL (ref 0–40)

## 2021-10-27 LAB — HEMOGLOBIN AND HEMATOCRIT, BLOOD
HCT: 24.5 % — ABNORMAL LOW (ref 36.0–46.0)
Hemoglobin: 7.5 g/dL — ABNORMAL LOW (ref 12.0–15.0)

## 2021-10-27 LAB — BPAM RBC
Blood Product Expiration Date: 202301252359
ISSUE DATE / TIME: 202301022136
Unit Type and Rh: 5100

## 2021-10-27 MED ORDER — ASPIRIN 81 MG PO CHEW
81.0000 mg | CHEWABLE_TABLET | Freq: Every day | ORAL | Status: DC
  Administered 2021-10-27 – 2021-10-31 (×5): 81 mg via ORAL
  Filled 2021-10-27 (×6): qty 1

## 2021-10-27 MED ORDER — VITAMIN D (ERGOCALCIFEROL) 1.25 MG (50000 UNIT) PO CAPS
50000.0000 [IU] | ORAL_CAPSULE | ORAL | Status: DC
  Administered 2021-10-28: 09:00:00 50000 [IU] via ORAL
  Filled 2021-10-27: qty 1

## 2021-10-27 NOTE — Procedures (Signed)
Patient Name: Megan Chaney  MRN: [id number]  Epilepsy Attending: Lora Havens  Referring Physician/Provider: Dr Su Monks Date 10/26/2021  Duration: 21.09 mins  Patient history: 51 year old woman with a past medical his history significant for fibroids who presented as a pretty activated stroke code with EMS for aphasia and right-sided weakness c/f acute ischemic stroke. EEG to evaluate for seizure  Level of alertness: Awake  AEDs during EEG study: None  Technical aspects: This EEG study was done with scalp electrodes positioned according to the 10-20 International system of electrode placement. Electrical activity was acquired at a sampling rate of 500Hz  and reviewed with a high frequency filter of 70Hz  and a low frequency filter of 1Hz . EEG data were recorded continuously and digitally stored.   Description: The posterior dominant rhythm consists of 10 Hz activity of moderate voltage (25-35 uV) seen predominantly in posterior head regions, asymmetric (left<right) and reactive to eye opening and eye closing.  Hyperventilation and photic stimulation were not performed.     ABNORMALITY - Background asymmetry, left<right  IMPRESSION: This study is suggestive of cortical dysfunction arising from left occipital region likely secondary to underlying structural abnormality/stroke. No seizures or epileptiform discharges were seen throughout the recording.  Megan Chaney [name]

## 2021-10-27 NOTE — Progress Notes (Addendum)
STROKE TEAM PROGRESS NOTE   INTERVAL HISTORY Patient is seen in her room with her fiance at. the bedside.  He states that yesterday, she had weakness and difficulty speaking after she woke up, but returned to baseline after a short period of time and left for work.  Patient was found on the floor in the break room at her job and was then sent to the hospital with aphasia and right sided weakness.  She was found to have multiple acute infarcts in the left MCA territory on MRI.  CT angiogram of the brain and neck did not show significant large vessel stenosis or occlusion.  Urine drug screen is positive for marijuana.  LDL cholesterol 79 mg percent.  Hemoglobin A1c is 4.3.  EEG as well as overnight long-term EEG monitoring is negative for any seizures.  She continues to have expressive aphasia which is unchanged  Vitals:   10/27/21 1100 10/27/21 1130 10/27/21 1230 10/27/21 1300  BP: (!) 172/99 (!) 162/94 (!) 135/117 140/82  Pulse: (!) 104 (!) 110 (!) 101 95  Resp: 14 14 (!) 28 (!) 26  Temp:      TempSrc:      SpO2: 100% 100% 100% 100%  Weight:       CBC:  Recent Labs  Lab 10/26/21 0940 10/26/21 0948 10/27/21 0326  WBC 14.0*  --   --   NEUTROABS 11.2*  --   --   HGB 7.2* 8.2* 7.5*  HCT 24.2* 24.0* 24.5*  MCV 85.5  --   --   PLT 457*  --   --    Basic Metabolic Panel:  Recent Labs  Lab 10/26/21 0940 10/26/21 0948  NA 138 138  K 4.0 3.9  CL 107 107  CO2 21*  --   GLUCOSE 118* 116*  BUN 15 15  CREATININE 0.64 0.60  CALCIUM 8.8*  --    Lipid Panel:  Recent Labs  Lab 10/27/21 0326  CHOL 145  TRIG 69  HDL 52  CHOLHDL 2.8  VLDL 14  LDLCALC 79   HgbA1c:  Recent Labs  Lab 10/26/21 0940  HGBA1C 4.3*   Urine Drug Screen:  Recent Labs  Lab 10/26/21 1239  LABOPIA NONE DETECTED  COCAINSCRNUR NONE DETECTED  LABBENZ NONE DETECTED  AMPHETMU NONE DETECTED  THCU POSITIVE*  LABBARB NONE DETECTED    Alcohol Level No results for input(s): ETH in the last 168  hours.  IMAGING past 24 hours EEG adult  Result Date: 10/27/2021 Lora Havens, MD     10/27/2021  7:42 AM Patient Name: Megan Chaney MRN: [id number] Epilepsy Attending: Lora Havens Referring Physician/Provider: Dr Su Monks Date 10/26/2021 Duration: 21.09 mins Patient history: 51 year old woman with a past medical his history significant for fibroids who presented as a pretty activated stroke code with EMS for aphasia and right-sided weakness c/f acute ischemic stroke. EEG to evaluate for seizure Level of alertness: Awake AEDs during EEG study: None Technical aspects: This EEG study was done with scalp electrodes positioned according to the 10-20 International system of electrode placement. Electrical activity was acquired at a sampling rate of 500Hz  and reviewed with a high frequency filter of 70Hz  and a low frequency filter of 1Hz . EEG data were recorded continuously and digitally stored. Description: The posterior dominant rhythm consists of 10 Hz activity of moderate voltage (25-35 uV) seen predominantly in posterior head regions, asymmetric (left<right) and reactive to eye opening and eye closing.  Hyperventilation and photic stimulation were not performed.  ABNORMALITY - Background asymmetry, left<right IMPRESSION: This study is suggestive of cortical dysfunction arising from left occipital region likely secondary to underlying structural abnormality/stroke. No seizures or epileptiform discharges were seen throughout the recording. Priyanka [name]   Overnight EEG with video  Result Date: 10/27/2021 Lora Havens, MD     10/27/2021  9:47 AM Patient Name: Megan Chaney MRN: [id number] Epilepsy Attending: Lora Havens Referring Physician/Provider: Dr Su Monks Duration: 10/26/2021 9563 to 10/27/2021 0848  Patient history: 51 year old woman with a past medical his history significant for fibroids who presented as a pretty activated stroke code with EMS for aphasia and right-sided  weakness c/f acute ischemic stroke. EEG to evaluate for seizure  Level of alertness: Awake, asleep  AEDs during EEG study: None  Technical aspects: This EEG study was done with scalp electrodes positioned according to the 10-20 International system of electrode placement. Electrical activity was acquired at a sampling rate of 500Hz  and reviewed with a high frequency filter of 70Hz  and a low frequency filter of 1Hz . EEG data were recorded continuously and digitally stored.  Description: The posterior dominant rhythm consists of 10 Hz activity of moderate voltage (25-35 uV) seen predominantly in posterior head regions, asymmetric (left<right) and reactive to eye opening and eye closing.  Hyperventilation and photic stimulation were not performed.    ABNORMALITY - Background asymmetry, left<right  IMPRESSION: This study is suggestive of cortical dysfunction arising from left occipital region likely secondary to underlying structural abnormality/stroke. No seizures or epileptiform discharges were seen throughout the recording.  Priyanka O Yadav   VAS [place] TRANSCRANIAL DOPPLER W BUBBLES  Result Date: 10/27/2021  Transcranial Doppler with Bubble Patient Name:  Megan Chaney  Date of Exam:   10/27/2021 Medical Rec #: [id number]        Accession #:    [id number] Date of Birth: June 11, 1971        Patient Gender: F Patient Age:   51 years Exam Location:  [facility] Procedure:      VAS [place] TRANSCRANIAL DOPPLER W BUBBLES Referring Phys: Elwin Sleight DE LA TORRE --------------------------------------------------------------------------------  Indications: Stroke. Comparison Study: No prior studies. Performing Technologist: Darlin Coco RDMS, RVT  Examination Guidelines: A complete evaluation includes B-mode imaging, spectral Doppler, color Doppler, and power Doppler as needed of all accessible portions of each vessel. Bilateral testing is considered an integral part of a complete examination. Limited examinations for  reoccurring indications may be performed as noted.  Summary: No HITS at rest or during Valsalva. Negative transcranial Doppler Bubble study with no evidence of right to left intracardiac communication.  *See table(s) above for TCD measurements and observations.    Preliminary    ECHOCARDIOGRAM COMPLETE BUBBLE STUDY  Result Date: 10/27/2021    ECHOCARDIOGRAM REPORT   Patient Name:   Megan Chaney Date of Exam: 10/27/2021 Medical Rec #:  [id number]       Height:       63.0 in Accession #:    [id number]      Weight:       161.8 lb Date of Birth:  17-Dec-1970       BSA:          1.767 m Patient Age:    28 years        BP:           132/98 mmHg Patient Gender: F               HR:  97 bpm. Exam Location:  Inpatient Procedure: 2D Echo, Cardiac Doppler, Color Doppler and Intracardiac            Opacification Agent Indications:    Stroke, bubble  History:        Patient has no prior history of Echocardiogram examinations.  Sonographer:    Glo Herring Referring Phys: [place]  1. Left ventricular ejection fraction, by estimation, is 60 to 65%. The left ventricle has normal function. The left ventricle has no regional wall motion abnormalities. Left ventricular diastolic parameters were normal.  2. Right ventricular systolic function is normal. The right ventricular size is normal.  3. The mitral valve is normal in structure. No evidence of mitral valve regurgitation. No evidence of mitral stenosis.  4. The aortic valve is tricuspid. Aortic valve regurgitation is not visualized. No aortic stenosis is present.  5. The inferior vena cava is normal in size with greater than 50% respiratory variability, suggesting right atrial pressure of 3 mmHg.  6. Agitated saline contrast bubble study was negative, with no evidence of any interatrial shunt. FINDINGS  Left Ventricle: Left ventricular ejection fraction, by estimation, is 60 to 65%. The left ventricle has normal function. The left ventricle  has no regional wall motion abnormalities. The left ventricular internal cavity size was normal in size. There is  no left ventricular hypertrophy. Left ventricular diastolic parameters were normal. Right Ventricle: The right ventricular size is normal. No increase in right ventricular wall thickness. Right ventricular systolic function is normal. Left Atrium: Left atrial size was normal in size. Right Atrium: Right atrial size was normal in size. Pericardium: There is no evidence of pericardial effusion. Mitral Valve: The mitral valve is normal in structure. No evidence of mitral valve regurgitation. No evidence of mitral valve stenosis. Tricuspid Valve: The tricuspid valve is normal in structure. Tricuspid valve regurgitation is trivial. No evidence of tricuspid stenosis. Aortic Valve: The aortic valve is tricuspid. Aortic valve regurgitation is not visualized. No aortic stenosis is present. Aortic valve mean gradient measures 5.0 mmHg. Aortic valve peak gradient measures 8.2 mmHg. Aortic valve area, by VTI measures 1.74 cm. Pulmonic Valve: The pulmonic valve was not well visualized. Pulmonic valve regurgitation is trivial. No evidence of pulmonic stenosis. Aorta: The aortic root is normal in size and structure. Venous: The inferior vena cava is normal in size with greater than 50% respiratory variability, suggesting right atrial pressure of 3 mmHg. IAS/Shunts: No atrial level shunt detected by color flow Doppler. Agitated saline contrast was given intravenously to evaluate for intracardiac shunting. Agitated saline contrast bubble study was negative, with no evidence of any interatrial shunt.  LEFT VENTRICLE PLAX 2D LVOT diam:     2.00 cm LV SV:         46 LV SV Index:   26 LVOT Area:     3.14 cm  IVC IVC diam: 1.30 cm LEFT ATRIUM           Index LA Vol (A2C): 31.9 ml 18.05 ml/m LA Vol (A4C): 48.3 ml 27.33 ml/m  AORTIC VALVE                     PULMONIC VALVE AV Area (Vmax):    1.83 cm      PV Vmax:        0.99 m/s AV Area (Vmean):   1.60 cm      PV Peak grad:  3.9 mmHg AV Area (VTI):  1.74 cm AV Vmax:           143.00 cm/s AV Vmean:          102.000 cm/s AV VTI:            0.265 m AV Peak Grad:      8.2 mmHg AV Mean Grad:      5.0 mmHg LVOT Vmax:         83.30 cm/s LVOT Vmean:        51.900 cm/s LVOT VTI:          0.147 m LVOT/AV VTI ratio: 0.55  AORTA Ao Root diam: 2.90 cm Ao Asc diam:  2.90 cm  SHUNTS Systemic VTI:  0.15 m Systemic Diam: 2.00 cm Kardie Tobb DO Electronically signed by Berniece Salines DO Signature Date/Time: 10/27/2021/3:32:16 PM    Final    VAS [place] LOWER EXTREMITY VENOUS (DVT)  Result Date: 10/27/2021  Lower Venous DVT Study Patient Name:  Megan Chaney  Date of Exam:   10/27/2021 Medical Rec #: [id number]        Accession #:    [id number] Date of Birth: June 15, 1971        Patient Gender: F Patient Age:   68 years Exam Location:  [facility] Procedure:      VAS [place] LOWER EXTREMITY VENOUS (DVT) Referring Phys: Elwin Sleight DE LA TORRE --------------------------------------------------------------------------------  Indications: Stroke.  Comparison Study: No prior studies. Performing Technologist: Darlin Coco RDMS, RVT  Examination Guidelines: A complete evaluation includes B-mode imaging, spectral Doppler, color Doppler, and power Doppler as needed of all accessible portions of each vessel. Bilateral testing is considered an integral part of a complete examination. Limited examinations for reoccurring indications may be performed as noted. The reflux portion of the exam is performed with the patient in reverse Trendelenburg.  +---------+---------------+---------+-----------+----------+--------------+  RIGHT     Compressibility Phasicity Spontaneity Properties Thrombus Aging  +---------+---------------+---------+-----------+----------+--------------+  CFV       Full            Yes       Yes                                    +---------+---------------+---------+-----------+----------+--------------+   SFJ       Full                                                             +---------+---------------+---------+-----------+----------+--------------+  FV Prox   Full                                                             +---------+---------------+---------+-----------+----------+--------------+  FV Mid    Full                                                             +---------+---------------+---------+-----------+----------+--------------+  FV  Distal Full                                                             +---------+---------------+---------+-----------+----------+--------------+  PFV       Full                                                             +---------+---------------+---------+-----------+----------+--------------+  POP       Full            Yes       Yes                                    +---------+---------------+---------+-----------+----------+--------------+  PTV       Full                                                             +---------+---------------+---------+-----------+----------+--------------+  PERO      Full                                                             +---------+---------------+---------+-----------+----------+--------------+   +---------+---------------+---------+-----------+----------+--------------+  LEFT      Compressibility Phasicity Spontaneity Properties Thrombus Aging  +---------+---------------+---------+-----------+----------+--------------+  CFV       Full            Yes       Yes                                    +---------+---------------+---------+-----------+----------+--------------+  SFJ       Full                                                             +---------+---------------+---------+-----------+----------+--------------+  FV Prox   Full                                                             +---------+---------------+---------+-----------+----------+--------------+  FV Mid    Full                                                              +---------+---------------+---------+-----------+----------+--------------+  FV Distal Full                                                             +---------+---------------+---------+-----------+----------+--------------+  PFV       Full                                                             +---------+---------------+---------+-----------+----------+--------------+  POP       Full            Yes       Yes                                    +---------+---------------+---------+-----------+----------+--------------+  PTV       Full                                                             +---------+---------------+---------+-----------+----------+--------------+  PERO      Full                                                             +---------+---------------+---------+-----------+----------+--------------+     Summary: RIGHT: - There is no evidence of deep vein thrombosis in the lower extremity.  - No cystic structure found in the popliteal fossa.  LEFT: - There is no evidence of deep vein thrombosis in the lower extremity.  - No cystic structure found in the popliteal fossa.  *See table(s) above for measurements and observations.    Preliminary     PHYSICAL EXAM General:  Alert, obese middle-aged African-American female in no acute distress    NEURO:  Mental Status: AA&Ox3  Speech/Language: speech is with expressive aphasia with nonfluent speech and difficulty with word finding difficulty..  Patient is unable to name objects and has impaired repetition.  Good comprehension  Cranial Nerves:  II: PERRL. Right partial peripheral visual field cut III, IV, VI: EOMI. Eyelids elevate symmetrically.  V: Sensation is intact to light touch and symmetrical to face.  VII: Smile is symmetrical. Able to puff cheeks and raise eyebrows.  VIII: hearing intact to voice. IX, X: Phonation is normal.  XII: right sided tongue deviation Motor: 5/5 strength to LUE and LLE,  0/5 to RUE, 3/5 hand RLE 4/5 strength. Sensation- Intact to light touch bilaterally. Extinction absent to light touch to DSS.   Coordination: FTN intact to left Gait- deferred   ASSESSMENT/PLAN Megan Chaney is a 51 y.o. female with no significant past medical history presenting with aphasia and right sided weakness.  She was found to have multiple acute infarcts in the left MCA territory on MRI.  Stroke: Left middle  cerebral artery branch infarct likely secondary due to embolism of cryptogenic source Code Stroke CT head No acute abnormality.  ASPECTS 10.    CTA head & neck No emergent LVO, ischemic range blood flow in frontal lobe, no significant atheromatous disease in the neck MRI  multifocal acute/subacute infarcts in left MCA territory 2D Echo EF 60-65%, no interatrial shunt TCD bubble study negative for right-to-left shunt Lower extremity doppler no evidence of DVT LDL 79 HgbA1c 4.3 VTE prophylaxis - lovenox    Diet   DIET - DYS 1 Room service appropriate? No; Fluid consistency: Thin   No antithrombotic prior to admission, now on aspirin 81 mg and Plavix 75 mg daily.  For 3 months followed by aspirin alone Therapy recommendations:  CIR Disposition:  pending  Hypertension Home meds:  none Stable Permissive hypertension (OK if < 220/120) but gradually normalize in 5-7 days Long-term BP goal normotensive  Hyperlipidemia Home meds:  none LDL 79, goal < 70 Add atorvastatin 40 mg daily  Continue statin at discharge   Other Stroke Risk Factors Family hx stroke (mother, in her 62s)   Other Active Problems none  Hospital day # [place] , MSN, AGACNP-BC [facility] See Amion for schedule and pager information 10/27/2021 4:01 PM   STROKE MD NOTE :  I have personally obtained history,examined this patient, reviewed notes, independently viewed imaging studies, participated in medical decision making and plan of care.ROS completed by me  personally and pertinent positives fully documented  I have made any additions or clarifications directly to the above note. Agree with note above.  Patient presented with expressive aphasia due to left MCA branch infarct likely of cryptogenic etiology.  She unfortunately presented outside candidate for thrombolysis and was not a candidate for mechanical thrombectomy.  Recommend ongoing stroke work-up by checking TCD bubble study for PFO, TEE for cardiac source of embolism hypercoagulable panel and vasculitic labs.  Aspirin and Plavix for 3 weeks followed by aspirin along with aggressive risk factor modification.  Physical, occupational and speech therapy consults.  Patient counseled to quit marijuana and cigarettes and voiced agreement.  Long discussion with patient and fianc at the bedside and answered questions.  Greater than 50% time during this prolonged 50-minute visit was spent on counseling and coordination of care and discussion about stroke prevention and evaluation and treatment and answering questions.  Antony Contras, MD Medical Director [facility] Pager: [phone] 10/27/2021 5:33 PM   To contact Stroke Continuity provider, please refer to [email or web address] After hours, contact General Neurology

## 2021-10-27 NOTE — Progress Notes (Signed)
Arrived to patient's room. Nurse reports no complications with current PIV. Reports able to flush and get BR. Notified to consult IV team with any there needs or concerns. VU. Fran Lowes, RN VAST

## 2021-10-27 NOTE — Progress Notes (Signed)
Speech Language Pathology Treatment: Dysphagia;Cognitive-Linquistic  Patient Details Name: Megan Chaney MRN: [id number] DOB: 1971/08/23 Today's Date: 10/27/2021 Time: 1829-9371 SLP Time Calculation (min) (ACUTE ONLY): 15 min  Assessment / Plan / Recommendation Clinical Impression  Pt's spontaneous speech is much more varied today.  She continues with expressive >receptive aphasia, but today is producing more automatic sequences and making efforts to produce sentences,  Speech is nonfluent and marked by phonemic paraphasias.  Demonstrates excellent recognition of errors and consistent attempts to self-correct. Provided with advance solid food choices to see if her diet can be advanced from purees.  She continues to have difficulty with mastication and pocketing with some sensory deficits in right lateral sulcus.  Recommend advancing diet to dysphagia 2 for now - pt agreed with plan. Session cut short as transport had arrived to take her to the floor. Pt is excellent candidate for inpatient rehab.  SLP will follow.  HPI HPI: 51 y.o. female presented to ED with aphasia and right sided weakness. MRI: Multifocal acute/subacute nonhemorrhagic infarcts involving the left MCA territory. Findings are predominantly inferior and posteriorly. There is cortical infarction also involves the more anterior middle and inferior frontal lobes. Prior medical history significant for chronic iron deficiency anemia secondary to fibroids and menorrhagia on hormone therapy, cigar smoker, GERD.      SLP Plan  Continue with current plan of care      Recommendations for follow up therapy are one component of a multi-disciplinary discharge planning process, led by the attending physician.  Recommendations may be updated based on patient status, additional functional criteria and insurance authorization.    Recommendations  Diet recommendations: Dysphagia 2 (fine chop);Thin liquid Liquids provided via:  Cup;Straw Medication Administration: Whole meds with puree Supervision: Patient able to self feed Postural Changes and/or Swallow Maneuvers: Seated upright 90 degrees                Oral Care Recommendations: Oral care BID Follow Up Recommendations: Acute inpatient rehab (3hours/day) Assistance recommended at discharge: Frequent or constant Supervision/Assistance SLP Visit Diagnosis: Aphasia (R47.01) Plan: Continue with current plan of care         Megan Chaney L. Tivis Ringer, [place] Office number [phone] Pager [phone]   Megan Chaney  10/27/2021, 4:22 PM

## 2021-10-27 NOTE — Progress Notes (Signed)
LTM discontinued at bedside. No skin breakdown noted. Results pending.  °

## 2021-10-27 NOTE — ED Notes (Signed)
Echo at the bedside °

## 2021-10-27 NOTE — ED Notes (Signed)
Patient transported to Vascular Ultrasound

## 2021-10-27 NOTE — Procedures (Addendum)
Patient Name: Megan Chaney  MRN: [id number]  Epilepsy Attending: Lora Havens  Referring Physician/Provider: Dr Su Monks Duration: 10/26/2021 5670 to 10/27/2021 0848   Patient history: 51 year old woman with a past medical his history significant for fibroids who presented as a pretty activated stroke code with EMS for aphasia and right-sided weakness c/f acute ischemic stroke. EEG to evaluate for seizure   Level of alertness: Awake, asleep   AEDs during EEG study: None   Technical aspects: This EEG study was done with scalp electrodes positioned according to the 10-20 International system of electrode placement. Electrical activity was acquired at a sampling rate of 500Hz  and reviewed with a high frequency filter of 70Hz  and a low frequency filter of 1Hz . EEG data were recorded continuously and digitally stored.    Description: The posterior dominant rhythm consists of 10 Hz activity of moderate voltage (25-35 uV) seen predominantly in posterior head regions, asymmetric (left<right) and reactive to eye opening and eye closing.  Hyperventilation and photic stimulation were not performed.      ABNORMALITY - Background asymmetry, left<right   IMPRESSION: This study is suggestive of cortical dysfunction arising from left occipital region likely secondary to underlying structural abnormality/stroke. No seizures or epileptiform discharges were seen throughout the recording.   Markie Frith [name]

## 2021-10-27 NOTE — ED Notes (Signed)
Changed pt gown and linens. Provided warm blankets.

## 2021-10-27 NOTE — TOC CAGE-AID Note (Signed)
Transition of Care [facility]) - CAGE-AID Screening   Patient Details  Name: Megan Chaney MRN: [id number] Date of Birth: 25-Nov-1970  Transition of Care [facility]) CM/SW Contact:    Per Beagley C Tarpley-Carter, [place] Phone Number: 10/27/2021, 9:23 AM   Clinical Narrative: Pt is unable to participate in Cage Aid.  Substance was detected.  CSW will provide pt with resources for possible future use.  Dashia Caldeira Tarpley-Carter, MSW, LCSW-A Pronouns:  She/Her/Hers  Transitions of Care Clinical Social Worker Direct Number:  [phone] [email or web address]  CAGE-AID Screening: Substance Abuse Screening unable to be completed due to: : Patient unable to participate             Substance Abuse Education Offered: Yes  Substance abuse interventions: Scientist, clinical (histocompatibility and immunogenetics)

## 2021-10-27 NOTE — ED Notes (Signed)
Pt significant other at bedside feeding pt lunch.

## 2021-10-27 NOTE — Progress Notes (Signed)
No seizures on routine EEG  Su Monks, MD [facility] [phone]  If 7pm- 7am, please page neurology on call as listed in [place].

## 2021-10-27 NOTE — Evaluation (Signed)
Physical Therapy Evaluation Patient Details Name: Megan Chaney MRN: [id number] DOB: 06/29/1971 Today's Date: 10/27/2021  History of Present Illness  Pt is a 50 y/o female admitted after being found down at work with aphasia and R sided weakness. Pt found to have Multifocal acute/subacute nonhemorrhagic infarcts involving the  left MCA territory. PMH includes chronic iron deficiency anemia secondary to fibroids and menorrhagia on hormone therapy.  Clinical Impression  Pt admitted secondary to problem above with deficits below. Pt requiring min to mod A +2 to stand and transfer to/from chair this session. Required manual blocking of RLE and noted weakness throughout RUE as well. Pt with expressive difficulty throughout, but followed commands fairly well.  Pt previously independent and feel she will be an excellent candidate for acute inpatient rehab level therapies. Will continue to follow acutely.      Recommendations for follow up therapy are one component of a multi-disciplinary discharge planning process, led by the attending physician.  Recommendations may be updated based on patient status, additional functional criteria and insurance authorization.  Follow Up Recommendations Acute inpatient rehab (3hours/day)    Assistance Recommended at Discharge Frequent or constant Supervision/Assistance  Patient can return home with the following  A lot of help with walking and/or transfers;Two people to help with walking and/or transfers;Two people to help with bathing/dressing/bathroom;Assist for transportation;Help with stairs or ramp for entrance;Assistance with Forensic psychologist cushion (measurements PT);Wheelchair (measurements PT)  Recommendations for Other Services       Functional Status Assessment Patient has had a recent decline in their functional status and demonstrates the ability to make significant improvements in function in a reasonable and  predictable amount of time.     Precautions / Restrictions Precautions Precautions: Fall Restrictions Weight Bearing Restrictions: No      Mobility  Bed Mobility Overal bed mobility: Needs Assistance Bed Mobility: Supine to Sit;Sit to Supine     Supine to sit: Min assist Sit to supine: Min assist   General bed mobility comments: Required assist for RLE and RUE management.    Transfers Overall transfer level: Needs assistance Equipment used: 2 person hand held assist Transfers: Sit to/from Stand;Bed to chair/wheelchair/BSC Sit to Stand: Min assist;+2 safety/equipment;+2 physical assistance   Step pivot transfers: Mod assist;+2 physical assistance;+2 safety/equipment       General transfer comment: Min A for steadying assist to stand. Mod A +2 to take steps to/from chair for clean up. Required manual blocking of RLE.    Ambulation/Gait                  Stairs            Wheelchair Mobility    Modified Rankin (Stroke Patients Only)       Balance Overall balance assessment: Needs assistance Sitting-balance support: No upper extremity supported;Feet supported Sitting balance-Leahy Scale: Fair     Standing balance support: Single extremity supported Standing balance-Leahy Scale: Poor Standing balance comment: reliant on external support                             Pertinent Vitals/Pain Pain Assessment: Faces Faces Pain Scale: No hurt    Home Living Family/patient expects to be discharged to:: Private residence Living Arrangements: Spouse/significant other Available Help at Discharge: Family;Available 24 hours/day Type of Home: Apartment Home Access: Stairs to enter Entrance Stairs-Rails: Right;Left Entrance Stairs-Number of Steps: 4   Home Layout: One level Home  Equipment: None      Prior Function Prior Level of Function : Independent/Modified Independent;Working/employed                     Hand Dominance    Dominant Hand: Right    Extremity/Trunk Assessment   Upper Extremity Assessment Upper Extremity Assessment: Defer to OT evaluation    Lower Extremity Assessment Lower Extremity Assessment: RLE deficits/detail RLE Deficits / Details: Able to perform heel slide and ankle pump in supine. Decreased sensation reported. Buckling noted during stance RLE Sensation: decreased light touch    Cervical / Trunk Assessment Cervical / Trunk Assessment: Normal  Communication   Communication: Expressive difficulties  Cognition Arousal/Alertness: Awake/alert Behavior During Therapy: WFL for tasks assessed/performed Overall Cognitive Status: Difficult to assess                                 General Comments: Able to follow commands this session, however, somewhat apraxic        General Comments General comments (skin integrity, edema, etc.): Pt's fiance present    Exercises     Assessment/Plan    PT Assessment Patient needs continued PT services  PT Problem List Decreased strength;Decreased balance;Decreased activity tolerance;Decreased mobility;Decreased knowledge of use of DME;Decreased knowledge of precautions;Decreased safety awareness       PT Treatment Interventions DME instruction;Gait training;Functional mobility training;Therapeutic activities;Therapeutic exercise;Balance training;Patient/family education;Neuromuscular re-education    PT Goals (Current goals can be found in the Care Plan section)  Acute Rehab PT Goals Patient Stated Goal: to go home PT Goal Formulation: With patient Time For Goal Achievement: 11/10/21 Potential to Achieve Goals: Good    Frequency Min 4X/week     Co-evaluation               AM-PAC PT "6 Clicks" Mobility  Outcome Measure Help needed turning from your back to your side while in a flat bed without using bedrails?: A Little Help needed moving from lying on your back to sitting on the side of a flat bed without using  bedrails?: A Little Help needed moving to and from a bed to a chair (including a wheelchair)?: Total Help needed standing up from a chair using your arms (e.g., wheelchair or bedside chair)?: A Lot Help needed to walk in hospital room?: Total Help needed climbing 3-5 steps with a railing? : Total 6 Click Score: 11    End of Session Equipment Utilized During Treatment: Gait belt Activity Tolerance: Patient tolerated treatment well Patient left: in bed;with call bell/phone within reach;with nursing/sitter in room;with family/visitor present (on stretcher in ED) Nurse Communication: Mobility status PT Visit Diagnosis: Other abnormalities of gait and mobility (R26.89);Muscle weakness (generalized) (M62.81)    Time: 1191-4782 PT Time Calculation (min) (ACUTE ONLY): 30 min   Charges:   PT Evaluation $PT Eval Moderate Complexity: 1 Mod          Megan Chaney, PT, DPT  Acute Rehabilitation Services  Pager: [phone] Office: [phone]   Megan Chaney 10/27/2021, 1:49 PM

## 2021-10-27 NOTE — ED Notes (Addendum)
PT informed RN pt brief was changed and she noticed large blood clots. Pt stated that is normal for her when on her menstrual cycle.

## 2021-10-27 NOTE — Progress Notes (Signed)
Lower extremity venous and TCD bubble study completed.   Please see CV Proc for preliminary results.   Darlin Coco, RDMS, RVT

## 2021-10-27 NOTE — Evaluation (Signed)
Occupational Therapy Evaluation Patient Details Name: Megan Chaney MRN: [id number] DOB: 06-Aug-1971 Today's Date: 10/27/2021   History of Present Illness Pt is a 51 y/o female admitted after being found down at work with aphasia and R sided weakness. Pt found to have Multifocal acute/subacute nonhemorrhagic infarcts involving the  left MCA territory. PMH includes chronic iron deficiency anemia secondary to fibroids and menorrhagia on hormone therapy.   Clinical Impression   PTA pt lives independently with her fiance and works several jobs. Currently graduated/in grad school.  Requires +2 mod A with stand step pivot transfer and mod A with ADL due to deficits listed below. Pt very motivated to become more independent. Speech has significantly improved since yesterday. Anticipate pt will make excellent progress and will benefit form intensive rehab at [facility] maximize independence and facilitate safe DC home with supportive fiance. Pt very appreciative. Acute OT to follow.      Recommendations for follow up therapy are one component of a multi-disciplinary discharge planning process, led by the attending physician.  Recommendations may be updated based on patient status, additional functional criteria and insurance authorization.   Follow Up Recommendations  Acute inpatient rehab (3hours/day)    Assistance Recommended at Discharge Frequent or constant Supervision/Assistance  Patient can return home with the following A lot of help with walking and/or transfers;A lot of help with bathing/dressing/bathroom;Direct supervision/assist for medications management;Direct supervision/assist for financial management;Assist for transportation;Help with stairs or ramp for entrance    Functional Status Assessment  Patient has had a recent decline in their functional status and demonstrates the ability to make significant improvements in function in a reasonable and predictable amount of time.  Equipment  Recommendations  BSC/3in1    Recommendations for Other Services Rehab consult     Precautions / Restrictions Precautions Precautions: Fall Restrictions Weight Bearing Restrictions: No      Mobility Bed Mobility Overal bed mobility: Needs Assistance Bed Mobility: Supine to Sit;Sit to Supine     Supine to sit: Min assist Sit to supine: Min assist   General bed mobility comments: Required assist for RLE and RUE management.    Transfers Overall transfer level: Needs assistance Equipment used: 2 person hand held assist Transfers: Sit to/from Stand;Bed to chair/wheelchair/BSC Sit to Stand: Min assist;+2 safety/equipment;+2 physical assistance     Step pivot transfers: Mod assist;+2 physical assistance;+2 safety/equipment     General transfer comment: Min A for steadying assist to stand. Mod A +2 to take steps to/from chair for clean up. Required manual blocking of RLE.      Balance Overall balance assessment: Needs assistance Sitting-balance support: No upper extremity supported;Feet supported Sitting balance-Leahy Scale: Fair     Standing balance support: Single extremity supported Standing balance-Leahy Scale: Poor Standing balance comment: reliant on external support                           ADL either performed or assessed with clinical judgement   ADL Overall ADL's : Needs assistance/impaired Eating/Feeding: Minimal assistance   Grooming: Moderate assistance   Upper Body Bathing: Moderate assistance   Lower Body Bathing: Maximal assistance   Upper Body Dressing : Moderate assistance   Lower Body Dressing: Moderate assistance   Toilet Transfer: Moderate assistance   Toileting- Clothing Manipulation and Hygiene: Maximal assistance; having menstrul cycle; heavy clotting  - nsg made aware       Functional mobility during ADLs: Moderate assistance;+2 for physical assistance  Vision Baseline Vision/History: 1 Wears  glasses Additional Comments: will further assess; appears to attend to all visual fields     Perception Perception Comments: good midline orientation; will further assess   Praxis Praxis Praxis-Other Comments: apparent sensory motor processing deficits; improves with repetition    Pertinent Vitals/Pain Pain Assessment: Faces Faces Pain Scale: No hurt     Hand Dominance Right   Extremity/Trunk Assessment Upper Extremity Assessment Upper Extremity Assessment: RUE deficits/detail RUE Deficits / Details: flexor synergy beginning to develop after facilitation; nonfunctional; reports abnormal sensation RUE Sensation: decreased light touch;decreased proprioception RUE Coordination: decreased fine motor;decreased gross motor   Lower Extremity Assessment Lower Extremity Assessment: Defer to PT evaluation RLE Deficits / Details: Able to perform heel slide and ankle pump in supine. Decreased sensation reported. Buckling noted during stance RLE Sensation: decreased light touch   Cervical / Trunk Assessment Cervical / Trunk Assessment: Normal   Communication Communication Communication: Expressive difficulties   Cognition Arousal/Alertness: Awake/alert Behavior During Therapy: WFL for tasks assessed/performed Overall Cognitive Status: Difficult to assess                                 General Comments: Able to follow commands this session, however, somewhat apraxic; cues to increase awareness of R UE     General Comments  Fiance present adn very supportive    Exercises Exercises: Other exercises (edema control R hand; keep elevated; encourage hand PROM)   Shoulder Instructions      Home Living Family/patient expects to be discharged to:: Private residence Living Arrangements: Spouse/significant other Available Help at Discharge: Family;Available 24 hours/day Type of Home: Apartment Home Access: Stairs to enter [employer] of Steps: 4 Entrance  Stairs-Rails: Right;Left Home Layout: One level     Bathroom Shower/Tub: Teacher, early years/pre: Standard Bathroom Accessibility: Yes How Accessible: Accessible via walker Home Equipment: None          Prior Functioning/Environment Prior Level of Function : Independent/Modified Independent;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[employer]; recently graduated/in grad school)                        OT Problem List: Decreased strength;Decreased range of motion;Decreased activity tolerance;Impaired balance (sitting and/or standing);Impaired vision/perception;Decreased coordination;Decreased cognition;Decreased safety awareness;Decreased knowledge of use of DME or AE;Cardiopulmonary status limiting activity;Impaired sensation;Impaired tone;Impaired UE functional use      OT Treatment/Interventions: Self-care/ADL training;Therapeutic exercise;Neuromuscular education;DME and/or AE instruction;Therapeutic activities;Cognitive remediation/compensation;Splinting;Visual/perceptual remediation/compensation;Patient/family education;Balance training    OT Goals(Current goals can be found in the care plan section) Acute Rehab OT Goals Patient Stated Goal: to get better OT Goal Formulation: With patient Time For Goal Achievement: 11/10/21 Potential to Achieve Goals: Good  OT Frequency: Min 2X/week    Co-evaluation PT/OT/SLP Co-Evaluation/Treatment: Yes Reason for Co-Treatment: Necessary to address cognition/behavior during functional activity;For patient/therapist safety;To address functional/ADL transfers   OT goals addressed during session: ADL's and self-care      AM-PAC OT "6 Clicks" Daily Activity     Outcome Measure Help from another person eating meals?: A Little Help from another person taking care of personal grooming?: A Lot Help from another person toileting, which includes using toliet, bedpan, or urinal?: A Lot Help from another person bathing (including washing,  rinsing, drying)?: A Lot Help from another person to put on and taking off regular upper body clothing?: A Lot Help from another person to put on and taking off regular  lower body clothing?: A Lot 6 Click Score: 13   End of Session Equipment Utilized During Treatment: Gait belt Nurse Communication: Mobility status  Activity Tolerance: Patient tolerated treatment well Patient left: in bed;with call bell/phone within reach;with family/visitor present  OT Visit Diagnosis: Unsteadiness on feet (R26.81);Other abnormalities of gait and mobility (R26.89);Muscle weakness (generalized) (M62.81);Apraxia (R48.2);Other symptoms and signs involving cognitive function;Other symptoms and signs involving the nervous system (R29.898);Cognitive communication deficit (R41.841);Hemiplegia and hemiparesis Symptoms and signs involving cognitive functions: Cerebral infarction Hemiplegia - Right/Left: Right Hemiplegia - dominant/non-dominant: Dominant Hemiplegia - caused by: Cerebral infarction                Time: 6629-4765 OT Time Calculation (min): 32 min Charges:  OT General Charges $OT Visit: 1 Visit OT Evaluation $OT Eval Moderate Complexity: [place], OT/L   Acute OT Clinical Specialist Acute Rehabilitation Services Pager [phone] Office [phone]   [facility] 10/27/2021, 2:33 PM

## 2021-10-27 NOTE — Progress Notes (Signed)
°  Inpatient Rehab Admissions Coordinator:   Per therapy recommendations, patient was screened for CIR candidacy by Clemens Catholic, MS, CCC-SLP.  Pt. Appears to be an appropriate candidate for an acute inpatient rehab; however, her insurance is out of network with [place]. TOC will need to locate an [facility] facility that is in her insurance's network.    Clemens Catholic, [place], [place] Admissions Coordinator  [phone] ([place]) [phone] (office)

## 2021-10-27 NOTE — Progress Notes (Signed)
PROGRESS NOTE    Megan Chaney  QQP:[id number] DOB: 09-06-1971 DOA: 10/26/2021 PCP: Michael Boston, MD    Brief Narrative:  : Megan Chaney is a 51 y.o. female with medical history significant of chronic iron deficiency anemia secondary to fibroids and menorrhagia on hormone therapy, cigar smoker, GERD, presented with sudden onset of right-sided weakness and aphagia  1/3Neurology was consulted.  Pt family at bedside reports she is little better with speech, but overall still with expressive aphagia. Tries to get her words out    Consultants:  Neurology  Procedures:   Antimicrobials:      Subjective: Tries also questions but is difficult.  Still not able to use the right upper limb  Objective: Vitals:   10/27/21 1100 10/27/21 1130 10/27/21 1230 10/27/21 1300  BP: (!) 172/99 (!) 162/94 (!) 135/117 140/82  Pulse: (!) 104 (!) 110 (!) 101 95  Resp: 14 14 (!) 28 (!) 26  Temp:      TempSrc:      SpO2: 100% 100% 100% 100%  Weight:        Intake/Output Summary (Last 24 hours) at 10/27/2021 1601 Last data filed at 10/27/2021 1113 Gross per 24 hour  Intake 605 ml  Output 550 ml  Net 55 ml   Filed Weights   10/26/21 0900  Weight: 73.4 kg    Examination:  General exam: Appears calm and comfortable  Respiratory system: Clear to auscultation. Respiratory effort normal. Cardiovascular system: S1 & S2 heard, RRR. No JVD, murmurs, rubs, gallops or clicks Gastrointestinal system: Abdomen is nondistended, soft and nontender.  Normal bowel sounds heard. Central nervous system:Rt UE flaccid.  Alert and awake, nonfluent speech, difficulty getting work out/finding diff.  No facial droopAlert and oriented.  Extremities: no edema Psychiatry: Mood & affect appropriate.     Data Reviewed: I have personally reviewed following labs and imaging studies  CBC: Recent Labs  Lab 10/26/21 0940 10/26/21 0948 10/27/21 0326  WBC 14.0*  --   --   NEUTROABS 11.2*  --   --   HGB 7.2*  8.2* 7.5*  HCT 24.2* 24.0* 24.5*  MCV 85.5  --   --   PLT 457*  --   --    Basic Metabolic Panel: Recent Labs  Lab 10/26/21 0940 10/26/21 0948  NA 138 138  K 4.0 3.9  CL 107 107  CO2 21*  --   GLUCOSE 118* 116*  BUN 15 15  CREATININE 0.64 0.60  CALCIUM 8.8*  --    GFR: CrCl cannot be calculated (Unknown ideal weight.). Liver Function Tests: Recent Labs  Lab 10/26/21 0940  AST 28  ALT 25  ALKPHOS 57  BILITOT 0.4  PROT 6.5  ALBUMIN 3.7   No results for input(s): LIPASE, AMYLASE in the last 168 hours. No results for input(s): AMMONIA in the last 168 hours. Coagulation Profile: Recent Labs  Lab 10/26/21 0940  INR 0.9   Cardiac Enzymes: No results for input(s): CKTOTAL, CKMB, CKMBINDEX, TROPONINI in the last 168 hours. BNP (last 3 results) No results for input(s): PROBNP in the last 8760 hours. HbA1C: Recent Labs    10/26/21 0940  HGBA1C 4.3*   CBG: Recent Labs  Lab 10/26/21 0938  GLUCAP 103*   Lipid Profile: Recent Labs    10/26/21 0940 10/27/21 0326  CHOL  --  145  HDL  --  52  LDLCALC  --  79  TRIG  --  69  CHOLHDL  --  2.8  LDLDIRECT 86.3  --    Thyroid Function Tests: Recent Labs    10/26/21 1658  TSH 4.901*   Anemia Panel: Recent Labs    10/26/21 1658  TIBC 430  IRON 11*   Sepsis Labs: No results for input(s): PROCALCITON, LATICACIDVEN in the last 168 hours.  No results found for this or any previous visit (from the past 240 hour(s)).       Radiology Studies: MR BRAIN WO CONTRAST  Result Date: 10/26/2021 CLINICAL DATA:  Neuro deficit, acute, stroke suspected. Right-sided weakness. EXAM: MRI HEAD WITHOUT CONTRAST TECHNIQUE: Multiplanar, multiecho pulse sequences of the brain and surrounding structures were obtained without intravenous contrast. COMPARISON:  CT head without contrast. CT perfusion. CTA head and neck 10/26/2021. FINDINGS: Brain: Multifocal areas restricted diffusion are present in the left MCA territory,  predominantly inferior and posteriorly. There is some involvement of the left middle temporal gyrus. Cortical involvement is present in the posterior left parietal lobe as well as the more superior left parietal lobe. Areas of cortical infarction are present in the anterior left frontal lobe involving the middle and inferior frontal cortex. No acute hemorrhage present. The study is moderately degraded by patient motion. Right hemisphere is within normal limits. The ventricles are of normal size. No significant extraaxial fluid collection is present. The internal auditory canals are within normal limits. The brainstem and cerebellum are within normal limits. Vascular: Flow is present in the major intracranial arteries. Skull and upper cervical spine: The craniocervical junction is normal. Upper cervical spine is within normal limits. Marrow signal is unremarkable. Sinuses/Orbits: The paranasal sinuses and mastoid air cells are clear. The globes and orbits are within normal limits. IMPRESSION: 1. Multifocal acute/subacute nonhemorrhagic infarcts involving the left MCA territory. Findings are predominantly inferior and posteriorly. There is cortical infarction also involves the more anterior middle and inferior frontal lobes. 2. No acute hemorrhage. These results were called by telephone at the time of interpretation on 10/26/2021 at 2:52 pm to provider [facility] , who verbally acknowledged these results. Electronically Signed   By: San Morelle M.D.   On: 10/26/2021 14:54   CT C-SPINE NO CHARGE  Result Date: 10/26/2021 CLINICAL DATA:  Trauma, found down EXAM: CT CERVICAL SPINE WITHOUT CONTRAST TECHNIQUE: Multidetector CT imaging of the cervical spine was performed without intravenous contrast. Multiplanar CT image reconstructions were also generated. COMPARISON:  None. FINDINGS: Alignment: Alignment of posterior margins of vertebral bodies is unremarkable. Degenerative changes are noted with bony spurs at  C4-C5 and C5-C6 levels. Skull base and vertebrae: No recent fracture is seen. Soft tissues and spinal canal: There is no significant spinal stenosis. Prevertebral soft tissues are unremarkable. Disc levels: There is mild encroachment of neural foramina at C4-C5 and C5-C6 levels anterior bony spurs seen in the upper thoracic spine. Upper chest: Unremarkable. Other: Thyroid is enlarged in size. IMPRESSION: No recent fracture is seen in the cervical spine. Alignment of posterior margins of vertebral bodies is unremarkable. Cervical spondylosis with mild encroachment of neural foramina at C4-C5 and C5-C6 levels. Electronically Signed   By: Elmer Picker M.D.   On: 10/26/2021 11:19   EEG adult  Result Date: 10/27/2021 Lora Havens, MD     10/27/2021  7:42 AM Patient Name: Milinda Sweeney MRN: [id number] Epilepsy Attending: Lora Havens Referring Physician/Provider: Dr Su Monks Date 10/26/2021 Duration: 21.09 mins Patient history: 51 year old woman with a past medical his history significant for fibroids who presented as a pretty activated stroke code with EMS for  aphasia and right-sided weakness c/f acute ischemic stroke. EEG to evaluate for seizure Level of alertness: Awake AEDs during EEG study: None Technical aspects: This EEG study was done with scalp electrodes positioned according to the 10-20 International system of electrode placement. Electrical activity was acquired at a sampling rate of 500Hz  and reviewed with a high frequency filter of 70Hz  and a low frequency filter of 1Hz . EEG data were recorded continuously and digitally stored. Description: The posterior dominant rhythm consists of 10 Hz activity of moderate voltage (25-35 uV) seen predominantly in posterior head regions, asymmetric (left<right) and reactive to eye opening and eye closing.  Hyperventilation and photic stimulation were not performed.   ABNORMALITY - Background asymmetry, left<right IMPRESSION: This study is suggestive of  cortical dysfunction arising from left occipital region likely secondary to underlying structural abnormality/stroke. No seizures or epileptiform discharges were seen throughout the recording. Priyanka [name]   Overnight EEG with video  Result Date: 10/27/2021 Lora Havens, MD     10/27/2021  9:47 AM Patient Name: Shareena Nusz MRN: [id number] Epilepsy Attending: Lora Havens Referring Physician/Provider: Dr Su Monks Duration: 10/26/2021 3474 to 10/27/2021 0848  Patient history: 51 year old woman with a past medical his history significant for fibroids who presented as a pretty activated stroke code with EMS for aphasia and right-sided weakness c/f acute ischemic stroke. EEG to evaluate for seizure  Level of alertness: Awake, asleep  AEDs during EEG study: None  Technical aspects: This EEG study was done with scalp electrodes positioned according to the 10-20 International system of electrode placement. Electrical activity was acquired at a sampling rate of 500Hz  and reviewed with a high frequency filter of 70Hz  and a low frequency filter of 1Hz . EEG data were recorded continuously and digitally stored.  Description: The posterior dominant rhythm consists of 10 Hz activity of moderate voltage (25-35 uV) seen predominantly in posterior head regions, asymmetric (left<right) and reactive to eye opening and eye closing.  Hyperventilation and photic stimulation were not performed.    ABNORMALITY - Background asymmetry, left<right  IMPRESSION: This study is suggestive of cortical dysfunction arising from left occipital region likely secondary to underlying structural abnormality/stroke. No seizures or epileptiform discharges were seen throughout the recording.  Priyanka O Yadav   VAS [place] TRANSCRANIAL DOPPLER W BUBBLES  Result Date: 10/27/2021  Transcranial Doppler with Bubble Patient Name:  VENIE MONTESINOS  Date of Exam:   10/27/2021 Medical Rec #: [id number]        Accession #:    [id number] Date of Birth:  May 11, 1971        Patient Gender: F Patient Age:   25 years Exam Location:  [facility] Procedure:      VAS [place] TRANSCRANIAL DOPPLER W BUBBLES Referring Phys: Elwin Sleight DE LA TORRE --------------------------------------------------------------------------------  Indications: Stroke. Comparison Study: No prior studies. Performing Technologist: Darlin Coco RDMS, RVT  Examination Guidelines: A complete evaluation includes B-mode imaging, spectral Doppler, color Doppler, and power Doppler as needed of all accessible portions of each vessel. Bilateral testing is considered an integral part of a complete examination. Limited examinations for reoccurring indications may be performed as noted.  Summary: No HITS at rest or during Valsalva. Negative transcranial Doppler Bubble study with no evidence of right to left intracardiac communication.  *See table(s) above for TCD measurements and observations.    Preliminary    ECHOCARDIOGRAM COMPLETE BUBBLE STUDY  Result Date: 10/27/2021    ECHOCARDIOGRAM REPORT   Patient Name:   Izell Cooper Date of  Exam: 10/27/2021 Medical Rec #:  [id number]       Height:       63.0 in Accession #:    [id number]      Weight:       161.8 lb Date of Birth:  06-11-1971       BSA:          1.767 m Patient Age:    31 years        BP:           132/98 mmHg Patient Gender: F               HR:           97 bpm. Exam Location:  Inpatient Procedure: 2D Echo, Cardiac Doppler, Color Doppler and Intracardiac            Opacification Agent Indications:    Stroke, bubble  History:        Patient has no prior history of Echocardiogram examinations.  Sonographer:    Glo Herring Referring Phys: [place]  1. Left ventricular ejection fraction, by estimation, is 60 to 65%. The left ventricle has normal function. The left ventricle has no regional wall motion abnormalities. Left ventricular diastolic parameters were normal.  2. Right ventricular systolic function is normal. The  right ventricular size is normal.  3. The mitral valve is normal in structure. No evidence of mitral valve regurgitation. No evidence of mitral stenosis.  4. The aortic valve is tricuspid. Aortic valve regurgitation is not visualized. No aortic stenosis is present.  5. The inferior vena cava is normal in size with greater than 50% respiratory variability, suggesting right atrial pressure of 3 mmHg.  6. Agitated saline contrast bubble study was negative, with no evidence of any interatrial shunt. FINDINGS  Left Ventricle: Left ventricular ejection fraction, by estimation, is 60 to 65%. The left ventricle has normal function. The left ventricle has no regional wall motion abnormalities. The left ventricular internal cavity size was normal in size. There is  no left ventricular hypertrophy. Left ventricular diastolic parameters were normal. Right Ventricle: The right ventricular size is normal. No increase in right ventricular wall thickness. Right ventricular systolic function is normal. Left Atrium: Left atrial size was normal in size. Right Atrium: Right atrial size was normal in size. Pericardium: There is no evidence of pericardial effusion. Mitral Valve: The mitral valve is normal in structure. No evidence of mitral valve regurgitation. No evidence of mitral valve stenosis. Tricuspid Valve: The tricuspid valve is normal in structure. Tricuspid valve regurgitation is trivial. No evidence of tricuspid stenosis. Aortic Valve: The aortic valve is tricuspid. Aortic valve regurgitation is not visualized. No aortic stenosis is present. Aortic valve mean gradient measures 5.0 mmHg. Aortic valve peak gradient measures 8.2 mmHg. Aortic valve area, by VTI measures 1.74 cm. Pulmonic Valve: The pulmonic valve was not well visualized. Pulmonic valve regurgitation is trivial. No evidence of pulmonic stenosis. Aorta: The aortic root is normal in size and structure. Venous: The inferior vena cava is normal in size with greater  than 50% respiratory variability, suggesting right atrial pressure of 3 mmHg. IAS/Shunts: No atrial level shunt detected by color flow Doppler. Agitated saline contrast was given intravenously to evaluate for intracardiac shunting. Agitated saline contrast bubble study was negative, with no evidence of any interatrial shunt.  LEFT VENTRICLE PLAX 2D LVOT diam:     2.00 cm LV SV:         46  LV SV Index:   26 LVOT Area:     3.14 cm  IVC IVC diam: 1.30 cm LEFT ATRIUM           Index LA Vol (A2C): 31.9 ml 18.05 ml/m LA Vol (A4C): 48.3 ml 27.33 ml/m  AORTIC VALVE                     PULMONIC VALVE AV Area (Vmax):    1.83 cm      PV Vmax:       0.99 m/s AV Area (Vmean):   1.60 cm      PV Peak grad:  3.9 mmHg AV Area (VTI):     1.74 cm AV Vmax:           143.00 cm/s AV Vmean:          102.000 cm/s AV VTI:            0.265 m AV Peak Grad:      8.2 mmHg AV Mean Grad:      5.0 mmHg LVOT Vmax:         83.30 cm/s LVOT Vmean:        51.900 cm/s LVOT VTI:          0.147 m LVOT/AV VTI ratio: 0.55  AORTA Ao Root diam: 2.90 cm Ao Asc diam:  2.90 cm  SHUNTS Systemic VTI:  0.15 m Systemic Diam: 2.00 cm Kardie Tobb DO Electronically signed by Berniece Salines DO Signature Date/Time: 10/27/2021/3:32:16 PM    Final    CT HEAD CODE STROKE WO CONTRAST  Result Date: 10/26/2021 CLINICAL DATA:  Code stroke.  Right-sided weakness EXAM: CT HEAD WITHOUT CONTRAST TECHNIQUE: Contiguous axial images were obtained from the base of the skull through the vertex without intravenous contrast. COMPARISON:  None. FINDINGS: Brain: No evidence of acute infarction, hemorrhage, hydrocephalus, extra-axial collection or mass lesion/mass effect. Vascular: No hyperdense vessel or unexpected calcification. Skull: Normal. Negative for fracture or focal lesion. Sinuses/Orbits: No acute finding. Other: These results were communicated to [facility] at 10:00 am on 10/26/2021 by text page via the [facility] messaging system. ASPECTS [facility] Stroke Program Early CT Score) -  Ganglionic level infarction (caudate, lentiform nuclei, internal capsule, insula, M1-M3 cortex): 7 - Supraganglionic infarction (M4-M6 cortex): 3 Total score (0-10 with 10 being normal): 10 IMPRESSION: No acute finding.  ASPECTS is 10. Electronically Signed   By: Jorje Guild M.D.   On: 10/26/2021 10:01   VAS [place] LOWER EXTREMITY VENOUS (DVT)  Result Date: 10/27/2021  Lower Venous DVT Study Patient Name:  YULA CROTWELL  Date of Exam:   10/27/2021 Medical Rec #: [id number]        Accession #:    [id number] Date of Birth: 1971/08/07        Patient Gender: F Patient Age:   80 years Exam Location:  [facility] Procedure:      VAS [place] LOWER EXTREMITY VENOUS (DVT) Referring Phys: Elwin Sleight DE LA TORRE --------------------------------------------------------------------------------  Indications: Stroke.  Comparison Study: No prior studies. Performing Technologist: Darlin Coco RDMS, RVT  Examination Guidelines: A complete evaluation includes B-mode imaging, spectral Doppler, color Doppler, and power Doppler as needed of all accessible portions of each vessel. Bilateral testing is considered an integral part of a complete examination. Limited examinations for reoccurring indications may be performed as noted. The reflux portion of the exam is performed with the patient in reverse Trendelenburg.  +---------+---------------+---------+-----------+----------+--------------+  RIGHT     Compressibility Phasicity Spontaneity  Properties Thrombus Aging  +---------+---------------+---------+-----------+----------+--------------+  CFV       Full            Yes       Yes                                    +---------+---------------+---------+-----------+----------+--------------+  SFJ       Full                                                             +---------+---------------+---------+-----------+----------+--------------+  FV Prox   Full                                                              +---------+---------------+---------+-----------+----------+--------------+  FV Mid    Full                                                             +---------+---------------+---------+-----------+----------+--------------+  FV Distal Full                                                             +---------+---------------+---------+-----------+----------+--------------+  PFV       Full                                                             +---------+---------------+---------+-----------+----------+--------------+  POP       Full            Yes       Yes                                    +---------+---------------+---------+-----------+----------+--------------+  PTV       Full                                                             +---------+---------------+---------+-----------+----------+--------------+  PERO      Full                                                             +---------+---------------+---------+-----------+----------+--------------+   +---------+---------------+---------+-----------+----------+--------------+  LEFT      Compressibility Phasicity Spontaneity Properties Thrombus Aging  +---------+---------------+---------+-----------+----------+--------------+  CFV       Full            Yes       Yes                                    +---------+---------------+---------+-----------+----------+--------------+  SFJ       Full                                                             +---------+---------------+---------+-----------+----------+--------------+  FV Prox   Full                                                             +---------+---------------+---------+-----------+----------+--------------+  FV Mid    Full                                                             +---------+---------------+---------+-----------+----------+--------------+  FV Distal Full                                                              +---------+---------------+---------+-----------+----------+--------------+  PFV       Full                                                             +---------+---------------+---------+-----------+----------+--------------+  POP       Full            Yes       Yes                                    +---------+---------------+---------+-----------+----------+--------------+  PTV       Full                                                             +---------+---------------+---------+-----------+----------+--------------+  PERO      Full                                                             +---------+---------------+---------+-----------+----------+--------------+  Summary: RIGHT: - There is no evidence of deep vein thrombosis in the lower extremity.  - No cystic structure found in the popliteal fossa.  LEFT: - There is no evidence of deep vein thrombosis in the lower extremity.  - No cystic structure found in the popliteal fossa.  *See table(s) above for measurements and observations.    Preliminary    CT ANGIO HEAD NECK W WO CM W PERF (CODE STROKE)  Result Date: 10/26/2021 CLINICAL DATA:  Right-sided weakness EXAM: CT ANGIOGRAPHY HEAD AND NECK CT PERFUSION BRAIN TECHNIQUE: Multidetector CT imaging of the head and neck was performed using the standard protocol during bolus administration of intravenous contrast. Multiplanar CT image reconstructions and MIPs were obtained to evaluate the vascular anatomy. Carotid stenosis measurements (when applicable) are obtained utilizing NASCET criteria, using the distal internal carotid diameter as the denominator. Multiphase CT imaging of the brain was performed following IV bolus contrast injection. Subsequent parametric perfusion maps were calculated using RAPID software. CONTRAST:  Dose is not known on this in progress study COMPARISON:  None. FINDINGS: CTA NECK FINDINGS Aortic arch: Normal Right carotid system: Vessels are smooth and widely patent Left  carotid system: Vessels are smooth and widely patent Vertebral arteries: No proximal subclavian stenosis. Skeleton: Negative Other neck: No acute finding Upper chest: No acute finding Review of the MIP images confirms the above findings CTA HEAD FINDINGS Anterior circulation: No emergent large vessel occlusion. No proximal flow limiting stenosis, beading, or aneurysm Posterior circulation:  No emergent finding Venous sinuses: Unremarkable Review of the MIP images confirms the above findings CT Brain Perfusion Findings: ASPECTS: 10 CBF (<30%) Volume: 43mL Perfusion (Tmax>6.0s) volume: 54mL. With a 4 second threshold there is and even greater area of diminished perfusion in the left cerebral hemisphere, question shower of emboli or autolysis. These results were called by telephone at the time of interpretation on 10/26/2021 at 10:15 am to provider [facility] , who verbally acknowledged these results. IMPRESSION: 1. No emergent large vessel occlusion. 2. CT perfusion shows ischemic range blood flow in the left frontal lobe, suspect peripheral emboli. 3. No embolic source or significant atheromatous disease seen in the neck. Electronically Signed   By: Jorje Guild M.D.   On: 10/26/2021 10:18        Scheduled Meds:   stroke: mapping our early stages of recovery book   Does not apply Once   aspirin  81 mg Oral Daily   atorvastatin  40 mg Oral Daily   calcium-vitamin D  1 tablet Oral BID   clopidogrel  75 mg Oral Daily   enoxaparin (LOVENOX) injection  40 mg Subcutaneous Q24H   ferrous sulfate  325 mg Oral TID WC   megestrol  40 mg Oral BID   nicotine  21 mg Transdermal Daily   [START ON 10/28/2021] Vitamin D (Ergocalciferol)  50,000 Units Oral Q Wed   Continuous Infusions:  Assessment & Plan:   Principal Problem:   Stroke (cerebrum) ([place])   Stroke: Left middle cerebral artery branch infarct likely secondary due to embolism of cryptogenic source Please see CT/MRI result EF nml EF. No shunt,  neg. Bubble LE neg dvt Goal LDL <70 A1c 4.3 On dysphagia 1 diet On hormone therapy. Asa 81 and plavix 75mg  x3 month followed by asa alone PT/OT for CIR evaluation Continue neurochecks Stat CT if neuro exam changes Permissive hypertension x24 hrs EEG negative Urine drug +marij.    Anemia Possibly from fibroid?, Menorrhagia Ck iron studies  Marijuana use Recommend cessation    DVT prophylaxis: lovenox Code Status:full Family Communication: husband at bedside Disposition Plan:  Status is: Inpatient  Remains inpatient appropriate because: IV treatment, w/u pending            LOS: 1 day   Time spent: 35 minutes with more than 50% on [place], MD [facility] Pager [phone]  If 7PM-7AM, please contact night-coverage 10/27/2021, 4:01 PM

## 2021-10-28 DIAGNOSIS — I639 Cerebral infarction, unspecified: Secondary | ICD-10-CM | POA: Diagnosis not present

## 2021-10-28 LAB — CBC
HCT: 23.4 % — ABNORMAL LOW (ref 36.0–46.0)
Hemoglobin: 7.5 g/dL — ABNORMAL LOW (ref 12.0–15.0)
MCH: 26.3 pg (ref 26.0–34.0)
MCHC: 32.1 g/dL (ref 30.0–36.0)
MCV: 82.1 fL (ref 80.0–100.0)
Platelets: 389 10*3/uL (ref 150–400)
RBC: 2.85 MIL/uL — ABNORMAL LOW (ref 3.87–5.11)
RDW: 16.2 % — ABNORMAL HIGH (ref 11.5–15.5)
WBC: 10.9 10*3/uL — ABNORMAL HIGH (ref 4.0–10.5)
nRBC: 0.3 % — ABNORMAL HIGH (ref 0.0–0.2)

## 2021-10-28 LAB — ANA W/REFLEX IF POSITIVE: Anti Nuclear Antibody (ANA): NEGATIVE

## 2021-10-28 LAB — HEMOGLOBIN A1C
Hgb A1c MFr Bld: <4.2 % — ABNORMAL LOW (ref 4.8–5.6)
Mean Plasma Glucose: <74 mg/dL

## 2021-10-28 NOTE — Plan of Care (Signed)
Pt is improving. She is able to move her right arm from elbow to hand. She still continues to have aphasia and dysarthria.  Problem: Education: Goal: Knowledge of General Education information will improve Description: Including pain rating scale, medication(s)/side effects and non-pharmacologic comfort measures Outcome: Progressing   Problem: Health Behavior/Discharge Planning: Goal: Ability to manage health-related needs will improve Outcome: Progressing   Problem: Clinical Measurements: Goal: Ability to maintain clinical measurements within normal limits will improve Outcome: Progressing Goal: Will remain free from infection Outcome: Progressing Goal: Diagnostic test results will improve Outcome: Progressing Goal: Respiratory complications will improve Outcome: Progressing Goal: Cardiovascular complication will be avoided Outcome: Progressing   Problem: Activity: Goal: Risk for activity intolerance will decrease Outcome: Progressing   Problem: Nutrition: Goal: Adequate nutrition will be maintained Outcome: Progressing   Problem: Coping: Goal: Level of anxiety will decrease Outcome: Progressing   Problem: Elimination: Goal: Will not experience complications related to bowel motility Outcome: Progressing Goal: Will not experience complications related to urinary retention Outcome: Progressing   Problem: Pain Managment: Goal: General experience of comfort will improve Outcome: Progressing   Problem: Safety: Goal: Ability to remain free from injury will improve Outcome: Progressing   Problem: Skin Integrity: Goal: Risk for impaired skin integrity will decrease Outcome: Progressing

## 2021-10-28 NOTE — Progress Notes (Signed)
STROKE TEAM PROGRESS NOTE   INTERVAL HISTORY Patient is seen in her room with her occupational therapist at. the bedside.  She continues to have expressive aphasia and right hemiparesis but is motivated and working with a therapist.  Neurological exam is unchanged.  Vital signs are stable. Vitals:   10/27/21 2333 10/28/21 0338 10/28/21 0823 10/28/21 1131  BP: 107/69 121/79 128/71 (!) 136/93  Pulse: 99 (!) 102 98 (!) 104  Resp: 17 16 18 16   Temp: 97.9 F (36.6 C) 98.2 F (36.8 C) 99.1 F (37.3 C) 99.6 F (37.6 C)  TempSrc: Oral Oral Oral Oral  SpO2: 100% 100% 100% 100%  Weight:      Height:       CBC:  Recent Labs  Lab 10/26/21 0940 10/26/21 0948 10/27/21 0326 10/28/21 0300  WBC 14.0*  --   --  10.9*  NEUTROABS 11.2*  --   --   --   HGB 7.2*   < > 7.5* 7.5*  HCT 24.2*   < > 24.5* 23.4*  MCV 85.5  --   --  82.1  PLT 457*  --   --  389   < > = values in this interval not displayed.   Basic Metabolic Panel:  Recent Labs  Lab 10/26/21 0940 10/26/21 0948  NA 138 138  K 4.0 3.9  CL 107 107  CO2 21*  --   GLUCOSE 118* 116*  BUN 15 15  CREATININE 0.64 0.60  CALCIUM 8.8*  --    Lipid Panel:  Recent Labs  Lab 10/27/21 0326  CHOL 145  TRIG 69  HDL 52  CHOLHDL 2.8  VLDL 14  LDLCALC 79   HgbA1c:  Recent Labs  Lab 10/27/21 0326  HGBA1C <4.2*   Urine Drug Screen:  Recent Labs  Lab 10/26/21 1239  LABOPIA NONE DETECTED  COCAINSCRNUR NONE DETECTED  LABBENZ NONE DETECTED  AMPHETMU NONE DETECTED  THCU POSITIVE*  LABBARB NONE DETECTED    Alcohol Level No results for input(s): ETH in the last 168 hours.  IMAGING past 24 hours VAS [place] TRANSCRANIAL DOPPLER W BUBBLES  Result Date: 10/28/2021  Transcranial Doppler with Bubble Patient Name:  NOAH PELAEZ  Date of Exam:   10/27/2021 Medical Rec #: [id number]        Accession #:    [id number] Date of Birth: [birth year]        Patient Gender: F Patient Age:   70 years Exam Location:  [facility] Procedure:       VAS [place] TRANSCRANIAL DOPPLER W BUBBLES Referring Phys: Elwin Sleight DE LA TORRE --------------------------------------------------------------------------------  Indications: Stroke. Comparison Study: No prior studies. Performing Technologist: Darlin Coco RDMS, RVT  Examination Guidelines: A complete evaluation includes B-mode imaging, spectral Doppler, color Doppler, and power Doppler as needed of all accessible portions of each vessel. Bilateral testing is considered an integral part of a complete examination. Limited examinations for reoccurring indications may be performed as noted.  Summary: No HITS at rest or during Valsalva. Negative transcranial Doppler Bubble study with no evidence of right to left intracardiac communication.  Negative TCD Bubble study *See table(s) above for TCD measurements and observations.  Diagnosing physician: Antony Contras MD Electronically signed by Antony Contras MD on 10/28/2021 at 1:18:58 PM.    Final    ECHOCARDIOGRAM COMPLETE BUBBLE STUDY  Result Date: 10/27/2021    ECHOCARDIOGRAM REPORT   Patient Name:   YARITZY HUSER Date of Exam: 10/27/2021 Medical Rec #:  [id number]  Height:       63.0 in Accession #:    [id number]      Weight:       161.8 lb Date of Birth:  May 15, 1971       BSA:          1.767 m Patient Age:    38 years        BP:           132/98 mmHg Patient Gender: F               HR:           97 bpm. Exam Location:  Inpatient Procedure: 2D Echo, Cardiac Doppler, Color Doppler and Intracardiac            Opacification Agent Indications:    Stroke, bubble  History:        Patient has no prior history of Echocardiogram examinations.  Sonographer:    Glo Herring Referring Phys: [place]  1. Left ventricular ejection fraction, by estimation, is 60 to 65%. The left ventricle has normal function. The left ventricle has no regional wall motion abnormalities. Left ventricular diastolic parameters were normal.  2. Right ventricular systolic function  is normal. The right ventricular size is normal.  3. The mitral valve is normal in structure. No evidence of mitral valve regurgitation. No evidence of mitral stenosis.  4. The aortic valve is tricuspid. Aortic valve regurgitation is not visualized. No aortic stenosis is present.  5. The inferior vena cava is normal in size with greater than 50% respiratory variability, suggesting right atrial pressure of 3 mmHg.  6. Agitated saline contrast bubble study was negative, with no evidence of any interatrial shunt. FINDINGS  Left Ventricle: Left ventricular ejection fraction, by estimation, is 60 to 65%. The left ventricle has normal function. The left ventricle has no regional wall motion abnormalities. The left ventricular internal cavity size was normal in size. There is  no left ventricular hypertrophy. Left ventricular diastolic parameters were normal. Right Ventricle: The right ventricular size is normal. No increase in right ventricular wall thickness. Right ventricular systolic function is normal. Left Atrium: Left atrial size was normal in size. Right Atrium: Right atrial size was normal in size. Pericardium: There is no evidence of pericardial effusion. Mitral Valve: The mitral valve is normal in structure. No evidence of mitral valve regurgitation. No evidence of mitral valve stenosis. Tricuspid Valve: The tricuspid valve is normal in structure. Tricuspid valve regurgitation is trivial. No evidence of tricuspid stenosis. Aortic Valve: The aortic valve is tricuspid. Aortic valve regurgitation is not visualized. No aortic stenosis is present. Aortic valve mean gradient measures 5.0 mmHg. Aortic valve peak gradient measures 8.2 mmHg. Aortic valve area, by VTI measures 1.74 cm. Pulmonic Valve: The pulmonic valve was not well visualized. Pulmonic valve regurgitation is trivial. No evidence of pulmonic stenosis. Aorta: The aortic root is normal in size and structure. Venous: The inferior vena cava is normal in size  with greater than 50% respiratory variability, suggesting right atrial pressure of 3 mmHg. IAS/Shunts: No atrial level shunt detected by color flow Doppler. Agitated saline contrast was given intravenously to evaluate for intracardiac shunting. Agitated saline contrast bubble study was negative, with no evidence of any interatrial shunt.  LEFT VENTRICLE PLAX 2D LVOT diam:     2.00 cm LV SV:         46 LV SV Index:   26 LVOT Area:     3.14  cm  IVC IVC diam: 1.30 cm LEFT ATRIUM           Index LA Vol (A2C): 31.9 ml 18.05 ml/m LA Vol (A4C): 48.3 ml 27.33 ml/m  AORTIC VALVE                     PULMONIC VALVE AV Area (Vmax):    1.83 cm      PV Vmax:       0.99 m/s AV Area (Vmean):   1.60 cm      PV Peak grad:  3.9 mmHg AV Area (VTI):     1.74 cm AV Vmax:           143.00 cm/s AV Vmean:          102.000 cm/s AV VTI:            0.265 m AV Peak Grad:      8.2 mmHg AV Mean Grad:      5.0 mmHg LVOT Vmax:         83.30 cm/s LVOT Vmean:        51.900 cm/s LVOT VTI:          0.147 m LVOT/AV VTI ratio: 0.55  AORTA Ao Root diam: 2.90 cm Ao Asc diam:  2.90 cm  SHUNTS Systemic VTI:  0.15 m Systemic Diam: 2.00 cm Kardie Tobb DO Electronically signed by Berniece Salines DO Signature Date/Time: 10/27/2021/3:32:16 PM    Final    VAS [place] LOWER EXTREMITY VENOUS (DVT)  Result Date: 10/27/2021  Lower Venous DVT Study Patient Name:  ARIAH MOWER  Date of Exam:   10/27/2021 Medical Rec #: [id number]        Accession #:    [id number] Date of Birth: April 26, 1971        Patient Gender: F Patient Age:   70 years Exam Location:  [facility] Procedure:      VAS [place] LOWER EXTREMITY VENOUS (DVT) Referring Phys: Elwin Sleight DE LA TORRE --------------------------------------------------------------------------------  Indications: Stroke.  Comparison Study: No prior studies. Performing Technologist: Darlin Coco RDMS, RVT  Examination Guidelines: A complete evaluation includes B-mode imaging, spectral Doppler, color Doppler, and power Doppler as needed  of all accessible portions of each vessel. Bilateral testing is considered an integral part of a complete examination. Limited examinations for reoccurring indications may be performed as noted. The reflux portion of the exam is performed with the patient in reverse Trendelenburg.  +---------+---------------+---------+-----------+----------+--------------+  RIGHT     Compressibility Phasicity Spontaneity Properties Thrombus Aging  +---------+---------------+---------+-----------+----------+--------------+  CFV       Full            Yes       Yes                                    +---------+---------------+---------+-----------+----------+--------------+  SFJ       Full                                                             +---------+---------------+---------+-----------+----------+--------------+  FV Prox   Full                                                             +---------+---------------+---------+-----------+----------+--------------+  FV Mid    Full                                                             +---------+---------------+---------+-----------+----------+--------------+  FV Distal Full                                                             +---------+---------------+---------+-----------+----------+--------------+  PFV       Full                                                             +---------+---------------+---------+-----------+----------+--------------+  POP       Full            Yes       Yes                                    +---------+---------------+---------+-----------+----------+--------------+  PTV       Full                                                             +---------+---------------+---------+-----------+----------+--------------+  PERO      Full                                                             +---------+---------------+---------+-----------+----------+--------------+    +---------+---------------+---------+-----------+----------+--------------+  LEFT      Compressibility Phasicity Spontaneity Properties Thrombus Aging  +---------+---------------+---------+-----------+----------+--------------+  CFV       Full            Yes       Yes                                    +---------+---------------+---------+-----------+----------+--------------+  SFJ       Full                                                             +---------+---------------+---------+-----------+----------+--------------+  FV Prox   Full                                                             +---------+---------------+---------+-----------+----------+--------------+  FV Mid    Full                                                             +---------+---------------+---------+-----------+----------+--------------+  FV Distal Full                                                             +---------+---------------+---------+-----------+----------+--------------+  PFV       Full                                                             +---------+---------------+---------+-----------+----------+--------------+  POP       Full            Yes       Yes                                    +---------+---------------+---------+-----------+----------+--------------+  PTV       Full                                                             +---------+---------------+---------+-----------+----------+--------------+  PERO      Full                                                             +---------+---------------+---------+-----------+----------+--------------+     Summary: RIGHT: - There is no evidence of deep vein thrombosis in the lower extremity.  - No cystic structure found in the popliteal fossa.  LEFT: - There is no evidence of deep vein thrombosis in the lower extremity.  - No cystic structure found in the popliteal fossa.  *See table(s) above for measurements and observations. Electronically signed  by Deitra Mayo MD on 10/27/2021 at 5:57:09 PM.    Final     PHYSICAL EXAM General:  Alert, obese middle-aged African-American female in no acute distress    NEURO:  Mental Status: AA&Ox3  Speech/Language: speech is with expressive aphasia with nonfluent speech and difficulty with word finding difficulty..  Patient is  able to name simple objects and has mildly impaired repetition.  Good comprehension  Cranial Nerves:  II: PERRL. Right partial peripheral visual field cut III, IV, VI: EOMI. Eyelids elevate symmetrically.  V: Sensation is intact to light touch and symmetrical to face.  VII: Smile is symmetrical. Able to puff cheeks and raise eyebrows.  VIII: hearing intact to voice. IX, X: Phonation is normal.  XII: right sided tongue  deviation Motor: 5/5 strength to LUE and LLE, 0/5 to RUE, 2/5 hand RLE 4/5 strength. Sensation- Intact to light touch bilaterally. Extinction absent to light touch to DSS.   Coordination: FTN intact to left Gait- deferred   ASSESSMENT/PLAN Ms. Devona Holmes is a 51 y.o. female with no significant past medical history presenting with aphasia and right sided weakness.  She was found to have multiple acute infarcts in the left MCA territory on MRI.  Stroke: Left middle cerebral artery branch infarct likely secondary due to embolism of cryptogenic source Code Stroke CT head No acute abnormality.  ASPECTS 10.    CTA head & neck No emergent LVO, ischemic range blood flow in frontal lobe, no significant atheromatous disease in the neck MRI  multifocal acute/subacute infarcts in left MCA territory 2D Echo EF 60-65%, no interatrial shunt TCD bubble study negative for right-to-left shunt Lower extremity doppler no evidence of DVT LDL 79 HgbA1c 4.3 VTE prophylaxis - lovenox    Diet   DIET DYS 2 Room service appropriate? Yes with Assist; Fluid consistency: Thin   No antithrombotic prior to admission, now on aspirin 81 mg and Plavix 75 mg daily.  For 3  months followed by aspirin alone Therapy recommendations:  CIR Disposition:  pending  Hypertension Home meds:  none Stable Permissive hypertension (OK if < 220/120) but gradually normalize in 5-7 days Long-term BP goal normotensive  Hyperlipidemia Home meds:  none LDL 79, goal < 70 Add atorvastatin 40 mg daily  Continue statin at discharge   Other Stroke Risk Factors Family hx stroke (mother, in her 72s)   Other Active Problems none  Hospital day # 2   Patient presented with expressive aphasia due to left MCA branch infarct likely of cryptogenic etiology.  She unfortunately presented outside candidate for thrombolysis and was not a candidate for mechanical thrombectomy.  Recommend TEE for cardiac source of embolism and loop recorder for paroxysmal A. fib and follow hypercoagulable panel and vasculitic labs.  Aspirin and Plavix for 3 weeks followed by aspirin along with aggressive risk factor modification.  Continue ongoing physical, occupational and speech therapy .Marland Kitchen  Patient counseled to quit marijuana and cigarettes and voiced agreement.  Transfer to inpatient rehab when bed available in the next few days.  Long discussion with patient and fianc at the bedside and answered questions.  Greater than 50% time during this prolonged 35-minute visit was spent on counseling and coordination of care and discussion about stroke prevention and evaluation and treatment and answering questions.  Discussed with Dr. Susann Givens, MD Medical Director [place] Pager: [phone] 10/28/2021 1:43 PM   To contact Stroke Continuity provider, please refer to [email or web address] After hours, contact General Neurology

## 2021-10-28 NOTE — Progress Notes (Signed)
Physical Therapy Treatment Patient Details Name: Megan Chaney MRN: [id number] DOB: 03-Mar-1971 Today's Date: 10/28/2021   History of Present Illness 51 y/o female admitted 1/2 after being found down at work with aphasia and R sided weakness. Pt found to have Multifocal acute/subacute nonhemorrhagic infarcts involving the L MCA territory. PMHx: chronic iron deficiency anemia secondary to fibroids and menorrhagia.    PT Comments    Pt was seen for progression to chair from bed with work on balance in both sitting and standing.  Her tolerance for standing is better today and used stedy to support and increase control of esp RLE.  Pt is motivated to get better due to her busy schedule with school and work, and is expecting her significant other today at [place].  Will work to reinforce time OOB and supporting/WB R side, increasing awareness of her movement and encouraging self correction as is possible.    Recommendations for follow up therapy are one component of a multi-disciplinary discharge planning process, led by the attending physician.  Recommendations may be updated based on patient status, additional functional criteria and insurance authorization.  Follow Up Recommendations  Acute inpatient rehab (3hours/day)     Assistance Recommended at Discharge Frequent or constant Supervision/Assistance  Patient can return home with the following Two people to help with walking and/or transfers;A lot of help with walking and/or transfers;Help with stairs or ramp for entrance   Equipment Recommendations  Wheelchair cushion (measurements PT);Wheelchair (measurements PT)    Recommendations for Other Services Rehab consult     Precautions / Restrictions Precautions Precautions: Fall Precaution Comments: RUE thumb pain from fall Restrictions Weight Bearing Restrictions: No     Mobility  Bed Mobility Overal bed mobility: Needs Assistance Bed Mobility: Supine to Sit     Supine to sit: Min  assist     General bed mobility comments: minor help RLE and more RUE    Transfers Overall transfer level: Needs assistance Equipment used: Ambulation equipment used Transfers: Sit to/from Stand Sit to Stand: Min assist           General transfer comment: used stedy to control standing on R knee with better standing performance    Ambulation/Gait               General Gait Details: deferred   Stairs             Wheelchair Mobility    Modified Rankin (Stroke Patients Only)       Balance Overall balance assessment: Needs assistance Sitting-balance support: Feet supported Sitting balance-Leahy Scale: Fair     Standing balance support: Single extremity supported;During functional activity Standing balance-Leahy Scale: Poor                              Cognition Arousal/Alertness: Awake/alert Behavior During Therapy: WFL for tasks assessed/performed Overall Cognitive Status: Difficult to assess                                 General Comments: pt is following instructions but with use of stedy kept trying to stand up when moving to chair        Exercises General Exercises - Lower Extremity Long Arc Quad: AROM;AAROM;Strengthening;10 reps Heel Slides: AROM;AAROM;Strengthening;10 reps Hip ABduction/ADduction: AROM;AAROM;Strengthening;10 reps Hip Flexion/Marching: AROM;AAROM;10 reps    General Comments General comments (skin integrity, edema, etc.): pt has some awareness of R  side but attends to L more, has tendency to forget safety information and require tactile cues to reinforce      Pertinent Vitals/Pain Pain Assessment: Faces Faces Pain Scale: Hurts little more Pain Location: R thumb with movement on RUE Pain Descriptors / Indicators: Grimacing;Guarding Pain Intervention(s): Limited activity within patient's tolerance;Monitored during session;Repositioned    Home Living                          Prior  Function            PT Goals (current goals can now be found in the care plan section) Acute Rehab PT Goals Patient Stated Goal: to go home Progress towards PT goals: Progressing toward goals    Frequency    Min 4X/week      PT Plan Current plan remains appropriate    Co-evaluation              AM-PAC PT "6 Clicks" Mobility   Outcome Measure  Help needed turning from your back to your side while in a flat bed without using bedrails?: A Little Help needed moving from lying on your back to sitting on the side of a flat bed without using bedrails?: A Little Help needed moving to and from a bed to a chair (including a wheelchair)?: Total Help needed standing up from a chair using your arms (e.g., wheelchair or bedside chair)?: Total Help needed to walk in hospital room?: Total Help needed climbing 3-5 steps with a railing? : Total 6 Click Score: 10    End of Session Equipment Utilized During Treatment: Gait belt Activity Tolerance: Patient tolerated treatment well Patient left: in chair;with call bell/phone within reach;with chair alarm set Nurse Communication: Mobility status;Other (comment) (instructions for use of stedy back to bed as needed) PT Visit Diagnosis: Other abnormalities of gait and mobility (R26.89);Muscle weakness (generalized) (M62.81)     Time: 1219-7588 PT Time Calculation (min) (ACUTE ONLY): 48 min  Charges:  $Therapeutic Exercise: 8-22 mins $Therapeutic Activity: 23-37 mins             Ramond Dial 10/28/2021, 1:51 PM  Mee Hives, PT PhD Acute Rehab Dept. Number: [place] and [place]

## 2021-10-28 NOTE — TOC Initial Note (Signed)
Transition of Care [facility]) - Initial/Assessment Note    Patient Details  Name: Megan Chaney MRN: [id number] Date of Birth: 1971/09/11  Transition of Care [facility]) CM/SW Contact:    Pollie Friar, RN Phone Number: 10/28/2021, 1:21 PM  Clinical Narrative:                 Patient is from home with her significant other. Recommendations are for Inpatient Rehab. Pts insurance is not in network with [facility]. CM met with the patient and she is agreeable to having her information sent to [facility]. CM has sent information via HUB.  TOC following.  Expected Discharge Plan: IP Rehab Facility Barriers to Discharge: Continued Medical Work up   Patient Goals and CMS Choice     Choice offered to / list presented to : Patient  Expected Discharge Plan and Services Expected Discharge Plan: [place]   Discharge Planning Services: CM Consult Post Acute Care Choice: IP Rehab Living arrangements for the past 2 months: Single Family Home                                      Prior Living Arrangements/Services Living arrangements for the past 2 months: Single Family Home Lives with:: Significant Other Patient language and need for interpreter reviewed:: Yes Do you feel safe going back to the place where you live?: Yes      Need for Family Participation in Patient Care: Yes (Comment) Care giver support system in place?: Yes (comment)   Criminal Activity/Legal Involvement Pertinent to Current Situation/Hospitalization: No - Comment as needed  Activities of Daily Living Home Assistive Devices/Equipment: None ADL Screening (condition at time of admission) Patient's cognitive ability adequate to safely complete daily activities?: No Is the patient deaf or have difficulty hearing?: No Does the patient have difficulty seeing, even when wearing glasses/contacts?: No Does the patient have difficulty concentrating, remembering, or making decisions?: Yes Patient able to express need  for assistance with ADLs?: Yes Does the patient have difficulty dressing or bathing?: Yes Independently performs ADLs?: No Communication: Needs assistance Is this a change from baseline?: Change from baseline, expected to last >3 days Grooming: Needs assistance Is this a change from baseline?: Change from baseline, expected to last >3 days Feeding: Needs assistance Is this a change from baseline?: Change from baseline, expected to last <3 days Bathing: Needs assistance Is this a change from baseline?: Change from baseline, expected to last >3 days Toileting: Needs assistance Is this a change from baseline?: Change from baseline, expected to last >3days In/Out Bed: Needs assistance Is this a change from baseline?: Change from baseline, expected to last >3 days Walks in Home: Needs assistance Is this a change from baseline?: Change from baseline, expected to last >3 days Does the patient have difficulty walking or climbing stairs?: Yes Weakness of Legs: Right Weakness of Arms/Hands: Right  Permission Sought/Granted                  Emotional Assessment Appearance:: Appears stated age Attitude/Demeanor/Rapport: Engaged Affect (typically observed): Accepting Orientation: : Oriented to Self, Oriented to Place, Oriented to  Time, Oriented to Situation   Psych Involvement: No (comment)  Admission diagnosis:  Stroke (cerebrum) ([place]) [I63.9] Acute ischemic stroke [facility]) [I63.9] Patient Active Problem List   Diagnosis Date Noted   Acute ischemic stroke ([place]) 10/26/2021   Infertility counseling 10/04/2019   Enlarged uterus 07/31/2018  Uterine leiomyoma 07/31/2018   DUB (dysfunctional uterine bleeding) 07/31/2018   PCP:  Michael Boston, MD Pharmacy:   [id number] [place] [id number]: [phone]: [phone] [id number] [place], [id number]: [phone]: [phone]   Social Determinants of Health (SDOH) Interventions    Readmission Risk Interventions No flowsheet data found.

## 2021-10-28 NOTE — Progress Notes (Signed)
PROGRESS NOTE    Megan Chaney  WUJ:[id number] DOB: 1971-06-26 DOA: 10/26/2021 PCP: Michael Boston, MD    Brief Narrative:  : Megan Chaney is a 51 y.o. female with medical history significant of chronic iron deficiency anemia secondary to fibroids and menorrhagia on hormone therapy, cigar smoker, GERD, presented with sudden onset of right-sided weakness and aphagia  1/3Neurology was consulted.  Pt family at bedside reports she is little better with speech, but overall still with expressive aphagia. Tries to get her words out  1/4 able to get more words out today compared to yesterday.She reports being on her menstrual cycle currently  Consultants:  Neurology  Procedures:   Antimicrobials:      Subjective: No cp, dizziness, or sob  Objective: Vitals:   10/27/21 2102 10/27/21 2333 10/28/21 0338 10/28/21 0823  BP: 113/71 107/69 121/79 128/71  Pulse: (!) 101 99 (!) 102 98  Resp: 20 17 16 18   Temp: 98.9 F (37.2 C) 97.9 F (36.6 C) 98.2 F (36.8 C) 99.1 F (37.3 C)  TempSrc:  Oral Oral Oral  SpO2: 100% 100% 100% 100%  Weight:      Height:        Intake/Output Summary (Last 24 hours) at 10/28/2021 0836 Last data filed at 10/27/2021 1700 Gross per 24 hour  Intake 60 ml  Output 550 ml  Net -490 ml   Filed Weights   10/26/21 0900  Weight: 73.4 kg    Examination: NAD, calm CTA no wheezing Regular S1-S2 Soft benign positive bowel sounds No edema lower extremity, able to open hand more and lift RUE more than yesterday. Diff. Getting words out . RUE 0/5 strenghth, LUE/LLE 5/5, RLE 4-5/5, no facial droop Awake and alert    Data Reviewed: I have personally reviewed following labs and imaging studies  CBC: Recent Labs  Lab 10/26/21 0940 10/26/21 0948 10/27/21 0326 10/28/21 0300  WBC 14.0*  --   --  10.9*  NEUTROABS 11.2*  --   --   --   HGB 7.2* 8.2* 7.5* 7.5*  HCT 24.2* 24.0* 24.5* 23.4*  MCV 85.5  --   --  82.1  PLT 457*  --   --  956   Basic  Metabolic Panel: Recent Labs  Lab 10/26/21 0940 10/26/21 0948  NA 138 138  K 4.0 3.9  CL 107 107  CO2 21*  --   GLUCOSE 118* 116*  BUN 15 15  CREATININE 0.64 0.60  CALCIUM 8.8*  --    GFR: Estimated Creatinine Clearance: 82.5 mL/min (by C-G formula based on SCr of 0.6 mg/dL). Liver Function Tests: Recent Labs  Lab 10/26/21 0940  AST 28  ALT 25  ALKPHOS 57  BILITOT 0.4  PROT 6.5  ALBUMIN 3.7   No results for input(s): LIPASE, AMYLASE in the last 168 hours. No results for input(s): AMMONIA in the last 168 hours. Coagulation Profile: Recent Labs  Lab 10/26/21 0940  INR 0.9   Cardiac Enzymes: No results for input(s): CKTOTAL, CKMB, CKMBINDEX, TROPONINI in the last 168 hours. BNP (last 3 results) No results for input(s): PROBNP in the last 8760 hours. HbA1C: Recent Labs    10/26/21 0940  HGBA1C 4.3*   CBG: Recent Labs  Lab 10/26/21 0938  GLUCAP 103*   Lipid Profile: Recent Labs    10/26/21 0940 10/27/21 0326  CHOL  --  145  HDL  --  52  LDLCALC  --  79  TRIG  --  69  CHOLHDL  --  2.8  LDLDIRECT 86.3  --    Thyroid Function Tests: Recent Labs    10/26/21 1658  TSH 4.901*   Anemia Panel: Recent Labs    10/26/21 1658  TIBC 430  IRON 11*   Sepsis Labs: No results for input(s): PROCALCITON, LATICACIDVEN in the last 168 hours.  Recent Results (from the past 240 hour(s))  Resp Panel by RT-PCR (Flu A&B, Covid) Nasopharyngeal Swab     Status: None   Collection Time: 10/27/21  3:55 PM   Specimen: Nasopharyngeal Swab; Nasopharyngeal(NP) swabs in vial transport medium  Result Value Ref Range Status   SARS Coronavirus 2 by RT PCR NEGATIVE NEGATIVE Final    Comment: (NOTE) SARS-CoV-2 target nucleic acids are NOT DETECTED.  The SARS-CoV-2 RNA is generally detectable in upper respiratory specimens during the acute phase of infection. The lowest concentration of SARS-CoV-2 viral copies this assay can detect is 138 copies/mL. A negative result does  not preclude SARS-Cov-2 infection and should not be used as the sole basis for treatment or other patient management decisions. A negative result may occur with  improper specimen collection/handling, submission of specimen other than nasopharyngeal swab, presence of viral mutation(s) within the areas targeted by this assay, and inadequate number of viral copies(<138 copies/mL). A negative result must be combined with clinical observations, patient history, and epidemiological information. The expected result is Negative.  Fact Sheet for Patients:  EntrepreneurPulse.com.au  Fact Sheet for Healthcare Providers:  IncredibleEmployment.be  This test is no t yet approved or cleared by the [place] FDA and  has been authorized for detection and/or diagnosis of SARS-CoV-2 by FDA under an Emergency Use Authorization (EUA). This EUA will remain  in effect (meaning this test can be used) for the duration of the COVID-19 declaration under Section 564(b)(1) of the Act, 21 U.S.C.section 360bbb-3(b)(1), unless the authorization is terminated  or revoked sooner.       Influenza A by PCR NEGATIVE NEGATIVE Final   Influenza B by PCR NEGATIVE NEGATIVE Final    Comment: (NOTE) The Xpert Xpress SARS-CoV-2/FLU/RSV plus assay is intended as an aid in the diagnosis of influenza from Nasopharyngeal swab specimens and should not be used as a sole basis for treatment. Nasal washings and aspirates are unacceptable for Xpert Xpress SARS-CoV-2/FLU/RSV testing.  Fact Sheet for Patients: EntrepreneurPulse.com.au  Fact Sheet for Healthcare Providers: IncredibleEmployment.be  This test is not yet approved or cleared by the [place] FDA and has been authorized for detection and/or diagnosis of SARS-CoV-2 by FDA under an Emergency Use Authorization (EUA). This EUA will remain in effect (meaning this test can be used) for the  duration of the COVID-19 declaration under Section 564(b)(1) of the Act, 21 U.S.C. section 360bbb-3(b)(1), unless the authorization is terminated or revoked.  Performed at [facility], [place] [address], [place], [place] [address]          Radiology Studies: MR BRAIN WO CONTRAST  Result Date: 10/26/2021 CLINICAL DATA:  Neuro deficit, acute, stroke suspected. Right-sided weakness. EXAM: MRI HEAD WITHOUT CONTRAST TECHNIQUE: Multiplanar, multiecho pulse sequences of the brain and surrounding structures were obtained without intravenous contrast. COMPARISON:  CT head without contrast. CT perfusion. CTA head and neck 10/26/2021. FINDINGS: Brain: Multifocal areas restricted diffusion are present in the left MCA territory, predominantly inferior and posteriorly. There is some involvement of the left middle temporal gyrus. Cortical involvement is present in the posterior left parietal lobe as well as the more superior left parietal lobe. Areas of cortical  infarction are present in the anterior left frontal lobe involving the middle and inferior frontal cortex. No acute hemorrhage present. The study is moderately degraded by patient motion. Right hemisphere is within normal limits. The ventricles are of normal size. No significant extraaxial fluid collection is present. The internal auditory canals are within normal limits. The brainstem and cerebellum are within normal limits. Vascular: Flow is present in the major intracranial arteries. Skull and upper cervical spine: The craniocervical junction is normal. Upper cervical spine is within normal limits. Marrow signal is unremarkable. Sinuses/Orbits: The paranasal sinuses and mastoid air cells are clear. The globes and orbits are within normal limits. IMPRESSION: 1. Multifocal acute/subacute nonhemorrhagic infarcts involving the left MCA territory. Findings are predominantly inferior and posteriorly. There is cortical infarction also involves the more  anterior middle and inferior frontal lobes. 2. No acute hemorrhage. These results were called by telephone at the time of interpretation on 10/26/2021 at 2:52 pm to provider [facility] , who verbally acknowledged these results. Electronically Signed   By: San Morelle M.D.   On: 10/26/2021 14:54   CT C-SPINE NO CHARGE  Result Date: 10/26/2021 CLINICAL DATA:  Trauma, found down EXAM: CT CERVICAL SPINE WITHOUT CONTRAST TECHNIQUE: Multidetector CT imaging of the cervical spine was performed without intravenous contrast. Multiplanar CT image reconstructions were also generated. COMPARISON:  None. FINDINGS: Alignment: Alignment of posterior margins of vertebral bodies is unremarkable. Degenerative changes are noted with bony spurs at C4-C5 and C5-C6 levels. Skull base and vertebrae: No recent fracture is seen. Soft tissues and spinal canal: There is no significant spinal stenosis. Prevertebral soft tissues are unremarkable. Disc levels: There is mild encroachment of neural foramina at C4-C5 and C5-C6 levels anterior bony spurs seen in the upper thoracic spine. Upper chest: Unremarkable. Other: Thyroid is enlarged in size. IMPRESSION: No recent fracture is seen in the cervical spine. Alignment of posterior margins of vertebral bodies is unremarkable. Cervical spondylosis with mild encroachment of neural foramina at C4-C5 and C5-C6 levels. Electronically Signed   By: Elmer Picker M.D.   On: 10/26/2021 11:19   EEG adult  Result Date: 10/27/2021 Lora Havens, MD     10/27/2021  7:42 AM Patient Name: Megan Chaney MRN: [id number] Epilepsy Attending: Lora Havens Referring Physician/Provider: Dr Su Monks Date 10/26/2021 Duration: 21.09 mins Patient history: 51 year old woman with a past medical his history significant for fibroids who presented as a pretty activated stroke code with EMS for aphasia and right-sided weakness c/f acute ischemic stroke. EEG to evaluate for seizure Level of  alertness: Awake AEDs during EEG study: None Technical aspects: This EEG study was done with scalp electrodes positioned according to the 10-20 International system of electrode placement. Electrical activity was acquired at a sampling rate of 500Hz  and reviewed with a high frequency filter of 70Hz  and a low frequency filter of 1Hz . EEG data were recorded continuously and digitally stored. Description: The posterior dominant rhythm consists of 10 Hz activity of moderate voltage (25-35 uV) seen predominantly in posterior head regions, asymmetric (left<right) and reactive to eye opening and eye closing.  Hyperventilation and photic stimulation were not performed.   ABNORMALITY - Background asymmetry, left<right IMPRESSION: This study is suggestive of cortical dysfunction arising from left occipital region likely secondary to underlying structural abnormality/stroke. No seizures or epileptiform discharges were seen throughout the recording. Priyanka [name]   Overnight EEG with video  Result Date: 10/27/2021 Lora Havens, MD     10/27/2021  9:47 AM  Patient Name: Megan Chaney MRN: [id number] Epilepsy Attending: Lora Havens Referring Physician/Provider: Dr Su Monks Duration: 10/26/2021 9937 to 10/27/2021 0848  Patient history: 51 year old woman with a past medical his history significant for fibroids who presented as a pretty activated stroke code with EMS for aphasia and right-sided weakness c/f acute ischemic stroke. EEG to evaluate for seizure  Level of alertness: Awake, asleep  AEDs during EEG study: None  Technical aspects: This EEG study was done with scalp electrodes positioned according to the 10-20 International system of electrode placement. Electrical activity was acquired at a sampling rate of 500Hz  and reviewed with a high frequency filter of 70Hz  and a low frequency filter of 1Hz . EEG data were recorded continuously and digitally stored.  Description: The posterior dominant rhythm consists of  10 Hz activity of moderate voltage (25-35 uV) seen predominantly in posterior head regions, asymmetric (left<right) and reactive to eye opening and eye closing.  Hyperventilation and photic stimulation were not performed.    ABNORMALITY - Background asymmetry, left<right  IMPRESSION: This study is suggestive of cortical dysfunction arising from left occipital region likely secondary to underlying structural abnormality/stroke. No seizures or epileptiform discharges were seen throughout the recording.  Priyanka O Yadav   VAS [place] TRANSCRANIAL DOPPLER W BUBBLES  Result Date: 10/27/2021  Transcranial Doppler with Bubble Patient Name:  Megan Chaney  Date of Exam:   10/27/2021 Medical Rec #: [id number]        Accession #:    [id number] Date of Birth: 03/16/1971        Patient Gender: F Patient Age:   31 years Exam Location:  [facility] Procedure:      VAS [place] TRANSCRANIAL DOPPLER W BUBBLES Referring Phys: Elwin Sleight DE LA TORRE --------------------------------------------------------------------------------  Indications: Stroke. Comparison Study: No prior studies. Performing Technologist: Darlin Coco RDMS, RVT  Examination Guidelines: A complete evaluation includes B-mode imaging, spectral Doppler, color Doppler, and power Doppler as needed of all accessible portions of each vessel. Bilateral testing is considered an integral part of a complete examination. Limited examinations for reoccurring indications may be performed as noted.  Summary: No HITS at rest or during Valsalva. Negative transcranial Doppler Bubble study with no evidence of right to left intracardiac communication.  *See table(s) above for TCD measurements and observations.    Preliminary    ECHOCARDIOGRAM COMPLETE BUBBLE STUDY  Result Date: 10/27/2021    ECHOCARDIOGRAM REPORT   Patient Name:   Megan Chaney Date of Exam: 10/27/2021 Medical Rec #:  [id number]       Height:       63.0 in Accession #:    [id number]      Weight:       161.8 lb Date of  Birth:  06-14-1971       BSA:          1.767 m Patient Age:    13 years        BP:           132/98 mmHg Patient Gender: F               HR:           97 bpm. Exam Location:  Inpatient Procedure: 2D Echo, Cardiac Doppler, Color Doppler and Intracardiac            Opacification Agent Indications:    Stroke, bubble  History:        Patient has no prior history of Echocardiogram examinations.  Sonographer:  Glo Herring Referring Phys: [place]  1. Left ventricular ejection fraction, by estimation, is 60 to 65%. The left ventricle has normal function. The left ventricle has no regional wall motion abnormalities. Left ventricular diastolic parameters were normal.  2. Right ventricular systolic function is normal. The right ventricular size is normal.  3. The mitral valve is normal in structure. No evidence of mitral valve regurgitation. No evidence of mitral stenosis.  4. The aortic valve is tricuspid. Aortic valve regurgitation is not visualized. No aortic stenosis is present.  5. The inferior vena cava is normal in size with greater than 50% respiratory variability, suggesting right atrial pressure of 3 mmHg.  6. Agitated saline contrast bubble study was negative, with no evidence of any interatrial shunt. FINDINGS  Left Ventricle: Left ventricular ejection fraction, by estimation, is 60 to 65%. The left ventricle has normal function. The left ventricle has no regional wall motion abnormalities. The left ventricular internal cavity size was normal in size. There is  no left ventricular hypertrophy. Left ventricular diastolic parameters were normal. Right Ventricle: The right ventricular size is normal. No increase in right ventricular wall thickness. Right ventricular systolic function is normal. Left Atrium: Left atrial size was normal in size. Right Atrium: Right atrial size was normal in size. Pericardium: There is no evidence of pericardial effusion. Mitral Valve: The mitral valve is  normal in structure. No evidence of mitral valve regurgitation. No evidence of mitral valve stenosis. Tricuspid Valve: The tricuspid valve is normal in structure. Tricuspid valve regurgitation is trivial. No evidence of tricuspid stenosis. Aortic Valve: The aortic valve is tricuspid. Aortic valve regurgitation is not visualized. No aortic stenosis is present. Aortic valve mean gradient measures 5.0 mmHg. Aortic valve peak gradient measures 8.2 mmHg. Aortic valve area, by VTI measures 1.74 cm. Pulmonic Valve: The pulmonic valve was not well visualized. Pulmonic valve regurgitation is trivial. No evidence of pulmonic stenosis. Aorta: The aortic root is normal in size and structure. Venous: The inferior vena cava is normal in size with greater than 50% respiratory variability, suggesting right atrial pressure of 3 mmHg. IAS/Shunts: No atrial level shunt detected by color flow Doppler. Agitated saline contrast was given intravenously to evaluate for intracardiac shunting. Agitated saline contrast bubble study was negative, with no evidence of any interatrial shunt.  LEFT VENTRICLE PLAX 2D LVOT diam:     2.00 cm LV SV:         46 LV SV Index:   26 LVOT Area:     3.14 cm  IVC IVC diam: 1.30 cm LEFT ATRIUM           Index LA Vol (A2C): 31.9 ml 18.05 ml/m LA Vol (A4C): 48.3 ml 27.33 ml/m  AORTIC VALVE                     PULMONIC VALVE AV Area (Vmax):    1.83 cm      PV Vmax:       0.99 m/s AV Area (Vmean):   1.60 cm      PV Peak grad:  3.9 mmHg AV Area (VTI):     1.74 cm AV Vmax:           143.00 cm/s AV Vmean:          102.000 cm/s AV VTI:            0.265 m AV Peak Grad:      8.2 mmHg AV Mean Grad:  5.0 mmHg LVOT Vmax:         83.30 cm/s LVOT Vmean:        51.900 cm/s LVOT VTI:          0.147 m LVOT/AV VTI ratio: 0.55  AORTA Ao Root diam: 2.90 cm Ao Asc diam:  2.90 cm  SHUNTS Systemic VTI:  0.15 m Systemic Diam: 2.00 cm Kardie Tobb DO Electronically signed by Berniece Salines DO Signature Date/Time:  10/27/2021/3:32:16 PM    Final    CT HEAD CODE STROKE WO CONTRAST  Result Date: 10/26/2021 CLINICAL DATA:  Code stroke.  Right-sided weakness EXAM: CT HEAD WITHOUT CONTRAST TECHNIQUE: Contiguous axial images were obtained from the base of the skull through the vertex without intravenous contrast. COMPARISON:  None. FINDINGS: Brain: No evidence of acute infarction, hemorrhage, hydrocephalus, extra-axial collection or mass lesion/mass effect. Vascular: No hyperdense vessel or unexpected calcification. Skull: Normal. Negative for fracture or focal lesion. Sinuses/Orbits: No acute finding. Other: These results were communicated to [facility] at 10:00 am on 10/26/2021 by text page via the [facility] messaging system. ASPECTS [facility] Stroke Program Early CT Score) - Ganglionic level infarction (caudate, lentiform nuclei, internal capsule, insula, M1-M3 cortex): 7 - Supraganglionic infarction (M4-M6 cortex): 3 Total score (0-10 with 10 being normal): 10 IMPRESSION: No acute finding.  ASPECTS is 10. Electronically Signed   By: Jorje Guild M.D.   On: 10/26/2021 10:01   VAS [place] LOWER EXTREMITY VENOUS (DVT)  Result Date: 10/27/2021  Lower Venous DVT Study Patient Name:  Megan Chaney  Date of Exam:   10/27/2021 Medical Rec #: [id number]        Accession #:    [id number] Date of Birth: 1971-10-12        Patient Gender: F Patient Age:   84 years Exam Location:  [facility] Procedure:      VAS [place] LOWER EXTREMITY VENOUS (DVT) Referring Phys: Elwin Sleight DE LA TORRE --------------------------------------------------------------------------------  Indications: Stroke.  Comparison Study: No prior studies. Performing Technologist: Darlin Coco RDMS, RVT  Examination Guidelines: A complete evaluation includes B-mode imaging, spectral Doppler, color Doppler, and power Doppler as needed of all accessible portions of each vessel. Bilateral testing is considered an integral part of a complete examination. Limited examinations for reoccurring  indications may be performed as noted. The reflux portion of the exam is performed with the patient in reverse Trendelenburg.  +---------+---------------+---------+-----------+----------+--------------+  RIGHT     Compressibility Phasicity Spontaneity Properties Thrombus Aging  +---------+---------------+---------+-----------+----------+--------------+  CFV       Full            Yes       Yes                                    +---------+---------------+---------+-----------+----------+--------------+  SFJ       Full                                                             +---------+---------------+---------+-----------+----------+--------------+  FV Prox   Full                                                             +---------+---------------+---------+-----------+----------+--------------+  FV Mid    Full                                                             +---------+---------------+---------+-----------+----------+--------------+  FV Distal Full                                                             +---------+---------------+---------+-----------+----------+--------------+  PFV       Full                                                             +---------+---------------+---------+-----------+----------+--------------+  POP       Full            Yes       Yes                                    +---------+---------------+---------+-----------+----------+--------------+  PTV       Full                                                             +---------+---------------+---------+-----------+----------+--------------+  PERO      Full                                                             +---------+---------------+---------+-----------+----------+--------------+   +---------+---------------+---------+-----------+----------+--------------+  LEFT      Compressibility Phasicity Spontaneity Properties Thrombus Aging   +---------+---------------+---------+-----------+----------+--------------+  CFV       Full            Yes       Yes                                    +---------+---------------+---------+-----------+----------+--------------+  SFJ       Full                                                             +---------+---------------+---------+-----------+----------+--------------+  FV Prox   Full                                                             +---------+---------------+---------+-----------+----------+--------------+  FV Mid    Full                                                             +---------+---------------+---------+-----------+----------+--------------+  FV Distal Full                                                             +---------+---------------+---------+-----------+----------+--------------+  PFV       Full                                                             +---------+---------------+---------+-----------+----------+--------------+  POP       Full            Yes       Yes                                    +---------+---------------+---------+-----------+----------+--------------+  PTV       Full                                                             +---------+---------------+---------+-----------+----------+--------------+  PERO      Full                                                             +---------+---------------+---------+-----------+----------+--------------+     Summary: RIGHT: - There is no evidence of deep vein thrombosis in the lower extremity.  - No cystic structure found in the popliteal fossa.  LEFT: - There is no evidence of deep vein thrombosis in the lower extremity.  - No cystic structure found in the popliteal fossa.  *See table(s) above for measurements and observations. Electronically signed by Deitra Mayo MD on 10/27/2021 at 5:57:09 PM.    Final    CT ANGIO HEAD NECK W WO CM W PERF (CODE STROKE)  Result Date: 10/26/2021 CLINICAL  DATA:  Right-sided weakness EXAM: CT ANGIOGRAPHY HEAD AND NECK CT PERFUSION BRAIN TECHNIQUE: Multidetector CT imaging of the head and neck was performed using the standard protocol during bolus administration of intravenous contrast. Multiplanar CT image reconstructions and MIPs were obtained to evaluate the vascular anatomy. Carotid stenosis measurements (when applicable) are obtained utilizing NASCET criteria, using the distal internal carotid diameter as the denominator. Multiphase CT imaging of the brain was performed following IV bolus contrast injection. Subsequent parametric perfusion maps were calculated using RAPID software. CONTRAST:  Dose is not known on this in progress  study COMPARISON:  None. FINDINGS: CTA NECK FINDINGS Aortic arch: Normal Right carotid system: Vessels are smooth and widely patent Left carotid system: Vessels are smooth and widely patent Vertebral arteries: No proximal subclavian stenosis. Skeleton: Negative Other neck: No acute finding Upper chest: No acute finding Review of the MIP images confirms the above findings CTA HEAD FINDINGS Anterior circulation: No emergent large vessel occlusion. No proximal flow limiting stenosis, beading, or aneurysm Posterior circulation:  No emergent finding Venous sinuses: Unremarkable Review of the MIP images confirms the above findings CT Brain Perfusion Findings: ASPECTS: 10 CBF (<30%) Volume: 22mL Perfusion (Tmax>6.0s) volume: 67mL. With a 4 second threshold there is and even greater area of diminished perfusion in the left cerebral hemisphere, question shower of emboli or autolysis. These results were called by telephone at the time of interpretation on 10/26/2021 at 10:15 am to provider [facility] , who verbally acknowledged these results. IMPRESSION: 1. No emergent large vessel occlusion. 2. CT perfusion shows ischemic range blood flow in the left frontal lobe, suspect peripheral emboli. 3. No embolic source or significant atheromatous disease  seen in the neck. Electronically Signed   By: Jorje Guild M.D.   On: 10/26/2021 10:18        Scheduled Meds:   stroke: mapping our early stages of recovery book   Does not apply Once   aspirin  81 mg Oral Daily   atorvastatin  40 mg Oral Daily   calcium-vitamin D  1 tablet Oral BID   clopidogrel  75 mg Oral Daily   enoxaparin (LOVENOX) injection  40 mg Subcutaneous Q24H   ferrous sulfate  325 mg Oral TID WC   megestrol  40 mg Oral BID   nicotine  21 mg Transdermal Daily   Vitamin D (Ergocalciferol)  50,000 Units Oral Q Wed   Continuous Infusions:  Assessment & Plan:   Principal Problem:   Acute ischemic stroke [facility])   Stroke: Left middle cerebral artery branch infarct likely secondary due to embolism of cryptogenic source Please see CT/MRI result EF nml EF. No shunt, neg. Bubble LE neg dvt Goal LDL <70 A1c 4.3 On dysphagia 1 diet Avoid hormonal therapy Asa 81 and plavix 75mg  x3 month followed by asa alone PT/OT for CIR evaluation Continue neurochecks Stat CT if neuro exam changes Permissive hypertension x24 hrs EEG negative Urine drug +marij. 1/4 neurology recommends TEE for cardiac source of embolism Loop recorder for paroxysmal A. fib and follow hypercoagulable panel and vasculitic labs Plan for CIR     Anemia Due to menorrhagia.  Patient reports being on her period currently Monitor H&H Does have fibroids will need to follow-up GYN as outpatient Placed on iron to continue   Marijuana use Cessation    DVT prophylaxis: lovenox Code Status:full Family Communication: None at bedside Disposition Plan: CIR Status is: Inpatient  Remains inpatient appropriate because: IV treatment, w/u pending            LOS: 2 days   Time spent: 35 minutes with more than 50% on [place], MD [facility] Pager [phone]  If 7PM-7AM, please contact night-coverage 10/28/2021, 8:36 AM

## 2021-10-29 DIAGNOSIS — I639 Cerebral infarction, unspecified: Secondary | ICD-10-CM | POA: Diagnosis not present

## 2021-10-29 LAB — HEMOGLOBIN AND HEMATOCRIT, BLOOD
HCT: 26.8 % — ABNORMAL LOW (ref 36.0–46.0)
Hemoglobin: 8.5 g/dL — ABNORMAL LOW (ref 12.0–15.0)

## 2021-10-29 NOTE — Progress Notes (Signed)
PROGRESS NOTE    Megan Chaney  GUR:[id number] DOB: June 17, 1971 DOA: 10/26/2021 PCP: Michael Boston, MD    Brief Narrative:  : Megan Chaney is a 51 y.o. female with medical history significant of chronic iron deficiency anemia secondary to fibroids and menorrhagia on hormone therapy, cigar smoker, GERD, presented with sudden onset of right-sided weakness and aphagia  1/3Neurology was consulted.  Pt family at bedside reports she is little better with speech, but overall still with expressive aphagia. Tries to get her words out  1/4 able to get more words out today compared to yesterday.She reports being on her menstrual cycle currently  1/5 able to get more words out today and lift rue more. TEE tomorrow  Consultants:  Neurology  Procedures:   Antimicrobials:      Subjective: Denies sob, dizziness, cp, lightheadedness  Objective: Vitals:   10/28/21 1439 10/28/21 2348 10/29/21 0405 10/29/21 0756  BP: 138/80 112/75 130/83 118/70  Pulse: (!) 107 (!) 103 96 (!) 103  Resp: 14 17 16 16   Temp: 99.5 F (37.5 C) (!) 97.5 F (36.4 C) 97.7 F (36.5 C) 97.8 F (36.6 C)  TempSrc: Oral Oral Oral Oral  SpO2: 100% 100% 100% 100%  Weight:      Height:        Intake/Output Summary (Last 24 hours) at 10/29/2021 1003 Last data filed at 10/29/2021 0820 Gross per 24 hour  Intake 360 ml  Output 525 ml  Net -165 ml   Filed Weights   10/26/21 0900  Weight: 73.4 kg    Examination: Nad, calm Cta no w/r Reg s1 s2 no gallop Soft benign +bs No edema Awake and alert. No facial droop, still hard time getting words out but \\doing  better than previous  Lift RUE mid way, able to squeeze hand more but stregth is till poor 0/5.    Data Reviewed: I have personally reviewed following labs and imaging studies  CBC: Recent Labs  Lab 10/26/21 0940 10/26/21 0948 10/27/21 0326 10/28/21 0300  WBC 14.0*  --   --  10.9*  NEUTROABS 11.2*  --   --   --   HGB 7.2* 8.2* 7.5* 7.5*  HCT 24.2*  24.0* 24.5* 23.4*  MCV 85.5  --   --  82.1  PLT 457*  --   --  283   Basic Metabolic Panel: Recent Labs  Lab 10/26/21 0940 10/26/21 0948  NA 138 138  K 4.0 3.9  CL 107 107  CO2 21*  --   GLUCOSE 118* 116*  BUN 15 15  CREATININE 0.64 0.60  CALCIUM 8.8*  --    GFR: Estimated Creatinine Clearance: 82.5 mL/min (by C-G formula based on SCr of 0.6 mg/dL). Liver Function Tests: Recent Labs  Lab 10/26/21 0940  AST 28  ALT 25  ALKPHOS 57  BILITOT 0.4  PROT 6.5  ALBUMIN 3.7   No results for input(s): LIPASE, AMYLASE in the last 168 hours. No results for input(s): AMMONIA in the last 168 hours. Coagulation Profile: Recent Labs  Lab 10/26/21 0940  INR 0.9   Cardiac Enzymes: No results for input(s): CKTOTAL, CKMB, CKMBINDEX, TROPONINI in the last 168 hours. BNP (last 3 results) No results for input(s): PROBNP in the last 8760 hours. HbA1C: Recent Labs    10/27/21 0326  HGBA1C <4.2*   CBG: Recent Labs  Lab 10/26/21 0938  GLUCAP 103*   Lipid Profile: Recent Labs    10/27/21 0326  CHOL 145  HDL 52  LDLCALC  79  TRIG 69  CHOLHDL 2.8   Thyroid Function Tests: Recent Labs    10/26/21 1658  TSH 4.901*   Anemia Panel: Recent Labs    10/26/21 1658  TIBC 430  IRON 11*   Sepsis Labs: No results for input(s): PROCALCITON, LATICACIDVEN in the last 168 hours.  Recent Results (from the past 240 hour(s))  Resp Panel by RT-PCR (Flu A&B, Covid) Nasopharyngeal Swab     Status: None   Collection Time: 10/27/21  3:55 PM   Specimen: Nasopharyngeal Swab; Nasopharyngeal(NP) swabs in vial transport medium  Result Value Ref Range Status   SARS Coronavirus 2 by RT PCR NEGATIVE NEGATIVE Final    Comment: (NOTE) SARS-CoV-2 target nucleic acids are NOT DETECTED.  The SARS-CoV-2 RNA is generally detectable in upper respiratory specimens during the acute phase of infection. The lowest concentration of SARS-CoV-2 viral copies this assay can detect is 138 copies/mL. A  negative result does not preclude SARS-Cov-2 infection and should not be used as the sole basis for treatment or other patient management decisions. A negative result may occur with  improper specimen collection/handling, submission of specimen other than nasopharyngeal swab, presence of viral mutation(s) within the areas targeted by this assay, and inadequate number of viral copies(<138 copies/mL). A negative result must be combined with clinical observations, patient history, and epidemiological information. The expected result is Negative.  Fact Sheet for Patients:  EntrepreneurPulse.com.au  Fact Sheet for Healthcare Providers:  IncredibleEmployment.be  This test is no t yet approved or cleared by the [place] FDA and  has been authorized for detection and/or diagnosis of SARS-CoV-2 by FDA under an Emergency Use Authorization (EUA). This EUA will remain  in effect (meaning this test can be used) for the duration of the COVID-19 declaration under Section 564(b)(1) of the Act, 21 U.S.C.section 360bbb-3(b)(1), unless the authorization is terminated  or revoked sooner.       Influenza A by PCR NEGATIVE NEGATIVE Final   Influenza B by PCR NEGATIVE NEGATIVE Final    Comment: (NOTE) The Xpert Xpress SARS-CoV-2/FLU/RSV plus assay is intended as an aid in the diagnosis of influenza from Nasopharyngeal swab specimens and should not be used as a sole basis for treatment. Nasal washings and aspirates are unacceptable for Xpert Xpress SARS-CoV-2/FLU/RSV testing.  Fact Sheet for Patients: EntrepreneurPulse.com.au  Fact Sheet for Healthcare Providers: IncredibleEmployment.be  This test is not yet approved or cleared by the [place] FDA and has been authorized for detection and/or diagnosis of SARS-CoV-2 by FDA under an Emergency Use Authorization (EUA). This EUA will remain in effect (meaning this test can  be used) for the duration of the COVID-19 declaration under Section 564(b)(1) of the Act, 21 U.S.C. section 360bbb-3(b)(1), unless the authorization is terminated or revoked.  Performed at [facility], [place] [address], [place], [place] [address]          Radiology Studies: VAS [place] TRANSCRANIAL DOPPLER W BUBBLES  Result Date: 10/28/2021  Transcranial Doppler with Bubble Patient Name:  QUERIDA BERETTA  Date of Exam:   10/27/2021 Medical Rec #: [id number]        Accession #:    [id number] Date of Birth: 04-29-1971        Patient Gender: F Patient Age:   75 years Exam Location:  [facility] Procedure:      VAS [place] TRANSCRANIAL DOPPLER W BUBBLES Referring Phys: Elwin Sleight DE LA TORRE --------------------------------------------------------------------------------  Indications: Stroke. Comparison Study: No prior studies. Performing Technologist: Darlin Coco  RDMS, RVT  Examination Guidelines: A complete evaluation includes B-mode imaging, spectral Doppler, color Doppler, and power Doppler as needed of all accessible portions of each vessel. Bilateral testing is considered an integral part of a complete examination. Limited examinations for reoccurring indications may be performed as noted.  Summary: No HITS at rest or during Valsalva. Negative transcranial Doppler Bubble study with no evidence of right to left intracardiac communication.  Negative TCD Bubble study *See table(s) above for TCD measurements and observations.  Diagnosing physician: Antony Contras MD Electronically signed by Antony Contras MD on 10/28/2021 at 1:18:58 PM.    Final    ECHOCARDIOGRAM COMPLETE BUBBLE STUDY  Result Date: 10/27/2021    ECHOCARDIOGRAM REPORT   Patient Name:   DOXIE AUGENSTEIN Date of Exam: 10/27/2021 Medical Rec #:  [id number]       Height:       63.0 in Accession #:    [id number]      Weight:       161.8 lb Date of Birth:  May 07, 1971       BSA:          1.767 m Patient Age:    75 years        BP:           132/98 mmHg  Patient Gender: F               HR:           97 bpm. Exam Location:  Inpatient Procedure: 2D Echo, Cardiac Doppler, Color Doppler and Intracardiac            Opacification Agent Indications:    Stroke, bubble  History:        Patient has no prior history of Echocardiogram examinations.  Sonographer:    Glo Herring Referring Phys: [place]  1. Left ventricular ejection fraction, by estimation, is 60 to 65%. The left ventricle has normal function. The left ventricle has no regional wall motion abnormalities. Left ventricular diastolic parameters were normal.  2. Right ventricular systolic function is normal. The right ventricular size is normal.  3. The mitral valve is normal in structure. No evidence of mitral valve regurgitation. No evidence of mitral stenosis.  4. The aortic valve is tricuspid. Aortic valve regurgitation is not visualized. No aortic stenosis is present.  5. The inferior vena cava is normal in size with greater than 50% respiratory variability, suggesting right atrial pressure of 3 mmHg.  6. Agitated saline contrast bubble study was negative, with no evidence of any interatrial shunt. FINDINGS  Left Ventricle: Left ventricular ejection fraction, by estimation, is 60 to 65%. The left ventricle has normal function. The left ventricle has no regional wall motion abnormalities. The left ventricular internal cavity size was normal in size. There is  no left ventricular hypertrophy. Left ventricular diastolic parameters were normal. Right Ventricle: The right ventricular size is normal. No increase in right ventricular wall thickness. Right ventricular systolic function is normal. Left Atrium: Left atrial size was normal in size. Right Atrium: Right atrial size was normal in size. Pericardium: There is no evidence of pericardial effusion. Mitral Valve: The mitral valve is normal in structure. No evidence of mitral valve regurgitation. No evidence of mitral valve stenosis.  Tricuspid Valve: The tricuspid valve is normal in structure. Tricuspid valve regurgitation is trivial. No evidence of tricuspid stenosis. Aortic Valve: The aortic valve is tricuspid. Aortic valve regurgitation is not visualized. No aortic stenosis is present. Aortic  valve mean gradient measures 5.0 mmHg. Aortic valve peak gradient measures 8.2 mmHg. Aortic valve area, by VTI measures 1.74 cm. Pulmonic Valve: The pulmonic valve was not well visualized. Pulmonic valve regurgitation is trivial. No evidence of pulmonic stenosis. Aorta: The aortic root is normal in size and structure. Venous: The inferior vena cava is normal in size with greater than 50% respiratory variability, suggesting right atrial pressure of 3 mmHg. IAS/Shunts: No atrial level shunt detected by color flow Doppler. Agitated saline contrast was given intravenously to evaluate for intracardiac shunting. Agitated saline contrast bubble study was negative, with no evidence of any interatrial shunt.  LEFT VENTRICLE PLAX 2D LVOT diam:     2.00 cm LV SV:         46 LV SV Index:   26 LVOT Area:     3.14 cm  IVC IVC diam: 1.30 cm LEFT ATRIUM           Index LA Vol (A2C): 31.9 ml 18.05 ml/m LA Vol (A4C): 48.3 ml 27.33 ml/m  AORTIC VALVE                     PULMONIC VALVE AV Area (Vmax):    1.83 cm      PV Vmax:       0.99 m/s AV Area (Vmean):   1.60 cm      PV Peak grad:  3.9 mmHg AV Area (VTI):     1.74 cm AV Vmax:           143.00 cm/s AV Vmean:          102.000 cm/s AV VTI:            0.265 m AV Peak Grad:      8.2 mmHg AV Mean Grad:      5.0 mmHg LVOT Vmax:         83.30 cm/s LVOT Vmean:        51.900 cm/s LVOT VTI:          0.147 m LVOT/AV VTI ratio: 0.55  AORTA Ao Root diam: 2.90 cm Ao Asc diam:  2.90 cm  SHUNTS Systemic VTI:  0.15 m Systemic Diam: 2.00 cm Kardie Tobb DO Electronically signed by Berniece Salines DO Signature Date/Time: 10/27/2021/3:32:16 PM    Final    VAS [place] LOWER EXTREMITY VENOUS (DVT)  Result Date: 10/27/2021  Lower Venous DVT  Study Patient Name:  VALECIA BESKE  Date of Exam:   10/27/2021 Medical Rec #: [id number]        Accession #:    [id number] Date of Birth: 01-Jun-1971        Patient Gender: F Patient Age:   84 years Exam Location:  [facility] Procedure:      VAS [place] LOWER EXTREMITY VENOUS (DVT) Referring Phys: Elwin Sleight DE [place] TORRE --------------------------------------------------------------------------------  Indications: Stroke.  Comparison Study: No prior studies. Performing Technologist: Darlin Coco RDMS, RVT  Examination Guidelines: A complete evaluation includes B-mode imaging, spectral Doppler, color Doppler, and power Doppler as needed of all accessible portions of each vessel. Bilateral testing is considered an integral part of a complete examination. Limited examinations for reoccurring indications may be performed as noted. The reflux portion of the exam is performed with the patient in reverse Trendelenburg.  +---------+---------------+---------+-----------+----------+--------------+  RIGHT     Compressibility Phasicity Spontaneity Properties Thrombus Aging  +---------+---------------+---------+-----------+----------+--------------+  CFV       Full  Yes       Yes                                    +---------+---------------+---------+-----------+----------+--------------+  SFJ       Full                                                             +---------+---------------+---------+-----------+----------+--------------+  FV Prox   Full                                                             +---------+---------------+---------+-----------+----------+--------------+  FV Mid    Full                                                             +---------+---------------+---------+-----------+----------+--------------+  FV Distal Full                                                             +---------+---------------+---------+-----------+----------+--------------+  PFV       Full                                                              +---------+---------------+---------+-----------+----------+--------------+  POP       Full            Yes       Yes                                    +---------+---------------+---------+-----------+----------+--------------+  PTV       Full                                                             +---------+---------------+---------+-----------+----------+--------------+  PERO      Full                                                             +---------+---------------+---------+-----------+----------+--------------+   +---------+---------------+---------+-----------+----------+--------------+  LEFT      Compressibility Phasicity  Spontaneity Properties Thrombus Aging  +---------+---------------+---------+-----------+----------+--------------+  CFV       Full            Yes       Yes                                    +---------+---------------+---------+-----------+----------+--------------+  SFJ       Full                                                             +---------+---------------+---------+-----------+----------+--------------+  FV Prox   Full                                                             +---------+---------------+---------+-----------+----------+--------------+  FV Mid    Full                                                             +---------+---------------+---------+-----------+----------+--------------+  FV Distal Full                                                             +---------+---------------+---------+-----------+----------+--------------+  PFV       Full                                                             +---------+---------------+---------+-----------+----------+--------------+  POP       Full            Yes       Yes                                    +---------+---------------+---------+-----------+----------+--------------+  PTV       Full                                                              +---------+---------------+---------+-----------+----------+--------------+  PERO      Full                                                             +---------+---------------+---------+-----------+----------+--------------+  Summary: RIGHT: - There is no evidence of deep vein thrombosis in the lower extremity.  - No cystic structure found in the popliteal fossa.  LEFT: - There is no evidence of deep vein thrombosis in the lower extremity.  - No cystic structure found in the popliteal fossa.  *See table(s) above for measurements and observations. Electronically signed by Deitra Mayo MD on 10/27/2021 at 5:57:09 PM.    Final         Scheduled Meds:   stroke: mapping our early stages of recovery book   Does not apply Once   aspirin  81 mg Oral Daily   atorvastatin  40 mg Oral Daily   calcium-vitamin D  1 tablet Oral BID   clopidogrel  75 mg Oral Daily   enoxaparin (LOVENOX) injection  40 mg Subcutaneous Q24H   ferrous sulfate  325 mg Oral TID WC   megestrol  40 mg Oral BID   nicotine  21 mg Transdermal Daily   Vitamin D (Ergocalciferol)  50,000 Units Oral Q Wed   Continuous Infusions:  Assessment & Plan:   Principal Problem:   Acute ischemic stroke [facility])   Stroke: Left middle cerebral artery branch infarct likely secondary due to embolism of cryptogenic source Please see CT/MRI result EF nml EF. No shunt, neg. Bubble LE neg dvt Goal LDL <70 A1c 4.3 On dysphagia 1 diet Avoid hormonal therapy Asa 81 and plavix 75mg  x3 month followed by asa alone PT/OT for CIR evaluation Continue neurochecks Stat CT if neuro exam changes Permissive hypertension x24 hrs EEG negative Urine drug +marij. 1/5 TEE in am to r/o cardiac source of embolism Loop recorder for PAF F/u hypercoagulable /vasculitic labs Plan for cir   Anemia Due to menorrhagia.  Patient reports being on her period currently Monitor H&H Does have fibroids will need to follow-up GYN as outpatient 1/5 continue  iron    Marijuana use Cessation discussed.  Told me she does not smoke cigarrets    DVT prophylaxis: lovenox Code Status:full Family Communication: None at bedside Disposition Plan: CIR Status is: Inpatient  Remains inpatient appropriate because: IV treatment, w/u pending/TEE            LOS: 3 days   Time spent: 35 minutes with more than 50% on [place], MD [facility] Pager [phone]  If 7PM-7AM, please contact night-coverage 10/29/2021, 10:03 AM

## 2021-10-29 NOTE — Progress Notes (Signed)
STROKE TEAM PROGRESS NOTE   INTERVAL HISTORY Patient is seen in her room with her occupational therapist at. the bedside.  She today has shown significant improvement in her expressive aphasia and right hemiparesis.  TEE scheduled for tomorrow.  Vital signs are stable.  ANA is negative but anticardiolipin antibodies are not yet back Vitals:   10/29/21 0405 10/29/21 0756 10/29/21 1136 10/29/21 1149  BP: 130/83 118/70 (!) 128/97 131/81  Pulse: 96 (!) 103 100 (!) 104  Resp: 16 16 16 19   Temp: 97.7 F (36.5 C) 97.8 F (36.6 C) (!) 97.4 F (36.3 C) 97.9 F (36.6 C)  TempSrc: Oral Oral Oral Oral  SpO2: 100% 100% 100% 100%  Weight:      Height:       CBC:  Recent Labs  Lab 10/26/21 0940 10/26/21 0948 10/27/21 0326 10/28/21 0300  WBC 14.0*  --   --  10.9*  NEUTROABS 11.2*  --   --   --   HGB 7.2*   < > 7.5* 7.5*  HCT 24.2*   < > 24.5* 23.4*  MCV 85.5  --   --  82.1  PLT 457*  --   --  389   < > = values in this interval not displayed.   Basic Metabolic Panel:  Recent Labs  Lab 10/26/21 0940 10/26/21 0948  NA 138 138  K 4.0 3.9  CL 107 107  CO2 21*  --   GLUCOSE 118* 116*  BUN 15 15  CREATININE 0.64 0.60  CALCIUM 8.8*  --    Lipid Panel:  Recent Labs  Lab 10/27/21 0326  CHOL 145  TRIG 69  HDL 52  CHOLHDL 2.8  VLDL 14  LDLCALC 79   HgbA1c:  Recent Labs  Lab 10/27/21 0326  HGBA1C <4.2*   Urine Drug Screen:  Recent Labs  Lab 10/26/21 1239  LABOPIA NONE DETECTED  COCAINSCRNUR NONE DETECTED  LABBENZ NONE DETECTED  AMPHETMU NONE DETECTED  THCU POSITIVE*  LABBARB NONE DETECTED    Alcohol Level No results for input(s): ETH in the last 168 hours.  IMAGING past 24 hours No results found.  PHYSICAL EXAM General:  Alert, obese middle-aged African-American female in no acute distress    NEURO:  Mental Status: AA&Ox3  Speech/Language: speech is with only mild nonfluent speech and difficulty with some word finding difficulty..  Patient is  able to name  simple objects and has mildly impaired repetition.  Good comprehension  Cranial Nerves:  II: PERRL. Right partial peripheral visual field cut III, IV, VI: EOMI. Eyelids elevate symmetrically.  V: Sensation is intact to light touch and symmetrical to face.  VII: Smile is symmetrical. Able to puff cheeks and raise eyebrows.  VIII: hearing intact to voice. IX, X: Phonation is normal.  XII: right sided tongue deviation Motor: 5/5 strength to LUE and LLE, 0/5 to RUE, 3/5 hand RLE 4/5 strength. Sensation- Intact to light touch bilaterally. Extinction absent to light touch to DSS.   Coordination: FTN intact to left Gait- deferred   ASSESSMENT/PLAN Ms. Shaunna Rosetti is a 51 y.o. female with no significant past medical history presenting with aphasia and right sided weakness.  She was found to have multiple acute infarcts in the left MCA territory on MRI.  Stroke: Left middle cerebral artery branch infarct likely secondary due to embolism of cryptogenic source Code Stroke CT head No acute abnormality.  ASPECTS 10.    CTA head & neck No emergent LVO, ischemic range blood  flow in frontal lobe, no significant atheromatous disease in the neck MRI  multifocal acute/subacute infarcts in left MCA territory 2D Echo EF 60-65%, no interatrial shunt TCD bubble study negative for right-to-left shunt Lower extremity doppler no evidence of DVT LDL 79 HgbA1c 4.3 VTE prophylaxis - lovenox    Diet   DIET DYS 2 Room service appropriate? Yes with Assist; Fluid consistency: Thin   No antithrombotic prior to admission, now on aspirin 81 mg and Plavix 75 mg daily.  For 3 months followed by aspirin alone Therapy recommendations:  CIR Disposition:  pending  Hypertension Home meds:  none Stable Permissive hypertension (OK if < 220/120) but gradually normalize in 5-7 days Long-term BP goal normotensive  Hyperlipidemia Home meds:  none LDL 79, goal < 70 Add atorvastatin 40 mg daily  Continue statin at  discharge   Other Stroke Risk Factors Family hx stroke (mother, in her 28s)   Other Active Problems none  Hospital day # 3   Patient presented with expressive aphasia due to left MCA branch infarct likely of cryptogenic etiology.  She unfortunately presented outside candidate for thrombolysis and was not a candidate for mechanical thrombectomy.  Recommend TEE for cardiac source of embolism and loop recorder for paroxysmal A. fib and follow anticardiolipin antibody lab results..  Aspirin and Plavix for 3 weeks followed by aspirin along with aggressive risk factor modification.  Continue ongoing physical, occupational and speech therapy .Marland Kitchen  Patient counseled to quit marijuana and cigarettes and voiced agreement.  Transfer to inpatient rehab when bed available in the next few days.  Long discussion with patient  at the bedside and answered questions.  Greater than 50% time during this 25-minute visit was spent on counseling and coordination of care and discussion about stroke prevention and evaluation and treatment and answering questions.  Discussed with Dr. Susann Givens, MD Medical Director [place] Pager: [phone] 10/29/2021 1:14 PM   To contact Stroke Continuity provider, please refer to [email or web address] After hours, contact General Neurology

## 2021-10-29 NOTE — Progress Notes (Signed)
Occupational Therapy Treatment Patient Details Name: Megan Chaney MRN: [id number] DOB: 23-Sep-1971 Today's Date: 10/29/2021   History of present illness 51 y/o female admitted 1/2 after being found down at work with aphasia and R sided weakness. Pt found to have Multifocal acute/subacute nonhemorrhagic infarcts involving the L MCA territory. PMHx: chronic iron deficiency anemia secondary to fibroids and menorrhagia.   OT comments  Pt is making excellent progress. Able to ambulate to the bathroom with Min HHA with cues to not run into objects on the R (inattention vs visual field deficit). Completed ADL @ sit - stand level with mod A. Apparent sensory motor planning deficits noted during ADL tasks. RUE movement improving out of synergy pattern, however pt requires mod VC to incorporate into activities. Continue to recommend intensive rehab at [facility] facility. Pt very appreciative.   Of note, pt states her Mom dies of a stroke at 40 and theat her brother died of a "heart attack" several years ago. Pt appropriately emotional about her medical issues. 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    Recommendations for follow up therapy are one component of a multi-disciplinary discharge planning process, led by the attending physician.  Recommendations may be updated based on patient status, additional functional criteria and insurance authorization.    Follow Up Recommendations  Acute inpatient rehab (3hours/day)    Assistance Recommended at Discharge Intermittent Supervision/Assistance  Patient can return home with the following  A little help with bathing/dressing/bathroom;A little help with walking and/or transfers;Assistance with cooking/housework;Direct supervision/assist for medications management;Direct supervision/assist for financial management;Assist for transportation;Help with stairs or ramp for entrance   Equipment  Recommendations  BSC/3in1    Recommendations for Other Services Rehab consult    Precautions / Restrictions Precautions Precautions: Fall Precaution Comments: painful R UE       Mobility Bed Mobility   Bed Mobility: Supine to Sit     Supine to sit: Supervision;HOB elevated          Transfers Overall transfer level: Needs assistance     Sit to Stand: Min assist   Step pivot transfers: Min assist             Balance Overall balance assessment: Needs assistance   Sitting balance-Leahy Scale: Good       Standing balance-Leahy Scale: Poor                             ADL either performed or assessed with clinical judgement   ADL Overall ADL's : Needs assistance/impaired Eating/Feeding: Set up;Supervision/ safety   Grooming: Minimal assistance   Upper Body Bathing: Minimal assistance   Lower Body Bathing: Moderate assistance;Sit to/from stand   Upper Body Dressing : Moderate assistance;Sitting   Lower Body Dressing: Moderate assistance;Sit to/from stand   Toilet Transfer: Minimal assistance;Ambulation   Toileting- Clothing Manipulation and Hygiene: Moderate assistance       Functional mobility during ADLs: Minimal assistance General ADL Comments: Began education on hemi-dressing techniques    Extremity/Trunk Assessment Upper Extremity Assessment Upper Extremity Assessment: RUE deficits/detail RUE Deficits / Details: isolated movement present out of synergy pattern; hikes shoulder when attempting to lift arm; able to complete gross grasp/release; cues to use R hadn as functional assist RUE Sensation: decreased light touch;decreased proprioception RUE Coordination: decreased fine motor;decreased gross motor (unaware of R arm handing by her side at times)   Lower Extremity Assessment  Lower Extremity Assessment: Defer to PT evaluation        Vision   Additional Comments: Will continue to assess; not initially seeing toothbrush in R  field   Perception     Praxis Praxis Praxis: Impaired Praxis Impairment Details: Motor planning Praxis-Other Comments: Difficulty noted when brushing teeth. Pt found toothpaste after picking up @ 5 objects and reading them; not using shape of toothpaste tohelp decipher correct object. Once found, pt said "ah", then began to go through toothbrushing movement with toothpaste. Action stopped adn therapist pointed to toothbrush, pt then put toothpaste in her mouth then brushed her teeth    Cognition Arousal/Alertness: Awake/alert Behavior During Therapy: WFL for tasks assessed/performed Overall Cognitive Status: Impaired/Different from baseline Area of Impairment: Following commands;Awareness;Problem solving                       Following Commands: Follows one step commands with increased time   Awareness: Emergent Problem Solving: Difficulty sequencing General Comments: note apraxia during functional tasks; improves with repetition          Exercises Exercises: Other exercises Other Exercises Other Exercises: squeeze ball   Shoulder Instructions       General Comments      Pertinent Vitals/ Pain       Pain Assessment: Faces Faces Pain Scale: Hurts little more Pain Location: R UE/IV site Pain Descriptors / Indicators: Grimacing;Guarding Pain Intervention(s): Limited activity within patient's tolerance;Repositioned;Ice applied  Home Living                                          Prior Functioning/Environment              Frequency  Min 2X/week        Progress Toward Goals  OT Goals(current goals can now be found in the care plan section)  Progress towards OT goals: Progressing toward goals  Acute Rehab OT Goals Patient Stated Goal: to get better OT Goal Formulation: With patient Time For Goal Achievement: 11/10/21 Potential to Achieve Goals: Good ADL Goals Pt Will Perform Grooming: sitting;with set-up Pt Will Perform Upper  Body Bathing: with set-up;sitting Pt Will Perform Lower Body Bathing: with supervision;with set-up;sit to/from stand Pt Will Perform Upper Body Dressing: with set-up;sitting Pt Will Perform Lower Body Dressing: with supervision;sit to/from stand Pt Will Transfer to Toilet: with min guard assist;ambulating Pt Will Perform Toileting - Clothing Manipulation and hygiene: with set-up;with supervision;sit to/from stand Additional ADL Goal #1: Pt will use RUE as functional assist during ADL tasks  Plan Discharge plan remains appropriate    Co-evaluation                 AM-PAC OT "6 Clicks" Daily Activity     Outcome Measure   Help from another person eating meals?: A Little Help from another person taking care of personal grooming?: A Little Help from another person toileting, which includes using toliet, bedpan, or urinal?: A Lot Help from another person bathing (including washing, rinsing, drying)?: A Lot Help from another person to put on and taking off regular upper body clothing?: A Lot Help from another person to put on and taking off regular lower body clothing?: A Lot 6 Click Score: 14    End of Session Equipment Utilized During Treatment: Gait belt  OT Visit Diagnosis: Unsteadiness on feet (R26.81);Other abnormalities of gait and  mobility (R26.89);Muscle weakness (generalized) (M62.81);Apraxia (R48.2);Other symptoms and signs involving cognitive function;Other symptoms and signs involving the nervous system (R29.898);Cognitive communication deficit (R41.841);Hemiplegia and hemiparesis Symptoms and signs involving cognitive functions: Cerebral infarction Hemiplegia - Right/Left: Right Hemiplegia - dominant/non-dominant: Dominant Hemiplegia - caused by: Cerebral infarction   Activity Tolerance Patient tolerated treatment well   Patient Left in chair;with call bell/phone within reach;with chair alarm set   Nurse Communication Mobility status;Other (comment) (painful RUE; pt  wanting to shower; HR 138 Max)        Time: 1230-1310 OT Time Calculation (min): 40 min  Charges: OT General Charges $OT Visit: 1 Visit OT Treatments $Self Care/Home Management : 23-37 mins $Neuromuscular Re-education: 8-22 mins  Maurie Boettcher, OT/L   Acute OT Clinical Specialist [place] Pager [phone] Office [phone]   [facility] 10/29/2021, 1:46 PM

## 2021-10-29 NOTE — TOC Progression Note (Addendum)
Transition of Care [facility]) - Progression Note    Patient Details  Name: Megan Chaney MRN: [id number] Date of Birth: 27-Oct-1970  Transition [facility]) CM/SW Contact  Pollie Friar, RN Phone Number: 10/29/2021, 12:21 PM  Clinical Narrative:    Received message from [facility] that patient will have to be denied by [facility] prior to [place]. CM has called [facility] and left a voicemail. Pt is refusing IR at [facility]. TOC following.  2536: [facility] returned the call and they have no bed availability.  Expected Discharge Plan: IP Rehab Facility Barriers to Discharge: Continued Medical Work up  Expected Discharge Plan and Services Expected Discharge Plan: [place]   Discharge Planning Services: CM Consult Post Acute Care Choice: IP Rehab Living arrangements for the past 2 months: Single Family Home                                       Social Determinants of Health (SDOH) Interventions    Readmission Risk Interventions No flowsheet data found.

## 2021-10-29 NOTE — Progress Notes (Signed)
Physical Therapy Treatment Patient Details Name: Megan Chaney MRN: [id number] DOB: Aug 23, 1971 Today's Date: 10/29/2021   History of Present Illness 51 y/o female admitted 1/2 after being found down at work with aphasia and R sided weakness. Pt found to have Multifocal acute/subacute nonhemorrhagic infarcts involving the L MCA territory. PMHx: chronic iron deficiency anemia secondary to fibroids and menorrhagia.    PT Comments    Patient progressing this session to hallway ambulation and with much improved mobility for supine to sit and sit to stand.  She fatigues and has significant tachycardia and elevated BP.  She remains appropriate for acute inpatient rehab with continued R UE weakness, dysarthria and R LE weakness/imbalance/apraxia.  PT will continue to follow acutely.    Recommendations for follow up therapy are one component of a multi-disciplinary discharge planning process, led by the attending physician.  Recommendations may be updated based on patient status, additional functional criteria and insurance authorization.  Follow Up Recommendations  Acute inpatient rehab (3hours/day)     Assistance Recommended at Discharge    Patient can return home with the following A lot of help with walking and/or transfers;Help with stairs or ramp for entrance;A lot of help with bathing/dressing/bathroom;Assistance with cooking/housework;Direct supervision/assist for medications management;Assist for transportation;Direct supervision/assist for financial management   Equipment Recommendations  Cane    Recommendations for Other Services       Precautions / Restrictions Precautions Precautions: Fall Precaution Comments: painful R UE     Mobility  Bed Mobility Overal bed mobility: Needs Assistance Bed Mobility: Supine to Sit     Supine to sit: Supervision;HOB elevated Sit to supine: Supervision   General bed mobility comments: HOB elevated, but did not need to use rail     Transfers Overall transfer level: Needs assistance Equipment used: Rolling walker (2 wheels);None Transfers: Sit to/from Stand Sit to Stand: Min assist;Mod assist     Step pivot transfers: Min assist     General transfer comment: cues for anterior weight shift and using legs to stand, assist for balance with mild lifting help initially    Ambulation/Gait Ambulation/Gait assistance: Min assist Gait Distance (Feet): 120 Feet (x 2) Assistive device: Rolling walker (2 wheels);None Gait Pattern/deviations: Step-through pattern;Decreased stride length;Knee flexed in stance - right;Decreased dorsiflexion - right;Wide base of support       General Gait Details: initially assisted with RW due to R hand weakness/inattention so frequent cue to grip walker and assist to guide walker, then without device with assist for balance and safety due to R LE weakness   Stairs             Wheelchair Mobility    Modified Rankin (Stroke Patients Only) Modified Rankin (Stroke Patients Only) Pre-Morbid Rankin Score: No symptoms Modified Rankin: Moderately severe disability     Balance Overall balance assessment: Needs assistance   Sitting balance-Leahy Scale: Good Sitting balance - Comments: seated to don and doff robe, sig other present, initiated education on donning R UE first   Standing balance support: No upper extremity supported;Bilateral upper extremity supported Standing balance-Leahy Scale: Poor Standing balance comment: min A for balance without UE support                            Cognition Arousal/Alertness: Awake/alert Behavior During Therapy: WFL for tasks assessed/performed Overall Cognitive Status: Impaired/Different from baseline Area of Impairment: Following commands;Awareness;Problem solving  Following Commands: Follows one step commands consistently;Follows one step commands with increased time   Awareness:  Emergent Problem Solving: Slow processing;Difficulty sequencing General Comments: note apraxia during functional tasks; improves with repetition        Exercises Other Exercises Other Exercises: squeeze ball    General Comments General comments (skin integrity, edema, etc.): HR up to 140 with ambulation, down as low as 117 at rest, BP 131/100      Pertinent Vitals/Pain Pain Assessment: Faces Faces Pain Scale: Hurts little more Pain Location: R UE/IV site Pain Descriptors / Indicators: Grimacing;Guarding Pain Intervention(s): Monitored during session;Repositioned    Home Living                          Prior Function            PT Goals (current goals can now be found in the care plan section) Progress towards PT goals: Progressing toward goals    Frequency           PT Plan Current plan remains appropriate    Co-evaluation              AM-PAC PT "6 Clicks" Mobility   Outcome Measure  Help needed turning from your back to your side while in a flat bed without using bedrails?: A Little Help needed moving from lying on your back to sitting on the side of a flat bed without using bedrails?: A Little Help needed moving to and from a bed to a chair (including a wheelchair)?: A Little Help needed standing up from a chair using your arms (e.g., wheelchair or bedside chair)?: A Lot Help needed to walk in hospital room?: A Little Help needed climbing 3-5 steps with a railing? : Total 6 Click Score: 15    End of Session Equipment Utilized During Treatment: Gait belt Activity Tolerance: Treatment limited secondary to medical complications (Comment) (elevated HR) Patient left: in bed;with call bell/phone within reach;with family/visitor present   PT Visit Diagnosis: Other abnormalities of gait and mobility (R26.89);Muscle weakness (generalized) (M62.81)     Time: 3212-2482 PT Time Calculation (min) (ACUTE ONLY): 35 min  Charges:  $Gait Training:  23-37 mins                     Megan Chaney, PT Acute Rehabilitation Services Pager:[phone] Office:[phone] 10/29/2021    Reginia Naas 10/29/2021, 4:55 PM

## 2021-10-29 NOTE — Progress Notes (Signed)
° ° °  [facility] has been requested to perform a transesophageal echocardiogram on Megan Chaney for stroke.  After careful review of history and examination, the risks and benefits of transesophageal echocardiogram have been explained including risks of esophageal damage, perforation (1:10,000 risk), bleeding, pharyngeal hematoma as well as other potential complications associated with conscious sedation including aspiration, arrhythmia, respiratory failure and death. Alternatives to treatment were discussed, questions were answered. Patient is willing to proceed.   Pt is scheduled tomorrow at 1:30 pm. NPO at MN please.  Tami Lin Rino [place], [place]  10/29/2021 4:00 PM

## 2021-10-30 ENCOUNTER — Encounter (HOSPITAL_COMMUNITY)
Admission: EM | Disposition: A | Discharge status: Rehabilitation Facility | Payer: Self-pay | Source: Home / Self Care | Attending: Internal Medicine

## 2021-10-30 ENCOUNTER — Inpatient Hospital Stay (HOSPITAL_COMMUNITY): Payer: BLUE CROSS/BLUE SHIELD | Admitting: Registered Nurse

## 2021-10-30 ENCOUNTER — Encounter (HOSPITAL_COMMUNITY): Payer: Self-pay | Admitting: Internal Medicine

## 2021-10-30 ENCOUNTER — Inpatient Hospital Stay (HOSPITAL_COMMUNITY): Payer: BLUE CROSS/BLUE SHIELD

## 2021-10-30 DIAGNOSIS — Q2112 Patent foramen ovale: Secondary | ICD-10-CM

## 2021-10-30 DIAGNOSIS — I639 Cerebral infarction, unspecified: Secondary | ICD-10-CM | POA: Diagnosis not present

## 2021-10-30 HISTORY — PX: TEE WITHOUT CARDIOVERSION: SHX5443

## 2021-10-30 HISTORY — PX: LOOP RECORDER INSERTION: EP1214

## 2021-10-30 HISTORY — PX: BUBBLE STUDY: SHX6837

## 2021-10-30 LAB — CARDIOLIPIN ANTIBODIES, IGG, IGM, IGA
Anticardiolipin IgA: <9 APL U/mL (ref 0–11)
Anticardiolipin IgG: <9 GPL U/mL (ref 0–14)
Anticardiolipin IgM: <9 MPL U/mL (ref 0–12)

## 2021-10-30 SURGERY — ECHOCARDIOGRAM, TRANSESOPHAGEAL
Anesthesia: Monitor Anesthesia Care

## 2021-10-30 SURGERY — LOOP RECORDER INSERTION

## 2021-10-30 MED ORDER — LIDOCAINE-EPINEPHRINE 1 %-1:100000 IJ SOLN
INTRAMUSCULAR | Status: AC
  Filled 2021-10-30: qty 1

## 2021-10-30 MED ORDER — SODIUM CHLORIDE 0.9 % IV SOLN: INTRAVENOUS | Status: DC

## 2021-10-30 MED ORDER — PROPOFOL 500 MG/50ML IV EMUL
INTRAVENOUS | Status: DC | PRN
  Administered 2021-10-30: 75 ug/kg/min via INTRAVENOUS

## 2021-10-30 MED ORDER — LIDOCAINE-EPINEPHRINE 1 %-1:100000 IJ SOLN
INTRAMUSCULAR | Status: DC | PRN
  Administered 2021-10-30: 8 mL

## 2021-10-30 MED ORDER — RISAQUAD PO CAPS
1.0000 | ORAL_CAPSULE | Freq: Every day | ORAL | Status: DC
  Administered 2021-10-31 – 2021-11-02 (×3): 1 via ORAL
  Filled 2021-10-30 (×5): qty 1

## 2021-10-30 MED ORDER — CEPHALEXIN 500 MG PO CAPS
500.0000 mg | ORAL_CAPSULE | Freq: Three times a day (TID) | ORAL | Status: DC
  Administered 2021-10-30 – 2021-11-03 (×11): 500 mg via ORAL
  Filled 2021-10-30 (×12): qty 1

## 2021-10-30 MED ORDER — PROPOFOL 10 MG/ML IV BOLUS
INTRAVENOUS | Status: DC | PRN
  Administered 2021-10-30 (×2): 30 mg via INTRAVENOUS

## 2021-10-30 MED ORDER — LIDOCAINE 2% (20 MG/ML) 5 ML SYRINGE
INTRAMUSCULAR | Status: DC | PRN
  Administered 2021-10-30: 100 mg via INTRAVENOUS

## 2021-10-30 SURGICAL SUPPLY — 2 items
PACK LOOP INSERTION (CUSTOM PROCEDURE TRAY) ×2 IMPLANT
SYSTEM MONITOR REVEAL LINQ II (Prosthesis & Implant Heart) ×1 IMPLANT

## 2021-10-30 NOTE — Progress Notes (Signed)
PROGRESS NOTE    Megan Chaney  QBH:[id number] DOB: 22-Sep-1971 DOA: 10/26/2021 PCP: Michael Boston, MD    Brief Narrative:  : Megan Chaney is a 51 y.o. female with medical history significant of chronic iron deficiency anemia secondary to fibroids and menorrhagia on hormone therapy, cigar smoker, GERD, presented with sudden onset of right-sided weakness and aphagia  1/3Neurology was consulted.  Pt family at bedside reports she is little better with speech, but overall still with expressive aphagia. Tries to get her words out  1/4 able to get more words out today compared to yesterday.She reports being on her menstrual cycle currently  1/5 able to get more words out today and lift rue more. TEE tomorrow 1/6 TEE today.  Complaining of right upper extremity swelling, erythema and pain where IV site was .  Consultants:  Neurology, cardiology  Procedures:   Antimicrobials:      Subjective: Getting more words out daily, speech is improving.  Able to lift her arm halfway to air which is more than previous days.  She is excited about her progression and is trying very hard to do better.  Objective: Vitals:   10/29/21 2352 10/30/21 0347 10/30/21 0808 10/30/21 1113  BP: 117/75 101/74 118/81 127/89  Pulse: 95 96 (!) 107 (!) 106  Resp: 18 17 14 16   Temp: 98 F (36.7 C) 98 F (36.7 C) 98.6 F (37 C) 98.9 F (37.2 C)  TempSrc: Oral Oral Oral Oral  SpO2: 100% 100% 100% 100%  Weight:      Height:       No intake or output data in the 24 hours ending 10/30/21 1127  Filed Weights   10/26/21 0900  Weight: 73.4 kg    Examination: NAD, smiling, calm CTA no wheezing Regular S1-S2 no gallops Soft benign positive bowel sounds No edema Still having difficulty getting words out but definitely has improved since previous days, able to lift arm much higher today.  Hand squeeze strength is still 0 out of 5.  Lower extremity bilaterally 5 out of 5, left upper extremity 4 out of 5 RUE   with mild erythema, swelling, TTP. Awake and alert   Data Reviewed: I have personally reviewed following labs and imaging studies  CBC: Recent Labs  Lab 10/26/21 0940 10/26/21 0948 10/27/21 0326 10/28/21 0300 10/29/21 1628  WBC 14.0*  --   --  10.9*  --   NEUTROABS 11.2*  --   --   --   --   HGB 7.2* 8.2* 7.5* 7.5* 8.5*  HCT 24.2* 24.0* 24.5* 23.4* 26.8*  MCV 85.5  --   --  82.1  --   PLT 457*  --   --  389  --    Basic Metabolic Panel: Recent Labs  Lab 10/26/21 0940 10/26/21 0948  NA 138 138  K 4.0 3.9  CL 107 107  CO2 21*  --   GLUCOSE 118* 116*  BUN 15 15  CREATININE 0.64 0.60  CALCIUM 8.8*  --    GFR: Estimated Creatinine Clearance: 82.5 mL/min (by C-G formula based on SCr of 0.6 mg/dL). Liver Function Tests: Recent Labs  Lab 10/26/21 0940  AST 28  ALT 25  ALKPHOS 57  BILITOT 0.4  PROT 6.5  ALBUMIN 3.7   No results for input(s): LIPASE, AMYLASE in the last 168 hours. No results for input(s): AMMONIA in the last 168 hours. Coagulation Profile: Recent Labs  Lab 10/26/21 0940  INR 0.9   Cardiac Enzymes:  No results for input(s): CKTOTAL, CKMB, CKMBINDEX, TROPONINI in the last 168 hours. BNP (last 3 results) No results for input(s): PROBNP in the last 8760 hours. HbA1C: No results for input(s): HGBA1C in the last 72 hours.  CBG: Recent Labs  Lab 10/26/21 0938  GLUCAP 103*   Lipid Profile: No results for input(s): CHOL, HDL, LDLCALC, TRIG, CHOLHDL, LDLDIRECT in the last 72 hours.  Thyroid Function Tests: No results for input(s): TSH, T4TOTAL, FREET4, T3FREE, THYROIDAB in the last 72 hours.  Anemia Panel: No results for input(s): VITAMINB12, FOLATE, FERRITIN, TIBC, IRON, RETICCTPCT in the last 72 hours.  Sepsis Labs: No results for input(s): PROCALCITON, LATICACIDVEN in the last 168 hours.  Recent Results (from the past 240 hour(s))  Resp Panel by RT-PCR (Flu A&B, Covid) Nasopharyngeal Swab     Status: None   Collection Time: 10/27/21   3:55 PM   Specimen: Nasopharyngeal Swab; Nasopharyngeal(NP) swabs in vial transport medium  Result Value Ref Range Status   SARS Coronavirus 2 by RT PCR NEGATIVE NEGATIVE Final    Comment: (NOTE) SARS-CoV-2 target nucleic acids are NOT DETECTED.  The SARS-CoV-2 RNA is generally detectable in upper respiratory specimens during the acute phase of infection. The lowest concentration of SARS-CoV-2 viral copies this assay can detect is 138 copies/mL. A negative result does not preclude SARS-Cov-2 infection and should not be used as the sole basis for treatment or other patient management decisions. A negative result may occur with  improper specimen collection/handling, submission of specimen other than nasopharyngeal swab, presence of viral mutation(s) within the areas targeted by this assay, and inadequate number of viral copies(<138 copies/mL). A negative result must be combined with clinical observations, patient history, and epidemiological information. The expected result is Negative.  Fact Sheet for Patients:  EntrepreneurPulse.com.au  Fact Sheet for Healthcare Providers:  IncredibleEmployment.be  This test is no t yet approved or cleared by the [place] FDA and  has been authorized for detection and/or diagnosis of SARS-CoV-2 by FDA under an Emergency Use Authorization (EUA). This EUA will remain  in effect (meaning this test can be used) for the duration of the COVID-19 declaration under Section 564(b)(1) of the Act, 21 U.S.C.section 360bbb-3(b)(1), unless the authorization is terminated  or revoked sooner.       Influenza A by PCR NEGATIVE NEGATIVE Final   Influenza B by PCR NEGATIVE NEGATIVE Final    Comment: (NOTE) The Xpert Xpress SARS-CoV-2/FLU/RSV plus assay is intended as an aid in the diagnosis of influenza from Nasopharyngeal swab specimens and should not be used as a sole basis for treatment. Nasal washings and aspirates  are unacceptable for Xpert Xpress SARS-CoV-2/FLU/RSV testing.  Fact Sheet for Patients: EntrepreneurPulse.com.au  Fact Sheet for Healthcare Providers: IncredibleEmployment.be  This test is not yet approved or cleared by the [place] FDA and has been authorized for detection and/or diagnosis of SARS-CoV-2 by FDA under an Emergency Use Authorization (EUA). This EUA will remain in effect (meaning this test can be used) for the duration of the COVID-19 declaration under Section 564(b)(1) of the Act, 21 U.S.C. section 360bbb-3(b)(1), unless the authorization is terminated or revoked.  Performed at [facility], [place] [address] [place], [place] [address]          Radiology Studies: No results found.      Scheduled Meds:   stroke: mapping our early stages of recovery book   Does not apply Once   aspirin  81 mg Oral Daily   atorvastatin  40 mg Oral Daily   calcium-vitamin D  1 tablet Oral BID   clopidogrel  75 mg Oral Daily   enoxaparin (LOVENOX) injection  40 mg Subcutaneous Q24H   ferrous sulfate  325 mg Oral TID WC   megestrol  40 mg Oral BID   nicotine  21 mg Transdermal Daily   Vitamin D (Ergocalciferol)  50,000 Units Oral Q Wed   Continuous Infusions:  sodium chloride      Assessment & Plan:   Principal Problem:   Acute ischemic stroke [facility])   Stroke: Left middle cerebral artery branch infarct likely secondary due to embolism of cryptogenic source Please see CT/MRI result EF nml EF. No shunt, neg. Bubble LE neg dvt Goal LDL <70 A1c 4.3 On dysphagia 1 diet Avoid hormonal therapy Asa 81 and plavix 75mg  x3 month followed by asa alone PT/OT for CIR evaluation Continue neurochecks Stat CT if neuro exam changes Permissive hypertension x24 hrs EEG negative Urine drug +marij. 1/6 TEE today to rule out cardiac source of embolism  30-day event monitor versus implantable loop recorder was discussed with the  patient by cardiology today  Plan to go to CIR   Right Upper extre cellulitis Location where IV was placed previously With erythema swelling and pain We will start Keflex 500 mg p.o. 3 times daily Obtain venous ultrasound of upper extremity rule out DVT Keep arms elevated Apply warm compresses   Anemia Due to menorrhagia.  Patient reports being on her period currently Does have fibroids will need to follow-up GYN as outpatient 1/6 H&H stable   Marijuana use Told me she does not smoke cigarrets 1/6 marijuana cessation discussed with patient    DVT prophylaxis: lovenox Code Status:full Family Communication: None at bedside Disposition Plan: CIR Status is: Inpatient  Remains inpatient appropriate because: IV treatment, w/u pending/TEE            LOS: 4 days   Time spent: 45 minutes with more than 50% on Coleman, MD [facility] Pager [phone]  If 7PM-7AM, please contact night-coverage 10/30/2021, 11:27 AM

## 2021-10-30 NOTE — Interval H&P Note (Signed)
History and Physical Interval Note:  10/30/2021 1:24 PM  Megan Chaney  has presented today for surgery, with the diagnosis of STROKE.  The various methods of treatment have been discussed with the patient and family. After consideration of risks, benefits and other options for treatment, the patient has consented to  Procedure(s): TRANSESOPHAGEAL ECHOCARDIOGRAM (TEE) (N/A) as a surgical intervention.  The patient's history has been reviewed, patient examined, no change in status, stable for surgery.  I have reviewed the patient's chart and labs.  Questions were answered to the patient's satisfaction.     Jenkins Rouge

## 2021-10-30 NOTE — Transfer of Care (Signed)
Immediate Anesthesia Transfer of Care Note  Patient: Megan Chaney  Procedure(s) Performed: TRANSESOPHAGEAL ECHOCARDIOGRAM (TEE) BUBBLE STUDY  Patient Location: PACU and Endoscopy Unit  Anesthesia Type:MAC  Level of Consciousness: awake, alert  and oriented  Airway & Oxygen Therapy: Patient Spontanous Breathing and Patient connected to nasal cannula oxygen  Post-op Assessment: Report given to RN and Post -op Vital signs reviewed and stable  Post vital signs: Reviewed and stable  Last Vitals:  Vitals Value Taken Time  BP 126/71 10/30/21 1354  Temp    Pulse 117 10/30/21 1354  Resp 24 10/30/21 1354  SpO2 99 % 10/30/21 1354  Vitals shown include unvalidated device data.  Last Pain:  Vitals:   10/30/21 1300  TempSrc: Oral  PainSc:          Complications: No notable events documented.

## 2021-10-30 NOTE — H&P (View-Only) (Signed)
Physical Therapy Treatment Patient Details Name: Megan Chaney MRN: [id number] DOB: 26-Jul-1971 Today's Date: 10/30/2021   History of Present Illness 51 y/o female admitted 1/2 after being found down at work with aphasia and R sided weakness. Pt found to have Multifocal acute/subacute nonhemorrhagic infarcts involving the L MCA territory. PMHx: chronic iron deficiency anemia secondary to fibroids and menorrhagia.    PT Comments    Patient progressing this session with ambulation distance, endurance and initiating stair training.  She remains tachycardic with HR to 141 with ambulation and stair training this session needing frequent rest breaks and fatigue.  Noted R hip weakness affecting gait as well knee and ankle weakness and discussed continuing treatment to work on it, but pt too fatigued.  Feel she will benefit from acute inpatient rehab at d/c.  Will follow until d/c.   Recommendations for follow up therapy are one component of a multi-disciplinary discharge planning process, led by the attending physician.  Recommendations may be updated based on patient status, additional functional criteria and insurance authorization.  Follow Up Recommendations  Acute inpatient rehab (3hours/day)     Assistance Recommended at Discharge Frequent or constant Supervision/Assistance  Patient can return home with the following A lot of help with walking and/or transfers;Help with stairs or ramp for entrance;A lot of help with bathing/dressing/bathroom;Assistance with cooking/housework;Direct supervision/assist for medications management;Assist for transportation;Direct supervision/assist for financial management   Equipment Recommendations  Cane    Recommendations for Other Services       Precautions / Restrictions Precautions Precautions: Fall     Mobility  Bed Mobility Overal bed mobility: Needs Assistance       Supine to sit: Supervision;HOB elevated Sit to supine: Supervision         Transfers Overall transfer level: Needs assistance Equipment used: None Transfers: Sit to/from Stand Sit to Stand: Min guard           General transfer comment: assist for balance/safety, increased time to rise    Ambulation/Gait Ambulation/Gait assistance: Min assist Gait Distance (Feet): 140 Feet (x 2) Assistive device: None Gait Pattern/deviations: Step-through pattern;Knee flexed in stance - right;Decreased stance time - right;Decreased dorsiflexion - left       General Gait Details: increased lateral weight shift to R with hip sway, min A for balance, safety and cues for L UE relaxing at side since keeps it up in high guard due to balance and R LE weakness   Stairs Stairs: Yes Stairs assistance: Min assist Stair Management: One rail Left;Step to pattern;Alternating pattern;Forwards;Two rails Number of Stairs: 2 General stair comments: educated in technique with step to pattern, pt with self selected pattern, however, and one step with step through pattern without LOB using rail on L to ascend and both rails to descend with help to get R UE on to rail   Wheelchair Mobility    Modified Rankin (Stroke Patients Only) Modified Rankin (Stroke Patients Only) Pre-Morbid Rankin Score: No symptoms Modified Rankin: Moderately severe disability     Balance Overall balance assessment: Needs assistance Sitting-balance support: Feet supported Sitting balance-Leahy Scale: Good     Standing balance support: Single extremity supported;No upper extremity supported Standing balance-Leahy Scale: Poor Standing balance comment: min A for balance without UE support               High Level Balance Comments: standing step taps to first step with and without UE support with min to mod A  Cognition Arousal/Alertness: Awake/alert Behavior During Therapy: WFL for tasks assessed/performed Overall Cognitive Status: Impaired/Different from baseline Area of  Impairment: Following commands;Awareness;Problem solving                       Following Commands: Follows one step commands consistently;Follows one step commands with increased time   Awareness: Emergent Problem Solving: Slow processing          Exercises      General Comments General comments (skin integrity, edema, etc.): HR up o 141 with ambulation and stairs,  Several seated rest breaks between walks and stairs, etc      Pertinent Vitals/Pain Faces Pain Scale: Hurts a little bit Pain Location: R UE/IV site Pain Descriptors / Indicators: Sore Pain Intervention(s): Monitored during session    Home Living                          Prior Function            PT Goals (current goals can now be found in the care plan section) Progress towards PT goals: Progressing toward goals    Frequency    Min 4X/week      PT Plan Current plan remains appropriate    Co-evaluation              AM-PAC PT "6 Clicks" Mobility   Outcome Measure  Help needed turning from your back to your side while in a flat bed without using bedrails?: None Help needed moving from lying on your back to sitting on the side of a flat bed without using bedrails?: A Little Help needed moving to and from a bed to a chair (including a wheelchair)?: A Little Help needed standing up from a chair using your arms (e.g., wheelchair or bedside chair)?: A Lot Help needed to walk in hospital room?: A Little Help needed climbing 3-5 steps with a railing? : A Lot 6 Click Score: 17    End of Session Equipment Utilized During Treatment: Gait belt Activity Tolerance: Patient limited by fatigue;Treatment limited secondary to medical complications (Comment) (increased HR) Patient left: with call bell/phone within reach;with bed alarm set;in bed   PT Visit Diagnosis: Other abnormalities of gait and mobility (R26.89);Muscle weakness (generalized) (M62.81)     Time: 5449-2010 PT Time  Calculation (min) (ACUTE ONLY): 26 min  Charges:  $Gait Training: 23-37 mins                     Magda Kiel, PT Acute Rehabilitation Services OFHQR:[phone] [phone] 10/30/2021    Reginia Naas 10/30/2021, 1:11 PM

## 2021-10-30 NOTE — Anesthesia Preprocedure Evaluation (Signed)
Anesthesia Evaluation  Patient identified by MRN, date of birth, ID band Patient awake    Reviewed: Allergy & Precautions, NPO status , Patient's Chart, lab work & pertinent test results  History of Anesthesia Complications Negative for: history of anesthetic complications  Airway Mallampati: II  TM Distance: >3 FB Neck ROM: Full    Dental  (+) Teeth Intact   Pulmonary neg pulmonary ROS, former smoker,    Pulmonary exam normal        Cardiovascular negative cardio ROS Normal cardiovascular exam     Neuro/Psych CVA    GI/Hepatic Neg liver ROS, GERD  ,  Endo/Other  negative endocrine ROS  Renal/GU negative Renal ROS  negative genitourinary   Musculoskeletal negative musculoskeletal ROS (+)   Abdominal   Peds  Hematology  (+) anemia ,   Anesthesia Other Findings   Reproductive/Obstetrics Uterine fibroids                            Anesthesia Physical Anesthesia Plan  ASA: 4  Anesthesia Plan: MAC   Post-op Pain Management: Minimal or no pain anticipated   Induction: Intravenous  PONV Risk Score and Plan: 2 and Propofol infusion, TIVA and Treatment may vary due to age or medical condition  Airway Management Planned: Natural Airway, Nasal Cannula and Simple Face Mask  Additional Equipment: None  Intra-op Plan:   Post-operative Plan:   Informed Consent: I have reviewed the patients History and Physical, chart, labs and discussed the procedure including the risks, benefits and alternatives for the proposed anesthesia with the patient or authorized representative who has indicated his/her understanding and acceptance.       Plan Discussed with:   Anesthesia Plan Comments:         Anesthesia Quick Evaluation

## 2021-10-30 NOTE — Discharge Instructions (Addendum)
Post procedure wound care instructions (heart monitor) Keep incision clean and dry for 3 days.  You can remove outer plastic dressing tomorrow. Leave steri-strips (little pieces of tape) on until seen in the office for wound check appointment. Call the office [phone]) for redness, drainage, swelling, or fever.   Information on my medicine - ELIQUIS (apixaban)  This medication education was reviewed with me or my healthcare representative as part of my discharge preparation.  Why was Eliquis prescribed for you? Eliquis was prescribed to treat blood clots that may have been found in the veins of your legs (deep vein thrombosis) or in your lungs (pulmonary embolism) and to reduce the risk of them occurring again.  What do You need to know about Eliquis ? The dose is ONE 5 mg tablet taken TWICE daily.  Eliquis may be taken with or without food.   Try to take the dose about the same time in the morning and in the evening. If you have difficulty swallowing the tablet whole please discuss with your pharmacist how to take the medication safely.  Take Eliquis exactly as prescribed and DO NOT stop taking Eliquis without talking to the doctor who prescribed the medication.  Stopping may increase your risk of developing a new blood clot.  Refill your prescription before you run out.  After discharge, you should have regular check-up appointments with your healthcare provider that is prescribing your Eliquis.    What do you do if you miss a dose? If a dose of ELIQUIS is not taken at the scheduled time, take it as soon as possible on the same day and twice-daily administration should be resumed. The dose should not be doubled to make up for a missed dose.  Important Safety Information A possible side effect of Eliquis is bleeding. You should call your healthcare provider right away if you experience any of the following: Bleeding from an injury or your nose that does not stop. Unusual  colored urine (red or dark brown) or unusual colored stools (red or black). Unusual bruising for unknown reasons. A serious fall or if you hit your head (even if there is no bleeding).  Some medicines may interact with Eliquis and might increase your risk of bleeding or clotting while on Eliquis. To help avoid this, consult your healthcare provider or pharmacist prior to using any new prescription or non-prescription medications, including herbals, vitamins, non-steroidal anti-inflammatory drugs (NSAIDs) and supplements.  This website has more information on Eliquis (apixaban): http://www.eliquis.com/eliquis/home

## 2021-10-30 NOTE — Consult Note (Addendum)
ELECTROPHYSIOLOGY CONSULT NOTE  Patient ID: Sherria Riemann MRN: [id number], DOB/AGE: 06/28/1971   Admit date: 10/26/2021 Date of Consult: 10/30/2021  Primary Physician: Michael Boston, MD Primary Cardiologist:  none Reason for Consultation: Cryptogenic stroke; recommendations regarding Implantable Loop Recorder, requested by Dr. Leonie Man  History of Present Illness Araya Roel was admitted on 10/26/2021 with expressive aphasia and L hemiparesis, found with stroke.    PMHx includes: none noted  Neurology notes: Left middle cerebral artery branch infarct likely secondary due to embolism of cryptogenic source.  she has undergone workup for stroke including echocardiogram and carotid angio.  The patient has been monitored on telemetry which has demonstrated sinus rhythm with no arrhythmias.  Inpatient stroke work-up is to be completed with a TEE.   Echocardiogram this admission demonstrated    IMPRESSIONS   1. Left ventricular ejection fraction, by estimation, is 60 to 65%. The  left ventricle has normal function. The left ventricle has no regional  wall motion abnormalities. Left ventricular diastolic parameters were  normal.   2. Right ventricular systolic function is normal. The right ventricular  size is normal.   3. The mitral valve is normal in structure. No evidence of mitral valve  regurgitation. No evidence of mitral stenosis.   4. The aortic valve is tricuspid. Aortic valve regurgitation is not  visualized. No aortic stenosis is present.   5. The inferior vena cava is normal in size with greater than 50%  respiratory variability, suggesting right atrial pressure of 3 mmHg.   6. Agitated saline contrast bubble study was negative, with no evidence  of any interatrial shunt.    Lab work is reviewed. ANEMIA, deferred to her IM team   Prior to admission, the patient denies chest pain, shortness of breath, dizziness, palpitations, or syncope.  They are recovering from their  stroke with plans to rehab at discharge.      Past Medical History:  Diagnosis Date   Anemia    Fibroid    GERD (gastroesophageal reflux disease)      Surgical History:  Past Surgical History:  Procedure Laterality Date   MULTIPLE TOOTH EXTRACTIONS     NO PAST SURGERIES       Medications Prior to Admission  Medication Sig Dispense Refill Last Dose   calcium-vitamin D (OSCAL 500/200 D-3) 500-200 MG-UNIT tablet Take 1 tablet by mouth 2 (two) times daily. 180 tablet 3    cefdinir (OMNICEF) 300 MG capsule Take 1 capsule (300 mg total) by mouth 2 (two) times daily. 14 capsule 0    Cholecalciferol (VITAMIN D3) 1.25 MG (50000 UT) CAPS Take 50,000 Units by mouth once a week.      ferrous sulfate 325 (65 FE) MG tablet TAKE 1 TABLET (325 MG TOTAL) BY MOUTH 3 (THREE) TIMES DAILY WITH MEALS. 270 tablet 1    medroxyPROGESTERone (PROVERA) 5 MG tablet Take 5 mg by mouth daily.      megestrol (MEGACE) 40 MG tablet TAKE 1 TABLET BY MOUTH TWICE A DAY (Patient taking differently: Take 40 mg by mouth 2 (two) times daily.) 180 tablet 1    naproxen (NAPROSYN) 500 MG tablet TAKE 1 TABLET (500 MG TOTAL) BY MOUTH 2 (TWO) TIMES DAILY WITH A MEAL. 60 tablet 1    promethazine-dextromethorphan (PROMETHAZINE-DM) 6.25-15 MG/5ML syrup Take 5 mLs by mouth at bedtime as needed for cough. 118 mL 0    Vitamin D, Ergocalciferol, (DRISDOL) 1.25 MG (50000 UT) CAPS capsule Take 1 capsule (50,000 Units total) by  mouth every 7 (seven) days. 8 capsule 0     Inpatient Medications:    stroke: mapping our early stages of recovery book   Does not apply Once   aspirin  81 mg Oral Daily   atorvastatin  40 mg Oral Daily   calcium-vitamin D  1 tablet Oral BID   clopidogrel  75 mg Oral Daily   enoxaparin (LOVENOX) injection  40 mg Subcutaneous Q24H   ferrous sulfate  325 mg Oral TID WC   megestrol  40 mg Oral BID   nicotine  21 mg Transdermal Daily   Vitamin D (Ergocalciferol)  50,000 Units Oral Q Wed    Allergies:   Allergies  Allergen Reactions   Latex Hives and Rash   Other Hives and Rash    Powder in latex gloves    Social History   Socioeconomic History   Marital status: Single    Spouse name: Not on file   Number of children: Not on file   Years of education: Not on file   Highest education level: Not on file  Occupational History   Not on file  Tobacco Use   Smoking status: Former    Types: Cigarettes    Quit date: 07/29/1999    Years since quitting: 22.2   Smokeless tobacco: Never  Vaping Use   Vaping Use: Never used  Substance and Sexual Activity   Alcohol use: Yes   Drug use: Not Currently    Types: Marijuana    Comment: Quit in July 2019   Sexual activity: Yes    Birth control/protection: None  Other Topics Concern   Not on file  Social History Narrative   Not on file   Social Determinants of Health   Financial Resource Strain: Not on file  Food Insecurity: Not on file  Transportation Needs: Not on file  Physical Activity: Not on file  Stress: Not on file  Social Connections: Not on file  Intimate Partner Violence: Not on file     Family History  Problem Relation Age of Onset   Chronic Renal Failure Father    Miscarriages / Stillbirths Father    Heart attack Mother    Stroke Mother    Cancer Mother    Diabetes Mother       Review of Systems: All other systems reviewed and are otherwise negative except as noted above.  Physical Exam: Vitals:   10/29/21 2352 10/30/21 0347 10/30/21 0808 10/30/21 1113  BP: 117/75 101/74 118/81 127/89  Pulse: 95 96 (!) 107 (!) 106  Resp: 18 17 14 16   Temp: 98 F (36.7 C) 98 F (36.7 C) 98.6 F (37 C) 98.9 F (37.2 C)  TempSrc: Oral Oral Oral Oral  SpO2: 100% 100% 100% 100%  Weight:      Height:        GEN- The patient is well appearing, alert and oriented x 3 today.   Head- normocephalic, atraumatic Eyes-  Sclera clear, conjunctiva pink Ears- hearing intact Oropharynx- clear Neck- supple Lungs- CTA b/l,  normal work of breathing Heart- RRR, no murmurs, rubs or gallops  GI- soft, NT, ND Extremities- no clubbing, cyanosis, or edema MS- no significant deformity or atrophy Skin- no rash or lesion Psych- euthymic mood, full affect   Labs:   Lab Results  Component Value Date   WBC 10.9 (H) 10/28/2021   HGB 8.5 (L) 10/29/2021   HCT 26.8 (L) 10/29/2021   MCV 82.1 10/28/2021   PLT 389 10/28/2021  Recent Labs  Lab 10/26/21 0940 10/26/21 0948  NA 138 138  K 4.0 3.9  CL 107 107  CO2 21*  --   BUN 15 15  CREATININE 0.64 0.60  CALCIUM 8.8*  --   PROT 6.5  --   BILITOT 0.4  --   ALKPHOS 57  --   ALT 25  --   AST 28  --   GLUCOSE 118* 116*   No results found for: CKTOTAL, CKMB, CKMBINDEX, TROPONINI Lab Results  Component Value Date   CHOL 145 10/27/2021   CHOL 157 10/04/2019   Lab Results  Component Value Date   HDL 52 10/27/2021   HDL 56 10/04/2019   Lab Results  Component Value Date   LDLCALC 79 10/27/2021   LDLCALC 90 10/04/2019   Lab Results  Component Value Date   TRIG 69 10/27/2021   TRIG 51 10/04/2019   Lab Results  Component Value Date   CHOLHDL 2.8 10/27/2021   CHOLHDL 2.8 10/04/2019   Lab Results  Component Value Date   LDLDIRECT 86.3 10/26/2021    No results found for: DDIMER   Radiology/Studies:  MR BRAIN WO CONTRAST Result Date: 10/26/2021 CLINICAL DATA:  Neuro deficit, acute, stroke suspected. Right-sided weakness. EXAM: MRI HEAD WITHOUT CONTRAST TECHNIQUE: Multiplanar, multiecho pulse sequences of the brain and surrounding structures were obtained without intravenous contrast. COMPARISON:  CT head without contrast. CT perfusion. CTA head and neck 10/26/2021. FINDINGS: Brain: Multifocal areas restricted diffusion are present in the left MCA territory, predominantly inferior and posteriorly. There is some involvement of the left middle temporal gyrus. Cortical involvement is present in the posterior left parietal lobe as well as the more superior  left parietal lobe. Areas of cortical infarction are present in the anterior left frontal lobe involving the middle and inferior frontal cortex. No acute hemorrhage present. The study is moderately degraded by patient motion. Right hemisphere is within normal limits. The ventricles are of normal size. No significant extraaxial fluid collection is present. The internal auditory canals are within normal limits. The brainstem and cerebellum are within normal limits. Vascular: Flow is present in the major intracranial arteries. Skull and upper cervical spine: The craniocervical junction is normal. Upper cervical spine is within normal limits. Marrow signal is unremarkable. Sinuses/Orbits: The paranasal sinuses and mastoid air cells are clear. The globes and orbits are within normal limits. IMPRESSION: 1. Multifocal acute/subacute nonhemorrhagic infarcts involving the left MCA territory. Findings are predominantly inferior and posteriorly. There is cortical infarction also involves the more anterior middle and inferior frontal lobes. 2. No acute hemorrhage. These results were called by telephone at the time of interpretation on 10/26/2021 at 2:52 pm to provider [facility] , who verbally acknowledged these results. Electronically Signed   By: San Morelle M.D.   On: 10/26/2021 14:54   CT C-SPINE NO CHARGE Result Date: 10/26/2021 CLINICAL DATA:  Trauma, found down EXAM: CT CERVICAL SPINE WITHOUT CONTRAST TECHNIQUE: Multidetector CT imaging of the cervical spine was performed without intravenous contrast. Multiplanar CT image reconstructions were also generated. COMPARISON:  None. FINDINGS: Alignment: Alignment of posterior margins of vertebral bodies is unremarkable. Degenerative changes are noted with bony spurs at C4-C5 and C5-C6 levels. Skull base and vertebrae: No recent fracture is seen. Soft tissues and spinal canal: There is no significant spinal stenosis. Prevertebral soft tissues are unremarkable. Disc  levels: There is mild encroachment of neural foramina at C4-C5 and C5-C6 levels anterior bony spurs seen in the upper thoracic spine.  Upper chest: Unremarkable. Other: Thyroid is enlarged in size. IMPRESSION: No recent fracture is seen in the cervical spine. Alignment of posterior margins of vertebral bodies is unremarkable. Cervical spondylosis with mild encroachment of neural foramina at C4-C5 and C5-C6 levels. Electronically Signed   By: Elmer Picker M.D.   On: 10/26/2021 11:19   EEG adult Result Date: 10/27/2021 Lora Havens, MD     10/27/2021  7:42 AM Patient Name: Lucynda Rosano MRN: [id number] Epilepsy Attending: Lora Havens Referring Physician/Provider: Dr Su Monks Date 10/26/2021 Duration: 21.09 mins Patient history: 51 year old woman with a past medical his history significant for fibroids who presented as a pretty activated stroke code with EMS for aphasia and right-sided weakness c/f acute ischemic stroke. EEG to evaluate for seizure Level of alertness: Awake AEDs during EEG study: None Technical aspects: This EEG study was done with scalp electrodes positioned according to the 10-20 International system of electrode placement. Electrical activity was acquired at a sampling rate of 500Hz  and reviewed with a high frequency filter of 70Hz  and a low frequency filter of 1Hz . EEG data were recorded continuously and digitally stored. Description: The posterior dominant rhythm consists of 10 Hz activity of moderate voltage (25-35 uV) seen predominantly in posterior head regions, asymmetric (left<right) and reactive to eye opening and eye closing.  Hyperventilation and photic stimulation were not performed.   ABNORMALITY - Background asymmetry, left<right IMPRESSION: This study is suggestive of cortical dysfunction arising from left occipital region likely secondary to underlying structural abnormality/stroke. No seizures or epileptiform discharges were seen throughout the recording. Priyanka  [name]   Overnight EEG with video Result Date: 10/27/2021 Lora Havens, MD     10/27/2021  9:47 AM Patient Name: Kimbly Eanes MRN: [id number] Epilepsy Attending: Lora Havens Referring Physician/Provider: Dr Su Monks Duration: 10/26/2021 8756 to 10/27/2021 0848  Patient history: 51 year old woman with a past medical his history significant for fibroids who presented as a pretty activated stroke code with EMS for aphasia and right-sided weakness c/f acute ischemic stroke. EEG to evaluate for seizure  Level of alertness: Awake, asleep  AEDs during EEG study: None  Technical aspects: This EEG study was done with scalp electrodes positioned according to the 10-20 International system of electrode placement. Electrical activity was acquired at a sampling rate of 500Hz  and reviewed with a high frequency filter of 70Hz  and a low frequency filter of 1Hz . EEG data were recorded continuously and digitally stored.  Description: The posterior dominant rhythm consists of 10 Hz activity of moderate voltage (25-35 uV) seen predominantly in posterior head regions, asymmetric (left<right) and reactive to eye opening and eye closing.  Hyperventilation and photic stimulation were not performed.    ABNORMALITY - Background asymmetry, left<right  IMPRESSION: This study is suggestive of cortical dysfunction arising from left occipital region likely secondary to underlying structural abnormality/stroke. No seizures or epileptiform discharges were seen throughout the recording.  Priyanka O Yadav   VAS [place] TRANSCRANIAL DOPPLER W BUBBLES Result Date: 10/28/2021  Transcranial Doppler with Bubble Patient Name:  TITYANA PAGAN  Date of Exam:   10/27/2021 Medical Rec #: [id number]        Accession #:    [id number] Date of Birth: [birth year]        Patient Gender: F Patient Age:   65 years Exam Location:  [facility] Procedure:      VAS [place] TRANSCRANIAL DOPPLER W BUBBLES Referring Phys: Elwin Sleight DE LA TORRE  --------------------------------------------------------------------------------  Indications: Stroke. Comparison Study:  No prior studies. Performing Technologist: Darlin Coco RDMS, RVT  Examination Guidelines: A complete evaluation includes B-mode imaging, spectral Doppler, color Doppler, and power Doppler as needed of all accessible portions of each vessel. Bilateral testing is considered an integral part of a complete examination. Limited examinations for reoccurring indications may be performed as noted.  Summary: No HITS at rest or during Valsalva. Negative transcranial Doppler Bubble study with no evidence of right to left intracardiac communication.  Negative TCD Bubble study *See table(s) above for TCD measurements and observations.  Diagnosing physician: Antony Contras MD Electronically signed by Antony Contras MD on 10/28/2021 at 1:18:58 PM.    Final     CT HEAD CODE STROKE WO CONTRAST Result Date: 10/26/2021 CLINICAL DATA:  Code stroke.  Right-sided weakness EXAM: CT HEAD WITHOUT CONTRAST TECHNIQUE: Contiguous axial images were obtained from the base of the skull through the vertex without intravenous contrast. COMPARISON:  None. FINDINGS: Brain: No evidence of acute infarction, hemorrhage, hydrocephalus, extra-axial collection or mass lesion/mass effect. Vascular: No hyperdense vessel or unexpected calcification. Skull: Normal. Negative for fracture or focal lesion. Sinuses/Orbits: No acute finding. Other: These results were communicated to [facility] at 10:00 am on 10/26/2021 by text page via the [facility] messaging system. ASPECTS [facility] Stroke Program Early CT Score) - Ganglionic level infarction (caudate, lentiform nuclei, internal capsule, insula, M1-M3 cortex): 7 - Supraganglionic infarction (M4-M6 cortex): 3 Total score (0-10 with 10 being normal): 10 IMPRESSION: No acute finding.  ASPECTS is 10. Electronically Signed   By: Jorje Guild M.D.   On: 10/26/2021 10:01   VAS [place] LOWER EXTREMITY VENOUS  (DVT) Result Date: 10/27/2021  Lower Venous DVT Study Patient Name:  TAMAKIA PORTO  Date of Exam:   10/27/2021 Medical Rec #: [id number]        Accession #:    [id number] Date of Birth: Oct 17, 1971        Patient Gender: F Patient Age:   58 years Exam Location:  [facility] Procedure:      VAS [place] LOWER EXTREMITY VENOUS (DVT) Referring Phys: Elwin Sleight DE LA TORRE --------------------------------------------------------------------------------  Indications: Stroke.  Comparison Study: No prior studies. Performing Technologist: Darlin Coco RDMS, RVT  Examination Guidelines: A complete evaluation includes B-mode imaging, spectral Doppler, color Doppler, and power Doppler as needed of all accessible portions of each vessel. Bilateral testing is considered an integral part of a complete examination. Limited examinations for reoccurring indications may be performed as noted. The reflux portion of the exam is performed with the patient in reverse Trendelenburg.  +---------+---------------+---------+-----------+----------+--------------+  RIGHT     Compressibility Phasicity Spontaneity Properties Thrombus Aging  Summary: RIGHT: - There is no evidence of deep vein thrombosis in the lower extremity.  - No cystic structure found in the popliteal fossa.  LEFT: - There is no evidence of deep vein thrombosis in the lower extremity.  - No cystic structure found in the popliteal fossa.  *See table(s) above for measurements and observations. Electronically signed by Deitra Mayo MD on 10/27/2021 at 5:57:09 PM.    Final    CT ANGIO HEAD NECK W WO CM W PERF (CODE STROKE) Result Date: 10/26/2021 CLINICAL DATA:  Right-sided weakness EXAM: CT ANGIOGRAPHY HEAD AND NECK CT PERFUSION BRAIN TECHNIQUE: Multidetector CT imaging of the head and neck was performed using the standard protocol during bolus administration of intravenous contrast. Multiplanar CT image reconstructions and MIPs were obtained to evaluate the vascular anatomy.  Carotid stenosis measurements (when applicable) are obtained utilizing NASCET criteria, using  the distal internal carotid diameter as the denominator. Multiphase CT imaging of the brain was performed following IV bolus contrast injection. Subsequent parametric perfusion maps were calculated using RAPID software. CONTRAST:  Dose is not known on this in progress study COMPARISON:  None. FINDINGS: CTA NECK FINDINGS Aortic arch: Normal Right carotid system: Vessels are smooth and widely patent Left carotid system: Vessels are smooth and widely patent Vertebral arteries: No proximal subclavian stenosis. Skeleton: Negative Other neck: No acute finding Upper chest: No acute finding Review of the MIP images confirms the above findings CTA HEAD FINDINGS Anterior circulation: No emergent large vessel occlusion. No proximal flow limiting stenosis, beading, or aneurysm Posterior circulation:  No emergent finding Venous sinuses: Unremarkable Review of the MIP images confirms the above findings CT Brain Perfusion Findings: ASPECTS: 10 CBF (<30%) Volume: 5mL Perfusion (Tmax>6.0s) volume: 50mL. With a 4 second threshold there is and even greater area of diminished perfusion in the left cerebral hemisphere, question shower of emboli or autolysis. These results were called by telephone at the time of interpretation on 10/26/2021 at 10:15 am to provider [facility] , who verbally acknowledged these results. IMPRESSION: 1. No emergent large vessel occlusion. 2. CT perfusion shows ischemic range blood flow in the left frontal lobe, suspect peripheral emboli. 3. No embolic source or significant atheromatous disease seen in the neck. Electronically Signed   By: Jorje Guild M.D.   On: 10/26/2021 10:18    12-lead ECG SR All prior EKG's in EPIC reviewed with no documented atrial fibrillation  Telemetry SR, ST, infrequent PVCs, no AFib  Assessment and Plan:  1. Cryptogenic stroke The patient presents with cryptogenic stroke.  The  patient has a TEE planned for this today.  Dr. Quentin Ore spoke at length with the patient about monitoring for afib with either a 30 day event monitor or an implantable loop recorder.  Risks, benefits, and alteratives to implantable loop recorder were discussed with the patient today.   At this time, the patient is very clear in their decision to proceed with implantable loop recorder.   Wound care was reviewed with the patient (keep incision clean and dry for 3 days).  Wound check follow up will be scheduled for the patient.  Please call with questions.   Bayley [place]arborough Dyane Dustman, PA-C 10/30/2021

## 2021-10-30 NOTE — Progress Notes (Signed)
°  Echocardiogram Echocardiogram Transesophageal has been performed.  Megan Chaney 10/30/2021, 2:01 PM

## 2021-10-30 NOTE — CV Procedure (Signed)
TEE: Anesthesia: Propofol  Normal EF 60% Small PFO with positive bubble study Normal valves No LAA thrombus No effusion  Normal RV  Jenkins Rouge MD [facility]

## 2021-10-30 NOTE — TOC Progression Note (Addendum)
Transition of Care [facility]) - Progression Note    Patient Details  Name: Megan Chaney MRN: [id number] Date of Birth: 1971-06-23  [facility] CM/SW Contact  Pollie Friar, RN Phone Number: 10/30/2021, 8:56 AM  Clinical Narrative:    CM heard back from [facility] that they are not in network with the patients insurance. She would have to be denied by [facility], [facility] and [facility] rehab in [place]. CM spoke with the patient and she is agreeable to going to [facility]. CM has faxed them her information. Will follow up today to see if they are able to offer a bed.   1125 am: [facility] is going to start insurance auth for admission.  Expected Discharge Plan: IP Rehab Facility Barriers to Discharge: Continued Medical Work up  Expected Discharge Plan and Services Expected Discharge Plan: [place]   Discharge Planning Services: CM Consult Post Acute Care Choice: IP Rehab Living arrangements for the past 2 months: Single Family Home                                       Social Determinants of Health (SDOH) Interventions    Readmission Risk Interventions No flowsheet data found.

## 2021-10-30 NOTE — H&P (Signed)
STROKE TEAM PROGRESS NOTE   INTERVAL HISTORY Patient is seen in her room with her occupational therapist at. the bedside.  She today has shown significant improvement in her expressive aphasia and right hemiparesis.  TEE shows no clot or definite cardiac source of embolism but a small PFO.  However TCD bubble study yesterday was negative.  Lower extremity venous Dopplers were negative for DVT vital signs are stable.   Anticardiolipin antibodies were negative Vitals:   10/30/21 1403 10/30/21 1413 10/30/21 1423 10/30/21 1441  BP: (!) 137/98 125/84 132/90 128/90  Pulse: (!) 116 (!) 107 (!) 104 (!) 114  Resp: 16 20 (!) 21 16  Temp:      TempSrc:      SpO2: 100% 100% 100% 100%  Weight:      Height:       CBC:  Recent Labs  Lab 10/26/21 0940 10/26/21 0948 10/28/21 0300 10/29/21 1628  WBC 14.0*  --  10.9*  --   NEUTROABS 11.2*  --   --   --   HGB 7.2*   < > 7.5* 8.5*  HCT 24.2*   < > 23.4* 26.8*  MCV 85.5  --  82.1  --   PLT 457*  --  389  --    < > = values in this interval not displayed.   Basic Metabolic Panel:  Recent Labs  Lab 10/26/21 0940 10/26/21 0948  NA 138 138  K 4.0 3.9  CL 107 107  CO2 21*  --   GLUCOSE 118* 116*  BUN 15 15  CREATININE 0.64 0.60  CALCIUM 8.8*  --    Lipid Panel:  Recent Labs  Lab 10/27/21 0326  CHOL 145  TRIG 69  HDL 52  CHOLHDL 2.8  VLDL 14  LDLCALC 79   HgbA1c:  Recent Labs  Lab 10/27/21 0326  HGBA1C <4.2*   Urine Drug Screen:  Recent Labs  Lab 10/26/21 1239  LABOPIA NONE DETECTED  COCAINSCRNUR NONE DETECTED  LABBENZ NONE DETECTED  AMPHETMU NONE DETECTED  THCU POSITIVE*  LABBARB NONE DETECTED    Alcohol Level No results for input(s): ETH in the last 168 hours.  IMAGING past 24 hours ECHO TEE  Result Date: 10/30/2021    TRANSESOPHOGEAL ECHO REPORT   Patient Name:   ABBEGALE STEHLE Date of Exam: 10/30/2021 Medical Rec #:  [id number]       Height:       63.0 in Accession #:    [id number]      Weight:       161.8 lb Date of  Birth:  [birth year]       BSA:          1.767 m Patient Age:    51 years        BP:           142/78 mmHg Patient Gender: F               HR:           106 bpm. Exam Location:  Inpatient Procedure: Transesophageal Echo Indications:    Stroke  History:        Patient has prior history of Echocardiogram examinations.                 Stroke.  Sonographer:    Johny Chess RDCS Referring Phys: [id number] [facility] AMERY PROCEDURE: The transesophogeal probe was passed without difficulty through the esophogus of the patient. Sedation performed by different physician. The patient's  vital signs; including heart rate, blood pressure, and oxygen saturation; remained stable throughout the procedure. The patient developed no complications during the procedure. IMPRESSIONS  1. Small PFO with positive bubble study Latter suboptimal as patiensts anesthesia was wearing off and she was trying to pull probe out.  2. Left ventricular ejection fraction, by estimation, is 60 to 65%. The left ventricle has normal function. The left ventricle has no regional wall motion abnormalities.  3. Right ventricular systolic function is normal. The right ventricular size is normal.  4. No left atrial/left atrial appendage thrombus was detected.  5. The mitral valve is abnormal. Trivial mitral valve regurgitation. No evidence of mitral stenosis.  6. The aortic valve is normal in structure. Aortic valve regurgitation is not visualized. No aortic stenosis is present.  7. The inferior vena cava is normal in size with greater than 50% respiratory variability, suggesting right atrial pressure of 3 mmHg.  8. Evidence of atrial level shunting detected by color flow Doppler. Conclusion(s)/Recommendation(s): Normal biventricular function without evidence of hemodynamically significant valvular heart disease. FINDINGS  Left Ventricle: Left ventricular ejection fraction, by estimation, is 60 to 65%. The left ventricle has normal function. The left ventricle has no  regional wall motion abnormalities. The left ventricular internal cavity size was normal in size. There is  no left ventricular hypertrophy. Right Ventricle: The right ventricular size is normal. No increase in right ventricular wall thickness. Right ventricular systolic function is normal. Left Atrium: Left atrial size was normal in size. No left atrial/left atrial appendage thrombus was detected. Right Atrium: Right atrial size was normal in size. Pericardium: There is no evidence of pericardial effusion. Mitral Valve: The mitral valve is abnormal. There is mild thickening of the mitral valve leaflet(s). There is mild calcification of the mitral valve leaflet(s). Trivial mitral valve regurgitation. No evidence of mitral valve stenosis. Tricuspid Valve: The tricuspid valve is normal in structure. Tricuspid valve regurgitation is trivial. No evidence of tricuspid stenosis. Aortic Valve: The aortic valve is normal in structure. Aortic valve regurgitation is not visualized. No aortic stenosis is present. Pulmonic Valve: The pulmonic valve was normal in structure. Pulmonic valve regurgitation is not visualized. No evidence of pulmonic stenosis. Aorta: The aortic root is normal in size and structure. Venous: The inferior vena cava is normal in size with greater than 50% respiratory variability, suggesting right atrial pressure of 3 mmHg. IAS/Shunts: Evidence of atrial level shunting detected by color flow Doppler. Additional Comments: Small PFO with positive bubble study Latter suboptimal as patiensts anesthesia was wearing off and she was trying to pull probe out. Jenkins Rouge MD Electronically signed by Jenkins Rouge MD Signature Date/Time: 10/30/2021/1:54:37 PM    Final     PHYSICAL EXAM General:  Alert, obese middle-aged African-American female in no acute distress    NEURO:  Mental Status: AA&Ox3  Speech/Language: speech is with only mild nonfluent speech and difficulty with some word finding difficulty..   Patient is  able to name simple objects and has mildly impaired repetition.  Good comprehension  Cranial Nerves:  II: PERRL. Right partial peripheral visual field cut III, IV, VI: EOMI. Eyelids elevate symmetrically.  V: Sensation is intact to light touch and symmetrical to face.  VII: Smile is symmetrical. Able to puff cheeks and raise eyebrows.  VIII: hearing intact to voice. IX, X: Phonation is normal.  XII: right sided tongue deviation Motor: 5/5 strength to LUE and LLE, 0/5 to RUE, 3/5 hand RLE 4/5 strength. Sensation- Intact to light  touch bilaterally. Extinction absent to light touch to DSS.   Coordination: FTN intact to left Gait- deferred   ASSESSMENT/PLAN Ms. Makailyn Mccormick is a 51 y.o. female with no significant past medical history presenting with aphasia and right sided weakness.  She was found to have multiple acute infarcts in the left MCA territory on MRI.  Stroke: Left middle cerebral artery branch infarct likely secondary due to embolism of cryptogenic source Code Stroke CT head No acute abnormality.  ASPECTS 10.    CTA head & neck No emergent LVO, ischemic range blood flow in frontal lobe, no significant atheromatous disease in the neck MRI  multifocal acute/subacute infarcts in left MCA territory 2D Echo EF 60-65%, no interatrial shunt TEE small PFO.  No clot or cardiac source of embolism TCD bubble study negative for right-to-left shunt Lower extremity doppler no evidence of DVT LDL 79 HgbA1c 4.3 VTE prophylaxis - lovenox    There are no active orders of the following types: Diet, Nourishments.   No antithrombotic prior to admission, now on aspirin 81 mg and Plavix 75 mg daily.  For 3 months followed by aspirin alone Therapy recommendations:  CIR Disposition:  pending  Hypertension Home meds:  none Stable Permissive hypertension (OK if < 220/120) but gradually normalize in 5-7 days Long-term BP goal normotensive  Hyperlipidemia Home meds:  none LDL  79, goal < 70 Add atorvastatin 40 mg daily  Continue statin at discharge   Other Stroke Risk Factors Family hx stroke (mother, in her 86s)   Other Active Problems none  Hospital day # 4   Patient presented with expressive aphasia due to left MCA branch infarct likely of cryptogenic etiology.  She unfortunately presented outside candidate for thrombolysis and was not a candidate for mechanical thrombectomy.   TEE shows a small PFO but transcranial Doppler bubble study is negative.  Patient has a ROPE score of 7 indicating 70% chance that the PFO is the cause of the stroke.  We will ask cardiology to review TEE and if she has a PFO which can be closed this can be done electively.  Aspirin and Plavix for 3 weeks followed by aspirin along with aggressive risk factor modification.  Continue ongoing physical, occupational and speech therapy .Marland Kitchen  Patient counseled to quit marijuana and cigarettes and voiced agreement.  Transfer to inpatient rehab when bed available in the next few days.  Long discussion with family member at the bedside and answered questions.  Greater than 50% time during this 25-minute visit was spent on counseling and coordination of care and discussion about stroke prevention and evaluation and treatment and answering questions.     Antony Contras, MD Medical Director [facility] Pager: [phone] 10/30/2021 2:41 PM   To contact Stroke Continuity provider, please refer to [email or web address] After hours, contact General Neurology

## 2021-10-30 NOTE — Anesthesia Postprocedure Evaluation (Signed)
Anesthesia Post Note  Patient: Megan Chaney  Procedure(s) Performed: TRANSESOPHAGEAL ECHOCARDIOGRAM (TEE) BUBBLE STUDY     Patient location during evaluation: Endoscopy Anesthesia Type: MAC Level of consciousness: awake and alert Pain management: pain level controlled Vital Signs Assessment: post-procedure vital signs reviewed and stable Respiratory status: spontaneous breathing, nonlabored ventilation and respiratory function stable Cardiovascular status: blood pressure returned to baseline and stable Postop Assessment: no apparent nausea or vomiting Anesthetic complications: no   No notable events documented.  Last Vitals:  Vitals:   10/30/21 1354 10/30/21 1413  BP: 126/71 125/84  Pulse: (!) 123 (!) 107  Resp: 19 20  Temp: 36.8 C   SpO2: 100% 100%    Last Pain:  Vitals:   10/30/21 1354  TempSrc: Oral  PainSc: 0-No pain                 Lidia Collum

## 2021-10-30 NOTE — Anesthesia Procedure Notes (Signed)
Procedure Name: MAC Date/Time: 10/30/2021 1:35 PM Performed by: Trinna Post., CRNA Pre-anesthesia Checklist: Patient identified, Emergency Drugs available, Suction available, Patient being monitored and Timeout performed Patient Re-evaluated:Patient Re-evaluated prior to induction Oxygen Delivery Method: Nasal cannula Preoxygenation: Pre-oxygenation with 100% oxygen Induction Type: IV induction Placement Confirmation: positive ETCO2

## 2021-10-30 NOTE — Progress Notes (Signed)
SLP Cancellation Note  Patient Details Name: Megan Chaney MRN: [id number] DOB: Feb 10, 1971   Cancelled treatment:       Reason Eval/Treat Not Completed: Patient at procedure or test/unavailable.  Unable to provide treatment at this time, as pt is currently off unit. Will continue efforts.  Jarreau Callanan B. Quentin Ore, [facility], [place] Speech Language Pathologist Office: [phone]  Shonna Chock 10/30/2021, 1:08 PM

## 2021-10-30 NOTE — Progress Notes (Signed)
Physical Therapy Treatment Patient Details Name: Megan Chaney MRN: [id number] DOB: 1971-02-04 Today's Date: 10/30/2021   History of Present Illness 51 y/o female admitted 1/2 after being found down at work with aphasia and R sided weakness. Pt found to have Multifocal acute/subacute nonhemorrhagic infarcts involving the L MCA territory. PMHx: chronic iron deficiency anemia secondary to fibroids and menorrhagia.    PT Comments    Patient progressing this session with ambulation distance, endurance and initiating stair training.  She remains tachycardic with HR to 141 with ambulation and stair training this session needing frequent rest breaks and fatigue.  Noted R hip weakness affecting gait as well knee and ankle weakness and discussed continuing treatment to work on it, but pt too fatigued.  Feel she will benefit from acute inpatient rehab at d/c.  Will follow until d/c.   Recommendations for follow up therapy are one component of a multi-disciplinary discharge planning process, led by the attending physician.  Recommendations may be updated based on patient status, additional functional criteria and insurance authorization.  Follow Up Recommendations  Acute inpatient rehab (3hours/day)     Assistance Recommended at Discharge Frequent or constant Supervision/Assistance  Patient can return home with the following A lot of help with walking and/or transfers;Help with stairs or ramp for entrance;A lot of help with bathing/dressing/bathroom;Assistance with cooking/housework;Direct supervision/assist for medications management;Assist for transportation;Direct supervision/assist for financial management   Equipment Recommendations  Cane    Recommendations for Other Services       Precautions / Restrictions Precautions Precautions: Fall     Mobility  Bed Mobility Overal bed mobility: Needs Assistance       Supine to sit: Supervision;HOB elevated Sit to supine: Supervision         Transfers Overall transfer level: Needs assistance Equipment used: None Transfers: Sit to/from Stand Sit to Stand: Min guard           General transfer comment: assist for balance/safety, increased time to rise    Ambulation/Gait Ambulation/Gait assistance: Min assist Gait Distance (Feet): 140 Feet (x 2) Assistive device: None Gait Pattern/deviations: Step-through pattern;Knee flexed in stance - right;Decreased stance time - right;Decreased dorsiflexion - left       General Gait Details: increased lateral weight shift to R with hip sway, min A for balance, safety and cues for L UE relaxing at side since keeps it up in high guard due to balance and R LE weakness   Stairs Stairs: Yes Stairs assistance: Min assist Stair Management: One rail Left;Step to pattern;Alternating pattern;Forwards;Two rails Number of Stairs: 2 General stair comments: educated in technique with step to pattern, pt with self selected pattern, however, and one step with step through pattern without LOB using rail on L to ascend and both rails to descend with help to get R UE on to rail   Wheelchair Mobility    Modified Rankin (Stroke Patients Only) Modified Rankin (Stroke Patients Only) Pre-Morbid Rankin Score: No symptoms Modified Rankin: Moderately severe disability     Balance Overall balance assessment: Needs assistance Sitting-balance support: Feet supported Sitting balance-Leahy Scale: Good     Standing balance support: Single extremity supported;No upper extremity supported Standing balance-Leahy Scale: Poor Standing balance comment: min A for balance without UE support               High Level Balance Comments: standing step taps to first step with and without UE support with min to mod A  Cognition Arousal/Alertness: Awake/alert Behavior During Therapy: WFL for tasks assessed/performed Overall Cognitive Status: Impaired/Different from baseline Area of  Impairment: Following commands;Awareness;Problem solving                       Following Commands: Follows one step commands consistently;Follows one step commands with increased time   Awareness: Emergent Problem Solving: Slow processing          Exercises      General Comments General comments (skin integrity, edema, etc.): HR up o 141 with ambulation and stairs,  Several seated rest breaks between walks and stairs, etc      Pertinent Vitals/Pain Faces Pain Scale: Hurts a little bit Pain Location: R UE/IV site Pain Descriptors / Indicators: Sore Pain Intervention(s): Monitored during session    Home Living                          Prior Function            PT Goals (current goals can now be found in the care plan section) Progress towards PT goals: Progressing toward goals    Frequency    Min 4X/week      PT Plan Current plan remains appropriate    Co-evaluation              AM-PAC PT "6 Clicks" Mobility   Outcome Measure  Help needed turning from your back to your side while in a flat bed without using bedrails?: None Help needed moving from lying on your back to sitting on the side of a flat bed without using bedrails?: A Little Help needed moving to and from a bed to a chair (including a wheelchair)?: A Little Help needed standing up from a chair using your arms (e.g., wheelchair or bedside chair)?: A Lot Help needed to walk in hospital room?: A Little Help needed climbing 3-5 steps with a railing? : A Lot 6 Click Score: 17    End of Session Equipment Utilized During Treatment: Gait belt Activity Tolerance: Patient limited by fatigue;Treatment limited secondary to medical complications (Comment) (increased HR) Patient left: with call bell/phone within reach;with bed alarm set;in bed   PT Visit Diagnosis: Other abnormalities of gait and mobility (R26.89);Muscle weakness (generalized) (M62.81)     Time: 3888-2800 PT Time  Calculation (min) (ACUTE ONLY): 26 min  Charges:  $Gait Training: 23-37 mins                     Megan Chaney, PT Acute Rehabilitation Services LKJZP:[phone] Office:[phone] 10/30/2021    Megan Chaney 10/30/2021, 1:11 PM

## 2021-10-31 ENCOUNTER — Encounter (HOSPITAL_COMMUNITY): Payer: BLUE CROSS/BLUE SHIELD

## 2021-10-31 ENCOUNTER — Inpatient Hospital Stay (HOSPITAL_COMMUNITY): Payer: BLUE CROSS/BLUE SHIELD

## 2021-10-31 DIAGNOSIS — M79601 Pain in right arm: Secondary | ICD-10-CM

## 2021-10-31 DIAGNOSIS — M7989 Other specified soft tissue disorders: Secondary | ICD-10-CM

## 2021-10-31 DIAGNOSIS — L538 Other specified erythematous conditions: Secondary | ICD-10-CM

## 2021-10-31 LAB — BASIC METABOLIC PANEL
Anion gap: 10 (ref 5–15)
BUN: 15 mg/dL (ref 6–20)
CO2: 19 mmol/L — ABNORMAL LOW (ref 22–32)
Calcium: 8.8 mg/dL — ABNORMAL LOW (ref 8.9–10.3)
Chloride: 109 mmol/L (ref 98–111)
Creatinine, Ser: 0.63 mg/dL (ref 0.44–1.00)
GFR, Estimated: >60 mL/min (ref >60)
Glucose, Bld: 98 mg/dL (ref 70–99)
Potassium: 3.6 mmol/L (ref 3.5–5.1)
Sodium: 138 mmol/L (ref 135–145)

## 2021-10-31 MED ORDER — ENOXAPARIN SODIUM 80 MG/0.8ML IJ SOSY
1.0000 mg/kg | PREFILLED_SYRINGE | Freq: Two times a day (BID) | INTRAMUSCULAR | Status: DC
  Filled 2021-10-31: qty 0.8

## 2021-10-31 MED ORDER — POLYETHYLENE GLYCOL 3350 17 G PO PACK
17.0000 g | PACK | Freq: Two times a day (BID) | ORAL | Status: DC
  Administered 2021-11-01 – 2021-11-02 (×2): 17 g via ORAL
  Filled 2021-10-31 (×6): qty 1

## 2021-10-31 MED ORDER — METOPROLOL SUCCINATE ER 25 MG PO TB24
12.5000 mg | ORAL_TABLET | Freq: Every day | ORAL | Status: DC
  Administered 2021-10-31 – 2021-11-02 (×3): 12.5 mg via ORAL
  Filled 2021-10-31 (×4): qty 1

## 2021-10-31 MED ORDER — SENNOSIDES-DOCUSATE SODIUM 8.6-50 MG PO TABS
1.0000 | ORAL_TABLET | Freq: Every day | ORAL | Status: DC
  Administered 2021-10-31 – 2021-11-02 (×3): 1 via ORAL
  Filled 2021-10-31 (×3): qty 1

## 2021-10-31 MED ORDER — ENOXAPARIN SODIUM 60 MG/0.6ML IJ SOSY
60.0000 mg | PREFILLED_SYRINGE | Freq: Once | INTRAMUSCULAR | Status: AC
  Administered 2021-10-31: 60 mg via SUBCUTANEOUS
  Filled 2021-10-31: qty 0.6

## 2021-10-31 NOTE — Progress Notes (Signed)
ANTICOAGULATION CONSULT NOTE - Initial Consult  Pharmacy Consult for enoxaparin Indication: DVT  Allergies  Allergen Reactions   Latex Hives and Rash   Other Hives and Rash    Powder in latex gloves    Patient Measurements: Height: 5\' 3"  (160 cm) Weight: 73.4 kg (161 lb 13.1 oz) IBW/kg (Calculated) : 52.4  Vital Signs: Temp: 98.6 F (37 C) (01/07 1737) Temp Source: Oral (01/07 1737) BP: 156/85 (01/07 1737) Pulse Rate: 126 (01/07 1737)  Labs: Recent Labs    10/29/21 1628 10/31/21 0941  HGB 8.5*  --   HCT 26.8*  --   CREATININE  --  0.63    Estimated Creatinine Clearance: 82.5 mL/min (by C-G formula based on SCr of 0.63 mg/dL).   Medical History: Past Medical History:  Diagnosis Date   Anemia    Fibroid    GERD (gastroesophageal reflux disease)     Assessment: 51 year old female admitted earlier this month for possible stroke. RUE ultrasound shows DVT. Patient received 40mg  of sq lovenox this am will give supplemental dose now then start full dose in am.   Goal of Therapy:  Anti-Xa level 0.6-1 units/ml 4hrs after LMWH dose given Monitor platelets by anticoagulation protocol: Yes   Plan:  Lovenox 60mg  now then 1mg /kg bid tomorrow morning CBC every 72 hours while on lovenox  Erin Hearing PharmD., BCPS Clinical Pharmacist 10/31/2021 8:02 PM

## 2021-10-31 NOTE — Plan of Care (Signed)
°  Problem: Education: Goal: Knowledge of General Education information will improve Description: Including pain rating scale, medication(s)/side effects and non-pharmacologic comfort measures Outcome: Progressing   Problem: Health Behavior/Discharge Planning: Goal: Ability to manage health-related needs will improve Outcome: Progressing   Problem: Clinical Measurements: Goal: Ability to maintain clinical measurements within normal limits will improve Outcome: Progressing Goal: Will remain free from infection Outcome: Progressing Goal: Diagnostic test results will improve Outcome: Progressing Goal: Respiratory complications will improve Outcome: Progressing Goal: Cardiovascular complication will be avoided Outcome: Progressing   Problem: Activity: Goal: Risk for activity intolerance will decrease Outcome: Progressing   Problem: Nutrition: Goal: Adequate nutrition will be maintained Outcome: Progressing   Problem: Coping: Goal: Level of anxiety will decrease Outcome: Progressing   Problem: Elimination: Goal: Will not experience complications related to bowel motility Outcome: Progressing Goal: Will not experience complications related to urinary retention Outcome: Progressing   Problem: Pain Managment: Goal: General experience of comfort will improve Outcome: Progressing   Problem: Safety: Goal: Ability to remain free from injury will improve Outcome: Progressing   Problem: Skin Integrity: Goal: Risk for impaired skin integrity will decrease Outcome: Progressing   Problem: Education: Goal: Knowledge of disease or condition will improve Outcome: Progressing Goal: Knowledge of secondary prevention will improve (SELECT ALL) Outcome: Progressing Goal: Knowledge of patient specific risk factors will improve (INDIVIDUALIZE FOR PATIENT) Outcome: Progressing Goal: Individualized Educational Video(s) Outcome: Progressing   Problem: Coping: Goal: Will verbalize  positive feelings about self Outcome: Progressing Goal: Will identify appropriate support needs Outcome: Progressing   Problem: Health Behavior/Discharge Planning: Goal: Ability to manage health-related needs will improve Outcome: Progressing   Problem: Self-Care: Goal: Ability to participate in self-care as condition permits will improve Outcome: Progressing Goal: Verbalization of feelings and concerns over difficulty with self-care will improve Outcome: Progressing Goal: Ability to communicate needs accurately will improve Outcome: Progressing   Problem: Nutrition: Goal: Risk of aspiration will decrease Outcome: Progressing

## 2021-10-31 NOTE — Progress Notes (Signed)
Patient 1737 vitals: BP 156/85, HR 126, no PRN medications prescribed on MAR to treat; other vitals WNL, no s/s distress, AMS or SOB. Pt MEWS yellow throughout shift. HR range: 109-126. Charge Nurse Gaspar Bidding informed throughout day. MD Amery notified by chat. Will continue to monitor.

## 2021-10-31 NOTE — Care Management (Signed)
LVM w [facility] to check on insurance auth and bed status. Awaiting on call back.

## 2021-10-31 NOTE — Progress Notes (Signed)
Upper extremity venous RT study completed.  Preliminary results relayed to Kurtis Bushman, MD.  See CV Proc for preliminary results report.   Darlin Coco, RDMS, RVT

## 2021-10-31 NOTE — Plan of Care (Signed)
Problem: Education: Goal: Knowledge of General Education information will improve Description: Including pain rating scale, medication(s)/side effects and non-pharmacologic comfort measures 10/31/2021 1210 by Bess Harvest, RN Outcome: Progressing 10/31/2021 0838 by Bess Harvest, RN Outcome: Progressing   Problem: Health Behavior/Discharge Planning: Goal: Ability to manage health-related needs will improve 10/31/2021 1210 by Bess Harvest, RN Outcome: Progressing 10/31/2021 0838 by Bess Harvest, RN Outcome: Progressing   Problem: Clinical Measurements: Goal: Ability to maintain clinical measurements within normal limits will improve 10/31/2021 1210 by Bess Harvest, RN Outcome: Progressing 10/31/2021 0838 by Bess Harvest, RN Outcome: Progressing Goal: Will remain free from infection 10/31/2021 1210 by Bess Harvest, RN Outcome: Progressing 10/31/2021 0838 by Bess Harvest, RN Outcome: Progressing Goal: Diagnostic test results will improve 10/31/2021 1210 by Bess Harvest, RN Outcome: Progressing 10/31/2021 0838 by Bess Harvest, RN Outcome: Progressing Goal: Respiratory complications will improve 10/31/2021 1210 by Bess Harvest, RN Outcome: Progressing 10/31/2021 0838 by Bess Harvest, RN Outcome: Progressing Goal: Cardiovascular complication will be avoided 10/31/2021 1210 by Bess Harvest, RN Outcome: Progressing 10/31/2021 0838 by Bess Harvest, RN Outcome: Progressing   Problem: Activity: Goal: Risk for activity intolerance will decrease 10/31/2021 1210 by Bess Harvest, RN Outcome: Progressing 10/31/2021 0838 by Bess Harvest, RN Outcome: Progressing   Problem: Nutrition: Goal: Adequate nutrition will be maintained 10/31/2021 1210 by Bess Harvest, RN Outcome: Progressing 10/31/2021 0838 by Bess Harvest, RN Outcome: Progressing   Problem: Coping: Goal: Level of anxiety will decrease 10/31/2021 1210 by Bess Harvest, RN Outcome:  Progressing 10/31/2021 0838 by Bess Harvest, RN Outcome: Progressing   Problem: Elimination: Goal: Will not experience complications related to bowel motility 10/31/2021 1210 by Bess Harvest, RN Outcome: Progressing 10/31/2021 0838 by Bess Harvest, RN Outcome: Progressing Goal: Will not experience complications related to urinary retention 10/31/2021 1210 by Bess Harvest, RN Outcome: Progressing 10/31/2021 0838 by Bess Harvest, RN Outcome: Progressing   Problem: Pain Managment: Goal: General experience of comfort will improve 10/31/2021 1210 by Bess Harvest, RN Outcome: Progressing 10/31/2021 0838 by Bess Harvest, RN Outcome: Progressing   Problem: Safety: Goal: Ability to remain free from injury will improve 10/31/2021 1210 by Bess Harvest, RN Outcome: Progressing 10/31/2021 0838 by Bess Harvest, RN Outcome: Progressing   Problem: Skin Integrity: Goal: Risk for impaired skin integrity will decrease 10/31/2021 1210 by Bess Harvest, RN Outcome: Progressing 10/31/2021 0838 by Bess Harvest, RN Outcome: Progressing   Problem: Education: Goal: Knowledge of disease or condition will improve 10/31/2021 1210 by Bess Harvest, RN Outcome: Progressing 10/31/2021 0838 by Bess Harvest, RN Outcome: Progressing Goal: Knowledge of secondary prevention will improve (SELECT ALL) 10/31/2021 1210 by Bess Harvest, RN Outcome: Progressing 10/31/2021 0838 by Bess Harvest, RN Outcome: Progressing Goal: Knowledge of patient specific risk factors will improve (INDIVIDUALIZE FOR PATIENT) 10/31/2021 1210 by Bess Harvest, RN Outcome: Progressing 10/31/2021 0838 by Bess Harvest, RN Outcome: Progressing Goal: Individualized Educational Video(s) 10/31/2021 1210 by Bess Harvest, RN Outcome: Progressing 10/31/2021 0838 by Bess Harvest, RN Outcome: Progressing   Problem: Coping: Goal: Will verbalize positive feelings about self 10/31/2021 1210 by Bess Harvest, RN Outcome: Progressing 10/31/2021 0838 by Bess Harvest, RN Outcome: Progressing Goal: Will identify appropriate support needs 10/31/2021 1210 by Bess Harvest, RN Outcome: Progressing 10/31/2021 0838 by Bess Harvest, RN Outcome: Progressing   Problem: Health Behavior/Discharge Planning: Goal: Ability to manage health-related needs will improve 10/31/2021 1210 by Bess Harvest, RN Outcome: Progressing 10/31/2021 0838 by Bess Harvest, RN Outcome: Progressing   Problem: Self-Care: Goal: Ability to participate in self-care as condition permits  will improve 10/31/2021 1210 by Bess Harvest, RN Outcome: Progressing 10/31/2021 0838 by Bess Harvest, RN Outcome: Progressing Goal: Verbalization of feelings and concerns over difficulty with self-care will improve 10/31/2021 1210 by Bess Harvest, RN Outcome: Progressing 10/31/2021 0838 by Bess Harvest, RN Outcome: Progressing Goal: Ability to communicate needs accurately will improve 10/31/2021 1210 by Bess Harvest, RN Outcome: Progressing 10/31/2021 0838 by Bess Harvest, RN Outcome: Progressing   Problem: Nutrition: Goal: Risk of aspiration will decrease 10/31/2021 1210 by Bess Harvest, RN Outcome: Progressing 10/31/2021 0838 by Bess Harvest, RN Outcome: Progressing

## 2021-10-31 NOTE — Progress Notes (Signed)
PROGRESS NOTE    Caytlyn Evers  YNW:[id number] DOB: 1971-04-06 DOA: 10/26/2021 PCP: Michael Boston, MD    Brief Narrative:  : Madai Nuccio is a 51 y.o. female with medical history significant of chronic iron deficiency anemia secondary to fibroids and menorrhagia on hormone therapy, cigar smoker, GERD, presented with sudden onset of right-sided weakness and aphagia  1/3Neurology was consulted.  Pt family at bedside reports she is little better with speech, but overall still with expressive aphagia. Tries to get her words out  1/4 able to get more words out today compared to yesterday.She reports being on her menstrual cycle currently  1/5 able to get more words out today and lift rue more. TEE tomorrow 1/6 TEE today.  Complaining of right upper extremity swelling, erythema and pain where IV site was . 1/7 no overnight issues. Able to raise LUE more today almost above the shoulder. Able to form more words .   Consultants:  Neurology, cardiology  Procedures:   Antimicrobials:      Subjective: No cp, sob, dizziness  Objective: Vitals:   10/31/21 0422 10/31/21 0819 10/31/21 1238 10/31/21 1448  BP: 105/81 111/85 (!) 147/82 128/86  Pulse: (!) 105 (!) 109 (!) 112 (!) 113  Resp: 18 20 16 18   Temp: 98.8 F (37.1 C) 98.7 F (37.1 C) 98.4 F (36.9 C) 98.8 F (37.1 C)  TempSrc: Oral Oral Oral Oral  SpO2: 100% 100% 100% 100%  Weight:      Height:        Intake/Output Summary (Last 24 hours) at 10/31/2021 1451 Last data filed at 10/30/2021 1840 Gross per 24 hour  Intake 240 ml  Output --  Net 240 ml    Filed Weights   10/26/21 0900  Weight: 73.4 kg    Examination: Nad, calm Cta no w/r Reg s1/s2 no gallop Soft benign +bs No edema Speech improving, able to move rue little over the shoulder. Grip ru hand very minimally better.    Data Reviewed: I have personally reviewed following labs and imaging studies  CBC: Recent Labs  Lab 10/26/21 0940 10/26/21 0948  10/27/21 0326 10/28/21 0300 10/29/21 1628  WBC 14.0*  --   --  10.9*  --   NEUTROABS 11.2*  --   --   --   --   HGB 7.2* 8.2* 7.5* 7.5* 8.5*  HCT 24.2* 24.0* 24.5* 23.4* 26.8*  MCV 85.5  --   --  82.1  --   PLT 457*  --   --  389  --    Basic Metabolic Panel: Recent Labs  Lab 10/26/21 0940 10/26/21 0948 10/31/21 0941  NA 138 138 138  K 4.0 3.9 3.6  CL 107 107 109  CO2 21*  --  19*  GLUCOSE 118* 116* 98  BUN 15 15 15   CREATININE 0.64 0.60 0.63  CALCIUM 8.8*  --  8.8*   GFR: Estimated Creatinine Clearance: 82.5 mL/min (by C-G formula based on SCr of 0.63 mg/dL). Liver Function Tests: Recent Labs  Lab 10/26/21 0940  AST 28  ALT 25  ALKPHOS 57  BILITOT 0.4  PROT 6.5  ALBUMIN 3.7   No results for input(s): LIPASE, AMYLASE in the last 168 hours. No results for input(s): AMMONIA in the last 168 hours. Coagulation Profile: Recent Labs  Lab 10/26/21 0940  INR 0.9   Cardiac Enzymes: No results for input(s): CKTOTAL, CKMB, CKMBINDEX, TROPONINI in the last 168 hours. BNP (last 3 results) No results for input(s):  PROBNP in the last 8760 hours. HbA1C: No results for input(s): HGBA1C in the last 72 hours.  CBG: Recent Labs  Lab 10/26/21 0938  GLUCAP 103*   Lipid Profile: No results for input(s): CHOL, HDL, LDLCALC, TRIG, CHOLHDL, LDLDIRECT in the last 72 hours.  Thyroid Function Tests: No results for input(s): TSH, T4TOTAL, FREET4, T3FREE, THYROIDAB in the last 72 hours.  Anemia Panel: No results for input(s): VITAMINB12, FOLATE, FERRITIN, TIBC, IRON, RETICCTPCT in the last 72 hours.  Sepsis Labs: No results for input(s): PROCALCITON, LATICACIDVEN in the last 168 hours.  Recent Results (from the past 240 hour(s))  Resp Panel by RT-PCR (Flu A&B, Covid) Nasopharyngeal Swab     Status: None   Collection Time: 10/27/21  3:55 PM   Specimen: Nasopharyngeal Swab; Nasopharyngeal(NP) swabs in vial transport medium  Result Value Ref Range Status   SARS Coronavirus  2 by RT PCR NEGATIVE NEGATIVE Final    Comment: (NOTE) SARS-CoV-2 target nucleic acids are NOT DETECTED.  The SARS-CoV-2 RNA is generally detectable in upper respiratory specimens during the acute phase of infection. The lowest concentration of SARS-CoV-2 viral copies this assay can detect is 138 copies/mL. A negative result does not preclude SARS-Cov-2 infection and should not be used as the sole basis for treatment or other patient management decisions. A negative result may occur with  improper specimen collection/handling, submission of specimen other than nasopharyngeal swab, presence of viral mutation(s) within the areas targeted by this assay, and inadequate number of viral copies(<138 copies/mL). A negative result must be combined with clinical observations, patient history, and epidemiological information. The expected result is Negative.  Fact Sheet for Patients:  EntrepreneurPulse.com.au  Fact Sheet for Healthcare Providers:  IncredibleEmployment.be  This test is no t yet approved or cleared by the [place] FDA and  has been authorized for detection and/or diagnosis of SARS-CoV-2 by FDA under an Emergency Use Authorization (EUA). This EUA will remain  in effect (meaning this test can be used) for the duration of the COVID-19 declaration under Section 564(b)(1) of the Act, 21 U.S.C.section 360bbb-3(b)(1), unless the authorization is terminated  or revoked sooner.       Influenza A by PCR NEGATIVE NEGATIVE Final   Influenza B by PCR NEGATIVE NEGATIVE Final    Comment: (NOTE) The Xpert Xpress SARS-CoV-2/FLU/RSV plus assay is intended as an aid in the diagnosis of influenza from Nasopharyngeal swab specimens and should not be used as a sole basis for treatment. Nasal washings and aspirates are unacceptable for Xpert Xpress SARS-CoV-2/FLU/RSV testing.  Fact Sheet for Patients: EntrepreneurPulse.com.au  Fact  Sheet for Healthcare Providers: IncredibleEmployment.be  This test is not yet approved or cleared by the [place] FDA and has been authorized for detection and/or diagnosis of SARS-CoV-2 by FDA under an Emergency Use Authorization (EUA). This EUA will remain in effect (meaning this test can be used) for the duration of the COVID-19 declaration under Section 564(b)(1) of the Act, 21 U.S.C. section 360bbb-3(b)(1), unless the authorization is terminated or revoked.  Performed at [facility], [place] [address], [place], [place] [address]          Radiology Studies: ECHO TEE  Result Date: 10/30/2021    TRANSESOPHOGEAL ECHO REPORT   Patient Name:   ISYS TIETJE Date of Exam: 10/30/2021 Medical Rec #:  [id number]       Height:       63.0 in Accession #:    [id number]      Weight:  161.8 lb Date of Birth:  08/31/1971       BSA:          1.767 m Patient Age:    42 years        BP:           142/78 mmHg Patient Gender: F               HR:           106 bpm. Exam Location:  Inpatient Procedure: Transesophageal Echo Indications:    Stroke  History:        Patient has prior history of Echocardiogram examinations.                 Stroke.  Sonographer:    Johny Chess RDCS Referring Phys: [id number] [facility] Jendayi Berling PROCEDURE: The transesophogeal probe was passed without difficulty through the esophogus of the patient. Sedation performed by different physician. The patient's vital signs; including heart rate, blood pressure, and oxygen saturation; remained stable throughout the procedure. The patient developed no complications during the procedure. IMPRESSIONS  1. Small PFO with positive bubble study Latter suboptimal as patiensts anesthesia was wearing off and she was trying to pull probe out.  2. Left ventricular ejection fraction, by estimation, is 60 to 65%. The left ventricle has normal function. The left ventricle has no regional wall motion abnormalities.  3. Right  ventricular systolic function is normal. The right ventricular size is normal.  4. No left atrial/left atrial appendage thrombus was detected.  5. The mitral valve is abnormal. Trivial mitral valve regurgitation. No evidence of mitral stenosis.  6. The aortic valve is normal in structure. Aortic valve regurgitation is not visualized. No aortic stenosis is present.  7. The inferior vena cava is normal in size with greater than 50% respiratory variability, suggesting right atrial pressure of 3 mmHg.  8. Evidence of atrial level shunting detected by color flow Doppler. Conclusion(s)/Recommendation(s): Normal biventricular function without evidence of hemodynamically significant valvular heart disease. FINDINGS  Left Ventricle: Left ventricular ejection fraction, by estimation, is 60 to 65%. The left ventricle has normal function. The left ventricle has no regional wall motion abnormalities. The left ventricular internal cavity size was normal in size. There is  no left ventricular hypertrophy. Right Ventricle: The right ventricular size is normal. No increase in right ventricular wall thickness. Right ventricular systolic function is normal. Left Atrium: Left atrial size was normal in size. No left atrial/left atrial appendage thrombus was detected. Right Atrium: Right atrial size was normal in size. Pericardium: There is no evidence of pericardial effusion. Mitral Valve: The mitral valve is abnormal. There is mild thickening of the mitral valve leaflet(s). There is mild calcification of the mitral valve leaflet(s). Trivial mitral valve regurgitation. No evidence of mitral valve stenosis. Tricuspid Valve: The tricuspid valve is normal in structure. Tricuspid valve regurgitation is trivial. No evidence of tricuspid stenosis. Aortic Valve: The aortic valve is normal in structure. Aortic valve regurgitation is not visualized. No aortic stenosis is present. Pulmonic Valve: The pulmonic valve was normal in structure.  Pulmonic valve regurgitation is not visualized. No evidence of pulmonic stenosis. Aorta: The aortic root is normal in size and structure. Venous: The inferior vena cava is normal in size with greater than 50% respiratory variability, suggesting right atrial pressure of 3 mmHg. IAS/Shunts: Evidence of atrial level shunting detected by color flow Doppler. Additional Comments: Small PFO with positive bubble study Latter suboptimal as patiensts anesthesia was wearing off and  she was trying to pull probe out. Jenkins Rouge MD Electronically signed by Jenkins Rouge MD Signature Date/Time: 10/30/2021/1:54:37 PM    Final         Scheduled Meds:   stroke: mapping our early stages of recovery book   Does not apply Once   acidophilus  1 capsule Oral Daily   aspirin  81 mg Oral Daily   atorvastatin  40 mg Oral Daily   calcium-vitamin D  1 tablet Oral BID   cephALEXin  500 mg Oral Q8H   clopidogrel  75 mg Oral Daily   enoxaparin (LOVENOX) injection  40 mg Subcutaneous Q24H   ferrous sulfate  325 mg Oral TID WC   megestrol  40 mg Oral BID   nicotine  21 mg Transdermal Daily   polyethylene glycol  17 g Oral BID   senna-docusate  1 tablet Oral QHS   Vitamin D (Ergocalciferol)  50,000 Units Oral Q Wed   Continuous Infusions:    Assessment & Plan:   Principal Problem:   Acute ischemic stroke [facility])   Stroke: Left middle cerebral artery branch infarct likely secondary due to embolism of cryptogenic source Please see CT/MRI result EF nml EF. No shunt, neg. Bubble LE neg dvt Goal LDL <70 A1c 4.3 On dysphagia 1 diet Avoid hormonal therapy Asa 81 and plavix 75mg  x3 month followed by asa alone PT/OT for CIR evaluation Continue neurochecks Stat CT if neuro exam changes Permissive hypertension x24 hrs EEG negative Urine drug +marij. 1/7 TEE no clot or definite cardiac source of embolism, small PFO.   Per neurology possible 17% chance that the PFO is the cause of her stroke.  Will need to follow-up  with neurology and cardiology as outpatient.  Will need to discuss PFO closure with cardiology if indicated Will need 30-day event monitor versus implantable loop recorder  Anticardiolipin antibodies negative, ANA negative plan to go to CIR     Right Upper extre cellulitis Location where IV was placed previously 1/7 erythema improving swelling going down Continue with antibiotics, arm elevation, warm compresses Still waiting for right upper extremity ultrasound to be done    Anemia Due to menorrhagia.  Patient reports being on her period currently Does have fibroids will need to follow-up GYN as outpatient 1/7 H&H stable   Marijuana use Told me she does not smoke cigarrets 1/7 marijuana cessation discussed with patient      DVT prophylaxis: lovenox Code Status:full Family Communication: None at bedside Disposition Plan: CIR Status is: Inpatient  Remains inpatient appropriate because: IV treatment, w/u pending/TEE            LOS: 5 days   Time spent: 35 minutes with more than 50% on [place], MD [facility] Pager [phone]  If 7PM-7AM, please contact night-coverage 10/31/2021, 2:51 PM

## 2021-11-01 ENCOUNTER — Inpatient Hospital Stay (HOSPITAL_COMMUNITY): Payer: BLUE CROSS/BLUE SHIELD

## 2021-11-01 DIAGNOSIS — I639 Cerebral infarction, unspecified: Secondary | ICD-10-CM | POA: Diagnosis not present

## 2021-11-01 DIAGNOSIS — I82621 Acute embolism and thrombosis of deep veins of right upper extremity: Secondary | ICD-10-CM

## 2021-11-01 LAB — CBC
HCT: 28.7 % — ABNORMAL LOW (ref 36.0–46.0)
Hemoglobin: 8.9 g/dL — ABNORMAL LOW (ref 12.0–15.0)
MCH: 27 pg (ref 26.0–34.0)
MCHC: 31 g/dL (ref 30.0–36.0)
MCV: 87 fL (ref 80.0–100.0)
Platelets: 460 10*3/uL — ABNORMAL HIGH (ref 150–400)
RBC: 3.3 MIL/uL — ABNORMAL LOW (ref 3.87–5.11)
RDW: 21.3 % — ABNORMAL HIGH (ref 11.5–15.5)
WBC: 9.8 10*3/uL (ref 4.0–10.5)
nRBC: 0.2 % (ref 0.0–0.2)

## 2021-11-01 MED ORDER — IOHEXOL 350 MG/ML SOLN
100.0000 mL | Freq: Once | INTRAVENOUS | Status: AC | PRN
  Administered 2021-11-01: 100 mL via INTRAVENOUS

## 2021-11-01 MED ORDER — APIXABAN 5 MG PO TABS
5.0000 mg | ORAL_TABLET | Freq: Two times a day (BID) | ORAL | Status: DC
  Administered 2021-11-01 – 2021-11-02 (×4): 5 mg via ORAL
  Filled 2021-11-01 (×5): qty 1

## 2021-11-01 MED ORDER — SODIUM CHLORIDE 0.9 % IV SOLN
500.0000 mg | Freq: Once | INTRAVENOUS | Status: AC
  Administered 2021-11-01: 500 mg via INTRAVENOUS
  Filled 2021-11-01: qty 25

## 2021-11-01 NOTE — Progress Notes (Signed)
STROKE TEAM PROGRESS NOTE   INTERVAL HISTORY Patient is seen in her room with her daughter. Pt lying in bed, still has mild right facial droop and right UE weakness. Mild expressive aphasia. UE venous doppler showed right radial vein DVT likely from right cephalic superficial vein. Neurology called back for antithrombotic recommendations.   Vitals:   10/31/21 2354 11/01/21 0321 11/01/21 0722 11/01/21 1354  BP: 111/78 120/85 121/89 133/88  Pulse: 98 100 97 (!) 101  Resp: 17 16 16 16   Temp: 98.8 F (37.1 C) 98.7 F (37.1 C) 98.5 F (36.9 C) 98.5 F (36.9 C)  TempSrc: Oral Oral Oral Oral  SpO2: 100% 100% 100% 100%  Weight:      Height:       CBC:  Recent Labs  Lab 10/26/21 0940 10/26/21 0948 10/28/21 0300 10/29/21 1628 11/01/21 0203  WBC 14.0*  --  10.9*  --  9.8  NEUTROABS 11.2*  --   --   --   --   HGB 7.2*   < > 7.5* 8.5* 8.9*  HCT 24.2*   < > 23.4* 26.8* 28.7*  MCV 85.5  --  82.1  --  87.0  PLT 457*  --  389  --  460*   < > = values in this interval not displayed.   Basic Metabolic Panel:  Recent Labs  Lab 10/26/21 0940 10/26/21 0948 10/31/21 0941  NA 138 138 138  K 4.0 3.9 3.6  CL 107 107 109  CO2 21*  --  19*  GLUCOSE 118* 116* 98  BUN 15 15 15   CREATININE 0.64 0.60 0.63  CALCIUM 8.8*  --  8.8*   Lipid Panel:  Recent Labs  Lab 10/27/21 0326  CHOL 145  TRIG 69  HDL 52  CHOLHDL 2.8  VLDL 14  LDLCALC 79   HgbA1c:  Recent Labs  Lab 10/27/21 0326  HGBA1C <4.2*   Urine Drug Screen:  Recent Labs  Lab 10/26/21 1239  LABOPIA NONE DETECTED  COCAINSCRNUR NONE DETECTED  LABBENZ NONE DETECTED  AMPHETMU NONE DETECTED  THCU POSITIVE*  LABBARB NONE DETECTED    Alcohol Level No results for input(s): ETH in the last 168 hours.  IMAGING past 24 hours CT Angio Chest Pulmonary Embolism (PE) W or WO Contrast  Result Date: 11/01/2021 CLINICAL DATA:  Chest pain, shortness of breath EXAM: CT ANGIOGRAPHY CHEST WITH CONTRAST TECHNIQUE: Multidetector CT  imaging of the chest was performed using the standard protocol during bolus administration of intravenous contrast. Multiplanar CT image reconstructions and MIPs were obtained to evaluate the vascular anatomy. CONTRAST:  19mL OMNIPAQUE IOHEXOL 350 MG/ML SOLN COMPARISON:  None. FINDINGS: Cardiovascular: Satisfactory opacification of the pulmonary arteries to the segmental level. No evidence of pulmonary embolism. Mild cardiomegaly. No pericardial effusion. Mediastinum/Nodes: No enlarged mediastinal, hilar, or axillary lymph nodes. Thyroid gland, trachea, and esophagus demonstrate no significant findings. Lungs/Pleura: Lungs are clear. Small pneumatocele of the left lung base (series 6, image 102). No pleural effusion or pneumothorax. Upper Abdomen: No acute abnormality. Musculoskeletal: No chest wall abnormality. No acute or significant osseous findings. Review of the MIP images confirms the above findings. IMPRESSION: 1. Negative examination for pulmonary embolism. 2. Mild cardiomegaly. Electronically Signed   By: Delanna Ahmadi M.D.   On: 11/01/2021 12:26   VAS [place] UPPER EXTREMITY VENOUS DUPLEX  Result Date: 11/01/2021 UPPER VENOUS STUDY  Patient Name:  Megan Chaney  Date of Exam:   10/31/2021 Medical Rec #: [id number]  Accession #:    [id number] Date of Birth: Dec 09, 1970        Patient Gender: F Patient Age:   44 years Exam Location:  [facility] Procedure:      VAS [place] UPPER EXTREMITY VENOUS DUPLEX Referring Phys: [facility] AMERY --------------------------------------------------------------------------------  Indications: Pain, swelling, erythema, palpable cord right arm Other Indications: Recent IV. Comparison Study: No prior upper extremity studies. Performing Technologist: Darlin Coco RDMS, RVT  Examination Guidelines: A complete evaluation includes B-mode imaging, spectral Doppler, color Doppler, and power Doppler as needed of all accessible portions of each vessel. Bilateral testing is considered  an integral part of a complete examination. Limited examinations for reoccurring indications may be performed as noted.  Right Findings: +----------+------------+---------+-----------+----------+-------+  RIGHT      Compressible Phasicity Spontaneous Properties Summary  +----------+------------+---------+-----------+----------+-------+  IJV            Full        Yes        Yes                         +----------+------------+---------+-----------+----------+-------+  Subclavian                 Yes        Yes                         +----------+------------+---------+-----------+----------+-------+  Axillary       Full                                               +----------+------------+---------+-----------+----------+-------+  Brachial       Full                                               +----------+------------+---------+-----------+----------+-------+  Radial       Partial                                      Acute   +----------+------------+---------+-----------+----------+-------+  Ulnar          Full                                               +----------+------------+---------+-----------+----------+-------+  Cephalic       None        No         No                  Acute   +----------+------------+---------+-----------+----------+-------+  Basilic        Full                                               +----------+------------+---------+-----------+----------+-------+ High bifurcation of the brachial artery with non-compressibility of a single radial vein in the upper arm as well as the forearm.  Left Findings: +----------+------------+---------+-----------+----------+-------+  LEFT  Compressible Phasicity Spontaneous Properties Summary  +----------+------------+---------+-----------+----------+-------+  Subclavian                 Yes        Yes                         +----------+------------+---------+-----------+----------+-------+  Summary:  Right: Findings consistent with acute deep vein  thrombosis involving the right radial veins. Findings consistent with acute superficial vein thrombosis involving the right cephalic vein.  *See table(s) above for measurements and observations.  Diagnosing physician: Orlie Pollen Electronically signed by Orlie Pollen on 11/01/2021 at 2:32:27 PM.    Final     PHYSICAL EXAM  Temp:  [98.5 F (36.9 C)-98.8 F (37.1 C)] 98.5 F (36.9 C) (01/08 1354) Pulse Rate:  [97-126] 101 (01/08 1354) Resp:  [14-18] 16 (01/08 1354) BP: (111-156)/(78-113) 133/88 (01/08 1354) SpO2:  [100 %] 100 % (01/08 1354)  General - Well nourished, well developed, in no apparent distress, pleasant.  Ophthalmologic - fundi not visualized due to noncooperation.  Cardiovascular - Regular rhythm and rate.  Neuro - awake, alert, eyes open, orientated to age, place, time and people. Mild expressive aphasia but following all simple commands. Able to name and repeat in slow pace. No gaze palsy, tracking bilaterally, visual field full, PERRL. Right mild facial droop. Tongue midline. LUE 5/5 and RUE proximal 4/5 and finger grip 4/5, decreased hand dexterity. Bilaterally LEs 5/5, no drift. Sensation symmetrical bilaterally, left FTN intact, R FTN ataxic but not out of proportion to weakness. Gait not tested.    ASSESSMENT/PLAN Ms. Maryalyce Sanjuan is a 51 y.o. female with no significant past medical history presenting with aphasia and right sided weakness.  She was found to have multiple acute infarcts in the left MCA territory on MRI.  Stroke: Left MCA branch infarct likely secondary due to embolism of cryptogenic source Code Stroke CT head No acute abnormality.  ASPECTS 10.    CTA head & neck No emergent LVO, ischemic range blood flow in frontal lobe, no significant atheromatous disease in the neck MRI  multifocal acute/subacute infarcts in left MCA territory 2D Echo EF 60-65%, no interatrial shunt TEE small PFO.  No clot or cardiac source of embolism TCD bubble study negative  for right-to-left shunt Lower extremity doppler no evidence of DVT UE venous doppler right radial vein DVT with right cephalic vein SVT S/p loop recorder LDL 79 HgbA1c 4.3 Hypercoagulable work up per hematology  VTE prophylaxis - lovenox No antithrombotic prior to admission, was on aspirin 81 mg and Plavix 75 mg daily. Given DVT, will switch DAPT to [place] with eliquis 5mg  bid. Once completed [facility] course, can be put back on antiplatelet therapy. Discussed with Dr. Burr Medico and Dr. Kurtis Bushman. Therapy recommendations:  CIR Disposition:  pending  DVT UE venous doppler right radial vein DVT with right cephalic vein SVT Will switch DAPT to DOAC with eliquis 5mg  bid.   Hypertension Home meds:  none Stable Long-term BP goal normotensive  Hyperlipidemia Home meds:  none LDL 79, goal < 70 Add atorvastatin 40 mg daily  Continue statin at discharge  Other Stroke Risk Factors Family hx stroke (mother, in her 10s)  Other [facility] day # 6  Neurology will sign off. Please call with questions. Pt will follow up with stroke clinic Dr .Leonie Man at [facility] in about 4 weeks. Thanks for the consult.   Rosalin Hawking, MD PhD Stroke Neurology 11/01/2021 5:25  PM    To contact Stroke Continuity provider, please refer to [email or web address] After hours, contact General Neurology

## 2021-11-01 NOTE — Plan of Care (Signed)
°  Problem: Education: Goal: Knowledge of General Education information will improve Description: Including pain rating scale, medication(s)/side effects and non-pharmacologic comfort measures Outcome: Progressing   Problem: Health Behavior/Discharge Planning: Goal: Ability to manage health-related needs will improve Outcome: Progressing   Problem: Clinical Measurements: Goal: Ability to maintain clinical measurements within normal limits will improve Outcome: Progressing Goal: Will remain free from infection Outcome: Progressing Goal: Diagnostic test results will improve Outcome: Progressing Goal: Respiratory complications will improve Outcome: Progressing Goal: Cardiovascular complication will be avoided Outcome: Progressing   Problem: Activity: Goal: Risk for activity intolerance will decrease Outcome: Progressing   Problem: Nutrition: Goal: Adequate nutrition will be maintained Outcome: Progressing   Problem: Coping: Goal: Level of anxiety will decrease Outcome: Progressing   Problem: Elimination: Goal: Will not experience complications related to bowel motility Outcome: Progressing Goal: Will not experience complications related to urinary retention Outcome: Progressing   Problem: Pain Managment: Goal: General experience of comfort will improve Outcome: Progressing   Problem: Safety: Goal: Ability to remain free from injury will improve Outcome: Progressing   Problem: Skin Integrity: Goal: Risk for impaired skin integrity will decrease Outcome: Progressing   Problem: Education: Goal: Knowledge of disease or condition will improve Outcome: Progressing Goal: Knowledge of secondary prevention will improve (SELECT ALL) Outcome: Progressing Goal: Knowledge of patient specific risk factors will improve (INDIVIDUALIZE FOR PATIENT) Outcome: Progressing Goal: Individualized Educational Video(s) Outcome: Progressing   Problem: Coping: Goal: Will verbalize  positive feelings about self Outcome: Progressing Goal: Will identify appropriate support needs Outcome: Progressing   Problem: Health Behavior/Discharge Planning: Goal: Ability to manage health-related needs will improve Outcome: Progressing   Problem: Self-Care: Goal: Ability to participate in self-care as condition permits will improve Outcome: Progressing Goal: Verbalization of feelings and concerns over difficulty with self-care will improve Outcome: Progressing Goal: Ability to communicate needs accurately will improve Outcome: Progressing   Problem: Nutrition: Goal: Risk of aspiration will decrease Outcome: Progressing

## 2021-11-01 NOTE — Progress Notes (Signed)
ANTICOAGULATION CONSULT NOTE - Initial Consult  Pharmacy Consult for enoxaparin to apixaban  Indication: DVT  Allergies  Allergen Reactions   Latex Hives and Rash   Other Hives and Rash    Powder in latex gloves    Patient Measurements: Height: 5\' 3"  (160 cm) Weight: 73.4 kg (161 lb 13.1 oz) IBW/kg (Calculated) : 52.4  Vital Signs: Temp: 98.5 F (36.9 C) (01/08 0722) Temp Source: Oral (01/08 0722) BP: 121/89 (01/08 0722) Pulse Rate: 97 (01/08 0722)  Labs: Recent Labs    10/29/21 1628 10/31/21 0941 11/01/21 0203  HGB 8.5*  --  8.9*  HCT 26.8*  --  28.7*  PLT  --   --  460*  CREATININE  --  0.63  --      Estimated Creatinine Clearance: 82.5 mL/min (by C-G formula based on SCr of 0.63 mg/dL).   Medical History: Past Medical History:  Diagnosis Date   Anemia    Fibroid    GERD (gastroesophageal reflux disease)     Assessment: 51 year old female admitted earlier this month for possible stroke. RUE ultrasound shows DVT. Patient started on full dose enoxaparin on 1/7. Pharmacy consulted to transition to apixaban therapy. Due to recent stroke, treatment team has determined no load. Current Hgb 8.9 and Plt 460. No signs of bleeding noted.   Goal of Therapy:  Anti-Xa level 0.6-1 units/ml 4hrs after LMWH dose given Monitor platelets by anticoagulation protocol: Yes   Plan:  - Start apixaban 5 MG twice daily  - Continue to monitor CBC  - Monitor for s/sx of bleed  - Educate prior to discharge   Cephus Slater, PharmD, [facility] Pharmacy Resident [phone] 11/01/2021 9:04 AM

## 2021-11-01 NOTE — Progress Notes (Addendum)
PROGRESS NOTE    Megan Chaney  QBH:[id number] DOB: 1970-12-16 DOA: 10/26/2021 PCP: Michael Boston, MD    Brief Narrative:  : Megan Chaney is a 51 y.o. female with medical history significant of chronic iron deficiency anemia secondary to fibroids and menorrhagia on hormone therapy, cigar smoker, GERD, presented with sudden onset of right-sided weakness and aphagia  1/3Neurology was consulted.  Pt family at bedside reports she is little better with speech, but overall still with expressive aphagia. Tries to get her words out  1/4 able to get more words out today compared to yesterday.She reports being on her menstrual cycle currently  1/5 able to get more words out today and lift rue more. TEE tomorrow 1/6 TEE today.  Complaining of right upper extremity swelling, erythema and pain where IV site was . 1/7 no overnight issues. Able to raise LUE more today almost above the shoulder. Able to form more words .  1/8 prelim venous US RUE with dvt. Hematology was consulted.  Consultants:  Neurology, cardiology  Procedures:   Antimicrobials:      Subjective: Pt without sob, cp, dizziness. Discussed all the results and plans moving forward with pt and her fiance  Objective: Vitals:   10/31/21 2035 10/31/21 2354 11/01/21 0321 11/01/21 0722  BP: (!) 156/113 111/78 120/85 121/89  Pulse: (!) 116 98 100 97  Resp: 18 17 16 16   Temp: 98.5 F (36.9 C) 98.8 F (37.1 C) 98.7 F (37.1 C) 98.5 F (36.9 C)  TempSrc: Oral Oral Oral Oral  SpO2: 100% 100% 100% 100%  Weight:      Height:       No intake or output data in the 24 hours ending 11/01/21 1322   Filed Weights   10/26/21 0900  Weight: 73.4 kg    Examination: Nad, calm CTA no wheezing Regular S1-S2 no gallops Soft benign positive bowel sounds No edema right upper extremity erythema improved decreased swelling less pain to palpation Awake and oriented x3 Able to move arm beyond shoulder today and finding words are  becoming easier.  Her speech is more clear today Mood and affect appropriate in current setting   Data Reviewed: I have personally reviewed following labs and imaging studies  CBC: Recent Labs  Lab 10/26/21 0940 10/26/21 0948 10/27/21 0326 10/28/21 0300 10/29/21 1628 11/01/21 0203  WBC 14.0*  --   --  10.9*  --  9.8  NEUTROABS 11.2*  --   --   --   --   --   HGB 7.2* 8.2* 7.5* 7.5* 8.5* 8.9*  HCT 24.2* 24.0* 24.5* 23.4* 26.8* 28.7*  MCV 85.5  --   --  82.1  --  87.0  PLT 457*  --   --  389  --  097*   Basic Metabolic Panel: Recent Labs  Lab 10/26/21 0940 10/26/21 0948 10/31/21 0941  NA 138 138 138  K 4.0 3.9 3.6  CL 107 107 109  CO2 21*  --  19*  GLUCOSE 118* 116* 98  BUN 15 15 15   CREATININE 0.64 0.60 0.63  CALCIUM 8.8*  --  8.8*   GFR: Estimated Creatinine Clearance: 82.5 mL/min (by C-G formula based on SCr of 0.63 mg/dL). Liver Function Tests: Recent Labs  Lab 10/26/21 0940  AST 28  ALT 25  ALKPHOS 57  BILITOT 0.4  PROT 6.5  ALBUMIN 3.7   No results for input(s): LIPASE, AMYLASE in the last 168 hours. No results for input(s): AMMONIA in the  last 168 hours. Coagulation Profile: Recent Labs  Lab 10/26/21 0940  INR 0.9   Cardiac Enzymes: No results for input(s): CKTOTAL, CKMB, CKMBINDEX, TROPONINI in the last 168 hours. BNP (last 3 results) No results for input(s): PROBNP in the last 8760 hours. HbA1C: No results for input(s): HGBA1C in the last 72 hours.  CBG: Recent Labs  Lab 10/26/21 0938  GLUCAP 103*   Lipid Profile: No results for input(s): CHOL, HDL, LDLCALC, TRIG, CHOLHDL, LDLDIRECT in the last 72 hours.  Thyroid Function Tests: No results for input(s): TSH, T4TOTAL, FREET4, T3FREE, THYROIDAB in the last 72 hours.  Anemia Panel: No results for input(s): VITAMINB12, FOLATE, FERRITIN, TIBC, IRON, RETICCTPCT in the last 72 hours.  Sepsis Labs: No results for input(s): PROCALCITON, LATICACIDVEN in the last 168 hours.  Recent  Results (from the past 240 hour(s))  Resp Panel by RT-PCR (Flu A&B, Covid) Nasopharyngeal Swab     Status: None   Collection Time: 10/27/21  3:55 PM   Specimen: Nasopharyngeal Swab; Nasopharyngeal(NP) swabs in vial transport medium  Result Value Ref Range Status   SARS Coronavirus 2 by RT PCR NEGATIVE NEGATIVE Final    Comment: (NOTE) SARS-CoV-2 target nucleic acids are NOT DETECTED.  The SARS-CoV-2 RNA is generally detectable in upper respiratory specimens during the acute phase of infection. The lowest concentration of SARS-CoV-2 viral copies this assay can detect is 138 copies/mL. A negative result does not preclude SARS-Cov-2 infection and should not be used as the sole basis for treatment or other patient management decisions. A negative result may occur with  improper specimen collection/handling, submission of specimen other than nasopharyngeal swab, presence of viral mutation(s) within the areas targeted by this assay, and inadequate number of viral copies(<138 copies/mL). A negative result must be combined with clinical observations, patient history, and epidemiological information. The expected result is Negative.  Fact Sheet for Patients:  EntrepreneurPulse.com.au  Fact Sheet for Healthcare Providers:  IncredibleEmployment.be  This test is no t yet approved or cleared by the [place] FDA and  has been authorized for detection and/or diagnosis of SARS-CoV-2 by FDA under an Emergency Use Authorization (EUA). This EUA will remain  in effect (meaning this test can be used) for the duration of the COVID-19 declaration under Section 564(b)(1) of the Act, 21 U.S.C.section 360bbb-3(b)(1), unless the authorization is terminated  or revoked sooner.       Influenza A by PCR NEGATIVE NEGATIVE Final   Influenza B by PCR NEGATIVE NEGATIVE Final    Comment: (NOTE) The Xpert Xpress SARS-CoV-2/FLU/RSV plus assay is intended as an aid in the  diagnosis of influenza from Nasopharyngeal swab specimens and should not be used as a sole basis for treatment. Nasal washings and aspirates are unacceptable for Xpert Xpress SARS-CoV-2/FLU/RSV testing.  Fact Sheet for Patients: EntrepreneurPulse.com.au  Fact Sheet for Healthcare Providers: IncredibleEmployment.be  This test is not yet approved or cleared by the [place] FDA and has been authorized for detection and/or diagnosis of SARS-CoV-2 by FDA under an Emergency Use Authorization (EUA). This EUA will remain in effect (meaning this test can be used) for the duration of the COVID-19 declaration under Section 564(b)(1) of the Act, 21 U.S.C. section 360bbb-3(b)(1), unless the authorization is terminated or revoked.  Performed at [facility], [place] [address] [place], [place] [address]          Radiology Studies: CT Angio Chest Pulmonary Embolism (PE) W or WO Contrast  Result Date: 11/01/2021 CLINICAL DATA:  Chest pain,  shortness of breath EXAM: CT ANGIOGRAPHY CHEST WITH CONTRAST TECHNIQUE: Multidetector CT imaging of the chest was performed using the standard protocol during bolus administration of intravenous contrast. Multiplanar CT image reconstructions and MIPs were obtained to evaluate the vascular anatomy. CONTRAST:  112mL OMNIPAQUE IOHEXOL 350 MG/ML SOLN COMPARISON:  None. FINDINGS: Cardiovascular: Satisfactory opacification of the pulmonary arteries to the segmental level. No evidence of pulmonary embolism. Mild cardiomegaly. No pericardial effusion. Mediastinum/Nodes: No enlarged mediastinal, hilar, or axillary lymph nodes. Thyroid gland, trachea, and esophagus demonstrate no significant findings. Lungs/Pleura: Lungs are clear. Small pneumatocele of the left lung base (series 6, image 102). No pleural effusion or pneumothorax. Upper Abdomen: No acute abnormality. Musculoskeletal: No chest wall abnormality. No acute or significant  osseous findings. Review of the MIP images confirms the above findings. IMPRESSION: 1. Negative examination for pulmonary embolism. 2. Mild cardiomegaly. Electronically Signed   By: Delanna Ahmadi M.D.   On: 11/01/2021 12:26   ECHO TEE  Result Date: 10/30/2021    TRANSESOPHOGEAL ECHO REPORT   Patient Name:   Megan Chaney Date of Exam: 10/30/2021 Medical Rec #:  [id number]       Height:       63.0 in Accession #:    [id number]      Weight:       161.8 lb Date of Birth:  1971-05-11       BSA:          1.767 m Patient Age:    31 years        BP:           142/78 mmHg Patient Gender: F               HR:           106 bpm. Exam Location:  Inpatient Procedure: Transesophageal Echo Indications:    Stroke  History:        Patient has prior history of Echocardiogram examinations.                 Stroke.  Sonographer:    Johny Chess RDCS Referring Phys: [id number] [facility] Shelena Castelluccio PROCEDURE: The transesophogeal probe was passed without difficulty through the esophogus of the patient. Sedation performed by different physician. The patient's vital signs; including heart rate, blood pressure, and oxygen saturation; remained stable throughout the procedure. The patient developed no complications during the procedure. IMPRESSIONS  1. Small PFO with positive bubble study Latter suboptimal as patiensts anesthesia was wearing off and she was trying to pull probe out.  2. Left ventricular ejection fraction, by estimation, is 60 to 65%. The left ventricle has normal function. The left ventricle has no regional wall motion abnormalities.  3. Right ventricular systolic function is normal. The right ventricular size is normal.  4. No left atrial/left atrial appendage thrombus was detected.  5. The mitral valve is abnormal. Trivial mitral valve regurgitation. No evidence of mitral stenosis.  6. The aortic valve is normal in structure. Aortic valve regurgitation is not visualized. No aortic stenosis is present.  7. The inferior vena cava is  normal in size with greater than 50% respiratory variability, suggesting right atrial pressure of 3 mmHg.  8. Evidence of atrial level shunting detected by color flow Doppler. Conclusion(s)/Recommendation(s): Normal biventricular function without evidence of hemodynamically significant valvular heart disease. FINDINGS  Left Ventricle: Left ventricular ejection fraction, by estimation, is 60 to 65%. The left ventricle has normal function. The left ventricle has no regional wall motion abnormalities. The  left ventricular internal cavity size was normal in size. There is  no left ventricular hypertrophy. Right Ventricle: The right ventricular size is normal. No increase in right ventricular wall thickness. Right ventricular systolic function is normal. Left Atrium: Left atrial size was normal in size. No left atrial/left atrial appendage thrombus was detected. Right Atrium: Right atrial size was normal in size. Pericardium: There is no evidence of pericardial effusion. Mitral Valve: The mitral valve is abnormal. There is mild thickening of the mitral valve leaflet(s). There is mild calcification of the mitral valve leaflet(s). Trivial mitral valve regurgitation. No evidence of mitral valve stenosis. Tricuspid Valve: The tricuspid valve is normal in structure. Tricuspid valve regurgitation is trivial. No evidence of tricuspid stenosis. Aortic Valve: The aortic valve is normal in structure. Aortic valve regurgitation is not visualized. No aortic stenosis is present. Pulmonic Valve: The pulmonic valve was normal in structure. Pulmonic valve regurgitation is not visualized. No evidence of pulmonic stenosis. Aorta: The aortic root is normal in size and structure. Venous: The inferior vena cava is normal in size with greater than 50% respiratory variability, suggesting right atrial pressure of 3 mmHg. IAS/Shunts: Evidence of atrial level shunting detected by color flow Doppler. Additional Comments: Small PFO with positive  bubble study Latter suboptimal as patiensts anesthesia was wearing off and she was trying to pull probe out. Jenkins Rouge MD Electronically signed by Jenkins Rouge MD Signature Date/Time: 10/30/2021/1:54:37 PM    Final    VAS [place] UPPER EXTREMITY VENOUS DUPLEX  Result Date: 10/31/2021 UPPER VENOUS STUDY  Patient Name:  Megan Chaney  Date of Exam:   10/31/2021 Medical Rec #: [id number]        Accession #:    [id number] Date of Birth: Feb 10, 1971        Patient Gender: F Patient Age:   47 years Exam Location[facility]  [facility] Procedure:      VAS [place] UPPER EXTREMITY VENOUS DUPLEX Referring Phys: Gwynneth Albright [facility] --------------------------------------------------------------------------------  Indications: Pain, swelling, erythema, palpable cord right arm Other Indications: Recent IV. Comparison Study: No prior upper extremity studies. Performing Technologist: Darlin Coco RDMS, RVT  Examination Guidelines: A complete evaluation includes B-mode imaging, spectral Doppler, color Doppler, and power Doppler as needed of all accessible portions of each vessel. Bilateral testing is considered an integral part of a complete examination. Limited examinations for reoccurring indications may be performed as noted.  Right Findings: +----------+------------+---------+-----------+----------+-------+  RIGHT      Compressible Phasicity Spontaneous Properties Summary  +----------+------------+---------+-----------+----------+-------+  IJV            Full        Yes        Yes                         +----------+------------+---------+-----------+----------+-------+  Subclavian                 Yes        Yes                         +----------+------------+---------+-----------+----------+-------+  Axillary       Full                                               +----------+------------+---------+-----------+----------+-------+  Brachial  Full                                                +----------+------------+---------+-----------+----------+-------+  Radial       Partial                                      Acute   +----------+------------+---------+-----------+----------+-------+  Ulnar          Full                                               +----------+------------+---------+-----------+----------+-------+  Cephalic       None        No         No                  Acute   +----------+------------+---------+-----------+----------+-------+  Basilic        Full                                               +----------+------------+---------+-----------+----------+-------+ High bifurcation of the brachial artery with non-compressibility of a single radial vein in the upper arm as well as the forearm.  Left Findings: +----------+------------+---------+-----------+----------+-------+  LEFT       Compressible Phasicity Spontaneous Properties Summary  +----------+------------+---------+-----------+----------+-------+  Subclavian                 Yes        Yes                         +----------+------------+---------+-----------+----------+-------+  Summary:  Right: Findings consistent with acute deep vein thrombosis involving the right radial veins. Findings consistent with acute superficial vein thrombosis involving the right cephalic vein.  *See table(s) above for measurements and observations.    Preliminary         Scheduled Meds:   stroke: mapping our early stages of recovery book   Does not apply Once   acidophilus  1 capsule Oral Daily   apixaban  5 mg Oral BID   atorvastatin  40 mg Oral Daily   calcium-vitamin D  1 tablet Oral BID   cephALEXin  500 mg Oral Q8H   ferrous sulfate  325 mg Oral TID WC   megestrol  40 mg Oral BID   metoprolol succinate  12.5 mg Oral Daily   nicotine  21 mg Transdermal Daily   polyethylene glycol  17 g Oral BID   senna-docusate  1 tablet Oral QHS   Vitamin D (Ergocalciferol)  50,000 Units Oral Q Wed   Continuous Infusions:    Assessment  & Plan:   Principal Problem:   Acute ischemic stroke [facility])   Stroke: Left middle cerebral artery branch infarct likely secondary due to embolism of cryptogenic source Please see CT/MRI result EF nml EF. No shunt, neg. Bubble LE neg dvt Goal LDL <70 A1c 4.3 On dysphagia 1 diet Avoid hormonal therapy Continue neurochecks Stat CT if neuro exam changes Permissive hypertension x24  hrs EEG negative Urine drug +marij. 1/7 TEE no clot or definite cardiac source of embolism, small PFO.   Per neurology possible 17% chance that the PFO is the cause of her stroke.  Will need to follow-up with neurology and cardiology as outpatient.  Will need to discuss PFO closure with cardiology if indicated Needs 30-day event monitor versus implantable loop recorder  Anticardiolipin antibodies negative, ANA negative 1/8 plan to go to CIR Currently on Plavix and aspirin but due to right upper extremity DVT, discussed with Dr. Erlinda Hong neurology and he recommended to stop DAPT and just continue on Eliquis with no loading dose. Discussed this with hematology.      Right Upper extre cellulitis Location where IV was placed previously 1/8 erythema swelling slowly improving Venous ultrasound preliminary studies reveals acute DVT of the right radial vein.  Acute superficial vein thrombosis involving right cephalic vein. Hematology consulted, input was appreciated.  With her recent acute embolic stroke without known source of thrombosis, small PFO hematology recommends anticoagulation with Xarelto or Eliquis for 3 months  Will need to follow-up with Dr. Annamaria Boots hematology as outpatient for hypercoagulopathy work-up when she is off of anticoagulation.   Initially started on lovenox bid, will transition to Eliquis.  Neurology recommends no loading dose and to stop Plavix and aspirin   Sinus tachycardia Since admission on and off. Etiology unclear. Will start on low dose beta blk which also will help with bp 02 sat  stable, but even though RUE dvt from iv site, with her tachycardia would like to obtain cta r/o PE.    Iron deficiency anemia Due to menorrhagia.  Patient reports being on her period currently Does have fibroids will need to follow-up GYN as outpatient 1/8 H&H stable Will give one more dose iv venofer 400-500mg .    Marijuana use Told me she does not smoke cigarrets marijuana cessation discussed with patient      DVT prophylaxis: lovenox Code Status:full Family Communication: None at bedside Disposition Plan: CIR Status is: Inpatient  Remains inpatient appropriate because: IV treatment, w/u pending/TEE  Discussed coc about cva and DVT treatment with neurology and hematology .          LOS: 6 days   Time spent: 45 minutes with more than 50% on [place], MD [facility] Pager [phone]  If 7PM-7AM, please contact night-coverage 11/01/2021, 1:22 PM

## 2021-11-01 NOTE — Final Consult Note (Signed)
[place]  [phone]   HEMATOLOGY ONCOLOGY INPATIENT CONSULTATION   Lichelle Viets  DOB: 1971/08/21  MR#: [id number]  CSN#: [id number]    Requesting Physician: [facility] Dr. Kurtis Bushman   Patient Care Team: Michael Boston, MD as PCP - General (Internal Medicine)  Reason for consult: right UE DVT and acute stroke   History of present illness:   Ms. Cawthorn is a 51 year old female, with family history of stroke (mother had stoke in her 38), iron deficient anemia, but otherwise healthy, presented acute stroke symptoms to emergency room on October 26, 2021.  Work-up showed left middle cerebral artery branch infarct secondary to embolism of cardiogenic source.  Cardiac work-up was negative except a small PFO.  Lower extremity DVT was negative by Doppler.  She developed right upper extremity edema and pain yesterday, after IV access, and Doppler confirmed acute deep vein thrombosis in the right radial vein.   MEDICAL HISTORY:  Past Medical History:  Diagnosis Date   Anemia    Fibroid    GERD (gastroesophageal reflux disease)     SURGICAL HISTORY: Past Surgical History:  Procedure Laterality Date   MULTIPLE TOOTH EXTRACTIONS     NO PAST SURGERIES      SOCIAL HISTORY: Social History   Socioeconomic History   Marital status: Single    Spouse name: Not on file   Number of children: Not on file   Years of education: Not on file   Highest education level: Not on file  Occupational History   Not on file  Tobacco Use   Smoking status: Former    Types: Cigarettes    Quit date: 07/29/1999    Years since quitting: 22.2   Smokeless tobacco: Never  Vaping Use   Vaping Use: Never used  Substance and Sexual Activity   Alcohol use: Yes   Drug use: Not Currently    Types: Marijuana    Comment: Quit in July 2019   Sexual activity: Yes    Birth control/protection: None  Other Topics Concern   Not on file  Social History Narrative   Not on file    Social Determinants of Health   Financial Resource Strain: Not on file  Food Insecurity: Not on file  Transportation Needs: Not on file  Physical Activity: Not on file  Stress: Not on file  Social Connections: Not on file  Intimate Partner Violence: Not on file    FAMILY HISTORY: Family History  Problem Relation Age of Onset   Chronic Renal Failure Father    Miscarriages / Stillbirths Father    Heart attack Mother    Stroke Mother    Cancer Mother    Diabetes Mother     ALLERGIES:  is allergic to latex and other.  MEDICATIONS:  Current Facility-Administered Medications  Medication Dose Route Frequency Provider Last Rate Last Admin    stroke: mapping our early stages of recovery book   Does not apply Once Josue Hector, MD       acetaminophen (TYLENOL) tablet 650 mg  650 mg Oral Q4H PRN Josue Hector, MD   650 mg at 10/31/21 0001   Or   acetaminophen (TYLENOL) 160 MG/5ML solution 650 mg  650 mg Per Tube Q4H PRN Josue Hector, MD       Or   acetaminophen (TYLENOL) suppository 650 mg  650 mg Rectal Q4H PRN Josue Hector, MD       acidophilus (RISAQUAD) capsule 1 capsule  1 capsule Oral Daily Josue Hector, MD   1 capsule at 11/01/21 1105   apixaban (ELIQUIS) tablet 5 mg  5 mg Oral BID Llana Aliment, RPH   5 mg at 11/01/21 1104   atorvastatin (LIPITOR) tablet 40 mg  40 mg Oral Daily Josue Hector, MD   40 mg at 11/01/21 1007   calcium-vitamin D (OSCAL WITH D) 500-5 MG-MCG per tablet 1 tablet  1 tablet Oral BID Josue Hector, MD   1 tablet at 11/01/21 1007   cephALEXin (KEFLEX) capsule 500 mg  500 mg Oral Q8H Josue Hector, MD   500 mg at 11/01/21 4166   ferrous sulfate tablet 325 mg  325 mg Oral TID WC Josue Hector, MD   325 mg at 11/01/21 1006   megestrol (MEGACE) tablet 40 mg  40 mg Oral BID Josue Hector, MD   40 mg at 11/01/21 1105   metoprolol succinate (TOPROL-XL) 24 hr tablet 12.5 mg  12.5 mg Oral Daily Nolberto Hanlon, MD   12.5 mg at 11/01/21 1006    nicotine (NICODERM CQ - dosed in mg/24 hours) patch 21 mg  21 mg Transdermal Daily Josue Hector, MD       polyethylene glycol (MIRALAX / GLYCOLAX) packet 17 g  17 g Oral BID Nolberto Hanlon, MD   17 g at 11/01/21 1007   senna-docusate (Senokot-S) tablet 1 tablet  1 tablet Oral QHS Nolberto Hanlon, MD   1 tablet at 10/31/21 1256   Vitamin D (Ergocalciferol) (DRISDOL) capsule 50,000 Units  50,000 Units Oral Q Doyce Para, MD   50,000 Units at 10/28/21 [phone]    REVIEW OF SYSTEMS:   Constitutional: Denies fevers, chills or abnormal night sweats Eyes: Denies blurriness of vision, double vision or watery eyes Ears, nose, mouth, throat, and face: Denies mucositis or sore throat Respiratory: Denies cough, dyspnea or wheezes Cardiovascular: Denies palpitation, chest discomfort or lower extremity swelling Gastrointestinal:  Denies nausea, heartburn or change in bowel habits Skin: Denies abnormal skin rashes Lymphatics: Denies new lymphadenopathy or easy bruising Neurological:Denies numbness, tingling or new weaknesses Behavioral/Psych: Mood is stable, no new changes  All other systems were reviewed with the patient and are negative.  PHYSICAL EXAMINATION: ECOG PERFORMANCE STATUS: 1 - Symptomatic but completely ambulatory  Vitals:   11/01/21 0321 11/01/21 0722  BP: 120/85 121/89  Pulse: 100 97  Resp: 16 16  Temp: 98.7 F (37.1 C) 98.5 F (36.9 C)  SpO2: 100% 100%   Filed Weights   10/26/21 0900  Weight: 161 lb 13.1 oz (73.4 kg)    GENERAL:alert, no distress and comfortable SKIN: skin color, texture, turgor are normal, no rashes or significant lesions EYES: normal, conjunctiva are pink and non-injected, sclera clear LUNGS: clear to auscultation and percussion with normal breathing effort HEART: regular rate & rhythm and no murmurs and no lower extremity edema ABDOMEN:abdomen soft, non-tender and normal bowel sounds Musculoskeletal:no cyanosis of digits and no clubbing, mild  right UE edema and tenderness     LABORATORY DATA:  I have reviewed the data as listed Lab Results  Component Value Date   WBC 9.8 11/01/2021   HGB 8.9 (L) 11/01/2021   HCT 28.7 (L) 11/01/2021   MCV 87.0 11/01/2021   PLT 460 (H) 11/01/2021   Recent Labs    10/26/21 0940 10/26/21 0948 10/31/21 0941  NA 138 138 138  K 4.0 3.9 3.6  CL 107 107 109  CO2 21*  --  19*  GLUCOSE 118* 116* 98  BUN 15 15 15   CREATININE 0.64 0.60 0.63  CALCIUM 8.8*  --  8.8*  GFRNONAA >60  --  >60  PROT 6.5  --   --   ALBUMIN 3.7  --   --   AST 28  --   --   ALT 25  --   --   ALKPHOS 57  --   --   BILITOT 0.4  --   --     RADIOGRAPHIC STUDIES: I have personally reviewed the radiological images as listed and agreed with the findings in the report. MR BRAIN WO CONTRAST  Result Date: 10/26/2021 CLINICAL DATA:  Neuro deficit, acute, stroke suspected. Right-sided weakness. EXAM: MRI HEAD WITHOUT CONTRAST TECHNIQUE: Multiplanar, multiecho pulse sequences of the brain and surrounding structures were obtained without intravenous contrast. COMPARISON:  CT head without contrast. CT perfusion. CTA head and neck 10/26/2021. FINDINGS: Brain: Multifocal areas restricted diffusion are present in the left MCA territory, predominantly inferior and posteriorly. There is some involvement of the left middle temporal gyrus. Cortical involvement is present in the posterior left parietal lobe as well as the more superior left parietal lobe. Areas of cortical infarction are present in the anterior left frontal lobe involving the middle and inferior frontal cortex. No acute hemorrhage present. The study is moderately degraded by patient motion. Right hemisphere is within normal limits. The ventricles are of normal size. No significant extraaxial fluid collection is present. The internal auditory canals are within normal limits. The brainstem and cerebellum are within normal limits. Vascular: Flow is present in the major  intracranial arteries. Skull and upper cervical spine: The craniocervical junction is normal. Upper cervical spine is within normal limits. Marrow signal is unremarkable. Sinuses/Orbits: The paranasal sinuses and mastoid air cells are clear. The globes and orbits are within normal limits. IMPRESSION: 1. Multifocal acute/subacute nonhemorrhagic infarcts involving the left MCA territory. Findings are predominantly inferior and posteriorly. There is cortical infarction also involves the more anterior middle and inferior frontal lobes. 2. No acute hemorrhage. These results were called by telephone at the time of interpretation on 10/26/2021 at 2:52 pm to provider [facility] , who verbally acknowledged these results. Electronically Signed   By: San Morelle M.D.   On: 10/26/2021 14:54   CT C-SPINE NO CHARGE  Result Date: 10/26/2021 CLINICAL DATA:  Trauma, found down EXAM: CT CERVICAL SPINE WITHOUT CONTRAST TECHNIQUE: Multidetector CT imaging of the cervical spine was performed without intravenous contrast. Multiplanar CT image reconstructions were also generated. COMPARISON:  None. FINDINGS: Alignment: Alignment of posterior margins of vertebral bodies is unremarkable. Degenerative changes are noted with bony spurs at C4-C5 and C5-C6 levels. Skull base and vertebrae: No recent fracture is seen. Soft tissues and spinal canal: There is no significant spinal stenosis. Prevertebral soft tissues are unremarkable. Disc levels: There is mild encroachment of neural foramina at C4-C5 and C5-C6 levels anterior bony spurs seen in the upper thoracic spine. Upper chest: Unremarkable. Other: Thyroid is enlarged in size. IMPRESSION: No recent fracture is seen in the cervical spine. Alignment of posterior margins of vertebral bodies is unremarkable. Cervical spondylosis with mild encroachment of neural foramina at C4-C5 and C5-C6 levels. Electronically Signed   By: Elmer Picker M.D.   On: 10/26/2021 11:19   EEG  adult  Result Date: 10/27/2021 Lora Havens, MD     10/27/2021  7:42 AM Patient Name: Jordis Repetto MRN: [id number] Epilepsy Attending: Lora Havens Referring Physician/Provider: Dr  Su Monks Date 10/26/2021 Duration: 21.09 mins Patient history: 51 year old woman with a past medical his history significant for fibroids who presented as a pretty activated stroke code with EMS for aphasia and right-sided weakness c/f acute ischemic stroke. EEG to evaluate for seizure Level of alertness: Awake AEDs during EEG study: None Technical aspects: This EEG study was done with scalp electrodes positioned according to the 10-20 International system of electrode placement. Electrical activity was acquired at a sampling rate of 500Hz  and reviewed with a high frequency filter of 70Hz  and a low frequency filter of 1Hz . EEG data were recorded continuously and digitally stored. Description: The posterior dominant rhythm consists of 10 Hz activity of moderate voltage (25-35 uV) seen predominantly in posterior head regions, asymmetric (left<right) and reactive to eye opening and eye closing.  Hyperventilation and photic stimulation were not performed.   ABNORMALITY - Background asymmetry, left<right IMPRESSION: This study is suggestive of cortical dysfunction arising from left occipital region likely secondary to underlying structural abnormality/stroke. No seizures or epileptiform discharges were seen throughout the recording. Priyanka [name]   Overnight EEG with video  Result Date: 10/27/2021 Lora Havens, MD     10/27/2021  9:47 AM Patient Name: Mihika Surrette MRN: [id number] Epilepsy Attending: Lora Havens Referring Physician/Provider: Dr Su Monks Duration: 10/26/2021 0630 to 10/27/2021 0848  Patient history: 51 year old woman with a past medical his history significant for fibroids who presented as a pretty activated stroke code with EMS for aphasia and right-sided weakness c/f acute ischemic stroke. EEG to  evaluate for seizure  Level of alertness: Awake, asleep  AEDs during EEG study: None  Technical aspects: This EEG study was done with scalp electrodes positioned according to the 10-20 International system of electrode placement. Electrical activity was acquired at a sampling rate of 500Hz  and reviewed with a high frequency filter of 70Hz  and a low frequency filter of 1Hz . EEG data were recorded continuously and digitally stored.  Description: The posterior dominant rhythm consists of 10 Hz activity of moderate voltage (25-35 uV) seen predominantly in posterior head regions, asymmetric (left<right) and reactive to eye opening and eye closing.  Hyperventilation and photic stimulation were not performed.    ABNORMALITY - Background asymmetry, left<right  IMPRESSION: This study is suggestive of cortical dysfunction arising from left occipital region likely secondary to underlying structural abnormality/stroke. No seizures or epileptiform discharges were seen throughout the recording.  Priyanka O Yadav   VAS [place] TRANSCRANIAL DOPPLER W BUBBLES  Result Date: 10/28/2021  Transcranial Doppler with Bubble Patient Name:  RICARDO KAYES  Date of Exam:   10/27/2021 Medical Rec #: [id number]        Accession #:    [id number] Date of Birth: [birth year]        Patient Gender: F Patient Age:   29 years Exam Location:  [facility] Procedure:      VAS [place] TRANSCRANIAL DOPPLER W BUBBLES Referring Phys: Elwin Sleight DE LA TORRE --------------------------------------------------------------------------------  Indications: Stroke. Comparison Study: No prior studies. Performing Technologist: Darlin Coco RDMS, RVT  Examination Guidelines: A complete evaluation includes B-mode imaging, spectral Doppler, color Doppler, and power Doppler as needed of all accessible portions of each vessel. Bilateral testing is considered an integral part of a complete examination. Limited examinations for reoccurring indications may be performed as noted.   Summary: No HITS at rest or during Valsalva. Negative transcranial Doppler Bubble study with no evidence of right to left intracardiac communication.  Negative TCD Bubble study *See table(s) above  for TCD measurements and observations.  Diagnosing physician: Antony Contras MD Electronically signed by Antony Contras MD on 10/28/2021 at 1:18:58 PM.    Final    ECHO TEE  Result Date: 10/30/2021    TRANSESOPHOGEAL ECHO REPORT   Patient Name:   AJLA MCGEACHY Date of Exam: 10/30/2021 Medical Rec #:  [id number]       Height:       63.0 in Accession #:    [id number]      Weight:       161.8 lb Date of Birth:  [birth year]       BSA:          1.767 m Patient Age:    47 years        BP:           142/78 mmHg Patient Gender: F               HR:           106 bpm. Exam Location:  Inpatient Procedure: Transesophageal Echo Indications:    Stroke  History:        Patient has prior history of Echocardiogram examinations.                 Stroke.  Sonographer:    Johny Chess RDCS Referring Phys: [id number] [facility] AMERY PROCEDURE: The transesophogeal probe was passed without difficulty through the esophogus of the patient. Sedation performed by different physician. The patient's vital signs; including heart rate, blood pressure, and oxygen saturation; remained stable throughout the procedure. The patient developed no complications during the procedure. IMPRESSIONS  1. Small PFO with positive bubble study Latter suboptimal as patiensts anesthesia was wearing off and she was trying to pull probe out.  2. Left ventricular ejection fraction, by estimation, is 60 to 65%. The left ventricle has normal function. The left ventricle has no regional wall motion abnormalities.  3. Right ventricular systolic function is normal. The right ventricular size is normal.  4. No left atrial/left atrial appendage thrombus was detected.  5. The mitral valve is abnormal. Trivial mitral valve regurgitation. No evidence of mitral stenosis.  6. The aortic valve  is normal in structure. Aortic valve regurgitation is not visualized. No aortic stenosis is present.  7. The inferior vena cava is normal in size with greater than 50% respiratory variability, suggesting right atrial pressure of 3 mmHg.  8. Evidence of atrial level shunting detected by color flow Doppler. Conclusion(s)/Recommendation(s): Normal biventricular function without evidence of hemodynamically significant valvular heart disease. FINDINGS  Left Ventricle: Left ventricular ejection fraction, by estimation, is 60 to 65%. The left ventricle has normal function. The left ventricle has no regional wall motion abnormalities. The left ventricular internal cavity size was normal in size. There is  no left ventricular hypertrophy. Right Ventricle: The right ventricular size is normal. No increase in right ventricular wall thickness. Right ventricular systolic function is normal. Left Atrium: Left atrial size was normal in size. No left atrial/left atrial appendage thrombus was detected. Right Atrium: Right atrial size was normal in size. Pericardium: There is no evidence of pericardial effusion. Mitral Valve: The mitral valve is abnormal. There is mild thickening of the mitral valve leaflet(s). There is mild calcification of the mitral valve leaflet(s). Trivial mitral valve regurgitation. No evidence of mitral valve stenosis. Tricuspid Valve: The tricuspid valve is normal in structure. Tricuspid valve regurgitation is trivial. No evidence of tricuspid stenosis. Aortic Valve: The aortic valve is normal in structure. Aortic  valve regurgitation is not visualized. No aortic stenosis is present. Pulmonic Valve: The pulmonic valve was normal in structure. Pulmonic valve regurgitation is not visualized. No evidence of pulmonic stenosis. Aorta: The aortic root is normal in size and structure. Venous: The inferior vena cava is normal in size with greater than 50% respiratory variability, suggesting right atrial pressure of 3  mmHg. IAS/Shunts: Evidence of atrial level shunting detected by color flow Doppler. Additional Comments: Small PFO with positive bubble study Latter suboptimal as patiensts anesthesia was wearing off and she was trying to pull probe out. Jenkins Rouge MD Electronically signed by Jenkins Rouge MD Signature Date/Time: 10/30/2021/1:54:37 PM    Final    ECHOCARDIOGRAM COMPLETE BUBBLE STUDY  Result Date: 10/27/2021    ECHOCARDIOGRAM REPORT   Patient Name:   LITSY EPTING Date of Exam: 10/27/2021 Medical Rec #:  [id number]       Height:       63.0 in Accession #:    [id number]      Weight:       161.8 lb Date of Birth:  Jul 23, 1971       BSA:          1.767 m Patient Age:    32 years        BP:           132/98 mmHg Patient Gender: F               HR:           97 bpm. Exam Location:  Inpatient Procedure: 2D Echo, Cardiac Doppler, Color Doppler and Intracardiac            Opacification Agent Indications:    Stroke, bubble  History:        Patient has no prior history of Echocardiogram examinations.  Sonographer:    Glo Herring Referring Phys: [place]  1. Left ventricular ejection fraction, by estimation, is 60 to 65%. The left ventricle has normal function. The left ventricle has no regional wall motion abnormalities. Left ventricular diastolic parameters were normal.  2. Right ventricular systolic function is normal. The right ventricular size is normal.  3. The mitral valve is normal in structure. No evidence of mitral valve regurgitation. No evidence of mitral stenosis.  4. The aortic valve is tricuspid. Aortic valve regurgitation is not visualized. No aortic stenosis is present.  5. The inferior vena cava is normal in size with greater than 50% respiratory variability, suggesting right atrial pressure of 3 mmHg.  6. Agitated saline contrast bubble study was negative, with no evidence of any interatrial shunt. FINDINGS  Left Ventricle: Left ventricular ejection fraction, by estimation, is 60  to 65%. The left ventricle has normal function. The left ventricle has no regional wall motion abnormalities. The left ventricular internal cavity size was normal in size. There is  no left ventricular hypertrophy. Left ventricular diastolic parameters were normal. Right Ventricle: The right ventricular size is normal. No increase in right ventricular wall thickness. Right ventricular systolic function is normal. Left Atrium: Left atrial size was normal in size. Right Atrium: Right atrial size was normal in size. Pericardium: There is no evidence of pericardial effusion. Mitral Valve: The mitral valve is normal in structure. No evidence of mitral valve regurgitation. No evidence of mitral valve stenosis. Tricuspid Valve: The tricuspid valve is normal in structure. Tricuspid valve regurgitation is trivial. No evidence of tricuspid stenosis. Aortic Valve: The aortic valve is tricuspid. Aortic valve regurgitation  is not visualized. No aortic stenosis is present. Aortic valve mean gradient measures 5.0 mmHg. Aortic valve peak gradient measures 8.2 mmHg. Aortic valve area, by VTI measures 1.74 cm. Pulmonic Valve: The pulmonic valve was not well visualized. Pulmonic valve regurgitation is trivial. No evidence of pulmonic stenosis. Aorta: The aortic root is normal in size and structure. Venous: The inferior vena cava is normal in size with greater than 50% respiratory variability, suggesting right atrial pressure of 3 mmHg. IAS/Shunts: No atrial level shunt detected by color flow Doppler. Agitated saline contrast was given intravenously to evaluate for intracardiac shunting. Agitated saline contrast bubble study was negative, with no evidence of any interatrial shunt.  LEFT VENTRICLE PLAX 2D LVOT diam:     2.00 cm LV SV:         46 LV SV Index:   26 LVOT Area:     3.14 cm  IVC IVC diam: 1.30 cm LEFT ATRIUM           Index LA Vol (A2C): 31.9 ml 18.05 ml/m LA Vol (A4C): 48.3 ml 27.33 ml/m  AORTIC VALVE                      PULMONIC VALVE AV Area (Vmax):    1.83 cm      PV Vmax:       0.99 m/s AV Area (Vmean):   1.60 cm      PV Peak grad:  3.9 mmHg AV Area (VTI):     1.74 cm AV Vmax:           143.00 cm/s AV Vmean:          102.000 cm/s AV VTI:            0.265 m AV Peak Grad:      8.2 mmHg AV Mean Grad:      5.0 mmHg LVOT Vmax:         83.30 cm/s LVOT Vmean:        51.900 cm/s LVOT VTI:          0.147 m LVOT/AV VTI ratio: 0.55  AORTA Ao Root diam: 2.90 cm Ao Asc diam:  2.90 cm  SHUNTS Systemic VTI:  0.15 m Systemic Diam: 2.00 cm Kardie Tobb DO Electronically signed by Berniece Salines DO Signature Date/Time: 10/27/2021/3:32:16 PM    Final    CT HEAD CODE STROKE WO CONTRAST  Result Date: 10/26/2021 CLINICAL DATA:  Code stroke.  Right-sided weakness EXAM: CT HEAD WITHOUT CONTRAST TECHNIQUE: Contiguous axial images were obtained from the base of the skull through the vertex without intravenous contrast. COMPARISON:  None. FINDINGS: Brain: No evidence of acute infarction, hemorrhage, hydrocephalus, extra-axial collection or mass lesion/mass effect. Vascular: No hyperdense vessel or unexpected calcification. Skull: Normal. Negative for fracture or focal lesion. Sinuses/Orbits: No acute finding. Other: These results were communicated to [facility] at 10:00 am on 10/26/2021 by text page via the [facility] messaging system. ASPECTS [facility] Stroke Program Early CT Score) - Ganglionic level infarction (caudate, lentiform nuclei, internal capsule, insula, M1-M3 cortex): 7 - Supraganglionic infarction (M4-M6 cortex): 3 Total score (0-10 with 10 being normal): 10 IMPRESSION: No acute finding.  ASPECTS is 10. Electronically Signed   By: Jorje Guild M.D.   On: 10/26/2021 10:01   VAS [place] LOWER EXTREMITY VENOUS (DVT)  Result Date: 10/27/2021  Lower Venous DVT Study Patient Name:  MIATA CULBRETH  Date of Exam:   10/27/2021 Medical Rec #: [id number]  Accession #:    [id number] Date of Birth: 08-03-1971        Patient Gender: F Patient Age:   47 years Exam  Location:  [facility] Procedure:      VAS [place] LOWER EXTREMITY VENOUS (DVT) Referring Phys: Elwin Sleight DE LA TORRE --------------------------------------------------------------------------------  Indications: Stroke.  Comparison Study: No prior studies. Performing Technologist: Darlin Coco RDMS, RVT  Examination Guidelines: A complete evaluation includes B-mode imaging, spectral Doppler, color Doppler, and power Doppler as needed of all accessible portions of each vessel. Bilateral testing is considered an integral part of a complete examination. Limited examinations for reoccurring indications may be performed as noted. The reflux portion of the exam is performed with the patient in reverse Trendelenburg.  +---------+---------------+---------+-----------+----------+--------------+  RIGHT     Compressibility Phasicity Spontaneity Properties Thrombus Aging  +---------+---------------+---------+-----------+----------+--------------+  CFV       Full            Yes       Yes                                    +---------+---------------+---------+-----------+----------+--------------+  SFJ       Full                                                             +---------+---------------+---------+-----------+----------+--------------+  FV Prox   Full                                                             +---------+---------------+---------+-----------+----------+--------------+  FV Mid    Full                                                             +---------+---------------+---------+-----------+----------+--------------+  FV Distal Full                                                             +---------+---------------+---------+-----------+----------+--------------+  PFV       Full                                                             +---------+---------------+---------+-----------+----------+--------------+  POP       Full            Yes       Yes                                     +---------+---------------+---------+-----------+----------+--------------+  PTV       Full                                                             +---------+---------------+---------+-----------+----------+--------------+  PERO      Full                                                             +---------+---------------+---------+-----------+----------+--------------+   +---------+---------------+---------+-----------+----------+--------------+  LEFT      Compressibility Phasicity Spontaneity Properties Thrombus Aging  +---------+---------------+---------+-----------+----------+--------------+  CFV       Full            Yes       Yes                                    +---------+---------------+---------+-----------+----------+--------------+  SFJ       Full                                                             +---------+---------------+---------+-----------+----------+--------------+  FV Prox   Full                                                             +---------+---------------+---------+-----------+----------+--------------+  FV Mid    Full                                                             +---------+---------------+---------+-----------+----------+--------------+  FV Distal Full                                                             +---------+---------------+---------+-----------+----------+--------------+  PFV       Full                                                             +---------+---------------+---------+-----------+----------+--------------+  POP       Full            Yes       Yes                                    +---------+---------------+---------+-----------+----------+--------------+  PTV       Full                                                             +---------+---------------+---------+-----------+----------+--------------+  PERO      Full                                                              +---------+---------------+---------+-----------+----------+--------------+     Summary: RIGHT: - There is no evidence of deep vein thrombosis in the lower extremity.  - No cystic structure found in the popliteal fossa.  LEFT: - There is no evidence of deep vein thrombosis in the lower extremity.  - No cystic structure found in the popliteal fossa.  *See table(s) above for measurements and observations. Electronically signed by Deitra Mayo MD on 10/27/2021 at 5:57:09 PM.    Final    VAS [place] UPPER EXTREMITY VENOUS DUPLEX  Result Date: 10/31/2021 UPPER VENOUS STUDY  Patient Name:  LILYBETH VIEN  Date of Exam:   10/31/2021 Medical Rec #: [id number]        Accession #:    [id number] Date of Birth: 1970/12/11        Patient Gender: F Patient Age:   65 years Exam Location:  [facility] Procedure:      VAS [place] UPPER EXTREMITY VENOUS DUPLEX Referring Phys: Gwynneth Albright [facility] --------------------------------------------------------------------------------  Indications: Pain, swelling, erythema, palpable cord right arm Other Indications: Recent IV. Comparison Study: No prior upper extremity studies. Performing Technologist: Darlin Coco RDMS, RVT  Examination Guidelines: A complete evaluation includes B-mode imaging, spectral Doppler, color Doppler, and power Doppler as needed of all accessible portions of each vessel. Bilateral testing is considered an integral part of a complete examination. Limited examinations for reoccurring indications may be performed as noted.  Right Findings: +----------+------------+---------+-----------+----------+-------+  RIGHT      Compressible Phasicity Spontaneous Properties Summary  +----------+------------+---------+-----------+----------+-------+  IJV            Full        Yes        Yes                         +----------+------------+---------+-----------+----------+-------+  Subclavian                 Yes        Yes                          +----------+------------+---------+-----------+----------+-------+  Axillary       Full                                               +----------+------------+---------+-----------+----------+-------+  Brachial       Full                                               +----------+------------+---------+-----------+----------+-------+  Radial       Partial                                      Acute   +----------+------------+---------+-----------+----------+-------+  Ulnar          Full                                               +----------+------------+---------+-----------+----------+-------+  Cephalic       None        No         No                  Acute   +----------+------------+---------+-----------+----------+-------+  Basilic        Full                                               +----------+------------+---------+-----------+----------+-------+ High bifurcation of the brachial artery with non-compressibility of a single radial vein in the upper arm as well as the forearm.  Left Findings: +----------+------------+---------+-----------+----------+-------+  LEFT       Compressible Phasicity Spontaneous Properties Summary  +----------+------------+---------+-----------+----------+-------+  Subclavian                 Yes        Yes                         +----------+------------+---------+-----------+----------+-------+  Summary:  Right: Findings consistent with acute deep vein thrombosis involving the right radial veins. Findings consistent with acute superficial vein thrombosis involving the right cephalic vein.  *See table(s) above for measurements and observations.    Preliminary    CT ANGIO HEAD NECK W WO CM W PERF (CODE STROKE)  Result Date: 10/26/2021 CLINICAL DATA:  Right-sided weakness EXAM: CT ANGIOGRAPHY HEAD AND NECK CT PERFUSION BRAIN TECHNIQUE: Multidetector CT imaging of the head and neck was performed using the standard protocol during bolus administration of intravenous contrast.  Multiplanar CT image reconstructions and MIPs were obtained to evaluate the vascular anatomy. Carotid stenosis measurements (when applicable) are obtained utilizing NASCET criteria, using the distal internal carotid diameter as the denominator. Multiphase CT imaging of the brain was performed following IV bolus contrast injection. Subsequent parametric perfusion maps were calculated using RAPID software. CONTRAST:  Dose is not known on this in progress study COMPARISON:  None. FINDINGS: CTA NECK FINDINGS Aortic arch: Normal Right carotid system: Vessels are smooth and widely patent Left carotid system: Vessels are smooth and widely patent Vertebral arteries: No proximal subclavian stenosis. Skeleton: Negative Other neck: No acute finding Upper chest: No acute finding Review of the MIP images confirms the above findings CTA HEAD FINDINGS Anterior circulation: No emergent large vessel occlusion. No proximal flow limiting stenosis, beading, or aneurysm Posterior circulation:  No emergent finding Venous sinuses: Unremarkable Review of the MIP images confirms the above findings CT Brain Perfusion Findings: ASPECTS: 10 CBF (<30%) Volume: 107mL Perfusion (Tmax>6.0s) volume: 94mL. With a 4 second threshold there is and even greater area of diminished perfusion in the left cerebral hemisphere, question shower of emboli or autolysis. These results were called by telephone  at the time of interpretation on 10/26/2021 at 10:15 am to provider [facility] , who verbally acknowledged these results. IMPRESSION: 1. No emergent large vessel occlusion. 2. CT perfusion shows ischemic range blood flow in the left frontal lobe, suspect peripheral emboli. 3. No embolic source or significant atheromatous disease seen in the neck. Electronically Signed   By: Jorje Guild M.D.   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  -I spoke with neurologist Dr. Erlinda Hong today, he agrees with anticoagulation, and plan to stop aspirin and Plavix when she is on for anticoagulation. -I will see her back in my office, plan to complete hypercoagulopathy work-up when she came off anticoagulation.  Due to her young age and a family history of stroke (mother had a stroke at the age of 78), concerning for hypercoagulopathy status.  -please give her one more dose iv venofer 400-500mg  before discharge  -I spoke with Dr. Kurtis Bushman    All questions were answered. The patient knows to call the clinic with any problems, questions or concerns.      Truitt Merle, MD 11/01/2021 12:26 PM

## 2021-11-02 ENCOUNTER — Other Ambulatory Visit (HOSPITAL_COMMUNITY): Payer: Self-pay

## 2021-11-02 ENCOUNTER — Encounter (HOSPITAL_COMMUNITY): Payer: Self-pay | Admitting: Cardiology

## 2021-11-02 LAB — RESP PANEL BY RT-PCR (FLU A&B, COVID) ARPGX2
Influenza A by PCR: NEGATIVE
Influenza B by PCR: NEGATIVE
SARS Coronavirus 2 by RT PCR: NEGATIVE

## 2021-11-02 NOTE — Progress Notes (Signed)
PROGRESS NOTE    Megan Chaney  ZOX:[id number] DOB: 1971/01/07 DOA: 10/26/2021 PCP: Michael Boston, MD    Brief Narrative:  : Megan Chaney is a 51 y.o. female with medical history significant of chronic iron deficiency anemia secondary to fibroids and menorrhagia on hormone therapy, cigar smoker, GERD, presented with sudden onset of right-sided weakness and aphagia family stroke. She was found with right upper extremity DVT at the IV site.  Was started on Eliquis without loading dose and her Plavix and aspirin were discontinued.  Has been in sinus tachycardia.  Since she had the DVT on the right upper extremity went ahead and got a CTA which was negative for PE. 1/9 CIR pending   Consultants:  Neurology, cardiology, hematology  Procedures:   Antimicrobials:      Subjective: Has no complaints.  Denies shortness of breath or chest pain.  Her speech is more fluent.  Objective: Vitals:   11/01/21 1612 11/01/21 2052 11/01/21 2339 11/02/21 0349  BP: (!) 130/92 118/82 119/83 117/77  Pulse: (!) 128 93 99 99  Resp: 16 20 17 14   Temp: 98.4 F (36.9 C) 98.6 F (37 C) 98.2 F (36.8 C) 98.5 F (36.9 C)  TempSrc: Oral Axillary Oral Oral  SpO2: 100% 100% 100% 100%  Weight:      Height:        Intake/Output Summary (Last 24 hours) at 11/02/2021 8119 Last data filed at 11/02/2021 0017 Gross per 24 hour  Intake 251.2 ml  Output --  Net 251.2 ml     Filed Weights   10/26/21 0900  Weight: 73.4 kg    Examination: NAD, pleasant CTA no wheezing Regular S1-S2 no gallops Soft benign positive bowel sounds No edema 's awake oriented x3.  Speech definitely more fluent than previous days.  Right arm able to raise more today.  Data Reviewed: I have personally reviewed following labs and imaging studies  CBC: Recent Labs  Lab 10/26/21 0940 10/26/21 0948 10/27/21 0326 10/28/21 0300 10/29/21 1628 11/01/21 0203  WBC 14.0*  --   --  10.9*  --  9.8  NEUTROABS 11.2*  --   --    --   --   --   HGB 7.2* 8.2* 7.5* 7.5* 8.5* 8.9*  HCT 24.2* 24.0* 24.5* 23.4* 26.8* 28.7*  MCV 85.5  --   --  82.1  --  87.0  PLT 457*  --   --  389  --  147*   Basic Metabolic Panel: Recent Labs  Lab 10/26/21 0940 10/26/21 0948 10/31/21 0941  NA 138 138 138  K 4.0 3.9 3.6  CL 107 107 109  CO2 21*  --  19*  GLUCOSE 118* 116* 98  BUN 15 15 15   CREATININE 0.64 0.60 0.63  CALCIUM 8.8*  --  8.8*   GFR: Estimated Creatinine Clearance: 82.5 mL/min (by C-G formula based on SCr of 0.63 mg/dL). Liver Function Tests: Recent Labs  Lab 10/26/21 0940  AST 28  ALT 25  ALKPHOS 57  BILITOT 0.4  PROT 6.5  ALBUMIN 3.7   No results for input(s): LIPASE, AMYLASE in the last 168 hours. No results for input(s): AMMONIA in the last 168 hours. Coagulation Profile: Recent Labs  Lab 10/26/21 0940  INR 0.9   Cardiac Enzymes: No results for input(s): CKTOTAL, CKMB, CKMBINDEX, TROPONINI in the last 168 hours. BNP (last 3 results) No results for input(s): PROBNP in the last 8760 hours. HbA1C: No results for input(s): HGBA1C in the  last 72 hours.  CBG: Recent Labs  Lab 10/26/21 0938  GLUCAP 103*   Lipid Profile: No results for input(s): CHOL, HDL, LDLCALC, TRIG, CHOLHDL, LDLDIRECT in the last 72 hours.  Thyroid Function Tests: No results for input(s): TSH, T4TOTAL, FREET4, T3FREE, THYROIDAB in the last 72 hours.  Anemia Panel: No results for input(s): VITAMINB12, FOLATE, FERRITIN, TIBC, IRON, RETICCTPCT in the last 72 hours.  Sepsis Labs: No results for input(s): PROCALCITON, LATICACIDVEN in the last 168 hours.  Recent Results (from the past 240 hour(s))  Resp Panel by RT-PCR (Flu A&B, Covid) Nasopharyngeal Swab     Status: None   Collection Time: 10/27/21  3:55 PM   Specimen: Nasopharyngeal Swab; Nasopharyngeal(NP) swabs in vial transport medium  Result Value Ref Range Status   SARS Coronavirus 2 by RT PCR NEGATIVE NEGATIVE Final    Comment: (NOTE) SARS-CoV-2 target  nucleic acids are NOT DETECTED.  The SARS-CoV-2 RNA is generally detectable in upper respiratory specimens during the acute phase of infection. The lowest concentration of SARS-CoV-2 viral copies this assay can detect is 138 copies/mL. A negative result does not preclude SARS-Cov-2 infection and should not be used as the sole basis for treatment or other patient management decisions. A negative result may occur with  improper specimen collection/handling, submission of specimen other than nasopharyngeal swab, presence of viral mutation(s) within the areas targeted by this assay, and inadequate number of viral copies(<138 copies/mL). A negative result must be combined with clinical observations, patient history, and epidemiological information. The expected result is Negative.  Fact Sheet for Patients:  EntrepreneurPulse.com.au  Fact Sheet for Healthcare Providers:  IncredibleEmployment.be  This test is no t yet approved or cleared by the [place] FDA and  has been authorized for detection and/or diagnosis of SARS-CoV-2 by FDA under an Emergency Use Authorization (EUA). This EUA will remain  in effect (meaning this test can be used) for the duration of the COVID-19 declaration under Section 564(b)(1) of the Act, 21 U.S.C.section 360bbb-3(b)(1), unless the authorization is terminated  or revoked sooner.       Influenza A by PCR NEGATIVE NEGATIVE Final   Influenza B by PCR NEGATIVE NEGATIVE Final    Comment: (NOTE) The Xpert Xpress SARS-CoV-2/FLU/RSV plus assay is intended as an aid in the diagnosis of influenza from Nasopharyngeal swab specimens and should not be used as a sole basis for treatment. Nasal washings and aspirates are unacceptable for Xpert Xpress SARS-CoV-2/FLU/RSV testing.  Fact Sheet for Patients: EntrepreneurPulse.com.au  Fact Sheet for Healthcare  Providers: IncredibleEmployment.be  This test is not yet approved or cleared by the [place] FDA and has been authorized for detection and/or diagnosis of SARS-CoV-2 by FDA under an Emergency Use Authorization (EUA). This EUA will remain in effect (meaning this test can be used) for the duration of the COVID-19 declaration under Section 564(b)(1) of the Act, 21 U.S.C. section 360bbb-3(b)(1), unless the authorization is terminated or revoked.  Performed at [facility], [place] [address] [place], [place] [address]          Radiology Studies: CT Angio Chest Pulmonary Embolism (PE) W or WO Contrast  Result Date: 11/01/2021 CLINICAL DATA:  Chest pain, shortness of breath EXAM: CT ANGIOGRAPHY CHEST WITH CONTRAST TECHNIQUE: Multidetector CT imaging of the chest was performed using the standard protocol during bolus administration of intravenous contrast. Multiplanar CT image reconstructions and MIPs were obtained to evaluate the vascular anatomy. CONTRAST:  18mL OMNIPAQUE IOHEXOL 350 MG/ML SOLN COMPARISON:  None. FINDINGS:  Cardiovascular: Satisfactory opacification of the pulmonary arteries to the segmental level. No evidence of pulmonary embolism. Mild cardiomegaly. No pericardial effusion. Mediastinum/Nodes: No enlarged mediastinal, hilar, or axillary lymph nodes. Thyroid gland, trachea, and esophagus demonstrate no significant findings. Lungs/Pleura: Lungs are clear. Small pneumatocele of the left lung base (series 6, image 102). No pleural effusion or pneumothorax. Upper Abdomen: No acute abnormality. Musculoskeletal: No chest wall abnormality. No acute or significant osseous findings. Review of the MIP images confirms the above findings. IMPRESSION: 1. Negative examination for pulmonary embolism. 2. Mild cardiomegaly. Electronically Signed   By: Delanna Ahmadi M.D.   On: 11/01/2021 12:26   VAS [place] UPPER EXTREMITY VENOUS DUPLEX  Result Date: 11/01/2021 UPPER VENOUS  STUDY  Patient Name:  CAMILAH SPILLMAN  Date of Exam:   10/31/2021 Medical Rec #: [id number]        Accession #:    [id number] Date of Birth: 12/15/1970        Patient Gender: F Patient Age:   23 years Exam Location:  [facility] Procedure:      VAS [place] UPPER EXTREMITY VENOUS DUPLEX Referring Phys: Gwynneth Albright [facility] --------------------------------------------------------------------------------  Indications: Pain, swelling, erythema, palpable cord right arm Other Indications: Recent IV. Comparison Study: No prior upper extremity studies. Performing Technologist: Darlin Coco RDMS, RVT  Examination Guidelines: A complete evaluation includes B-mode imaging, spectral Doppler, color Doppler, and power Doppler as needed of all accessible portions of each vessel. Bilateral testing is considered an integral part of a complete examination. Limited examinations for reoccurring indications may be performed as noted.  Right Findings: +----------+------------+---------+-----------+----------+-------+  RIGHT      Compressible Phasicity Spontaneous Properties Summary  +----------+------------+---------+-----------+----------+-------+  IJV            Full        Yes        Yes                         +----------+------------+---------+-----------+----------+-------+  Subclavian                 Yes        Yes                         +----------+------------+---------+-----------+----------+-------+  Axillary       Full                                               +----------+------------+---------+-----------+----------+-------+  Brachial       Full                                               +----------+------------+---------+-----------+----------+-------+  Radial       Partial                                      Acute   +----------+------------+---------+-----------+----------+-------+  Ulnar          Full                                               +----------+------------+---------+-----------+----------+-------+  Cephalic        None        No         No                  Acute   +----------+------------+---------+-----------+----------+-------+  Basilic        Full                                               +----------+------------+---------+-----------+----------+-------+ High bifurcation of the brachial artery with non-compressibility of a single radial vein in the upper arm as well as the forearm.  Left Findings: +----------+------------+---------+-----------+----------+-------+  LEFT       Compressible Phasicity Spontaneous Properties Summary  +----------+------------+---------+-----------+----------+-------+  Subclavian                 Yes        Yes                         +----------+------------+---------+-----------+----------+-------+  Summary:  Right: Findings consistent with acute deep vein thrombosis involving the right radial veins. Findings consistent with acute superficial vein thrombosis involving the right cephalic vein.  *See table(s) above for measurements and observations.  Diagnosing physician: Orlie Pollen Electronically signed by Orlie Pollen on 11/01/2021 at 2:32:27 PM.    Final         Scheduled Meds:  acidophilus  1 capsule Oral Daily   apixaban  5 mg Oral BID   atorvastatin  40 mg Oral Daily   calcium-vitamin D  1 tablet Oral BID   cephALEXin  500 mg Oral Q8H   ferrous sulfate  325 mg Oral TID WC   megestrol  40 mg Oral BID   metoprolol succinate  12.5 mg Oral Daily   nicotine  21 mg Transdermal Daily   polyethylene glycol  17 g Oral BID   senna-docusate  1 tablet Oral QHS   Vitamin D (Ergocalciferol)  50,000 Units Oral Q Wed   Continuous Infusions:    Assessment & Plan:   Principal Problem:   Acute ischemic stroke [facility])   Stroke: Left middle cerebral artery branch infarct likely secondary due to embolism of cryptogenic source Please see CT/MRI result EF nml EF. No shunt, neg. Bubble LE neg dvt Goal LDL <70 A1c 4.3 1/8 her symptoms clinically are improving  on  dysphagia 2 diet now. TEE with no clot or definite cardiac source of embolism, small PFO. On Eliquis now, DAPT was discontinued since started on Eliquis for blood clot. Event monitor placed. Anticardiolipin antibodies negative, ANA negative. Will need to follow-up with cardiology and neurology as outpatient. CIR pending    Right Upper extre cellulitis Location where IV was placed previously 1/9 erythema and swelling has improved  Venous ultrasound with acute DVT of the right radial vein, acute superficial vein thrombosis involving right cephalic vein  Hematology following.  She will need to complete hypercoagulable work-up when she is off her anticoagulation and will need to follow-up with this with Dr. Annamaria Boots.   Hematology recommended Eliquis for 3 months  No loading dose of Eliquis per neurology's recommendation   Sinus tachycardia Since admission on and off. Etiology unclear. CTA negative for PE On low-dose beta-blocker    Iron deficiency anemia Due to menorrhagia.  Patient reports being on her period currently Does have fibroids will  need to follow-up GYN as outpatient 1/9 received second dose of IV venofer. Will ck am labs      Marijuana use Told me she does not smoke cigarrets marijuana cessation discussed with patient      DVT prophylaxis: Eliquis Code Status:full Family Communication: Fianc at bedside Disposition Plan: CIR Status is: Inpatient  Remains inpatient appropriate because: Unsafe discharge, awaiting CIR placement/insurance authorization   Patient is medically stable for discharge to CIR when insurance authorization available            LOS: 7 days   Time spent: 35 minutes with more than 50% on [place], MD [facility] Pager [phone]  If 7PM-7AM, please contact night-coverage 11/02/2021, 8:12 AM

## 2021-11-02 NOTE — Plan of Care (Signed)
°  Problem: Coping: Goal: Will verbalize positive feelings about self Outcome: Progressing Goal: Will identify appropriate support needs Outcome: Progressing   Problem: Coping: Goal: Level of anxiety will decrease Outcome: Progressing

## 2021-11-02 NOTE — TOC Progression Note (Addendum)
Transition of Care [facility]) - Progression Note    Patient Details  Name: Megan Chaney MRN: [id number] Date of Birth: Jul 01, 1971  Transition of Care [facility] CM/SW Contact  Pollie Friar, RN Phone Number: 11/02/2021, 11:31 AM  Clinical Narrative:    CM spoke to [facility] and they have not received authorization from [facility] yet.  CM has provided the patient with 30 day free and $10 co pay cards for Eliquis.  TOC following.  1520: pt has received insurance approval for [facility]. They asked to admit pt in the am. MD updated. Covid negative.   Expected Discharge Plan: IP Rehab Facility Barriers to Discharge: Continued Medical Work up  Expected Discharge Plan and Services Expected Discharge Plan: [place]   Discharge Planning Services: CM Consult Post Acute Care Choice: IP Rehab Living arrangements for the past 2 months: Single Family Home                                       Social Determinants of Health (SDOH) Interventions    Readmission Risk Interventions No flowsheet data found.

## 2021-11-02 NOTE — Progress Notes (Signed)
Speech Language Pathology Treatment: Dysphagia;Cognitive-Linquistic  Patient Details Name: Megan Chaney MRN: [id number] DOB: Nov 21, 1970 Today's Date: 11/02/2021 Time: 2482-5003 SLP Time Calculation (min) (ACUTE ONLY): 20 min  Assessment / Plan / Recommendation Clinical Impression  Pt's expressive language appears to be more fluent today than what has previously been reported in prior SLP visits, and she acknowledges that she thinks that she is improved as well. Advanced trials of regular solids were provided, along with intermittent sips of thin liquids, with no significant pocketing noted. Education was provided about aspiration precautions as well as potential need for lingual sweep to the R with more solid diet to see if there is any pocketing across a meal.   Pt was given Min cues for word finding errors across automatic verbal tasks [facility] for days of week, needing more help with months of year) and verbal description/sequencing task. Pt also shows good awareness of errors, often also noted to be phonemic paraphasias. She can often slow herself down and try again with some improvement. Education was reinforced with pt and fiance about how to use strategies to help further. Pt also practiced reading at the sentence level, with phonemic errors and a few word omissions noted. Pt also had some confusion in regards to comprehension, but question if this could also be related to the complexity of the information she was reading. Continue to recommend ongoing SLP services.    HPI HPI: 51 y.o. female presented to ED with aphasia and right sided weakness. MRI: Multifocal acute/subacute nonhemorrhagic infarcts involving the left MCA territory. Findings are predominantly inferior and posteriorly. There is cortical infarction also involves the more anterior middle and inferior frontal lobes. Prior medical history significant for chronic iron deficiency anemia secondary to fibroids and menorrhagia on hormone  therapy, cigar smoker, GERD.      SLP Plan  Continue with current plan of care      Recommendations for follow up therapy are one component of a multi-disciplinary discharge planning process, led by the attending physician.  Recommendations may be updated based on patient status, additional functional criteria and insurance authorization.    Recommendations  Diet recommendations: Regular;Thin liquid Liquids provided via: Cup;Straw Medication Administration: Whole meds with liquid Supervision: Patient able to self feed Compensations: Slow rate;Small sips/bites;Lingual sweep for clearance of pocketing Postural Changes and/or Swallow Maneuvers: Seated upright 90 degrees                Oral Care Recommendations: Oral care BID Follow Up Recommendations: Acute inpatient rehab (3hours/day) Assistance recommended at discharge: Frequent or constant Supervision/Assistance SLP Visit Diagnosis: Aphasia (R47.01);Dysphagia, oral phase (R13.11) Plan: Continue with current plan of care           Osie Bond., M.A. [place] [facility] Pager [phone] Office [phone]  11/02/2021, 12:48 PM

## 2021-11-02 NOTE — Progress Notes (Signed)
Occupational Therapy Treatment Patient Details Name: Megan Chaney MRN: [id number] DOB: 04-28-1971 Today's Date: 11/02/2021   History of present illness 51 y/o female admitted 1/2 after being found down at work with aphasia and R sided weakness. Pt found to have Multifocal acute/subacute nonhemorrhagic infarcts involving the L MCA territory. PMHx: chronic iron deficiency anemia secondary to fibroids and menorrhagia.   OT comments  Patient continues to make excellent progress towards goals in skilled OT session. Patient's session encompassed  neuromuscular re-education with increased focus on RUE, challenging balance in multiple planes, following commands to complete ADLs and functional mobility. Patient with significantly improved speech, with minimal difficulty with word finding but able to have an intelligible conversation. Patient with improved grasp and release with RUE, improved flexion and extension at elbow, with deficits noted in shoulder elevation and depression (trace noted in RUE when combining with LUE and tactile cues unable to isolated) and deficits in end range flexion and extension in elbow, and decreased [place]. Patient able to complete challenge in seated balance and able to reach items off the floor at min guard level sitting EOB with no LOB. In standing, patient able to complete functional mobility, but could not accept challenge due to fatigue. Improved coordination noted with standing ADLs, needing no additional verbal cues to incorporate RUE, but continues to demonstrate difficulty with basic sequencing (often able to catch independently but required min verbal cues). Continue to recommend inpatient rehab as patient has an excellent support system and potential for recovery. Therapy will continue to follow acutely.    Recommendations for follow up therapy are one component of a multi-disciplinary discharge planning process, led by the attending physician.  Recommendations may be  updated based on patient status, additional functional criteria and insurance authorization.    Follow Up Recommendations  Acute inpatient rehab (3hours/day)    Assistance Recommended at Discharge Intermittent Supervision/Assistance  Patient can return home with the following  A little help with bathing/dressing/bathroom;Assistance with cooking/housework;Direct supervision/assist for medications management;Direct supervision/assist for financial management;Assist for transportation;Help with stairs or ramp for entrance;A little help with walking and/or transfers   Equipment Recommendations  BSC/3in1    Recommendations for Other Services Rehab consult    Precautions / Restrictions Precautions Precautions: Fall Precaution Comments: painful R UE Restrictions Weight Bearing Restrictions: No       Mobility Bed Mobility Overal bed mobility: Needs Assistance Bed Mobility: Supine to Sit     Supine to sit: Supervision;HOB elevated     General bed mobility comments: HOB elevated, but did not need to use rail, good powering up without use of BUEs    Transfers Overall transfer level: Needs assistance Equipment used: None Transfers: Sit to/from Stand Sit to Stand: Min guard           General transfer comment: min guard for balance, but with no balance difficulties noted but did not challenge in standing, good powering up with no need for increased time, ambulated with RUE in a hiked protected position with cues to walk with RUE by side     Balance Overall balance assessment: Needs assistance Sitting-balance support: Feet supported Sitting balance-Leahy Scale: Good Sitting balance - Comments: able to tolerate challenge in sitting in numerous planes, able to reach washcloth off of floor without loss of balance   Standing balance support: No upper extremity supported Standing balance-Leahy Scale: Fair Standing balance comment: able to complete standing ADL tasks without leaning  against sink or propping UE on sink  ADL either performed or assessed with clinical judgement   ADL Overall ADL's : Needs assistance/impaired     Grooming: Wash/dry face;Wash/dry hands;Oral care;Set up;Min guard Grooming Details (indicate cue type and reason): able to complete sequence for tooth brushing with extra time, able to complete bimanual tasks (unscrewing lid), able to use R dominant hand more, but compensates by moving head instead of hand to brush teeth             Lower Body Dressing: Set up;Sitting/lateral leans Lower Body Dressing Details (indicate cue type and reason): able to don both socks with figure 4 sitting EOB Toilet Transfer: Min guard;Ambulation Toilet Transfer Details (indicate cue type and reason): simualted with movement around room, able to power up from sittting without use of upper extremities         Functional mobility during ADLs: Min guard General ADL Comments: improved communication, movement, and sequencing in session, is becoming more aware of deficits and is able to verbally note them, continues to report pain/fatigue in RUE but is highly motivated to move RUE    Extremity/Trunk Assessment              Vision       Perception     Praxis      Cognition Arousal/Alertness: Awake/alert Behavior During Therapy: WFL for tasks assessed/performed Overall Cognitive Status: Impaired/Different from baseline Area of Impairment: Safety/judgement;Problem solving;Following commands                       Following Commands: Follows multi-step commands consistently;Follows multi-step commands with increased time Safety/Judgement: Decreased awareness of safety   Problem Solving: Slow processing General Comments: improved apraxia with repetition, especially with [facility] with RUE          Exercises     Shoulder Instructions       General Comments      Pertinent Vitals/ Pain       Pain  Assessment: Faces Faces Pain Scale: Hurts little more Pain Location: R UE/IV site Pain Descriptors / Indicators: Sore Pain Intervention(s): Limited activity within patient's tolerance;Monitored during session;Repositioned  Home Living                                          Prior Functioning/Environment              Frequency  Min 2X/week        Progress Toward Goals  OT Goals(current goals can now be found in the care plan section)  Progress towards OT goals: Progressing toward goals  Acute Rehab OT Goals Patient Stated Goal: to get better OT Goal Formulation: With patient Time For Goal Achievement: 11/10/21 Potential to Achieve Goals: Good  Plan Discharge plan remains appropriate    Co-evaluation                 AM-PAC OT "6 Clicks" Daily Activity     Outcome Measure   Help from another person eating meals?: A Little Help from another person taking care of personal grooming?: A Little Help from another person toileting, which includes using toliet, bedpan, or urinal?: A Little Help from another person bathing (including washing, rinsing, drying)?: A Little Help from another person to put on and taking off regular upper body clothing?: A Little Help from another person to put on and taking off regular lower body clothing?: A Little  6 Click Score: 18    End of Session    OT Visit Diagnosis: Unsteadiness on feet (R26.81);Other abnormalities of gait and mobility (R26.89);Muscle weakness (generalized) (M62.81);Apraxia (R48.2);Other symptoms and signs involving cognitive function;Other symptoms and signs involving the nervous system (R29.898);Cognitive communication deficit (R41.841);Hemiplegia and hemiparesis Symptoms and signs involving cognitive functions: Cerebral infarction Hemiplegia - Right/Left: Right Hemiplegia - dominant/non-dominant: Dominant Hemiplegia - caused by: Cerebral infarction   Activity Tolerance Patient tolerated  treatment well   Patient Left in bed;with call bell/phone within reach;Other (comment);with family/visitor present (sitting EOB)   Nurse Communication Mobility status        Time: 8889-1694 OT Time Calculation (min): 37 min  Charges: OT General Charges $OT Visit: 1 Visit OT Treatments $Neuromuscular Re-education: 23-37 mins  Elsah. Megan [place], [place] Acute Rehabilitation Services [place] 11/02/2021, 10:34 AM

## 2021-11-02 NOTE — TOC Benefit Eligibility Note (Signed)
Patient Teacher, English as a foreign language completed.    The patient is currently admitted and upon discharge could be taking Eliquis 5 mg.  The current 30 day co-pay is, $550.09  [facility] PERIOD CLAIM. PATIENT REQUIRED TO PAY FOR THE FULL COST OF THE PRESCRIPTION..   The patient is insured through [employer] [place], [place] Patient Advocate Specialist [place] Patient Advocate Team Direct Number: [phone]  Fax: [phone]

## 2021-11-02 NOTE — Progress Notes (Signed)
Physical Therapy Treatment Patient Details Name: Megan Chaney MRN: [id number] DOB: 1971-08-09 Today's Date: 11/02/2021   History of Present Illness 51 y/o female admitted 1/2 after being found down at work with aphasia and R sided weakness. Pt found to have Multifocal acute/subacute nonhemorrhagic infarcts involving the L MCA territory. PMHx: chronic iron deficiency anemia secondary to fibroids and menorrhagia.    PT Comments    Patient progressing slowly due to easily fatigued having had all 3 disciplines today and had visits from family.  She reports an episode up to bathroom without staff in the room without fall, but with LOB, issues with her clothing and heart monitor.  Patient educated about needing rest due to fatigue as part of stroke recovery and to call for assistance to reduce fall risk.  Patient will continue benefit from skilled PT in the acute setting to maximize safety and for mobility progression.  She remains appropriate for acute inpatient rehab at d/c.   Recommendations for follow up therapy are one component of a multi-disciplinary discharge planning process, led by the attending physician.  Recommendations may be updated based on patient status, additional functional criteria and insurance authorization.  Follow Up Recommendations  Acute inpatient rehab (3hours/day)     Assistance Recommended at Discharge Frequent or constant Supervision/Assistance  Patient can return home with the following A lot of help with walking and/or transfers;Help with stairs or ramp for entrance;A lot of help with bathing/dressing/bathroom;Assistance with cooking/housework;Direct supervision/assist for medications management;Assist for transportation;Direct supervision/assist for financial management   Equipment Recommendations  Cane    Recommendations for Other Services       Precautions / Restrictions Precautions Precautions: Fall     Mobility  Bed Mobility   Bed Mobility: Sit to  Supine       Sit to supine: Supervision   General bed mobility comments: cues for safety/positioning    Transfers Overall transfer level: Needs assistance Equipment used: Straight cane Transfers: Sit to/from Stand Sit to Stand: Min guard           General transfer comment: up to stand with CGA for safety with balance and for cane positioning    Ambulation/Gait Ambulation/Gait assistance: Min guard Gait Distance (Feet): 120 Feet Assistive device: Straight cane         General Gait Details: continued increased R lateral weight shift and decreased hip stability, pt c/o fatigue   Stairs             Wheelchair Mobility    Modified Rankin (Stroke Patients Only) Modified Rankin (Stroke Patients Only) Pre-Morbid Rankin Score: No symptoms Modified Rankin: Moderately severe disability     Balance Overall balance assessment: Needs assistance Sitting-balance support: Feet supported Sitting balance-Leahy Scale: Good Sitting balance - Comments: able to tolerate challenge in sitting in numerous planes, able to reach washcloth off of floor without loss of balance   Standing balance support: Single extremity supported;No upper extremity supported Standing balance-Leahy Scale: Fair                              Cognition Arousal/Alertness: Awake/alert   Overall Cognitive Status: Impaired/Different from baseline Area of Impairment: Safety/judgement;Problem solving;Following commands                       Following Commands: Follows multi-step commands consistently;Follows multi-step commands with increased time Safety/Judgement: Decreased awareness of safety   Problem Solving: Slow processing  Exercises      General Comments General comments (skin integrity, edema, etc.): HR 120 with ambulation today; heart monitor off and pt reports it got in her way.  Admitted to up to bathroom on her own with significant issues with robe  falling into toilet, heart monitor falling off and pt with some LOB      Pertinent Vitals/Pain Pain Assessment: No/denies pain    Home Living                          Prior Function            PT Goals (current goals can now be found in the care plan section) Progress towards PT goals: Progressing toward goals    Frequency    Min 4X/week      PT Plan Current plan remains appropriate    Co-evaluation              AM-PAC PT "6 Clicks" Mobility   Outcome Measure  Help needed turning from your back to your side while in a flat bed without using bedrails?: None Help needed moving from lying on your back to sitting on the side of a flat bed without using bedrails?: A Little Help needed moving to and from a bed to a chair (including a wheelchair)?: A Little   Help needed to walk in hospital room?: A Little Help needed climbing 3-5 steps with a railing? : A Lot 6 Click Score: 15    End of Session Equipment Utilized During Treatment: Gait belt Activity Tolerance: Patient limited by fatigue Patient left: in bed;with call bell/phone within reach;with bed alarm set         Time: 1420-1447 PT Time Calculation (min) (ACUTE ONLY): 27 min  Charges:  $Gait Training: 8-22 mins $Self Care/Home Management: 8-22                     Magda Kiel, PT Acute Rehabilitation Services [phone] Office:[phone] 11/02/2021    Reginia Naas 11/02/2021, 4:41 PM

## 2021-11-03 DIAGNOSIS — E785 Hyperlipidemia, unspecified: Secondary | ICD-10-CM | POA: Diagnosis present

## 2021-11-03 DIAGNOSIS — D649 Anemia, unspecified: Secondary | ICD-10-CM | POA: Diagnosis present

## 2021-11-03 DIAGNOSIS — I639 Cerebral infarction, unspecified: Secondary | ICD-10-CM | POA: Insufficient documentation

## 2021-11-03 DIAGNOSIS — I82621 Acute embolism and thrombosis of deep veins of right upper extremity: Secondary | ICD-10-CM | POA: Insufficient documentation

## 2021-11-03 DIAGNOSIS — R531 Weakness: Secondary | ICD-10-CM | POA: Insufficient documentation

## 2021-11-03 LAB — CBC
HCT: 29.5 % — ABNORMAL LOW (ref 36.0–46.0)
Hemoglobin: 9.2 g/dL — ABNORMAL LOW (ref 12.0–15.0)
MCH: 27.4 pg (ref 26.0–34.0)
MCHC: 31.2 g/dL (ref 30.0–36.0)
MCV: 87.8 fL (ref 80.0–100.0)
Platelets: 461 10*3/uL — ABNORMAL HIGH (ref 150–400)
RBC: 3.36 MIL/uL — ABNORMAL LOW (ref 3.87–5.11)
RDW: 21.8 % — ABNORMAL HIGH (ref 11.5–15.5)
WBC: 9.1 10*3/uL (ref 4.0–10.5)
nRBC: 0.6 % — ABNORMAL HIGH (ref 0.0–0.2)

## 2021-11-03 MED ORDER — APIXABAN 5 MG PO TABS: 5.0000 mg | ORAL_TABLET | Freq: Two times a day (BID) | ORAL | Status: DC

## 2021-11-03 MED ORDER — INFLUENZA VAC SPLIT QUAD 0.5 ML IM SUSY
0.5000 mL | PREFILLED_SYRINGE | Freq: Once | INTRAMUSCULAR | Status: DC
  Filled 2021-11-03: qty 0.5

## 2021-11-03 MED ORDER — ACETAMINOPHEN 325 MG PO TABS: 650.0000 mg | ORAL_TABLET | ORAL | Status: DC | PRN

## 2021-11-03 MED ORDER — ATORVASTATIN CALCIUM 40 MG PO TABS: 40.0000 mg | ORAL_TABLET | Freq: Every day | ORAL | Status: DC

## 2021-11-03 MED ORDER — SENNOSIDES-DOCUSATE SODIUM 8.6-50 MG PO TABS: 1.0000 | ORAL_TABLET | Freq: Every day | ORAL | Status: DC

## 2021-11-03 MED ORDER — CEPHALEXIN 500 MG PO CAPS: 500.0000 mg | ORAL_CAPSULE | Freq: Three times a day (TID) | ORAL | 0 refills | Status: AC

## 2021-11-03 MED ORDER — METOPROLOL SUCCINATE ER 25 MG PO TB24: 12.5000 mg | ORAL_TABLET | Freq: Every day | ORAL | Status: AC

## 2021-11-03 NOTE — TOC Transition Note (Signed)
Transition of Care [facility]) - CM/SW Discharge Note   Patient Details  Name: Megan Chaney MRN: [id number] Date of Birth: 1971-09-01  Transition of Care [facility]) CM/SW Contact:  Pollie Friar, RN Phone Number: 11/03/2021, 9:14 AM   Clinical Narrative:    Patient is discharging to [facility] IR today. Pt will transport via ambulance. Patient and bedside RN updated. Discharge packet is at the desk.   Number for report; [phone]   Final next level of care: IP Rehab Facility Barriers to Discharge: No Barriers Identified   Patient Goals and CMS Choice     Choice offered to / list presented to : Patient  Discharge Placement                       Discharge Plan and Services   Discharge Planning Services: CM Consult Post Acute Care Choice: IP Rehab                               Social Determinants of Health (SDOH) Interventions     Readmission Risk Interventions No flowsheet data found.

## 2021-11-03 NOTE — Discharge Summary (Signed)
Megan Chaney YTK:[id number] DOB: 1971/03/17 DOA: 10/26/2021  PCP: Michael Boston, MD  Admit date: 10/26/2021 Discharge date: 11/03/2021  Admitted From: home Disposition:  CIR  Recommendations for Outpatient Follow-up:  Follow up with PCP in 1 week Please obtain BMP/CBC in one week Please follow up cardiology on 11/06/2021 at 10:55 AM Follow-up with Dr. Leonie Man neurology in 1 month at stroke clinic Follow-up with Dr. Annamaria Boots hematology in 2 weeks     Discharge Condition:Stable CODE STATUS: Full Diet recommendation: Heart Healthy  Brief/Interim Summary: Per HPI: Megan Chaney is a 51 y.o. female with medical history significant of chronic iron deficiency anemia secondary to fibroids and menorrhagia and anemia on hormone therapy, cigar smoker, GERD, presented with sudden onset of right-sided weakness and aphagia.On examination she had global aphasia and right-sided weakness.  NIH stroke scale was 19.  CT head showed no acute intracranial process.  CTA did not show LVO.  CT perfusion showed an 8 mL perfusion deficit and a left MCA branch territory.  TNK was not administered 2/2 unknown LKW. CTA showed no intervenable lesion therefore intervention was not performed.  Patient was admitted to the hospital.  Stroke team was consulted. She was found to have multiple acute infarcts in the left MCA territory on MRI. She underwent stroke work-up.  During her hospitalization with IV line was placed she developed cellulitis and DVT.  Consulted hematology who recommended 3 months of Eliquis.  Neurology recommended stopping Plavix and aspirin and just continuing with Eliquis.  She is stable to be transferred to CIR today.   Stroke: Left middle cerebral artery branch infarct likely secondary due to embolism of cryptogenic source Code Stroke CT head No acute abnormality.  ASPECTS 10.    CTA head & neck No emergent LVO, ischemic range blood flow in frontal lobe, no significant atheromatous disease in the  neck MRI  multifocal acute/subacute infarcts in left MCA territory 2D Echo EF 60-65%, no interatrial shunt TEE small PFO.  No clot or cardiac source of embolism TCD bubble study negative for right-to-left shunt Lower extremity doppler no evidence of DVT LDL 79 HgbA1c 4.3 No antithrombotic prior to admission, was started on aspirin and Plavix initially however he was switched to Eliquis due to upper right extremity DVT.   Therapy recommendations:  CIR she will need to follow-up with cardiology for monitor management Anticardiolipin antibodies negative, ANA negative Per neurology: 70% chance that the PFO is the cause of the stroke. Will need to follow up with cardiology to see if able to  closed the PFO which can be done electively   Hyperlipidemia LDL 79, goal < 70 Added atorvastatin 40 mg daily     Right Upper extre cellulitis Provoked RUE DVT Secondary to IV line erythema improving swelling going down Continue with antibiotics for today and tomorrow [place] FTD:DUKGU deep vein thrombosis involving the right  radial veins; with acute superficial vein thrombosis  involving the right cephalic vein.  Was initially started on lovenox sq and then transitioned to  Eliquis without loading dose per neurology's recommendation Per hematology recommendation: given her recent acute embolic stroke without known source of thrombosis, and her small PFO, Recommended anticoagulation with Xarelto or Eliquis for 3 months Will need to f/u with hematology to complete hypercoagulopathy work-up when she came off anticoagulation.  Due to her young age and a family history of stroke (mother had a stroke at the age of 73), concerning for hypercoagulopathy status.      Chronic iron deficiency anemia  Due to menorrhagia Does have fibroids will need to follow-up GYN as outpatient She was having menstrual bleeding during her hospitalization. She was given 2 doses of IV venofer Continue po iron     Marijuana  use Told me she does not smoke cigarrets  marijuana cessation discussed with patient          Discharge Diagnoses:  Principal Problem:   Acute ischemic stroke [facility])    Discharge Instructions  Discharge Instructions     Ambulatory referral to Neurology   Complete by: As directed    Follow up with Dr. Leonie Man at [facility] in 4-6 weeks. Too complicated for RN to follow. Thanks.      Allergies as of 11/03/2021       Reactions   Latex Hives, Rash   Other Hives, Rash   Powder in latex gloves        Medication List     STOP taking these medications    cefdinir 300 MG capsule Commonly known as: OMNICEF   medroxyPROGESTERone 5 MG tablet Commonly known as: PROVERA   naproxen 500 MG tablet Commonly known as: NAPROSYN   promethazine-dextromethorphan 6.25-15 MG/5ML syrup Commonly known as: PROMETHAZINE-DM   Vitamin D (Ergocalciferol) 1.25 MG (50000 UNIT) Caps capsule Commonly known as: DRISDOL       TAKE these medications    acetaminophen 325 MG tablet Commonly known as: TYLENOL Take 2 tablets (650 mg total) by mouth every 4 (four) hours as needed for mild pain or headache (or temp > 37.5 C (99.5 F)).   apixaban 5 MG Tabs tablet Commonly known as: ELIQUIS Take 1 tablet (5 mg total) by mouth 2 (two) times daily.   atorvastatin 40 MG tablet Commonly known as: LIPITOR Take 1 tablet (40 mg total) by mouth daily.   cephALEXin 500 MG capsule Commonly known as: KEFLEX Take 1 capsule (500 mg total) by mouth every 8 (eight) hours for 2 days.   ferrous sulfate 325 (65 FE) MG tablet TAKE 1 TABLET (325 MG TOTAL) BY MOUTH 3 (THREE) TIMES DAILY WITH MEALS.   megestrol 40 MG tablet Commonly known as: MEGACE TAKE 1 TABLET BY MOUTH TWICE A DAY   metoprolol succinate 25 MG 24 hr tablet Commonly known as: TOPROL-XL Take 0.5 tablets (12.5 mg total) by mouth daily.   Oscal 500/200 D-3 500-5 MG-MCG Tabs Generic drug: Calcium Carbonate-Vitamin D Take 1 tablet by mouth 2  (two) times daily.   senna-docusate 8.6-50 MG tablet Commonly known as: Senokot-S Take 1 tablet by mouth at bedtime.   Vitamin D3 1.25 MG (50000 UT) Caps Take 50,000 Units by mouth once a week.        Follow-up Information     Baldwin Jamaica, PA-C Follow up.   Specialty: Cardiology Why: you have an appointment to see for wound check for the heart monitor implant site 11/06/21, 10:55AM Contact information: [address] [place] [address] [phone]         Garvin Fila, MD. Schedule an appointment as soon as possible for a visit in 1 month(s).   Specialties: Neurology, Radiology Why: stroke clinic Contact information: [address] [place] [address] [phone]         Truitt Merle, MD Follow up in 2 week(s).   Specialties: Hematology, Oncology Contact information: [place] [address] [phone]                Allergies  Allergen Reactions  Latex Hives and Rash   Other Hives and Rash    Powder in latex gloves    Consultations: Neurology, hematology, cardiology   Procedures/Studies: CT Angio Chest Pulmonary Embolism (PE) W or WO Contrast  Result Date: 11/01/2021 CLINICAL DATA:  Chest pain, shortness of breath EXAM: CT ANGIOGRAPHY CHEST WITH CONTRAST TECHNIQUE: Multidetector CT imaging of the chest was performed using the standard protocol during bolus administration of intravenous contrast. Multiplanar CT image reconstructions and MIPs were obtained to evaluate the vascular anatomy. CONTRAST:  125mL OMNIPAQUE IOHEXOL 350 MG/ML SOLN COMPARISON:  None. FINDINGS: Cardiovascular: Satisfactory opacification of the pulmonary arteries to the segmental level. No evidence of pulmonary embolism. Mild cardiomegaly. No pericardial effusion. Mediastinum/Nodes: No enlarged mediastinal, hilar, or axillary lymph nodes. Thyroid gland, trachea, and esophagus demonstrate no significant findings.  Lungs/Pleura: Lungs are clear. Small pneumatocele of the left lung base (series 6, image 102). No pleural effusion or pneumothorax. Upper Abdomen: No acute abnormality. Musculoskeletal: No chest wall abnormality. No acute or significant osseous findings. Review of the MIP images confirms the above findings. IMPRESSION: 1. Negative examination for pulmonary embolism. 2. Mild cardiomegaly. Electronically Signed   By: Delanna Ahmadi M.D.   On: 11/01/2021 12:26   MR BRAIN WO CONTRAST  Result Date: 10/26/2021 CLINICAL DATA:  Neuro deficit, acute, stroke suspected. Right-sided weakness. EXAM: MRI HEAD WITHOUT CONTRAST TECHNIQUE: Multiplanar, multiecho pulse sequences of the brain and surrounding structures were obtained without intravenous contrast. COMPARISON:  CT head without contrast. CT perfusion. CTA head and neck 10/26/2021. FINDINGS: Brain: Multifocal areas restricted diffusion are present in the left MCA territory, predominantly inferior and posteriorly. There is some involvement of the left middle temporal gyrus. Cortical involvement is present in the posterior left parietal lobe as well as the more superior left parietal lobe. Areas of cortical infarction are present in the anterior left frontal lobe involving the middle and inferior frontal cortex. No acute hemorrhage present. The study is moderately degraded by patient motion. Right hemisphere is within normal limits. The ventricles are of normal size. No significant extraaxial fluid collection is present. The internal auditory canals are within normal limits. The brainstem and cerebellum are within normal limits. Vascular: Flow is present in the major intracranial arteries. Skull and upper cervical spine: The craniocervical junction is normal. Upper cervical spine is within normal limits. Marrow signal is unremarkable. Sinuses/Orbits: The paranasal sinuses and mastoid air cells are clear. The globes and orbits are within normal limits. IMPRESSION: 1.  Multifocal acute/subacute nonhemorrhagic infarcts involving the left MCA territory. Findings are predominantly inferior and posteriorly. There is cortical infarction also involves the more anterior middle and inferior frontal lobes. 2. No acute hemorrhage. These results were called by telephone at the time of interpretation on 10/26/2021 at 2:52 pm to provider [facility] , who verbally acknowledged these results. Electronically Signed   By: San Morelle M.D.   On: 10/26/2021 14:54   CT C-SPINE NO CHARGE  Result Date: 10/26/2021 CLINICAL DATA:  Trauma, found down EXAM: CT CERVICAL SPINE WITHOUT CONTRAST TECHNIQUE: Multidetector CT imaging of the cervical spine was performed without intravenous contrast. Multiplanar CT image reconstructions were also generated. COMPARISON:  None. FINDINGS: Alignment: Alignment of posterior margins of vertebral bodies is unremarkable. Degenerative changes are noted with bony spurs at C4-C5 and C5-C6 levels. Skull base and vertebrae: No recent fracture is seen. Soft tissues and spinal canal: There is no significant spinal stenosis. Prevertebral soft tissues are unremarkable. Disc levels: There is mild encroachment of neural foramina  at C4-C5 and C5-C6 levels anterior bony spurs seen in the upper thoracic spine. Upper chest: Unremarkable. Other: Thyroid is enlarged in size. IMPRESSION: No recent fracture is seen in the cervical spine. Alignment of posterior margins of vertebral bodies is unremarkable. Cervical spondylosis with mild encroachment of neural foramina at C4-C5 and C5-C6 levels. Electronically Signed   By: Elmer Picker M.D.   On: 10/26/2021 11:19   EEG adult  Result Date: 10/27/2021 Lora Havens, MD     10/27/2021  7:42 AM Patient Name: Megan Chaney MRN: [id number] Epilepsy Attending: Lora Havens Referring Physician/Provider: Dr Su Monks Date 10/26/2021 Duration: 21.09 mins Patient history: 51 year old woman with a past medical his history  significant for fibroids who presented as a pretty activated stroke code with EMS for aphasia and right-sided weakness c/f acute ischemic stroke. EEG to evaluate for seizure Level of alertness: Awake AEDs during EEG study: None Technical aspects: This EEG study was done with scalp electrodes positioned according to the 10-20 International system of electrode placement. Electrical activity was acquired at a sampling rate of 500Hz  and reviewed with a high frequency filter of 70Hz  and a low frequency filter of 1Hz . EEG data were recorded continuously and digitally stored. Description: The posterior dominant rhythm consists of 10 Hz activity of moderate voltage (25-35 uV) seen predominantly in posterior head regions, asymmetric (left<right) and reactive to eye opening and eye closing.  Hyperventilation and photic stimulation were not performed.   ABNORMALITY - Background asymmetry, left<right IMPRESSION: This study is suggestive of cortical dysfunction arising from left occipital region likely secondary to underlying structural abnormality/stroke. No seizures or epileptiform discharges were seen throughout the recording. Priyanka [name]   Overnight EEG with video  Result Date: 10/27/2021 Lora Havens, MD     10/27/2021  9:47 AM Patient Name: Joel Mericle MRN: [id number] Epilepsy Attending: Lora Havens Referring Physician/Provider: Dr Su Monks Duration: 10/26/2021 2951 to 10/27/2021 0848  Patient history: 51 year old woman with a past medical his history significant for fibroids who presented as a pretty activated stroke code with EMS for aphasia and right-sided weakness c/f acute ischemic stroke. EEG to evaluate for seizure  Level of alertness: Awake, asleep  AEDs during EEG study: None  Technical aspects: This EEG study was done with scalp electrodes positioned according to the 10-20 International system of electrode placement. Electrical activity was acquired at a sampling rate of 500Hz  and reviewed  with a high frequency filter of 70Hz  and a low frequency filter of 1Hz . EEG data were recorded continuously and digitally stored.  Description: The posterior dominant rhythm consists of 10 Hz activity of moderate voltage (25-35 uV) seen predominantly in posterior head regions, asymmetric (left<right) and reactive to eye opening and eye closing.  Hyperventilation and photic stimulation were not performed.    ABNORMALITY - Background asymmetry, left<right  IMPRESSION: This study is suggestive of cortical dysfunction arising from left occipital region likely secondary to underlying structural abnormality/stroke. No seizures or epileptiform discharges were seen throughout the recording.  Priyanka O Yadav   VAS [place] TRANSCRANIAL DOPPLER W BUBBLES  Result Date: 10/28/2021  Transcranial Doppler with Bubble Patient Name:  Megan Chaney  Date of Exam:   10/27/2021 Medical Rec #: [id number]        Accession #:    [id number] Date of Birth: April 22, 1971        Patient Gender: F Patient Age:   52 years Exam Location:  [facility] Procedure:      VAS  [place] TRANSCRANIAL DOPPLER W BUBBLES Referring Phys: [place] --------------------------------------------------------------------------------  Indications: Stroke. Comparison Study: No prior studies. Performing Technologist: Darlin Coco RDMS, RVT  Examination Guidelines: A complete evaluation includes B-mode imaging, spectral Doppler, color Doppler, and power Doppler as needed of all accessible portions of each vessel. Bilateral testing is considered an integral part of a complete examination. Limited examinations for reoccurring indications may be performed as noted.  Summary: No HITS at rest or during Valsalva. Negative transcranial Doppler Bubble study with no evidence of right to left intracardiac communication.  Negative TCD Bubble study *See table(s) above for TCD measurements and observations.  Diagnosing physician: Antony Contras MD Electronically signed by  Antony Contras MD on 10/28/2021 at 1:18:58 PM.    Final    ECHO TEE  Result Date: 10/30/2021    TRANSESOPHOGEAL ECHO REPORT   Patient Name:   HEILEY SHAIKH Date of Exam: 10/30/2021 Medical Rec #:  [id number]       Height:       63.0 in Accession #:    [id number]      Weight:       161.8 lb Date of Birth:  April 29, 1971       BSA:          1.767 m Patient Age:    30 years        BP:           142/78 mmHg Patient Gender: F               HR:           106 bpm. Exam Location:  Inpatient Procedure: Transesophageal Echo Indications:    Stroke  History:        Patient has prior history of Echocardiogram examinations.                 Stroke.  Sonographer:    Johny Chess RDCS Referring Phys: [id number] [facility] [place] PROCEDURE: The transesophogeal probe was passed without difficulty through the esophogus of the patient. Sedation performed by different physician. The patient's vital signs; including heart rate, blood pressure, and oxygen saturation; remained stable throughout the procedure. The patient developed no complications during the procedure. IMPRESSIONS  1. Small PFO with positive bubble study Latter suboptimal as patiensts anesthesia was wearing off and she was trying to pull probe out.  2. Left ventricular ejection fraction, by estimation, is 60 to 65%. The left ventricle has normal function. The left ventricle has no regional wall motion abnormalities.  3. Right ventricular systolic function is normal. The right ventricular size is normal.  4. No left atrial/left atrial appendage thrombus was detected.  5. The mitral valve is abnormal. Trivial mitral valve regurgitation. No evidence of mitral stenosis.  6. The aortic valve is normal in structure. Aortic valve regurgitation is not visualized. No aortic stenosis is present.  7. The inferior vena cava is normal in size with greater than 50% respiratory variability, suggesting right atrial pressure of 3 mmHg.  8. Evidence of atrial level shunting detected by color flow  Doppler. Conclusion(s)/Recommendation(s): Normal biventricular function without evidence of hemodynamically significant valvular heart disease. FINDINGS  Left Ventricle: Left ventricular ejection fraction, by estimation, is 60 to 65%. The left ventricle has normal function. The left ventricle has no regional wall motion abnormalities. The left ventricular internal cavity size was normal in size. There is  no left ventricular hypertrophy. Right Ventricle: The right ventricular size is normal. No increase in right ventricular wall thickness.  Right ventricular systolic function is normal. Left Atrium: Left atrial size was normal in size. No left atrial/left atrial appendage thrombus was detected. Right Atrium: Right atrial size was normal in size. Pericardium: There is no evidence of pericardial effusion. Mitral Valve: The mitral valve is abnormal. There is mild thickening of the mitral valve leaflet(s). There is mild calcification of the mitral valve leaflet(s). Trivial mitral valve regurgitation. No evidence of mitral valve stenosis. Tricuspid Valve: The tricuspid valve is normal in structure. Tricuspid valve regurgitation is trivial. No evidence of tricuspid stenosis. Aortic Valve: The aortic valve is normal in structure. Aortic valve regurgitation is not visualized. No aortic stenosis is present. Pulmonic Valve: The pulmonic valve was normal in structure. Pulmonic valve regurgitation is not visualized. No evidence of pulmonic stenosis. Aorta: The aortic root is normal in size and structure. Venous: The inferior vena cava is normal in size with greater than 50% respiratory variability, suggesting right atrial pressure of 3 mmHg. IAS/Shunts: Evidence of atrial level shunting detected by color flow Doppler. Additional Comments: Small PFO with positive bubble study Latter suboptimal as patiensts anesthesia was wearing off and she was trying to pull probe out. Jenkins Rouge MD Electronically signed by Jenkins Rouge MD  Signature Date/Time: 10/30/2021/1:54:37 PM    Final    ECHOCARDIOGRAM COMPLETE BUBBLE STUDY  Result Date: 10/27/2021    ECHOCARDIOGRAM REPORT   Patient Name:   Megan Chaney Date of Exam: 10/27/2021 Medical Rec #:  [id number]       Height:       63.0 in Accession #:    [id number]      Weight:       161.8 lb Date of Birth:  07-Dec-1970       BSA:          1.767 m Patient Age:    32 years        BP:           132/98 mmHg Patient Gender: F               HR:           97 bpm. Exam Location:  Inpatient Procedure: 2D Echo, Cardiac Doppler, Color Doppler and Intracardiac            Opacification Agent Indications:    Stroke, bubble  History:        Patient has no prior history of Echocardiogram examinations.  Sonographer:    Glo Herring Referring Phys: [place]  1. Left ventricular ejection fraction, by estimation, is 60 to 65%. The left ventricle has normal function. The left ventricle has no regional wall motion abnormalities. Left ventricular diastolic parameters were normal.  2. Right ventricular systolic function is normal. The right ventricular size is normal.  3. The mitral valve is normal in structure. No evidence of mitral valve regurgitation. No evidence of mitral stenosis.  4. The aortic valve is tricuspid. Aortic valve regurgitation is not visualized. No aortic stenosis is present.  5. The inferior vena cava is normal in size with greater than 50% respiratory variability, suggesting right atrial pressure of 3 mmHg.  6. Agitated saline contrast bubble study was negative, with no evidence of any interatrial shunt. FINDINGS  Left Ventricle: Left ventricular ejection fraction, by estimation, is 60 to 65%. The left ventricle has normal function. The left ventricle has no regional wall motion abnormalities. The left ventricular internal cavity size was normal in size. There is  no left ventricular hypertrophy.  Left ventricular diastolic parameters were normal. Right Ventricle: The right  ventricular size is normal. No increase in right ventricular wall thickness. Right ventricular systolic function is normal. Left Atrium: Left atrial size was normal in size. Right Atrium: Right atrial size was normal in size. Pericardium: There is no evidence of pericardial effusion. Mitral Valve: The mitral valve is normal in structure. No evidence of mitral valve regurgitation. No evidence of mitral valve stenosis. Tricuspid Valve: The tricuspid valve is normal in structure. Tricuspid valve regurgitation is trivial. No evidence of tricuspid stenosis. Aortic Valve: The aortic valve is tricuspid. Aortic valve regurgitation is not visualized. No aortic stenosis is present. Aortic valve mean gradient measures 5.0 mmHg. Aortic valve peak gradient measures 8.2 mmHg. Aortic valve area, by VTI measures 1.74 cm. Pulmonic Valve: The pulmonic valve was not well visualized. Pulmonic valve regurgitation is trivial. No evidence of pulmonic stenosis. Aorta: The aortic root is normal in size and structure. Venous: The inferior vena cava is normal in size with greater than 50% respiratory variability, suggesting right atrial pressure of 3 mmHg. IAS/Shunts: No atrial level shunt detected by color flow Doppler. Agitated saline contrast was given intravenously to evaluate for intracardiac shunting. Agitated saline contrast bubble study was negative, with no evidence of any interatrial shunt.  LEFT VENTRICLE PLAX 2D LVOT diam:     2.00 cm LV SV:         46 LV SV Index:   26 LVOT Area:     3.14 cm  IVC IVC diam: 1.30 cm LEFT ATRIUM           Index LA Vol (A2C): 31.9 ml 18.05 ml/m LA Vol (A4C): 48.3 ml 27.33 ml/m  AORTIC VALVE                     PULMONIC VALVE AV Area (Vmax):    1.83 cm      PV Vmax:       0.99 m/s AV Area (Vmean):   1.60 cm      PV Peak grad:  3.9 mmHg AV Area (VTI):     1.74 cm AV Vmax:           143.00 cm/s AV Vmean:          102.000 cm/s AV VTI:            0.265 m AV Peak Grad:      8.2 mmHg AV Mean Grad:       5.0 mmHg LVOT Vmax:         83.30 cm/s LVOT Vmean:        51.900 cm/s LVOT VTI:          0.147 m LVOT/AV VTI ratio: 0.55  AORTA Ao Root diam: 2.90 cm Ao Asc diam:  2.90 cm  SHUNTS Systemic VTI:  0.15 m Systemic Diam: 2.00 cm Kardie Tobb DO Electronically signed by Berniece Salines DO Signature Date/Time: 10/27/2021/3:32:16 PM    Final    CT HEAD CODE STROKE WO CONTRAST  Result Date: 10/26/2021 CLINICAL DATA:  Code stroke.  Right-sided weakness EXAM: CT HEAD WITHOUT CONTRAST TECHNIQUE: Contiguous axial images were obtained from the base of the skull through the vertex without intravenous contrast. COMPARISON:  None. FINDINGS: Brain: No evidence of acute infarction, hemorrhage, hydrocephalus, extra-axial collection or mass lesion/mass effect. Vascular: No hyperdense vessel or unexpected calcification. Skull: Normal. Negative for fracture or focal lesion. Sinuses/Orbits: No acute finding. Other: These results were communicated to [facility] at 10:00  am on 10/26/2021 by text page via the [facility] messaging system. ASPECTS [facility] Stroke Program Early CT Score) - Ganglionic level infarction (caudate, lentiform nuclei, internal capsule, insula, M1-M3 cortex): 7 - Supraganglionic infarction (M4-M6 cortex): 3 Total score (0-10 with 10 being normal): 10 IMPRESSION: No acute finding.  ASPECTS is 10. Electronically Signed   By: Jorje Guild M.D.   On: 10/26/2021 10:01   VAS [place] LOWER EXTREMITY VENOUS (DVT)  Result Date: 10/27/2021  Lower Venous DVT Study Patient Name:  Megan Chaney  Date of Exam:   10/27/2021 Medical Rec #: [id number]        Accession #:    [id number] Date of Birth: 26-Sep-1971        Patient Gender: F Patient Age:   72 years Exam Location:  [facility] Procedure:      VAS [place] LOWER EXTREMITY VENOUS (DVT) Referring Phys: Elwin Sleight [place] --------------------------------------------------------------------------------  Indications: Stroke.  Comparison Study: No prior studies. Performing Technologist: Darlin Coco RDMS, RVT  Examination Guidelines: A complete evaluation includes B-mode imaging, spectral Doppler, color Doppler, and power Doppler as needed of all accessible portions of each vessel. Bilateral testing is considered an integral part of a complete examination. Limited examinations for reoccurring indications may be performed as noted. The reflux portion of the exam is performed with the patient in reverse Trendelenburg.  +---------+---------------+---------+-----------+----------+--------------+  RIGHT     Compressibility Phasicity Spontaneity Properties Thrombus Aging  +---------+---------------+---------+-----------+----------+--------------+  CFV       Full            Yes       Yes                                    +---------+---------------+---------+-----------+----------+--------------+  SFJ       Full                                                             +---------+---------------+---------+-----------+----------+--------------+  FV Prox   Full                                                             +---------+---------------+---------+-----------+----------+--------------+  FV Mid    Full                                                             +---------+---------------+---------+-----------+----------+--------------+  FV Distal Full                                                             +---------+---------------+---------+-----------+----------+--------------+  PFV       Full                                                             +---------+---------------+---------+-----------+----------+--------------+  POP       Full            Yes       Yes                                    +---------+---------------+---------+-----------+----------+--------------+  PTV       Full                                                             +---------+---------------+---------+-----------+----------+--------------+  PERO      Full                                                              +---------+---------------+---------+-----------+----------+--------------+   +---------+---------------+---------+-----------+----------+--------------+  LEFT      Compressibility Phasicity Spontaneity Properties Thrombus Aging  +---------+---------------+---------+-----------+----------+--------------+  CFV       Full            Yes       Yes                                    +---------+---------------+---------+-----------+----------+--------------+  SFJ       Full                                                             +---------+---------------+---------+-----------+----------+--------------+  FV Prox   Full                                                             +---------+---------------+---------+-----------+----------+--------------+  FV Mid    Full                                                             +---------+---------------+---------+-----------+----------+--------------+  FV Distal Full                                                             +---------+---------------+---------+-----------+----------+--------------+  PFV       Full                                                             +---------+---------------+---------+-----------+----------+--------------+  POP       Full            Yes       Yes                                    +---------+---------------+---------+-----------+----------+--------------+  PTV       Full                                                             +---------+---------------+---------+-----------+----------+--------------+  PERO      Full                                                             +---------+---------------+---------+-----------+----------+--------------+     Summary: RIGHT: - There is no evidence of deep vein thrombosis in the lower extremity.  - No cystic structure found in the popliteal fossa.  LEFT: - There is no evidence of deep vein thrombosis in the lower extremity.  - No cystic structure found in the popliteal  fossa.  *See table(s) above for measurements and observations. Electronically signed by Deitra Mayo MD on 10/27/2021 at 5:57:09 PM.    Final    VAS [place] UPPER EXTREMITY VENOUS DUPLEX  Result Date: 11/01/2021 UPPER VENOUS STUDY  Patient Name:  YITTEL EMRICH  Date of Exam:   10/31/2021 Medical Rec #: [id number]        Accession #:    [id number] Date of Birth: 1970/11/03        Patient Gender: F Patient Age:   77 years Exam Location:  [facility] Procedure:      VAS [place] UPPER EXTREMITY VENOUS DUPLEX Referring Phys: Gwynneth Albright [facility] --------------------------------------------------------------------------------  Indications: Pain, swelling, erythema, palpable cord right arm Other Indications: Recent IV. Comparison Study: No prior upper extremity studies. Performing Technologist: Darlin Coco RDMS, RVT  Examination Guidelines: A complete evaluation includes B-mode imaging, spectral Doppler, color Doppler, and power Doppler as needed of all accessible portions of each vessel. Bilateral testing is considered an integral part of a complete examination. Limited examinations for reoccurring indications may be performed as noted.  Right Findings: +----------+------------+---------+-----------+----------+-------+  RIGHT      Compressible Phasicity Spontaneous Properties Summary  +----------+------------+---------+-----------+----------+-------+  IJV            Full        Yes        Yes                         +----------+------------+---------+-----------+----------+-------+  Subclavian                 Yes        Yes                         +----------+------------+---------+-----------+----------+-------+  Axillary       Full                                               +----------+------------+---------+-----------+----------+-------+  Brachial       Full                                               +----------+------------+---------+-----------+----------+-------+  Radial       Partial                                       Acute   +----------+------------+---------+-----------+----------+-------+  Ulnar          Full                                               +----------+------------+---------+-----------+----------+-------+  Cephalic       None        No         No                  Acute   +----------+------------+---------+-----------+----------+-------+  Basilic        Full                                               +----------+------------+---------+-----------+----------+-------+ High bifurcation of the brachial artery with non-compressibility of a single radial vein in the upper arm as well as the forearm.  Left Findings: +----------+------------+---------+-----------+----------+-------+  LEFT       Compressible Phasicity Spontaneous Properties Summary  +----------+------------+---------+-----------+----------+-------+  Subclavian                 Yes        Yes                         +----------+------------+---------+-----------+----------+-------+  Summary:  Right: Findings consistent with acute deep vein thrombosis involving the right radial veins. Findings consistent with acute superficial vein thrombosis involving the right cephalic vein.  *See table(s) above for measurements and observations.  Diagnosing physician: Orlie Pollen Electronically signed by Orlie Pollen on 11/01/2021 at 2:32:27 PM.    Final    CT ANGIO HEAD NECK W WO CM W PERF (CODE STROKE)  Result Date: 10/26/2021 CLINICAL DATA:  Right-sided weakness EXAM: CT ANGIOGRAPHY HEAD AND NECK CT PERFUSION BRAIN TECHNIQUE: Multidetector CT imaging of the head and neck was performed using the standard protocol during bolus administration of intravenous contrast. Multiplanar CT image reconstructions and MIPs were obtained to evaluate the vascular anatomy. Carotid stenosis measurements (when applicable) are obtained utilizing NASCET criteria, using the distal internal carotid diameter as the denominator. Multiphase CT imaging of the brain was performed  following IV bolus contrast injection. Subsequent parametric perfusion maps were calculated using RAPID software. CONTRAST:  Dose is not known on this in progress study COMPARISON:  None. FINDINGS: CTA NECK FINDINGS Aortic arch: Normal Right carotid system: Vessels are smooth and widely patent Left carotid system: Vessels are smooth and widely patent Vertebral arteries: No proximal subclavian stenosis. Skeleton: Negative Other neck: No acute finding Upper chest: No acute finding Review of the MIP images confirms the above findings CTA HEAD FINDINGS Anterior circulation: No emergent large vessel  occlusion. No proximal flow limiting stenosis, beading, or aneurysm Posterior circulation:  No emergent finding Venous sinuses: Unremarkable Review of the MIP images confirms the above findings CT Brain Perfusion Findings: ASPECTS: 10 CBF (<30%) Volume: 34mL Perfusion (Tmax>6.0s) volume: 46mL. With a 4 second threshold there is and even greater area of diminished perfusion in the left cerebral hemisphere, question shower of emboli or autolysis. These results were called by telephone at the time of interpretation on 10/26/2021 at 10:15 am to provider [facility] , who verbally acknowledged these results. IMPRESSION: 1. No emergent large vessel occlusion. 2. CT perfusion shows ischemic range blood flow in the left frontal lobe, suspect peripheral emboli. 3. No embolic source or significant atheromatous disease seen in the neck. Electronically Signed   By: Jorje Guild M.D.   On: 10/26/2021 10:18      Subjective: No complaints or issues  Discharge Exam: Vitals:   11/03/21 0429 11/03/21 0728  BP: 92/76 97/72  Pulse: 97 90  Resp: 18 17  Temp: 98.2 F (36.8 C) (!) 97.2 F (36.2 C)  SpO2: 100% 100%   Vitals:   11/02/21 1656 11/02/21 2312 11/03/21 0429 11/03/21 0728  BP: 123/84 122/81 92/76 97/72   Pulse: (!) 102 (!) 105 97 90  Resp: 12 16 18 17   Temp: 98.1 F (36.7 C) (!) 97.5 F (36.4 C) 98.2 F (36.8 C)  (!) 97.2 F (36.2 C)  TempSrc: Oral Oral Oral Oral  SpO2: 100% 99% 100% 100%  Weight:      Height:        General: Pt is alert, awake, not in acute distress Cardiovascular: RRR, S1/S2 +, no rubs, no gallops Respiratory: CTA bilaterally, no wheezing, no rhonchi Abdominal: Soft, NT, ND, bowel sounds + Extremities: no edema, no cyanosis    The results of significant diagnostics from this hospitalization (including imaging, microbiology, ancillary and laboratory) are listed below for reference.     Microbiology: Recent Results (from the past 240 hour(s))  Resp Panel by RT-PCR (Flu A&B, Covid) Nasopharyngeal Swab     Status: None   Collection Time: 10/27/21  3:55 PM   Specimen: Nasopharyngeal Swab; Nasopharyngeal(NP) swabs in vial transport medium  Result Value Ref Range Status   SARS Coronavirus 2 by RT PCR NEGATIVE NEGATIVE Final    Comment: (NOTE) SARS-CoV-2 target nucleic acids are NOT DETECTED.  The SARS-CoV-2 RNA is generally detectable in upper respiratory specimens during the acute phase of infection. The lowest concentration of SARS-CoV-2 viral copies this assay can detect is 138 copies/mL. A negative result does not preclude SARS-Cov-2 infection and should not be used as the sole basis for treatment or other patient management decisions. A negative result may occur with  improper specimen collection/handling, submission of specimen other than nasopharyngeal swab, presence of viral mutation(s) within the areas targeted by this assay, and inadequate number of viral copies(<138 copies/mL). A negative result must be combined with clinical observations, patient history, and epidemiological information. The expected result is Negative.  Fact Sheet for Patients:  EntrepreneurPulse.com.au  Fact Sheet for Healthcare Providers:  IncredibleEmployment.be  This test is no t yet approved or cleared by the [place] FDA and  has been  authorized for detection and/or diagnosis of SARS-CoV-2 by FDA under an Emergency Use Authorization (EUA). This EUA will remain  in effect (meaning this test can be used) for the duration of the COVID-19 declaration under Section 564(b)(1) of the Act, 21 U.S.C.section 360bbb-3(b)(1), unless the authorization is terminated  or revoked sooner.  Influenza A by PCR NEGATIVE NEGATIVE Final   Influenza B by PCR NEGATIVE NEGATIVE Final    Comment: (NOTE) The Xpert Xpress SARS-CoV-2/FLU/RSV plus assay is intended as an aid in the diagnosis of influenza from Nasopharyngeal swab specimens and should not be used as a sole basis for treatment. Nasal washings and aspirates are unacceptable for Xpert Xpress SARS-CoV-2/FLU/RSV testing.  Fact Sheet for Patients: EntrepreneurPulse.com.au  Fact Sheet for Healthcare Providers: IncredibleEmployment.be  This test is not yet approved or cleared by the [place] FDA and has been authorized for detection and/or diagnosis of SARS-CoV-2 by FDA under an Emergency Use Authorization (EUA). This EUA will remain in effect (meaning this test can be used) for the duration of the COVID-19 declaration under Section 564(b)(1) of the Act, 21 U.S.C. section 360bbb-3(b)(1), unless the authorization is terminated or revoked.  Performed at [facility], [place] [address], [place], [place] [address]   Resp Panel by RT-PCR (Flu A&B, Covid) Nasopharyngeal Swab     Status: None   Collection Time: 11/02/21 12:19 PM   Specimen: Nasopharyngeal Swab; Nasopharyngeal(NP) swabs in vial transport medium  Result Value Ref Range Status   SARS Coronavirus 2 by RT PCR NEGATIVE NEGATIVE Final    Comment: (NOTE) SARS-CoV-2 target nucleic acids are NOT DETECTED.  The SARS-CoV-2 RNA is generally detectable in upper respiratory specimens during the acute phase of infection. The lowest concentration of SARS-CoV-2 viral copies this assay  can detect is 138 copies/mL. A negative result does not preclude SARS-Cov-2 infection and should not be used as the sole basis for treatment or other patient management decisions. A negative result may occur with  improper specimen collection/handling, submission of specimen other than nasopharyngeal swab, presence of viral mutation(s) within the areas targeted by this assay, and inadequate number of viral copies(<138 copies/mL). A negative result must be combined with clinical observations, patient history, and epidemiological information. The expected result is Negative.  Fact Sheet for Patients:  EntrepreneurPulse.com.au  Fact Sheet for Healthcare Providers:  IncredibleEmployment.be  This test is no t yet approved or cleared by the [place] FDA and  has been authorized for detection and/or diagnosis of SARS-CoV-2 by FDA under an Emergency Use Authorization (EUA). This EUA will remain  in effect (meaning this test can be used) for the duration of the COVID-19 declaration under Section 564(b)(1) of the Act, 21 U.S.C.section 360bbb-3(b)(1), unless the authorization is terminated  or revoked sooner.       Influenza A by PCR NEGATIVE NEGATIVE Final   Influenza B by PCR NEGATIVE NEGATIVE Final    Comment: (NOTE) The Xpert Xpress SARS-CoV-2/FLU/RSV plus assay is intended as an aid in the diagnosis of influenza from Nasopharyngeal swab specimens and should not be used as a sole basis for treatment. Nasal washings and aspirates are unacceptable for Xpert Xpress SARS-CoV-2/FLU/RSV testing.  Fact Sheet for Patients: EntrepreneurPulse.com.au  Fact Sheet for Healthcare Providers: IncredibleEmployment.be  This test is not yet approved or cleared by the [place] FDA and has been authorized for detection and/or diagnosis of SARS-CoV-2 by FDA under an Emergency Use Authorization (EUA). This EUA will  remain in effect (meaning this test can be used) for the duration of the COVID-19 declaration under Section 564(b)(1) of the Act, 21 U.S.C. section 360bbb-3(b)(1), unless the authorization is terminated or revoked.  Performed at [facility], [place] [address], [place], [place] [address]      Labs: BNP (last 3 results) No results for input(s): BNP in  the last 8760 hours. Basic Metabolic Panel: Recent Labs  Lab 10/31/21 0941  NA 138  K 3.6  CL 109  CO2 19*  GLUCOSE 98  BUN 15  CREATININE 0.63  CALCIUM 8.8*   Liver Function Tests: No results for input(s): AST, ALT, ALKPHOS, BILITOT, PROT, ALBUMIN in the last 168 hours. No results for input(s): LIPASE, AMYLASE in the last 168 hours. No results for input(s): AMMONIA in the last 168 hours. CBC: Recent Labs  Lab 10/28/21 0300 10/29/21 1628 11/01/21 0203 11/03/21 0148  WBC 10.9*  --  9.8 9.1  HGB 7.5* 8.5* 8.9* 9.2*  HCT 23.4* 26.8* 28.7* 29.5*  MCV 82.1  --  87.0 87.8  PLT 389  --  460* 461*   Cardiac Enzymes: No results for input(s): CKTOTAL, CKMB, CKMBINDEX, TROPONINI in the last 168 hours. BNP: Invalid input(s): POCBNP CBG: No results for input(s): GLUCAP in the last 168 hours. D-Dimer No results for input(s): DDIMER in the last 72 hours. Hgb A1c No results for input(s): HGBA1C in the last 72 hours. Lipid Profile No results for input(s): CHOL, HDL, LDLCALC, TRIG, CHOLHDL, LDLDIRECT in the last 72 hours. Thyroid function studies No results for input(s): TSH, T4TOTAL, T3FREE, THYROIDAB in the last 72 hours.  Invalid input(s): FREET3 Anemia work up No results for input(s): VITAMINB12, FOLATE, FERRITIN, TIBC, IRON, RETICCTPCT in the last 72 hours. Urinalysis    Component Value Date/Time   COLORURINE YELLOW 07/28/2018 1328   APPEARANCEUR CLOUDY (A) 07/28/2018 1328   LABSPEC 1.024 07/28/2018 1328   PHURINE 7.0 07/28/2018 1328   GLUCOSEU NEGATIVE 07/28/2018 1328   HGBUR LARGE (A) 07/28/2018 1328    BILIRUBINUR NEGATIVE 07/28/2018 1328   KETONESUR NEGATIVE 07/28/2018 1328   PROTEINUR 100 (A) 07/28/2018 1328   UROBILINOGEN 0.2 01/27/2013 1542   NITRITE NEGATIVE 07/28/2018 1328   LEUKOCYTESUR TRACE (A) 07/28/2018 1328   Sepsis Labs Invalid input(s): PROCALCITONIN,  WBC,  LACTICIDVEN Microbiology Recent Results (from the past 240 hour(s))  Resp Panel by RT-PCR (Flu A&B, Covid) Nasopharyngeal Swab     Status: None   Collection Time: 10/27/21  3:55 PM   Specimen: Nasopharyngeal Swab; Nasopharyngeal(NP) swabs in vial transport medium  Result Value Ref Range Status   SARS Coronavirus 2 by RT PCR NEGATIVE NEGATIVE Final    Comment: (NOTE) SARS-CoV-2 target nucleic acids are NOT DETECTED.  The SARS-CoV-2 RNA is generally detectable in upper respiratory specimens during the acute phase of infection. The lowest concentration of SARS-CoV-2 viral copies this assay can detect is 138 copies/mL. A negative result does not preclude SARS-Cov-2 infection and should not be used as the sole basis for treatment or other patient management decisions. A negative result may occur with  improper specimen collection/handling, submission of specimen other than nasopharyngeal swab, presence of viral mutation(s) within the areas targeted by this assay, and inadequate number of viral copies(<138 copies/mL). A negative result must be combined with clinical observations, patient history, and epidemiological information. The expected result is Negative.  Fact Sheet for Patients:  EntrepreneurPulse.com.au  Fact Sheet for Healthcare Providers:  IncredibleEmployment.be  This test is no t yet approved or cleared by the [place] FDA and  has been authorized for detection and/or diagnosis of SARS-CoV-2 by FDA under an Emergency Use Authorization (EUA). This EUA will remain  in effect (meaning this test can be used) for the duration of the COVID-19 declaration under  Section 564(b)(1) of the Act, 21 U.S.C.section 360bbb-3(b)(1), unless the authorization is terminated  or revoked  sooner.       Influenza A by PCR NEGATIVE NEGATIVE Final   Influenza B by PCR NEGATIVE NEGATIVE Final    Comment: (NOTE) The Xpert Xpress SARS-CoV-2/FLU/RSV plus assay is intended as an aid in the diagnosis of influenza from Nasopharyngeal swab specimens and should not be used as a sole basis for treatment. Nasal washings and aspirates are unacceptable for Xpert Xpress SARS-CoV-2/FLU/RSV testing.  Fact Sheet for Patients: EntrepreneurPulse.com.au  Fact Sheet for Healthcare Providers: IncredibleEmployment.be  This test is not yet approved or cleared by the [place] FDA and has been authorized for detection and/or diagnosis of SARS-CoV-2 by FDA under an Emergency Use Authorization (EUA). This EUA will remain in effect (meaning this test can be used) for the duration of the COVID-19 declaration under Section 564(b)(1) of the Act, 21 U.S.C. section 360bbb-3(b)(1), unless the authorization is terminated or revoked.  Performed at [facility], [place] [address] [place], [place] [address]   Resp Panel by RT-PCR (Flu A&B, Covid) Nasopharyngeal Swab     Status: None   Collection Time: 11/02/21 12:19 PM   Specimen: Nasopharyngeal Swab; Nasopharyngeal(NP) swabs in vial transport medium  Result Value Ref Range Status   SARS Coronavirus 2 by RT PCR NEGATIVE NEGATIVE Final    Comment: (NOTE) SARS-CoV-2 target nucleic acids are NOT DETECTED.  The SARS-CoV-2 RNA is generally detectable in upper respiratory specimens during the acute phase of infection. The lowest concentration of SARS-CoV-2 viral copies this assay can detect is 138 copies/mL. A negative result does not preclude SARS-Cov-2 infection and should not be used as the sole basis for treatment or other patient management decisions. A negative result may occur with   improper specimen collection/handling, submission of specimen other than nasopharyngeal swab, presence of viral mutation(s) within the areas targeted by this assay, and inadequate number of viral copies(<138 copies/mL). A negative result must be combined with clinical observations, patient history, and epidemiological information. The expected result is Negative.  Fact Sheet for Patients:  EntrepreneurPulse.com.au  Fact Sheet for Healthcare Providers:  IncredibleEmployment.be  This test is no t yet approved or cleared by the [place] FDA and  has been authorized for detection and/or diagnosis of SARS-CoV-2 by FDA under an Emergency Use Authorization (EUA). This EUA will remain  in effect (meaning this test can be used) for the duration of the COVID-19 declaration under Section 564(b)(1) of the Act, 21 U.S.C.section 360bbb-3(b)(1), unless the authorization is terminated  or revoked sooner.       Influenza A by PCR NEGATIVE NEGATIVE Final   Influenza B by PCR NEGATIVE NEGATIVE Final    Comment: (NOTE) The Xpert Xpress SARS-CoV-2/FLU/RSV plus assay is intended as an aid in the diagnosis of influenza from Nasopharyngeal swab specimens and should not be used as a sole basis for treatment. Nasal washings and aspirates are unacceptable for Xpert Xpress SARS-CoV-2/FLU/RSV testing.  Fact Sheet for Patients: EntrepreneurPulse.com.au  Fact Sheet for Healthcare Providers: IncredibleEmployment.be  This test is not yet approved or cleared by the [place] FDA and has been authorized for detection and/or diagnosis of SARS-CoV-2 by FDA under an Emergency Use Authorization (EUA). This EUA will remain in effect (meaning this test can be used) for the duration of the COVID-19 declaration under Section 564(b)(1) of the Act, 21 U.S.C. section 360bbb-3(b)(1), unless the authorization is terminated  or revoked.  Performed at [facility], [place] [address], [place], [place] [address]      Time coordinating discharge: Over  30 minutes  SIGNED:   Nolberto Hanlon, MD  [facility] 11/03/2021, 8:36 AM Pager   If 7PM-7AM, please contact night-coverage [email or web address] Password [email or web address]

## 2021-11-06 ENCOUNTER — Encounter: Payer: BLUE CROSS/BLUE SHIELD | Admitting: Physician Assistant

## 2021-11-11 ENCOUNTER — Other Ambulatory Visit: Payer: Self-pay

## 2021-11-11 NOTE — Progress Notes (Signed)
Received hospital discharge paperwork from MSW - Margreta Journey at [facility] on 11/11/2021 requesting f/u appt with Dr. Burr Medico.  Spoke with Dr. Burr Medico and she said to have scheduling to schedule a f/u appt with her in 2 to 3 wks.  Sent scheduling an appt request message stating Dr Ernestina Penna request.

## 2021-11-12 ENCOUNTER — Telehealth: Payer: Self-pay | Admitting: Hematology

## 2021-11-12 NOTE — Telephone Encounter (Signed)
Sch per 1/18 inbasket,left msg

## 2021-11-26 ENCOUNTER — Inpatient Hospital Stay: Payer: BLUE CROSS/BLUE SHIELD | Admitting: Hematology

## 2021-11-26 ENCOUNTER — Other Ambulatory Visit: Payer: Self-pay

## 2021-11-26 ENCOUNTER — Inpatient Hospital Stay: Payer: BLUE CROSS/BLUE SHIELD

## 2021-11-28 NOTE — Progress Notes (Addendum)
Cardiology Office Note Date:  11/28/2021  Patient ID:  Megan, Chaney 1970/12/03, MRN [id number] PCP:  Michael Boston, MD  Electrophysiologist: Dr. Quentin Ore    Chief Complaint:  wound check  History of Present Illness: Megan Chaney is a 51 y.o. female with history of cryptogenic stroke > loop  She was admitted 10/26/21 with expressive aphasia/L hemiparesis and dx with  Left middle cerebral artery branch infarct likely secondary due to embolism of cryptogenic source neurology recommended EP consulted for consideration of loop recorder.  She underwent loop insertion 10/30/21.   Pending rehab bed the following day developed R UE pain, swelling and noted to have R radial vein DVT and superficial thrombus R cephalic vein, hematology was consulted, felt that the DVT was provoked 2/2 IV, though given stroke and PFO, with DVT did recommend 70mo [place]  She comes in today to be seen for Dr. Quentin Ore, s/p ILR implant  Today She is doing quite well, completed CIR, active at home pending some home PT to start as well She will occasionally feel lightheaded or weak with standing quickly or in a hot shower, no syncope. No cardiac awareness No bleeding or signs of bleeding   Device information MDT LNQII implanted 10/30/21   Past Medical History:  Diagnosis Date   Anemia    Fibroid    GERD (gastroesophageal reflux disease)     Past Surgical History:  Procedure Laterality Date   BUBBLE STUDY  10/30/2021   Procedure: BUBBLE STUDY;  Surgeon: Josue Hector, MD;  Location: [facility] ENDOSCOPY;  Service: Cardiovascular;;   LOOP RECORDER INSERTION N/A 10/30/2021   Procedure: LOOP RECORDER INSERTION;  Surgeon: Vickie Epley, MD;  Location: [place] CV LAB;  Service: Cardiovascular;  Laterality: N/A;   MULTIPLE TOOTH EXTRACTIONS     NO PAST SURGERIES     TEE WITHOUT CARDIOVERSION N/A 10/30/2021   Procedure: TRANSESOPHAGEAL ECHOCARDIOGRAM (TEE);  Surgeon: Josue Hector, MD;  Location: [facility] ENDOSCOPY;   Service: Cardiovascular;  Laterality: N/A;    Current Outpatient Medications  Medication Sig Dispense Refill   acetaminophen (TYLENOL) 325 MG tablet Take 2 tablets (650 mg total) by mouth every 4 (four) hours as needed for mild pain or headache (or temp > 37.5 C (99.5 F)).     apixaban (ELIQUIS) 5 MG TABS tablet Take 1 tablet (5 mg total) by mouth 2 (two) times daily. 60 tablet    atorvastatin (LIPITOR) 40 MG tablet Take 1 tablet (40 mg total) by mouth daily.     calcium-vitamin D (OSCAL 500/200 D-3) 500-200 MG-UNIT tablet Take 1 tablet by mouth 2 (two) times daily. 180 tablet 3   Cholecalciferol (VITAMIN D3) 1.25 MG (50000 UT) CAPS Take 50,000 Units by mouth once a week.     ferrous sulfate 325 (65 FE) MG tablet TAKE 1 TABLET (325 MG TOTAL) BY MOUTH 3 (THREE) TIMES DAILY WITH MEALS. 270 tablet 1   megestrol (MEGACE) 40 MG tablet TAKE 1 TABLET BY MOUTH TWICE A DAY (Patient taking differently: Take 40 mg by mouth 2 (two) times daily.) 180 tablet 1   metoprolol succinate (TOPROL-XL) 25 MG 24 hr tablet Take 0.5 tablets (12.5 mg total) by mouth daily.     senna-docusate (SENOKOT-S) 8.6-50 MG tablet Take 1 tablet by mouth at bedtime.     No current facility-administered medications for this visit.    Allergies:   Latex and Other   Social History:  The patient  reports that she quit smoking about 22  years ago. She has never used smokeless tobacco. She reports current alcohol use. She reports that she does not currently use drugs after having used the following drugs: Marijuana.   Family History:  The patient's family history includes Cancer in her mother; Chronic Renal Failure in her father; Diabetes in her mother; Heart attack in her mother; Miscarriages / Stillbirths in her father; Stroke in her mother.  ROS:  Please see the history of present illness.    All other systems are reviewed and otherwise negative.   PHYSICAL EXAM:  VS:  There were no vitals taken for this visit. BMI: There is no  height or weight on file to calculate BMI. Well nourished, well developed, in no acute distress HEENT: normocephalic, atraumatic Neck: no JVD, carotid bruits or masses Cardiac:  RRR; no significant murmurs, no rubs, or gallops Lungs:  CTA b/l, no wheezing, rhonchi or rales Abd: soft, nontender MS: no deformity or atrophy Ext: no edema Skin: warm and dry, no rash Neuro:  No gross deficits appreciated Psych: euthymic mood, full affect  ILR site : steri strips remain, removed without difficulty, wound is well healed, no erythema, edema, tenderness, no heat, no signs of infection, appears well healed   EKG:  not done today    Device interrogation done today and reviewed by myself:  Battery is good R waves 0.30 mV ST 111bpm, PVC  No episodes PVC burden is 1%   10/30/21: TEE IMPRESSIONS   1. Small PFO with positive bubble study Latter suboptimal as patiensts  anesthesia was wearing off and she was trying to pull probe out.   2. Left ventricular ejection fraction, by estimation, is 60 to 65%. The  left ventricle has normal function. The left ventricle has no regional  wall motion abnormalities.   3. Right ventricular systolic function is normal. The right ventricular  size is normal.   4. No left atrial/left atrial appendage thrombus was detected.   5. The mitral valve is abnormal. Trivial mitral valve regurgitation. No  evidence of mitral stenosis.   6. The aortic valve is normal in structure. Aortic valve regurgitation is  not visualized. No aortic stenosis is present.   7. The inferior vena cava is normal in size with greater than 50%  respiratory variability, suggesting right atrial pressure of 3 mmHg.   8. Evidence of atrial level shunting detected by color flow Doppler.   Conclusion(s)/Recommendation(s): Normal biventricular function without  evidence of hemodynamically significant valvular heart disease.    10/27/21: LE venous US Summary:  RIGHT:  - There is no  evidence of deep vein thrombosis in the lower extremity. - No cystic structure found in the popliteal fossa.  LEFT:  - There is no evidence of deep vein thrombosis in the lower extremity.  - No cystic structure found in the popliteal fossa.    Recent Labs: 10/26/2021: ALT 25; TSH 4.901 10/31/2021: BUN 15; Creatinine, Ser 0.63; Potassium 3.6; Sodium 138 11/03/2021: Hemoglobin 9.2; Platelets 461  10/26/2021: Direct LDL 86.3 10/27/2021: Cholesterol 145; HDL 52; LDL Cholesterol 79; Total CHOL/HDL Ratio 2.8; Triglycerides 69; VLDL 14   CrCl cannot be calculated (Patient's most recent lab result is older than the maximum 21 days allowed.).   Wt Readings from Last 3 Encounters:  10/26/21 161 lb 13.1 oz (73.4 kg)  10/04/19 153 lb 3.2 oz (69.5 kg)  07/20/19 160 lb (72.6 kg)     Other studies reviewed: Additional studies/records reviewed today include: summarized above  ASSESSMENT AND PLAN:  ILR Cryptogenic stroke Subsequent DVR R radial vein, provoked. Started on a 73mo course of [place] given PFO  Dr. Burt Knack was kind enough to review her char/TEE, unconvinced of PFO, and given negative TCD study, did not feel there was any role for PFO closure She will follow up with her PMD and neurology teams  ST 110's, she was rushing, went to the wrong building, can follow histograms via remotes  Teaching completed We will see her PRN in clinic and plan monthly remotes as usual, she has a transmitter at home   Disposition: as above  Current medicines are reviewed at length with the patient today.  The patient did not have any concerns regarding medicines.  Megan Night, PA-C 11/28/2021 7:56 AM     [facility] [address] [place] [address] [phone] (office)  [phone] (fax)

## 2021-11-30 ENCOUNTER — Encounter: Payer: Self-pay | Admitting: Physician Assistant

## 2021-11-30 ENCOUNTER — Other Ambulatory Visit: Payer: Self-pay

## 2021-11-30 ENCOUNTER — Ambulatory Visit (INDEPENDENT_AMBULATORY_CARE_PROVIDER_SITE_OTHER): Payer: BLUE CROSS/BLUE SHIELD | Admitting: Physician Assistant

## 2021-11-30 VITALS — BP 110/64 | HR 114 | Ht 63.0 in | Wt 155.0 lb

## 2021-11-30 DIAGNOSIS — Z4509 Encounter for adjustment and management of other cardiac device: Secondary | ICD-10-CM | POA: Diagnosis not present

## 2021-11-30 DIAGNOSIS — Z5189 Encounter for other specified aftercare: Secondary | ICD-10-CM

## 2021-11-30 NOTE — Patient Instructions (Signed)
Medication Instructions:   Your physician recommends that you continue on your current medications as directed. Please refer to the Current Medication list given to you today.    *If you need a refill on your cardiac medications before your next appointment, please call your pharmacy*   Lab Work: [place]   If you have labs (blood work) drawn today and your tests are completely normal, you will receive your results only by: [place] (if you have MyChart) OR A paper copy in the mail If you have any lab test that is abnormal or we need to change your treatment, we will call you to review the results.   Testing/Procedures: NONE ORDERED  TODAY    Follow-Up: At [facility], you and your health needs are our priority.  As part of our continuing mission to provide you with exceptional heart care, we have created designated Provider Care Teams.  These Care Teams include your primary Cardiologist (physician) and Advanced Practice Providers (APPs -  Physician Assistants and Nurse Practitioners) who all work together to provide you with the care you need, when you need it.  We recommend signing up for the patient portal called "MyChart".  Sign up information is provided on this After Visit Summary.  MyChart is used to connect with patients for Virtual Visits (Telemedicine).  Patients are able to view lab/test results, encounter notes, upcoming appointments, etc.  Non-urgent messages can be sent to your provider as well.   To learn more about what you can do with MyChart, go to [email or web address].    Your next appointment:  CONTACT [facility] [phone] AS NEEDED FOR  ANY CARDIAC RELATED SYMPTOMS   The format for your next appointment:   In Person  Provider:   You may see Tommye Standard, PA-C Legrand Como "Oda Kilts, PA-C{     Other Instructions

## 2021-12-03 ENCOUNTER — Other Ambulatory Visit: Payer: Self-pay

## 2021-12-08 ENCOUNTER — Telehealth: Payer: Self-pay | Admitting: Hematology

## 2021-12-08 NOTE — Telephone Encounter (Signed)
Called pt to sch a hospital f/u appt per 2/13 staff msg from Dr. Burr Medico. Spoke to pt who declined to sch this appt at this time. She informed me she would need to speak to her insurance company about coverage before scheduling appts. I gave her my number to call back if/when she was ready to sch and informed Dr. Burr Medico of pt's decision.

## 2021-12-21 ENCOUNTER — Other Ambulatory Visit: Payer: Self-pay

## 2021-12-21 ENCOUNTER — Encounter (HOSPITAL_COMMUNITY): Payer: Self-pay | Admitting: Emergency Medicine

## 2021-12-21 ENCOUNTER — Ambulatory Visit (INDEPENDENT_AMBULATORY_CARE_PROVIDER_SITE_OTHER): Payer: BLUE CROSS/BLUE SHIELD

## 2021-12-21 ENCOUNTER — Ambulatory Visit (HOSPITAL_COMMUNITY)
Admission: EM | Admit: 2021-12-21 | Discharge: 2021-12-21 | Disposition: A | Discharge status: Home / Self Care | Payer: BLUE CROSS/BLUE SHIELD | Attending: Family Medicine | Admitting: Family Medicine

## 2021-12-21 DIAGNOSIS — N938 Other specified abnormal uterine and vaginal bleeding: Secondary | ICD-10-CM | POA: Diagnosis not present

## 2021-12-21 DIAGNOSIS — N939 Abnormal uterine and vaginal bleeding, unspecified: Secondary | ICD-10-CM | POA: Diagnosis not present

## 2021-12-21 DIAGNOSIS — Z4509 Encounter for adjustment and management of other cardiac device: Secondary | ICD-10-CM

## 2021-12-21 LAB — COMPREHENSIVE METABOLIC PANEL
ALT: 14 U/L (ref 0–44)
AST: 16 U/L (ref 15–41)
Albumin: 4.1 g/dL (ref 3.5–5.0)
Alkaline Phosphatase: 56 U/L (ref 38–126)
Anion gap: 9 (ref 5–15)
BUN: 13 mg/dL (ref 6–20)
CO2: 23 mmol/L (ref 22–32)
Calcium: 8.9 mg/dL (ref 8.9–10.3)
Chloride: 109 mmol/L (ref 98–111)
Creatinine, Ser: 0.71 mg/dL (ref 0.44–1.00)
GFR, Estimated: >60 mL/min (ref >60)
Glucose, Bld: 110 mg/dL — ABNORMAL HIGH (ref 70–99)
Potassium: 3.7 mmol/L (ref 3.5–5.1)
Sodium: 141 mmol/L (ref 135–145)
Total Bilirubin: 0.3 mg/dL (ref 0.3–1.2)
Total Protein: 6.8 g/dL (ref 6.5–8.1)

## 2021-12-21 LAB — CBC WITH DIFFERENTIAL/PLATELET
Abs Immature Granulocytes: 0.03 10*3/uL (ref 0.00–0.07)
Basophils Absolute: 0 10*3/uL (ref 0.0–0.1)
Basophils Relative: 0 %
Eosinophils Absolute: 0 10*3/uL (ref 0.0–0.5)
Eosinophils Relative: 0 %
HCT: 26.8 % — ABNORMAL LOW (ref 36.0–46.0)
Hemoglobin: 8 g/dL — ABNORMAL LOW (ref 12.0–15.0)
Immature Granulocytes: 0 %
Lymphocytes Relative: 23 %
Lymphs Abs: 2.2 10*3/uL (ref 0.7–4.0)
MCH: 27.6 pg (ref 26.0–34.0)
MCHC: 29.9 g/dL — ABNORMAL LOW (ref 30.0–36.0)
MCV: 92.4 fL (ref 80.0–100.0)
Monocytes Absolute: 0.6 10*3/uL (ref 0.1–1.0)
Monocytes Relative: 7 %
Neutro Abs: 6.5 10*3/uL (ref 1.7–7.7)
Neutrophils Relative %: 70 %
Platelets: 364 10*3/uL (ref 150–400)
RBC: 2.9 MIL/uL — ABNORMAL LOW (ref 3.87–5.11)
RDW: 15.3 % (ref 11.5–15.5)
WBC: 9.4 10*3/uL (ref 4.0–10.5)
nRBC: 0 % (ref 0.0–0.2)

## 2021-12-21 NOTE — ED Triage Notes (Signed)
Pt reports vaginal bleeding that has been ongoing for a couple weeks. Pt states hx of fibroids and also reports anticoagulation use related to a recent stroke.

## 2021-12-21 NOTE — Discharge Instructions (Signed)
You were seen today for heavy vaginal bleeding.  Your vitals are stable.  Your blood work does show slight anemia, but is not too low for concern at this time.  I have reached out to your ob/gyn for their input, but have not heard back.  Either myself or they will reach out to you directly for further instruction.  If you do not hear from anyone today, then please call your ob/gyn directly.

## 2021-12-21 NOTE — ED Provider Notes (Signed)
[place]    CSN: [id number] Arrival date & time: 12/21/21  3500      History   Chief Complaint Chief Complaint  Patient presents with   Vaginal Bleeding    HPI Megan Chaney is a 51 y.o. female.   Patient is here for vaginal bleeding.  She had a stroke in January, and placed on eliquis.  She has had bleeding off/on the last month since being on the eliquis.  It has been particularly heavy the last 2 weeks.  She is using wash clothes all weekend as the heavy pads just were not enough.  She went through 3 bags of the heavy pads over 2 week time period, and then switched to the wash cloth.  She was on medroxyprogesterone 5mg  daily since November to help with her fibroids.  She states she was not having bleeding issues prior to the stroke.   She was discussing a hysterectomy with her ob/gyn, but had the stroke before she could do that.  She is having dizziness and light headedness.  She was bed all day Saturday.   She was told to restart the medroxyprogesterone by her pcp.  She did take a dose, and she felt poorly so she didn't take it.  The blood is thin, like "water" heavy.    Ob/gyn is dr ty Quentin [place], [place].   Past Medical History:  Diagnosis Date   Anemia    Fibroid    GERD (gastroesophageal reflux disease)     Patient Active Problem List   Diagnosis Date Noted   Acute ischemic stroke ([place]) 10/26/2021   Infertility counseling 10/04/2019   Enlarged uterus 07/31/2018   Uterine leiomyoma 07/31/2018   DUB (dysfunctional uterine bleeding) 07/31/2018    Past Surgical History:  Procedure Laterality Date   BUBBLE STUDY  10/30/2021   Procedure: BUBBLE STUDY;  Surgeon: Josue Hector, MD;  Location: [facility] ENDOSCOPY;  Service: Cardiovascular;;   LOOP RECORDER INSERTION N/A 10/30/2021   Procedure: LOOP RECORDER INSERTION;  Surgeon: Vickie Epley, MD;  Location: [place] [facility];  Service: Cardiovascular;  Laterality: N/A;   MULTIPLE TOOTH EXTRACTIONS      NO PAST SURGERIES     TEE WITHOUT CARDIOVERSION N/A 10/30/2021   Procedure: TRANSESOPHAGEAL ECHOCARDIOGRAM (TEE);  Surgeon: Josue Hector, MD;  Location: [facility] ENDOSCOPY;  Service: Cardiovascular;  Laterality: N/A;    OB History     Gravida  0   Para  0   Term  0   Preterm  0   AB  0   Living  0      SAB  0   IAB  0   Ectopic  0   Multiple  0   Live Births  0            Home Medications    Prior to Admission medications   Medication Sig Start Date End Date Taking? Authorizing Provider  acetaminophen (TYLENOL) 325 MG tablet Take 2 tablets (650 mg total) by mouth every 4 (four) hours as needed for mild pain or headache (or temp > 37.5 C (99.5 F)). 11/03/21   Nolberto Hanlon, MD  apixaban (ELIQUIS) 5 MG TABS tablet Take 1 tablet (5 mg total) by mouth 2 (two) times daily. 11/03/21   Nolberto Hanlon, MD  atorvastatin (LIPITOR) 40 MG tablet Take 1 tablet (40 mg total) by mouth daily. 11/03/21   Nolberto Hanlon, MD  calcium-vitamin D (OSCAL 500/200 D-3) 500-200 MG-UNIT tablet Take 1 tablet by  mouth 2 (two) times daily. 10/05/19   Donnamae Jude, MD  Cholecalciferol (VITAMIN D3) 1.25 MG (50000 UT) CAPS Take 50,000 Units by mouth once a week. 07/20/21   [provider]  ferrous sulfate 325 (65 FE) MG tablet Take 325 mg by mouth daily with breakfast.    [provider]  megestrol (MEGACE) 40 MG tablet TAKE 1 TABLET BY MOUTH TWICE A DAY 09/06/21   Donnamae Jude, MD  metoprolol succinate (TOPROL-XL) 25 MG 24 hr tablet Take 0.5 tablets (12.5 mg total) by mouth daily. 11/03/21   Nolberto Hanlon, MD  senna-docusate (SENOKOT-S) 8.6-50 MG tablet Take 1 tablet by mouth at bedtime. 11/03/21   Nolberto Hanlon, MD    Family History Family History  Problem Relation Age of Onset   Chronic Renal Failure Father    Miscarriages / Stillbirths Father    Heart attack Mother    Stroke Mother    Cancer Mother    Diabetes Mother     Social History Social History   Tobacco Use   Smoking  status: Former    Types: Cigarettes    Quit date: 07/29/1999    Years since quitting: 22.4   Smokeless tobacco: Never  Vaping Use   Vaping Use: Never used  Substance Use Topics   Alcohol use: Yes   Drug use: Not Currently    Types: Marijuana    Comment: Quit in July 2019     Allergies   Latex and Other   Review of Systems Review of Systems  Constitutional:  Positive for fatigue. Negative for fever.  HENT: Negative.    Respiratory: Negative.    Cardiovascular: Negative.   Gastrointestinal: Negative.   Genitourinary:  Positive for vaginal bleeding.  Neurological:  Positive for dizziness.    Physical Exam Triage Vital Signs ED Triage Vitals [12/21/21 1109]  Enc Vitals Group     BP (!) 138/91     Pulse Rate 89     Resp 16     Temp 98.4 F (36.9 C)     Temp Source Oral     SpO2 100 %     Weight 155 lb (70.3 kg)     Height 5\' 3"  (1.6 m)     Head Circumference      Peak Flow      Pain Score 0     Pain Loc      Pain Edu?      Excl. in [place]?    No data found.  Updated Vital Signs BP (!) 138/91 (BP Location: Right Arm)    Pulse 89    Temp 98.4 F (36.9 C) (Oral)    Resp 16    Ht 5\' 3"  (1.6 m)    Wt 70.3 kg    SpO2 100%    BMI 27.46 kg/m   Visual Acuity Right Eye Distance:   Left Eye Distance:   Bilateral Distance:    Right Eye Near:   Left Eye Near:    Bilateral Near:     Physical Exam Constitutional:      Appearance: Normal appearance.  HENT:     Head: Normocephalic and atraumatic.  Cardiovascular:     Rate and Rhythm: Normal rate and regular rhythm.  Pulmonary:     Effort: Pulmonary effort is normal.     Breath sounds: Normal breath sounds.  Abdominal:     Palpations: Abdomen is soft.     Tenderness: There is abdominal tenderness.  Musculoskeletal:  Cervical back: Normal range of motion.  Neurological:     General: No focal deficit present.     Mental Status: She is alert and oriented to person, place, and time. Mental status is at baseline.      Motor: No weakness.     UC Treatments / Results  Labs (all labs ordered are listed, but only abnormal results are displayed) Labs Reviewed  CBC WITH DIFFERENTIAL/PLATELET - Abnormal; Notable for the following components:      Result Value   RBC 2.90 (*)    Hemoglobin 8.0 (*)    HCT 26.8 (*)    MCHC 29.9 (*)    All other components within normal limits  COMPREHENSIVE METABOLIC PANEL    EKG   Radiology No results found.  Procedures Procedures (including critical care time)  Medications Ordered in UC Medications - No data to display  Initial Impression / Assessment and Plan / UC Course  I have reviewed the triage vital signs and the nursing notes.  Pertinent labs & imaging results that were available during my care of the patient were reviewed by me and considered in my medical decision making (see chart for details).   Patient was seen today for heavy vaginal bleeding since being on eliquis since January.  She was on provera, but was told to stop this after her stroke.  She is fatigued and dizzy at times, but her h/h is not too low today.  I have reached out to her ob/gyn for further instruction on the provera, but have not heard back.  The patient and I feel she is safe to go home, but will follow up with her ob/gyn for further discussion and care about her medication and heavy bleeding.  If her bleeding worsens, or she feels worse, she should go to the ER for evaluation.  Final Clinical Impressions(s) / UC Diagnoses   Final diagnoses:  Vaginal bleeding  DUB (dysfunctional uterine bleeding)     Discharge Instructions      You were seen today for heavy vaginal bleeding.  Your vitals are stable.  Your blood work does show slight anemia, but is not too low for concern at this time.  I have reached out to your ob/gyn for their input, but have not heard back.  Either myself or they will reach out to you directly for further instruction.  If you do not hear from anyone  today, then please call your ob/gyn directly.     ED Prescriptions   None    PDMP not reviewed this encounter.   Rondel Oh, MD 12/21/21 1225

## 2021-12-22 LAB — CUP PACEART REMOTE DEVICE CHECK
Date Time Interrogation Session: 20230227192530
Implantable Pulse Generator Implant Date: 20230106

## 2021-12-25 NOTE — Progress Notes (Signed)
Carelink Summary Report / Loop Recorder 

## 2022-01-12 ENCOUNTER — Inpatient Hospital Stay: Payer: Self-pay | Admitting: Neurology

## 2022-01-25 ENCOUNTER — Ambulatory Visit (INDEPENDENT_AMBULATORY_CARE_PROVIDER_SITE_OTHER): Payer: BLUE CROSS/BLUE SHIELD

## 2022-01-25 DIAGNOSIS — Z4509 Encounter for adjustment and management of other cardiac device: Secondary | ICD-10-CM | POA: Diagnosis not present

## 2022-01-25 LAB — CUP PACEART REMOTE DEVICE CHECK
Date Time Interrogation Session: 20230401193009
Implantable Pulse Generator Implant Date: 20230106

## 2022-02-05 NOTE — Progress Notes (Signed)
Carelink Summary Report / Loop Recorder 

## 2022-02-17 ENCOUNTER — Telehealth: Payer: Self-pay | Admitting: Hematology

## 2022-02-17 NOTE — Telephone Encounter (Signed)
Called pt to sch appt per 4/25 staff msg from [employer]. No answer. Left msg for pt to call back to sch appt.  ?

## 2022-02-22 ENCOUNTER — Telehealth: Payer: Self-pay | Admitting: Hematology

## 2022-02-22 NOTE — Telephone Encounter (Signed)
Called pt to sch appt per 4/25 staff msg from [employer]. No answer. Left msg for pt to call back to sch appt.  ?

## 2022-02-26 LAB — CUP PACEART REMOTE DEVICE CHECK
Date Time Interrogation Session: 20230504193045
Implantable Pulse Generator Implant Date: 20230106

## 2022-03-01 ENCOUNTER — Ambulatory Visit (INDEPENDENT_AMBULATORY_CARE_PROVIDER_SITE_OTHER): Payer: BLUE CROSS/BLUE SHIELD

## 2022-03-01 DIAGNOSIS — I639 Cerebral infarction, unspecified: Secondary | ICD-10-CM

## 2022-03-18 NOTE — Progress Notes (Signed)
Carelink Summary Report / Loop Recorder 

## 2022-04-05 ENCOUNTER — Ambulatory Visit (INDEPENDENT_AMBULATORY_CARE_PROVIDER_SITE_OTHER): Payer: BLUE CROSS/BLUE SHIELD

## 2022-04-05 DIAGNOSIS — I639 Cerebral infarction, unspecified: Secondary | ICD-10-CM

## 2022-04-06 LAB — CUP PACEART REMOTE DEVICE CHECK
Date Time Interrogation Session: 20230606192529
Implantable Pulse Generator Implant Date: 20230106

## 2022-04-26 NOTE — Progress Notes (Signed)
Carelink Summary Report / Loop Recorder 

## 2022-05-06 DIAGNOSIS — Z975 Presence of (intrauterine) contraceptive device: Secondary | ICD-10-CM | POA: Insufficient documentation

## 2022-05-06 LAB — CUP PACEART REMOTE DEVICE CHECK
Date Time Interrogation Session: 20230709192735
Implantable Pulse Generator Implant Date: 20230106

## 2022-05-10 ENCOUNTER — Ambulatory Visit (INDEPENDENT_AMBULATORY_CARE_PROVIDER_SITE_OTHER): Payer: BLUE CROSS/BLUE SHIELD

## 2022-05-10 DIAGNOSIS — I639 Cerebral infarction, unspecified: Secondary | ICD-10-CM

## 2022-06-10 LAB — CUP PACEART REMOTE DEVICE CHECK
Date Time Interrogation Session: 20230811192643
Implantable Pulse Generator Implant Date: 20230106

## 2022-06-11 NOTE — Progress Notes (Signed)
Carelink Summary Report / Loop Recorder 

## 2022-06-14 ENCOUNTER — Ambulatory Visit (INDEPENDENT_AMBULATORY_CARE_PROVIDER_SITE_OTHER): Payer: BLUE CROSS/BLUE SHIELD

## 2022-06-14 DIAGNOSIS — I639 Cerebral infarction, unspecified: Secondary | ICD-10-CM | POA: Diagnosis not present

## 2022-07-11 NOTE — Progress Notes (Signed)
Carelink Summary Report / Loop Recorder 

## 2022-07-19 ENCOUNTER — Ambulatory Visit (INDEPENDENT_AMBULATORY_CARE_PROVIDER_SITE_OTHER): Payer: BLUE CROSS/BLUE SHIELD

## 2022-07-19 DIAGNOSIS — I639 Cerebral infarction, unspecified: Secondary | ICD-10-CM | POA: Diagnosis not present

## 2022-07-19 LAB — CUP PACEART REMOTE DEVICE CHECK
Date Time Interrogation Session: 20230924230353
Implantable Pulse Generator Implant Date: 20230106

## 2022-07-28 NOTE — Progress Notes (Signed)
Carelink Summary Report / Loop Recorder 

## 2022-08-23 ENCOUNTER — Ambulatory Visit (INDEPENDENT_AMBULATORY_CARE_PROVIDER_SITE_OTHER): Payer: BLUE CROSS/BLUE SHIELD

## 2022-08-23 DIAGNOSIS — I639 Cerebral infarction, unspecified: Secondary | ICD-10-CM

## 2022-08-23 LAB — CUP PACEART REMOTE DEVICE CHECK
Date Time Interrogation Session: 20231027230052
Implantable Pulse Generator Implant Date: 20230106

## 2022-09-22 NOTE — Progress Notes (Signed)
Carelink Summary Report / Loop Recorder 

## 2022-09-27 ENCOUNTER — Ambulatory Visit (INDEPENDENT_AMBULATORY_CARE_PROVIDER_SITE_OTHER): Payer: BLUE CROSS/BLUE SHIELD

## 2022-09-27 DIAGNOSIS — I639 Cerebral infarction, unspecified: Secondary | ICD-10-CM | POA: Diagnosis not present

## 2022-09-27 LAB — CUP PACEART REMOTE DEVICE CHECK
Date Time Interrogation Session: 20231203230839
Implantable Pulse Generator Implant Date: 20230106

## 2022-11-01 ENCOUNTER — Ambulatory Visit (INDEPENDENT_AMBULATORY_CARE_PROVIDER_SITE_OTHER): Payer: BLUE CROSS/BLUE SHIELD

## 2022-11-01 DIAGNOSIS — I639 Cerebral infarction, unspecified: Secondary | ICD-10-CM

## 2022-11-02 LAB — CUP PACEART REMOTE DEVICE CHECK
Date Time Interrogation Session: 20240107230813
Implantable Pulse Generator Implant Date: 20230106

## 2022-11-04 NOTE — Progress Notes (Signed)
Carelink Summary Report / Loop Recorder 

## 2022-11-30 DIAGNOSIS — M5416 Radiculopathy, lumbar region: Secondary | ICD-10-CM | POA: Diagnosis not present

## 2022-12-02 DIAGNOSIS — M6281 Muscle weakness (generalized): Secondary | ICD-10-CM | POA: Diagnosis not present

## 2022-12-02 DIAGNOSIS — Z7409 Other reduced mobility: Secondary | ICD-10-CM | POA: Diagnosis not present

## 2022-12-02 DIAGNOSIS — R29898 Other symptoms and signs involving the musculoskeletal system: Secondary | ICD-10-CM | POA: Diagnosis not present

## 2022-12-02 DIAGNOSIS — M25651 Stiffness of right hip, not elsewhere classified: Secondary | ICD-10-CM | POA: Diagnosis not present

## 2022-12-02 DIAGNOSIS — M5416 Radiculopathy, lumbar region: Secondary | ICD-10-CM | POA: Diagnosis not present

## 2022-12-02 DIAGNOSIS — Z789 Other specified health status: Secondary | ICD-10-CM | POA: Diagnosis not present

## 2022-12-02 DIAGNOSIS — M5386 Other specified dorsopathies, lumbar region: Secondary | ICD-10-CM | POA: Diagnosis not present

## 2022-12-05 LAB — CUP PACEART REMOTE DEVICE CHECK
Date Time Interrogation Session: 20240209230541
Implantable Pulse Generator Implant Date: 20230106

## 2022-12-06 ENCOUNTER — Ambulatory Visit: Payer: BLUE CROSS/BLUE SHIELD

## 2022-12-06 DIAGNOSIS — I639 Cerebral infarction, unspecified: Secondary | ICD-10-CM

## 2022-12-06 NOTE — Progress Notes (Signed)
Carelink Summary Report / Loop Recorder 

## 2022-12-07 DIAGNOSIS — M6281 Muscle weakness (generalized): Secondary | ICD-10-CM | POA: Diagnosis not present

## 2022-12-07 DIAGNOSIS — R29898 Other symptoms and signs involving the musculoskeletal system: Secondary | ICD-10-CM | POA: Diagnosis not present

## 2022-12-07 DIAGNOSIS — M5416 Radiculopathy, lumbar region: Secondary | ICD-10-CM | POA: Diagnosis not present

## 2022-12-07 DIAGNOSIS — Z789 Other specified health status: Secondary | ICD-10-CM | POA: Diagnosis not present

## 2022-12-07 DIAGNOSIS — Z7409 Other reduced mobility: Secondary | ICD-10-CM | POA: Diagnosis not present

## 2022-12-07 DIAGNOSIS — M5386 Other specified dorsopathies, lumbar region: Secondary | ICD-10-CM | POA: Diagnosis not present

## 2022-12-07 DIAGNOSIS — M25651 Stiffness of right hip, not elsewhere classified: Secondary | ICD-10-CM | POA: Diagnosis not present

## 2022-12-17 DIAGNOSIS — Z87891 Personal history of nicotine dependence: Secondary | ICD-10-CM | POA: Diagnosis not present

## 2022-12-17 DIAGNOSIS — Z8673 Personal history of transient ischemic attack (TIA), and cerebral infarction without residual deficits: Secondary | ICD-10-CM | POA: Diagnosis not present

## 2022-12-17 DIAGNOSIS — Z823 Family history of stroke: Secondary | ICD-10-CM | POA: Diagnosis not present

## 2022-12-17 DIAGNOSIS — Z978 Presence of other specified devices: Secondary | ICD-10-CM | POA: Diagnosis not present

## 2022-12-17 DIAGNOSIS — I69328 Other speech and language deficits following cerebral infarction: Secondary | ICD-10-CM | POA: Diagnosis not present

## 2022-12-17 DIAGNOSIS — Q2112 Patent foramen ovale: Secondary | ICD-10-CM | POA: Insufficient documentation

## 2023-01-10 ENCOUNTER — Ambulatory Visit (INDEPENDENT_AMBULATORY_CARE_PROVIDER_SITE_OTHER): Payer: Medicaid Other

## 2023-01-10 DIAGNOSIS — I639 Cerebral infarction, unspecified: Secondary | ICD-10-CM | POA: Diagnosis not present

## 2023-01-11 LAB — CUP PACEART REMOTE DEVICE CHECK
Date Time Interrogation Session: 20240317231353
Implantable Pulse Generator Implant Date: 20230106

## 2023-01-18 NOTE — Progress Notes (Signed)
Carelink Summary Report / Loop Recorder 

## 2023-02-01 DIAGNOSIS — I639 Cerebral infarction, unspecified: Secondary | ICD-10-CM | POA: Insufficient documentation

## 2023-02-14 ENCOUNTER — Ambulatory Visit (INDEPENDENT_AMBULATORY_CARE_PROVIDER_SITE_OTHER): Payer: Medicaid Other

## 2023-02-14 DIAGNOSIS — I639 Cerebral infarction, unspecified: Secondary | ICD-10-CM

## 2023-02-14 LAB — CUP PACEART REMOTE DEVICE CHECK
Date Time Interrogation Session: 20240419230522
Implantable Pulse Generator Implant Date: 20230106

## 2023-02-15 DIAGNOSIS — M7918 Myalgia, other site: Secondary | ICD-10-CM | POA: Insufficient documentation

## 2023-02-18 NOTE — Progress Notes (Signed)
Carelink Summary Report / Loop Recorder 

## 2023-03-11 ENCOUNTER — Telehealth: Payer: Self-pay | Admitting: Cardiology

## 2023-03-11 NOTE — Telephone Encounter (Signed)
Patient is scheduled to have an MRI tomorrow and she states she was advised to contact Dr. Lovena Neighbours office to inform him. She states she was informed that the MRI will clear the data from patient's device.

## 2023-03-11 NOTE — Telephone Encounter (Signed)
Followed up w/ pt regarding loop MRI compatibility. Pt verbalized understanding MRI will not affect loop.

## 2023-03-17 LAB — CUP PACEART REMOTE DEVICE CHECK
Date Time Interrogation Session: 20240522230121
Implantable Pulse Generator Implant Date: 20230106

## 2023-03-18 ENCOUNTER — Ambulatory Visit (HOSPITAL_BASED_OUTPATIENT_CLINIC_OR_DEPARTMENT_OTHER)
Admission: RE | Admit: 2023-03-18 | Discharge: 2023-03-18 | Disposition: A | Discharge status: Home / Self Care | Payer: Medicaid Other | Source: Ambulatory Visit | Attending: Cardiology | Admitting: Cardiology

## 2023-03-18 DIAGNOSIS — Z1231 Encounter for screening mammogram for malignant neoplasm of breast: Secondary | ICD-10-CM | POA: Insufficient documentation

## 2023-03-22 NOTE — Progress Notes (Signed)
Carelink Summary Report / Loop Recorder 

## 2023-03-23 ENCOUNTER — Ambulatory Visit (INDEPENDENT_AMBULATORY_CARE_PROVIDER_SITE_OTHER): Payer: Medicaid Other

## 2023-03-23 DIAGNOSIS — I639 Cerebral infarction, unspecified: Secondary | ICD-10-CM | POA: Diagnosis not present

## 2023-03-24 ENCOUNTER — Other Ambulatory Visit: Payer: Self-pay | Admitting: Cardiology

## 2023-03-24 DIAGNOSIS — R928 Other abnormal and inconclusive findings on diagnostic imaging of breast: Secondary | ICD-10-CM

## 2023-04-13 NOTE — Progress Notes (Signed)
Carelink Summary Report / Loop Recorder 

## 2023-04-15 DIAGNOSIS — I63512 Cerebral infarction due to unspecified occlusion or stenosis of left middle cerebral artery: Secondary | ICD-10-CM | POA: Insufficient documentation

## 2023-04-18 ENCOUNTER — Other Ambulatory Visit: Payer: Medicaid Other

## 2023-04-19 LAB — CUP PACEART REMOTE DEVICE CHECK
Date Time Interrogation Session: 20240624230420
Implantable Pulse Generator Implant Date: 20230106

## 2023-04-20 ENCOUNTER — Ambulatory Visit
Admission: RE | Admit: 2023-04-20 | Discharge: 2023-04-20 | Disposition: A | Discharge status: Home / Self Care | Payer: Medicaid Other | Source: Ambulatory Visit | Attending: Cardiology | Admitting: Cardiology

## 2023-04-20 DIAGNOSIS — R928 Other abnormal and inconclusive findings on diagnostic imaging of breast: Secondary | ICD-10-CM

## 2023-04-25 ENCOUNTER — Ambulatory Visit (INDEPENDENT_AMBULATORY_CARE_PROVIDER_SITE_OTHER): Payer: Medicaid Other

## 2023-04-25 DIAGNOSIS — I639 Cerebral infarction, unspecified: Secondary | ICD-10-CM

## 2023-05-09 ENCOUNTER — Other Ambulatory Visit: Payer: Self-pay | Admitting: Cardiology

## 2023-05-09 DIAGNOSIS — N6489 Other specified disorders of breast: Secondary | ICD-10-CM

## 2023-05-11 NOTE — Progress Notes (Signed)
 Carelink Summary Report / Loop Recorder 

## 2023-05-30 ENCOUNTER — Ambulatory Visit (INDEPENDENT_AMBULATORY_CARE_PROVIDER_SITE_OTHER): Payer: Medicaid Other

## 2023-05-30 DIAGNOSIS — I639 Cerebral infarction, unspecified: Secondary | ICD-10-CM | POA: Diagnosis not present

## 2023-06-10 NOTE — Progress Notes (Signed)
Carelink Summary Report / Loop Recorder 

## 2023-07-04 ENCOUNTER — Ambulatory Visit (INDEPENDENT_AMBULATORY_CARE_PROVIDER_SITE_OTHER): Payer: Medicaid Other

## 2023-07-04 DIAGNOSIS — I639 Cerebral infarction, unspecified: Secondary | ICD-10-CM

## 2023-07-04 LAB — CUP PACEART REMOTE DEVICE CHECK
Date Time Interrogation Session: 20240906230302
Implantable Pulse Generator Implant Date: 20230106

## 2023-07-18 DIAGNOSIS — Q2730 Arteriovenous malformation, site unspecified: Secondary | ICD-10-CM | POA: Insufficient documentation

## 2023-07-18 NOTE — Progress Notes (Signed)
Carelink Summary Report / Loop Recorder 

## 2023-07-25 ENCOUNTER — Encounter (HOSPITAL_BASED_OUTPATIENT_CLINIC_OR_DEPARTMENT_OTHER): Payer: Self-pay | Admitting: Physical Therapy

## 2023-07-25 ENCOUNTER — Ambulatory Visit (HOSPITAL_BASED_OUTPATIENT_CLINIC_OR_DEPARTMENT_OTHER): Payer: Medicaid Other | Attending: Pain Medicine | Admitting: Physical Therapy

## 2023-07-25 ENCOUNTER — Other Ambulatory Visit: Payer: Self-pay

## 2023-07-25 DIAGNOSIS — R2681 Unsteadiness on feet: Secondary | ICD-10-CM | POA: Insufficient documentation

## 2023-07-25 DIAGNOSIS — M533 Sacrococcygeal disorders, not elsewhere classified: Secondary | ICD-10-CM | POA: Diagnosis present

## 2023-07-25 DIAGNOSIS — M25551 Pain in right hip: Secondary | ICD-10-CM | POA: Diagnosis not present

## 2023-07-25 DIAGNOSIS — M6281 Muscle weakness (generalized): Secondary | ICD-10-CM | POA: Insufficient documentation

## 2023-07-25 NOTE — Therapy (Signed)
OUTPATIENT PHYSICAL THERAPY THORACOLUMBAR EVALUATION   Patient Name: Megan Chaney MRN: [id number] DOB:1971-01-04, 52 y.o., female Today's Date: 07/25/2023  END OF SESSION:  PT End of Session - 07/25/23 1001     Visit Number 1    Number of Visits 10    Date for PT Re-Evaluation 09/19/23    Authorization Type [employer] [email or web address]    PT Start Time 0815    PT Stop Time 0900    PT Time Calculation (min) 45 min    Activity Tolerance Patient tolerated treatment well    Behavior During Therapy [facility] for tasks assessed/performed             Past Medical History:  Diagnosis Date   Anemia    Fibroid    GERD (gastroesophageal reflux disease)    Past Surgical History:  Procedure Laterality Date   BUBBLE STUDY  10/30/2021   Procedure: BUBBLE STUDY;  Surgeon: Wendall Stade, MD;  Location: [facility] ENDOSCOPY;  Service: Cardiovascular;;   LOOP RECORDER INSERTION N/A 10/30/2021   Procedure: LOOP RECORDER INSERTION;  Surgeon: Lanier Prude, MD;  Location: [facility] INVASIVE CV LAB;  Service: Cardiovascular;  Laterality: N/A;   MULTIPLE TOOTH EXTRACTIONS     NO PAST SURGERIES     TEE WITHOUT CARDIOVERSION N/A 10/30/2021   Procedure: TRANSESOPHAGEAL ECHOCARDIOGRAM (TEE);  Surgeon: Wendall Stade, MD;  Location: [facility] ENDOSCOPY;  Service: Cardiovascular;  Laterality: N/A;   Patient Active Problem List   Diagnosis Date Noted   Acute ischemic stroke (HCC) 10/26/2021   Infertility counseling 10/04/2019   Enlarged uterus 07/31/2018   Uterine leiomyoma 07/31/2018   DUB (dysfunctional uterine bleeding) 07/31/2018    PCP: Melida Quitter, MD   REFERRING PROVIDER: Maximiano Coss, MD   REFERRING DIAG: Sacrococcygeal disorders, not elsewhere classified   Rationale for Evaluation and Treatment: Rehabilitation  THERAPY DIAG:  Sacrococcygeal disorders, not elsewhere classified  Pain in right hip  Muscle weakness (generalized)  Unsteadiness on feet  ONSET DATE: Jan 2023  SUBJECTIVE:                                                                                                                                                                                            SUBJECTIVE STATEMENT: Jan 2023 fell x 2 hurt right side. Had a stroke went to hospital and they found other things wrong, had a loop procedure before I left.  Had a catheterization  inMay.  I have been getting more energy as the year goes on. Anemia interfered and was unable to do therapy for a few years.  Has had DN for back earlier this  year, it did help.  Has steroid shots in shoulder and hip. Sacroiliac injections ~1-2 weeks with pain relief from 10/10 now 7/10.   PERTINENT HISTORY:  10/27/21 Multifocal acute/subacute nonhemorrhagic infarcts involving the left MCA territory.   PAIN:  Are you having pain? Yes: NPRS scale: current 7/10, 10/10 Pain location: across lumbar and sacroiliac area shooting into right foot Pain description: shooting/radiating Aggravating factors: weather, undefined Relieving factors: lying down, reposition, meds, recliner,sometimes walk  PRECAUTIONS: Fall  RED FLAGS: Bowel or bladder incontinence: Yes: but improved    WEIGHT BEARING RESTRICTIONS: no  FALLS:  Has patient fallen in last 6 months? No  LIVING ENVIRONMENT: Lives with: lives with their family Lives in: House/apartment Stairs:  outdoor 3 step handrail Has following equipment at home: None  OCCUPATION: disabled  PLOF: Independent  PATIENT GOALS: decrease/ manage pain, do daily activities like walking 10 mins  NEXT MD VISIT: next week  OBJECTIVE:   DIAGNOSTIC FINDINGS:  CT cervical spine 1/23: IMPRESSION: No recent fracture is seen in the cervical spine. Alignment of posterior margins of vertebral bodies is unremarkable. Cervical spondylosis with mild encroachment of neural foramina at C4-C5 and C5-C6 levels. MRI 1/23: IMPRESSION: 1. Multifocal acute/subacute nonhemorrhagic infarcts involving the left MCA  territory. Findings are predominantly inferior and posteriorly. There is cortical infarction also involves the more anterior middle and inferior frontal lobes. 2. No acute hemorrhage.  PATIENT SURVEYS:  LEFS:31/80   COGNITION: Overall cognitive status: Within functional limits for tasks assessed     SENSATION: Paresthesia through RLE   POSTURE: forward head and guarded/tight positioning  PALPATION: TTP about ASIS, SI area through mid thoracic area.  Muscle tightness throughout paraspinals  LUMBAR ROM:   AROM eval  Flexion 20% limited due to pain and muscle tightness  Extension full  Right lateral flexion 25%  Left lateral flexion 50%  Right rotation full  Left rotation full   (Blank rows = not tested)  LOWER EXTREMITY ROM:     WFL  LOWER EXTREMITY MMT:    MMT Right eval Left eval  Hip flexion  Norm 25-26 20.3 26.9  Hip extension    Hip abduction  Norm: 45-47 14.3 18.5  Hip adduction    Hip internal rotation    Hip external rotation    Knee flexion    Knee extension  Norm 70-73 15.0 27.2  Ankle dorsiflexion    Ankle plantarflexion    Ankle inversion    Ankle eversion     (Blank rows = not tested)  LUMBAR SPECIAL TESTS:  Slump test: Negative  FUNCTIONAL TESTS:  5 times sit to stand: 31.75 Timed up and go (TUG): 23.81 4 stage test: leading lle: passed 1,2,3.  SLS x 6 s  GAIT: Distance walked: 400 ft Assistive device utilized: None Level of assistance: Complete Independence Comments: Slowed cadence, shorted step length off loading slightly towards left, just slight heel strike with toe off.  TODAY'S TREATMENT:  Eval Functional testing   PATIENT EDUCATION:  Education details: Discussed eval findings, rehab rationale, aquatic program progression/POC and pools in area. Patient is in agreement  Person educated:  Patient Education method: Explanation Education comprehension: verbalized understanding  HOME EXERCISE PROGRAM: TBA  ASSESSMENT:  CLINICAL IMPRESSION: Patient is a 52 y.o. f who was seen today for physical therapy evaluation and treatment for Sacrococcygeal disorder. She has been disable for the past 2 years due to CVA and cardiac issues.  She had a fall which preceded diagnosis injuring her LB. Residual weakness from cva right sided.  She presents with significant strength deficits bilaterally and with functional testing she is a high fall risk. She has muscle tightness throughout paraspinals which limit her thoracic and lumbar ROM.  She will benefit from skilled physical therpy to improve her functional mobility, Adl's and return her to an improved QOL. Plan on aquatic based initially then will transition to land based when deemed approp.  She reports reduction of pain in sacrum since recent injections.   OBJECTIVE IMPAIRMENTS: Abnormal gait, decreased activity tolerance, decreased balance, decreased endurance, decreased mobility, difficulty walking, decreased strength, postural dysfunction, and pain.   ACTIVITY LIMITATIONS: carrying, lifting, bending, sitting, standing, squatting, stairs, transfers, and locomotion level  PARTICIPATION LIMITATIONS: meal prep, cleaning, laundry, driving, shopping, community activity, occupation, and yard work  PERSONAL FACTORS: Age, Fitness, and 1-2 comorbidities: Past CVA, cardia dysfunction  are also affecting patient's functional outcome.   REHAB POTENTIAL: Good  CLINICAL DECISION MAKING: Evolving/moderate complexity  EVALUATION COMPLEXITY: Moderate   GOALS: Goals reviewed with patient? Yes  SHORT TERM GOALS: Target date: 08/25/23  Pt will tolerate full aquatic sessions consistently without increase in pain and with improving function to demonstrate good toleration and effectiveness of intervention.  Baseline: Goal status: INITIAL  2.  Pt will  tolerate walking to and from store 4 x week (10 minutes ea way) to demonstrated improved toleration to waking Baseline:  Goal status: INITIAL  3.  Pt will report decrease in LBP and sacrum to 5/10 or less Baseline: 7/10 Goal status: INITIAL    LONG TERM GOALS: Target date: 09/17/23  Pt will improve on LEFS by 9 points 40/80 to demonstrate improved function Baseline: 31/80 Goal status: INITIAL  2.  Pt will complete SLS x 20s to demonstrate improved balance Baseline:  Goal status: INITIAL  3.  Pt will demonstrate full lumbar flex without increase in pain Baseline:  Goal status: INITIAL  4.  Pt will improve LE strength by at least 10 lbp to improve all functional mobility and decrease fall risk Baseline:  Goal status: INITIAL  5.  Pt will improve on 5 X STS test to <or=20s to demonstrate improving functional lower extremity strength, transitional movements, and balance Baseline:31.7  Goal status: INITIAL  6.  Pt will be indep with final HEP's (land and aquatic as appropriate) for continued management of condition Baseline: none Goal status: INITIAL  PLAN:  PT FREQUENCY: 1-2x/week  PT DURATION: 8 weeks 10 visits.  Extended time to allow for scheduling.  PLANNED INTERVENTIONS: Therapeutic exercises, Therapeutic activity, Neuromuscular re-education, Balance training, Gait training, Patient/Family education, Self Care, Joint mobilization, Joint manipulation, Stair training, Orthotic/Fit training, DME instructions, Aquatic Therapy, Dry Needling, Cryotherapy, Moist heat, scar mobilization, Taping, Ionotophoresis 4mg /ml Dexamethasone, Manual therapy, and Re-evaluation.  PLAN FOR NEXT SESSION: Aquatics: core and LE stretching; general strengthening; aerobic capacity training   Geni Bers, PT 07/25/2023, 10:02 AM

## 2023-08-03 ENCOUNTER — Ambulatory Visit (HOSPITAL_BASED_OUTPATIENT_CLINIC_OR_DEPARTMENT_OTHER): Payer: Medicaid Other | Attending: Pain Medicine | Admitting: Physical Therapy

## 2023-08-03 ENCOUNTER — Encounter (HOSPITAL_BASED_OUTPATIENT_CLINIC_OR_DEPARTMENT_OTHER): Payer: Self-pay | Admitting: Physical Therapy

## 2023-08-03 DIAGNOSIS — M533 Sacrococcygeal disorders, not elsewhere classified: Secondary | ICD-10-CM

## 2023-08-03 DIAGNOSIS — M25551 Pain in right hip: Secondary | ICD-10-CM

## 2023-08-03 DIAGNOSIS — M6281 Muscle weakness (generalized): Secondary | ICD-10-CM | POA: Diagnosis present

## 2023-08-03 DIAGNOSIS — R2681 Unsteadiness on feet: Secondary | ICD-10-CM | POA: Diagnosis present

## 2023-08-03 NOTE — Therapy (Signed)
OUTPATIENT PHYSICAL THERAPY THORACOLUMBAR EVALUATION   Patient Name: Megan Chaney MRN: [id number] DOB:11-16-1970, 52 y.o., female Today's Date: 08/03/2023  END OF SESSION:  PT End of Session - 08/03/23 1415     Visit Number 2    Number of Visits 10    Date for PT Re-Evaluation 09/19/23    Authorization Type [employer] mdcaid    PT Start Time 1420    PT Stop Time 1459    PT Time Calculation (min) 39 min    Activity Tolerance Patient tolerated treatment well    Behavior During Therapy WFL for tasks assessed/performed             Past Medical History:  Diagnosis Date   Anemia    Fibroid    GERD (gastroesophageal reflux disease)    Past Surgical History:  Procedure Laterality Date   BUBBLE STUDY  10/30/2021   Procedure: BUBBLE STUDY;  Surgeon: Wendall Stade, MD;  Location: [facility] ENDOSCOPY;  Service: Cardiovascular;;   LOOP RECORDER INSERTION N/A 10/30/2021   Procedure: LOOP RECORDER INSERTION;  Surgeon: Lanier Prude, MD;  Location: [facility] INVASIVE CV LAB;  Service: Cardiovascular;  Laterality: N/A;   MULTIPLE TOOTH EXTRACTIONS     NO PAST SURGERIES     TEE WITHOUT CARDIOVERSION N/A 10/30/2021   Procedure: TRANSESOPHAGEAL ECHOCARDIOGRAM (TEE);  Surgeon: Wendall Stade, MD;  Location: [facility] ENDOSCOPY;  Service: Cardiovascular;  Laterality: N/A;   Patient Active Problem List   Diagnosis Date Noted   Acute ischemic stroke (HCC) 10/26/2021   Infertility counseling 10/04/2019   Enlarged uterus 07/31/2018   Uterine leiomyoma 07/31/2018   DUB (dysfunctional uterine bleeding) 07/31/2018    PCP: Melida Quitter, MD   REFERRING PROVIDER: Maximiano Coss, MD   REFERRING DIAG: Sacrococcygeal disorders, not elsewhere classified   Rationale for Evaluation and Treatment: Rehabilitation  THERAPY DIAG:  Sacrococcygeal disorders, not elsewhere classified  Pain in right hip  Muscle weakness (generalized)  Unsteadiness on feet  ONSET DATE: Jan 2023  SUBJECTIVE:                                                                                                                                                                                            SUBJECTIVE STATEMENT: I am better today than I was the last time I saw you I think the shot finally kicked in.   Initial subjective Jan 2023 fell x 2 hurt right side. Had a stroke went to hospital and they found other things wrong, had a loop procedure before I left.  Had a catheterization  inMay.  I have been getting more energy as  the year goes on. Anemia interfered and was unable to do therapy for a few years.  Has had DN for back earlier this year, it did help.  Has steroid shots in shoulder and hip. Sacroiliac injections ~1-2 weeks with pain relief from 10/10 now 7/10.   PERTINENT HISTORY:  10/27/21 Multifocal acute/subacute nonhemorrhagic infarcts involving the left MCA territory.   PAIN:  Are you having pain? Yes: NPRS scale: current 5/10, 10/10 Pain location: across lumbar and sacroiliac area shooting into right foot Pain description: shooting/radiating Aggravating factors: weather, undefined Relieving factors: lying down, reposition, meds, recliner,sometimes walk  PRECAUTIONS: Fall  RED FLAGS: Bowel or bladder incontinence: Yes: but improved    WEIGHT BEARING RESTRICTIONS: no  FALLS:  Has patient fallen in last 6 months? No  LIVING ENVIRONMENT: Lives with: lives with their family Lives in: House/apartment Stairs:  outdoor 3 step handrail Has following equipment at home: None  OCCUPATION: disabled  PLOF: Independent  PATIENT GOALS: decrease/ manage pain, do daily activities like walking 10 mins  NEXT MD VISIT: next week  OBJECTIVE:   DIAGNOSTIC FINDINGS:  CT cervical spine 1/23: IMPRESSION: No recent fracture is seen in the cervical spine. Alignment of posterior margins of vertebral bodies is unremarkable. Cervical spondylosis with mild encroachment of neural foramina at C4-C5  and C5-C6 levels. MRI 1/23: IMPRESSION: 1. Multifocal acute/subacute nonhemorrhagic infarcts involving the left MCA territory. Findings are predominantly inferior and posteriorly. There is cortical infarction also involves the more anterior middle and inferior frontal lobes. 2. No acute hemorrhage.  PATIENT SURVEYS:  LEFS:31/80   COGNITION: Overall cognitive status: Within functional limits for tasks assessed     SENSATION: Paresthesia through RLE   POSTURE: forward head and guarded/tight positioning  PALPATION: TTP about ASIS, SI area through mid thoracic area.  Muscle tightness throughout paraspinals  LUMBAR ROM:   AROM eval  Flexion 20% limited due to pain and muscle tightness  Extension full  Right lateral flexion 25%  Left lateral flexion 50%  Right rotation full  Left rotation full   (Blank rows = not tested)  LOWER EXTREMITY ROM:     WFL  LOWER EXTREMITY MMT:    MMT Right eval Left eval  Hip flexion  Norm 25-26 20.3 26.9  Hip extension    Hip abduction  Norm: 45-47 14.3 18.5  Hip adduction    Hip internal rotation    Hip external rotation    Knee flexion    Knee extension  Norm 70-73 15.0 27.2  Ankle dorsiflexion    Ankle plantarflexion    Ankle inversion    Ankle eversion     (Blank rows = not tested)  LUMBAR SPECIAL TESTS:  Slump test: Negative  FUNCTIONAL TESTS:  5 times sit to stand: 31.75 Timed up and go (TUG): 23.81 4 stage test: leading lle: passed 1,2,3.  SLS x 6 s  GAIT: Distance walked: 400 ft Assistive device utilized: None Level of assistance: Complete Independence Comments: Slowed cadence, shorted step length off loading slightly towards left, just slight heel strike with toe off.  TODAY'S TREATMENT:  Pt seen for aquatic therapy today.  Treatment took place in water 3.5-4.75 ft in depth at the  [facility] pool. Temp of water was 91.  Pt entered/exited the pool via stairs and step to pattern with hand rail.  *intro to setting *walking forward, back and side stepping x 4 widths *decompression with yellow noodle under arms: cycling; hip add/abd; skiing *seated in noodle: pelvic tilting and hip hiking *L stretch x 3 *abdominal and paraspinal stretching prone->KTC using noodle and hands on wall   Pt requires the buoyancy and hydrostatic pressure of water for support, and to offload joints by unweighting joint load by at least 50 % in navel deep water and by at least 75-80% in chest to neck deep water.  Viscosity of the water is needed for resistance of strengthening. Water current perturbations provides challenge to standing balance requiring increased core activation.     PATIENT EDUCATION:  Education details: Discussed eval findings, rehab rationale, aquatic program progression/POC and pools in area. Patient is in agreement  Person educated: Patient Education method: Explanation Education comprehension: verbalized understanding  HOME EXERCISE PROGRAM: TBA  ASSESSMENT:  CLINICAL IMPRESSION: Pt demonstrates safety and independence in pool with therapist instructing on deck.  She is able to find COB with noodle for decompression as well as cycle for short time for unloaded movement benefit.  She reports no pain with vertically suspended position.  Pelvic tilting and LB stretching cause some increase in sacral area pain. Trunk extension relieves. Good toleration to 1st session.  Pain level remains same coming and going. Goals ongoing   Initial impression Patient is a 52 y.o. f who was seen today for physical therapy evaluation and treatment for Sacrococcygeal disorder. She has been disable for the past 2 years due to CVA and cardiac issues.  She had a fall which preceded diagnosis injuring her LB. Residual weakness from cva right sided.  She presents with significant  strength deficits bilaterally and with functional testing she is a high fall risk. She has muscle tightness throughout paraspinals which limit her thoracic and lumbar ROM.  She will benefit from skilled physical therpy to improve her functional mobility, Adl's and return her to an improved QOL. Plan on aquatic based initially then will transition to land based when deemed approp.  She reports reduction of pain in sacrum since recent injections.   OBJECTIVE IMPAIRMENTS: Abnormal gait, decreased activity tolerance, decreased balance, decreased endurance, decreased mobility, difficulty walking, decreased strength, postural dysfunction, and pain.   ACTIVITY LIMITATIONS: carrying, lifting, bending, sitting, standing, squatting, stairs, transfers, and locomotion level  PARTICIPATION LIMITATIONS: meal prep, cleaning, laundry, driving, shopping, community activity, occupation, and yard work  PERSONAL FACTORS: Age, Fitness, and 1-2 comorbidities: Past CVA, cardia dysfunction  are also affecting patient's functional outcome.   REHAB POTENTIAL: Good  CLINICAL DECISION MAKING: Evolving/moderate complexity  EVALUATION COMPLEXITY: Moderate   GOALS: Goals reviewed with patient? Yes  SHORT TERM GOALS: Target date: 08/25/23  Pt will tolerate full aquatic sessions consistently without increase in pain and with improving function to demonstrate good toleration and effectiveness of intervention.  Baseline: Goal status: INITIAL  2.  Pt will tolerate walking to and from store 4 x week (10 minutes ea way) to demonstrated improved toleration to waking Baseline:  Goal status: INITIAL  3.  Pt will report decrease in LBP and sacrum to 5/10 or less Baseline: 7/10 Goal status: INITIAL    LONG TERM GOALS: Target date: 09/17/23  Pt will improve on LEFS by  9 points 40/80 to demonstrate improved function Baseline: 31/80 Goal status: INITIAL  2.  Pt will complete SLS x 20s to demonstrate improved  balance Baseline:  Goal status: INITIAL  3.  Pt will demonstrate full lumbar flex without increase in pain Baseline:  Goal status: INITIAL  4.  Pt will improve LE strength by at least 10 lbp to improve all functional mobility and decrease fall risk Baseline:  Goal status: INITIAL  5.  Pt will improve on 5 X STS test to <or=20s to demonstrate improving functional lower extremity strength, transitional movements, and balance Baseline:31.7  Goal status: INITIAL  6.  Pt will be indep with final HEP's (land and aquatic as appropriate) for continued management of condition Baseline: none Goal status: INITIAL  PLAN:  PT FREQUENCY: 1-2x/week  PT DURATION: 8 weeks 10 visits.  Extended time to allow for scheduling.  PLANNED INTERVENTIONS: Therapeutic exercises, Therapeutic activity, Neuromuscular re-education, Balance training, Gait training, Patient/Family education, Self Care, Joint mobilization, Joint manipulation, Stair training, Orthotic/Fit training, DME instructions, Aquatic Therapy, Dry Needling, Cryotherapy, Moist heat, scar mobilization, Taping, Ionotophoresis 4mg /ml Dexamethasone, Manual therapy, and Re-evaluation.  PLAN FOR NEXT SESSION: Aquatics: core and LE stretching; general strengthening; aerobic capacity training   Geni Bers, PT 08/03/2023, 2:59 PM

## 2023-08-08 ENCOUNTER — Ambulatory Visit: Payer: Medicaid Other

## 2023-08-08 DIAGNOSIS — I639 Cerebral infarction, unspecified: Secondary | ICD-10-CM

## 2023-08-08 IMAGING — CT CT ANGIO CHEST
2 of 6 series · 19 of 36 positions shown · IV contrast (omnipaque)
Comparison: None.

CLINICAL DATA: Chest pain, shortness of breath

EXAM:
CT ANGIOGRAPHY CHEST WITH CONTRAST
TECHNIQUE: Multidetector CT imaging of the chest was performed using the
standard protocol during bolus administration of intravenous
contrast. Multiplanar CT image reconstructions and MIPs were
obtained to evaluate the vascular anatomy.
CONTRAST:  100mL OMNIPAQUE IOHEXOL 350 MG/ML SOLN

[Series 7: pe thins · axial · 0.63mm/px · z∈[-689,-470]mm · 18 of 349 slices shown]
[im 18/349  lung]
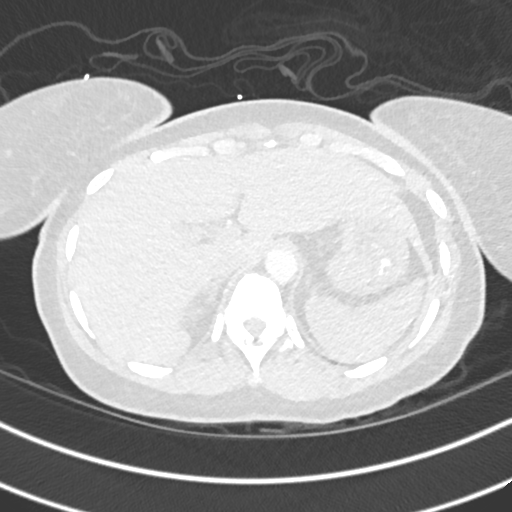
[im 35/349  mediastinal]
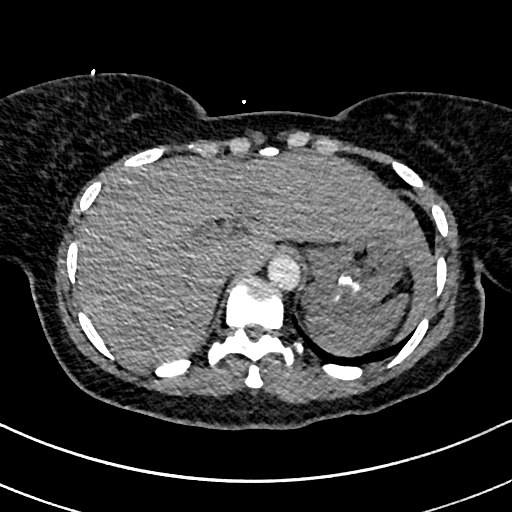
[im 53/349  lung]
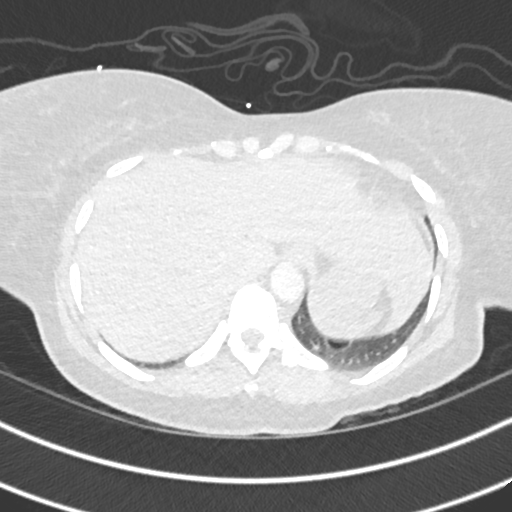
[im 70/349  mediastinal]
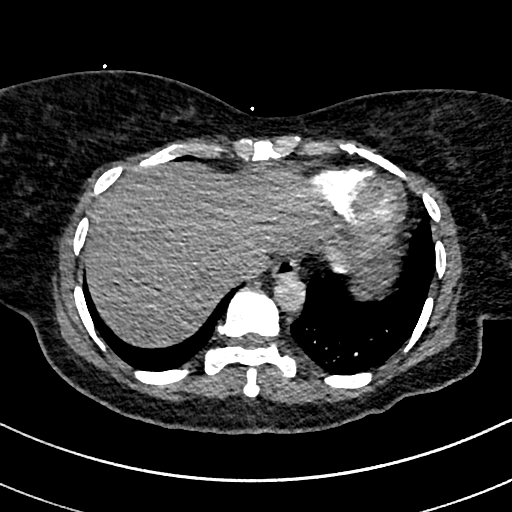
[im 88/349  lung]
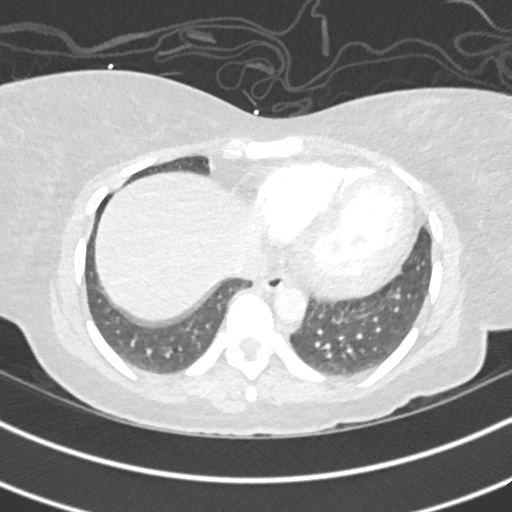
[im 105/349  mediastinal]
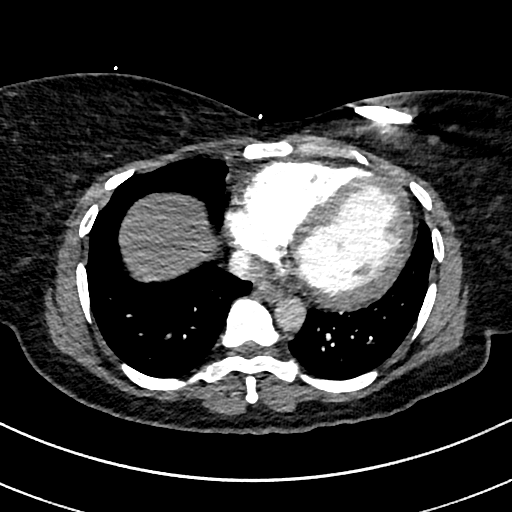
[im 122/349  lung]
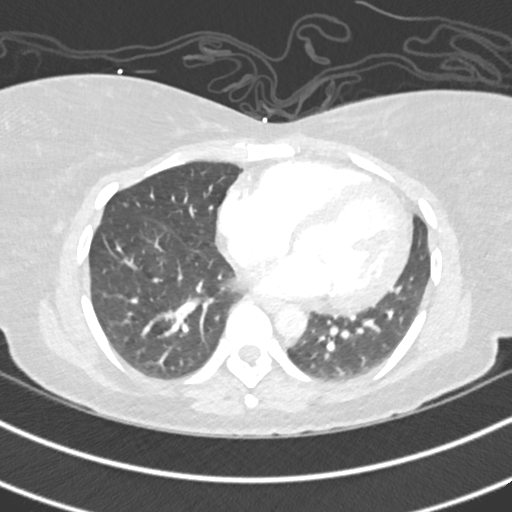
[im 140/349  mediastinal]
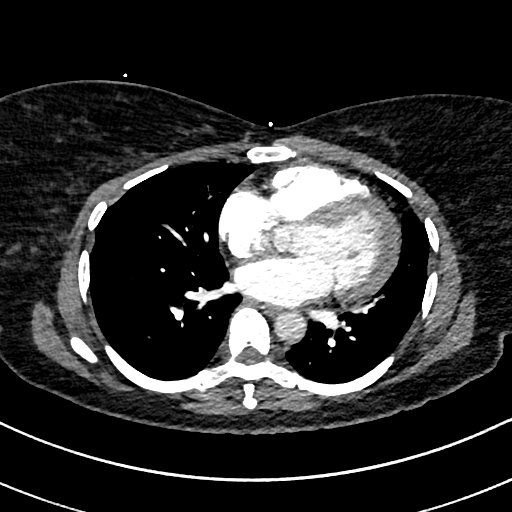
[im 157/349  lung]
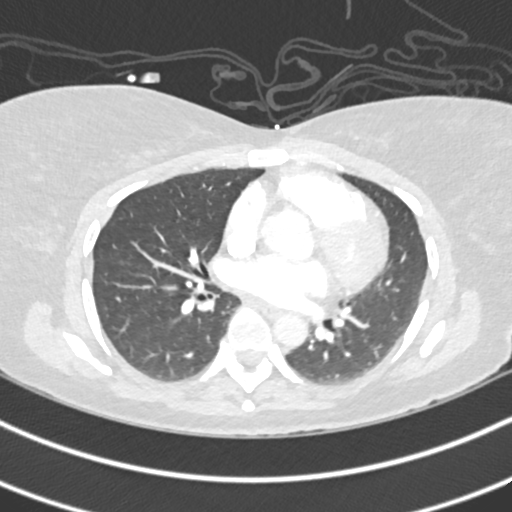
[im 192/349  mediastinal]
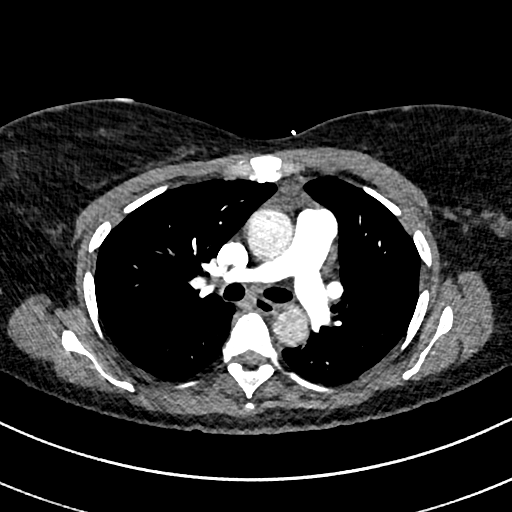
[im 209/349  lung]
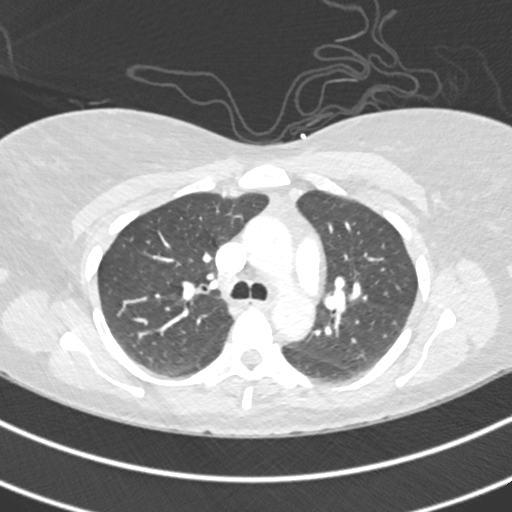
[im 227/349  mediastinal]
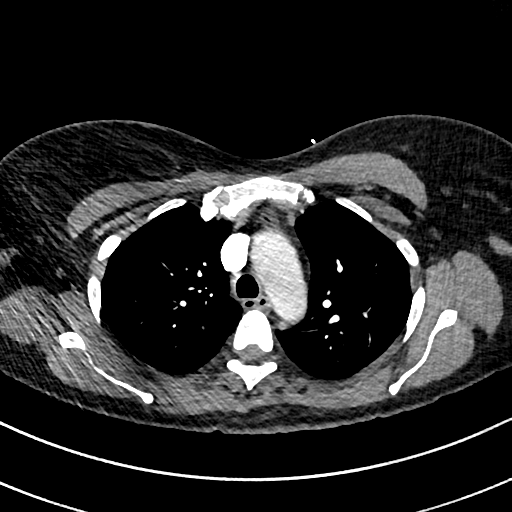
[im 244/349  lung]
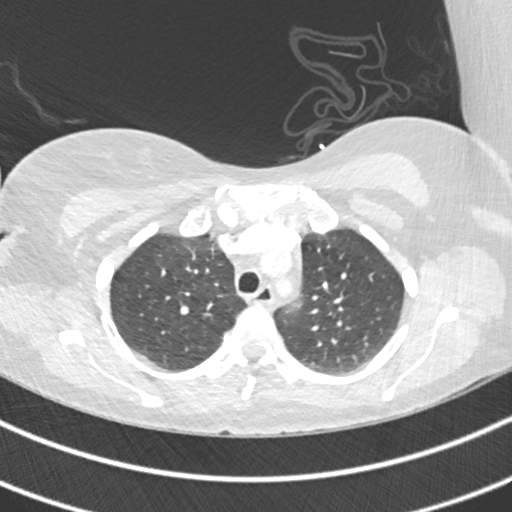
[im 262/349  mediastinal]
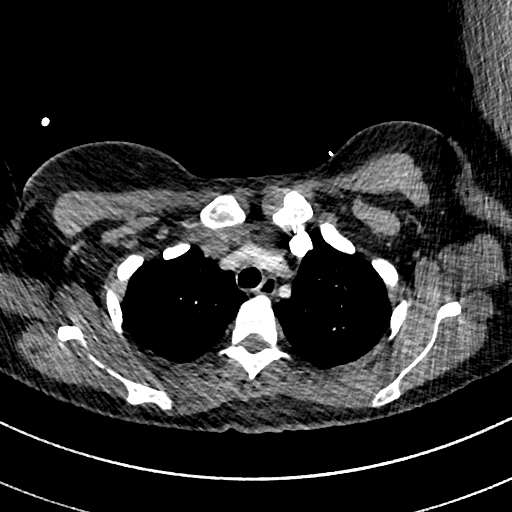
[im 279/349  lung]
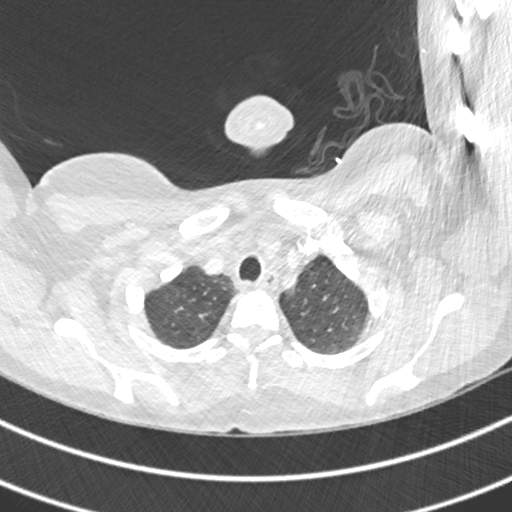
[im 296/349  mediastinal]
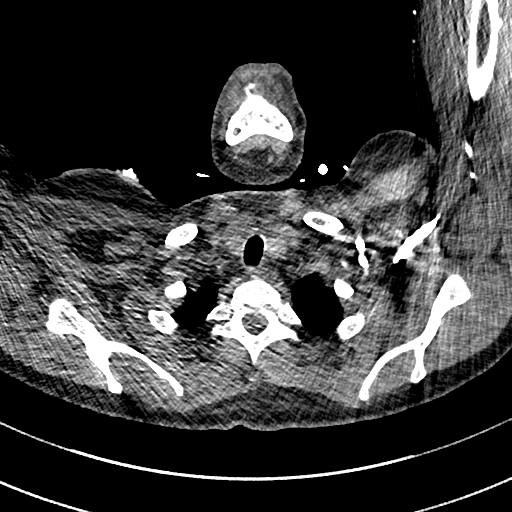
[im 314/349  lung]
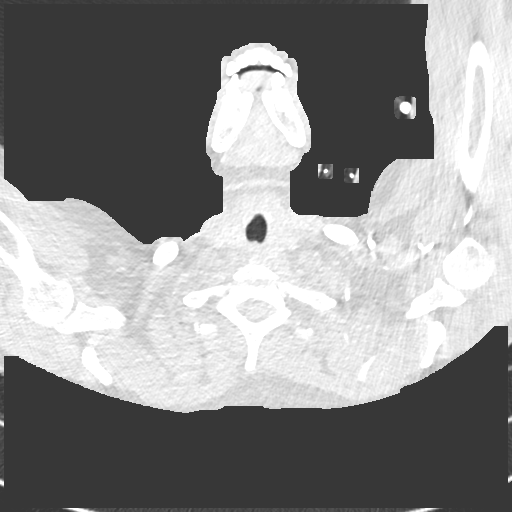
[im 331/349  mediastinal]
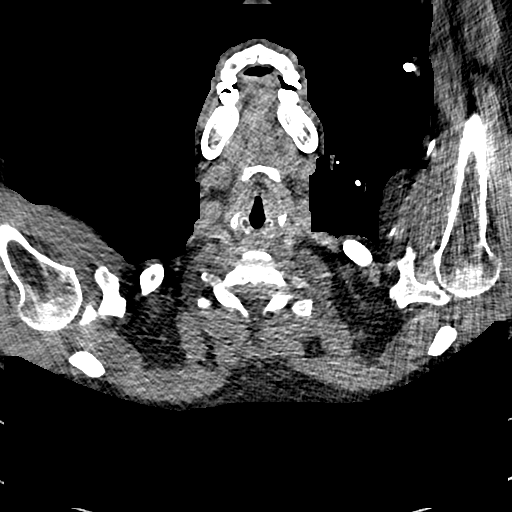

[Series 8: pe 2mm cor · coronal · 0.59mm/px · 1 of 99 slices shown]
[im 50/99  mediastinal]
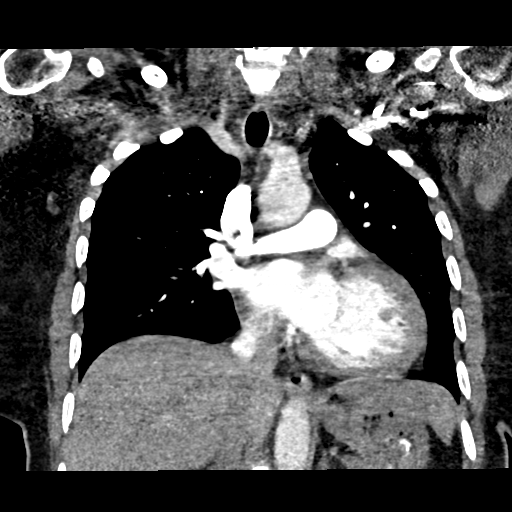

[19 of 36 positions shown; findings below may reference images not displayed]

FINDINGS: Cardiovascular: Satisfactory opacification of the pulmonary arteries
to the segmental level. No evidence of pulmonary embolism. Mild
cardiomegaly. No pericardial effusion.

Mediastinum/Nodes: No enlarged mediastinal, hilar, or axillary lymph
nodes. Thyroid gland, trachea, and esophagus demonstrate no
significant findings.

Lungs/Pleura: Lungs are clear. Small pneumatocele of the left lung
base (series 6, image 102). No pleural effusion or pneumothorax.

Upper Abdomen: No acute abnormality.

Musculoskeletal: No chest wall abnormality. No acute or significant
osseous findings.

Review of the MIP images confirms the above findings.
IMPRESSION: 1. Negative examination for pulmonary embolism.
2. Mild cardiomegaly.

## 2023-08-09 LAB — CUP PACEART REMOTE DEVICE CHECK
Date Time Interrogation Session: 20241013230500
Implantable Pulse Generator Implant Date: 20230106

## 2023-08-10 ENCOUNTER — Ambulatory Visit (HOSPITAL_BASED_OUTPATIENT_CLINIC_OR_DEPARTMENT_OTHER): Payer: Medicaid Other | Admitting: Physical Therapy

## 2023-08-10 DIAGNOSIS — R2681 Unsteadiness on feet: Secondary | ICD-10-CM

## 2023-08-10 DIAGNOSIS — M533 Sacrococcygeal disorders, not elsewhere classified: Secondary | ICD-10-CM | POA: Diagnosis not present

## 2023-08-10 DIAGNOSIS — M6281 Muscle weakness (generalized): Secondary | ICD-10-CM

## 2023-08-10 DIAGNOSIS — M25551 Pain in right hip: Secondary | ICD-10-CM

## 2023-08-10 NOTE — Therapy (Signed)
OUTPATIENT PHYSICAL THERAPY THORACOLUMBAR TREATMENT   Patient Name: Megan Chaney MRN: [id number] DOB:[birth year], 52 y.o., female Today's Date: 08/10/2023  END OF SESSION:  PT End of Session - 08/10/23 0815     Visit Number 3    Number of Visits 10    Date for PT Re-Evaluation 09/19/23    Authorization Type [employer] mdcaid    PT Start Time 0815    PT Stop Time 0855    PT Time Calculation (min) 40 min    Activity Tolerance Patient tolerated treatment well    Behavior During Therapy [facility] for tasks assessed/performed             Past Medical History:  Diagnosis Date   Anemia    Fibroid    GERD (gastroesophageal reflux disease)    Past Surgical History:  Procedure Laterality Date   BUBBLE STUDY  10/30/2021   Procedure: BUBBLE STUDY;  Surgeon: Wendall Stade, MD;  Location: [facility] ENDOSCOPY;  Service: Cardiovascular;;   LOOP RECORDER INSERTION N/A 10/30/2021   Procedure: LOOP RECORDER INSERTION;  Surgeon: Lanier Prude, MD;  Location: [facility] INVASIVE CV LAB;  Service: Cardiovascular;  Laterality: N/A;   MULTIPLE TOOTH EXTRACTIONS     NO PAST SURGERIES     TEE WITHOUT CARDIOVERSION N/A 10/30/2021   Procedure: TRANSESOPHAGEAL ECHOCARDIOGRAM (TEE);  Surgeon: Wendall Stade, MD;  Location: [facility] ENDOSCOPY;  Service: Cardiovascular;  Laterality: N/A;   Patient Active Problem List   Diagnosis Date Noted   Acute ischemic stroke (HCC) 10/26/2021   Infertility counseling 10/04/2019   Enlarged uterus 07/31/2018   Uterine leiomyoma 07/31/2018   DUB (dysfunctional uterine bleeding) 07/31/2018    PCP: Melida Quitter, MD   REFERRING PROVIDER: Maximiano Coss, MD   REFERRING DIAG: Sacrococcygeal disorders, not elsewhere classified   Rationale for Evaluation and Treatment: Rehabilitation  THERAPY DIAG:  Sacrococcygeal disorders, not elsewhere classified  Pain in right hip  Muscle weakness (generalized)  Unsteadiness on feet  ONSET DATE: Jan 2023  SUBJECTIVE:                                                                                                                                                                                            SUBJECTIVE STATEMENT: Pt reports that she felt tired after first aquatic session, but overall did well.  She states she was nervous when supported by noodle in deeper water since she doesn't know how to swim.   Initial evaluation:  Jan 2023 fell x 2 hurt right side. Had a stroke went to hospital and they found other things wrong, had a loop procedure before I  left.  Had a catheterization  inMay.  I have been getting more energy as the year goes on. Anemia interfered and was unable to do therapy for a few years.  Has had DN for back earlier this year, it did help.  Has steroid shots in shoulder and hip. Sacroiliac injections ~1-2 weeks with pain relief from 10/10 now 7/10.   PERTINENT HISTORY:  10/27/21 Multifocal acute/subacute nonhemorrhagic infarcts involving the left MCA territory.   PAIN:  Are you having pain? Yes: NPRS scale: 4-5/10 Pain location: across lumbar  Pain description: shooting/radiating Aggravating factors: weather, undefined Relieving factors: lying down, reposition, meds, recliner,sometimes walk  PRECAUTIONS: Fall  RED FLAGS: Bowel or bladder incontinence: Yes: but improved    WEIGHT BEARING RESTRICTIONS: no  FALLS:  Has patient fallen in last 6 months? No  LIVING ENVIRONMENT: Lives with: lives with their family Lives in: House/apartment Stairs:  outdoor 3 step handrail Has following equipment at home: None  OCCUPATION: disabled  PLOF: Independent  PATIENT GOALS: decrease/ manage pain, do daily activities like walking 10 mins  NEXT MD VISIT: next week  OBJECTIVE:   DIAGNOSTIC FINDINGS:  CT cervical spine 1/23: IMPRESSION: No recent fracture is seen in the cervical spine. Alignment of posterior margins of vertebral bodies is unremarkable. Cervical spondylosis with mild  encroachment of neural foramina at C4-C5 and C5-C6 levels. MRI 1/23: IMPRESSION: 1. Multifocal acute/subacute nonhemorrhagic infarcts involving the left MCA territory. Findings are predominantly inferior and posteriorly. There is cortical infarction also involves the more anterior middle and inferior frontal lobes. 2. No acute hemorrhage.  PATIENT SURVEYS:  LEFS:31/80   COGNITION: Overall cognitive status: Within functional limits for tasks assessed     SENSATION: Paresthesia through RLE   POSTURE: forward head and guarded/tight positioning  PALPATION: TTP about ASIS, SI area through mid thoracic area.  Muscle tightness throughout paraspinals  LUMBAR ROM:   AROM eval  Flexion 20% limited due to pain and muscle tightness  Extension full  Right lateral flexion 25%  Left lateral flexion 50%  Right rotation full  Left rotation full   (Blank rows = not tested)  LOWER EXTREMITY ROM:     WFL  LOWER EXTREMITY MMT:    MMT Right eval Left eval  Hip flexion  Norm 25-26 20.3 26.9  Hip extension    Hip abduction  Norm: 45-47 14.3 18.5  Hip adduction    Hip internal rotation    Hip external rotation    Knee flexion    Knee extension  Norm 70-73 15.0 27.2  Ankle dorsiflexion    Ankle plantarflexion    Ankle inversion    Ankle eversion     (Blank rows = not tested)  LUMBAR SPECIAL TESTS:  Slump test: Negative  FUNCTIONAL TESTS:  5 times sit to stand: 31.75 Timed up and go (TUG): 23.81 4 stage test: leading lle: passed 1,2,3.  SLS x 6 s  GAIT: Distance walked: 400 ft Assistive device utilized: None Level of assistance: Complete Independence Comments: Slowed cadence, shorted step length off loading slightly towards left, just slight heel strike with toe off.  TODAY'S TREATMENT:  Pt seen for aquatic therapy today.  Treatment took place in  water 3.5-4.75 ft in depth at the [facility] pool. Temp of water was 91.  Pt entered/exited the pool via stairs and step to pattern with hand rail.  * UE on barbell:  walking forward x 2 lap, backward 1 lap, high knee marching forward/ walking backward 2 laps; side stepping R/L 2 laps * UE on wall (cues for posture):  Hip abdct/ addct x 10 each; LE swings into hip flex/ext x 10 each; heel raises/squats x 10 * seated on bench in water with blue step under feet:  STS x 6 with cues for forward arm reach, hip hinge, controlled descent and neutral head   Pt requires the buoyancy and hydrostatic pressure of water for support, and to offload joints by unweighting joint load by at least 50 % in navel deep water and by at least 75-80% in chest to neck deep water.  Viscosity of the water is needed for resistance of strengthening. Water current perturbations provides challenge to standing balance requiring increased core activation.   PATIENT EDUCATION:  Education details: aquatic therapy exercise progressions/ modifications Person educated: Patient Education method: Programmer, multimedia, demo  Education comprehension: verbalized understanding  HOME EXERCISE PROGRAM: TBA  ASSESSMENT:  CLINICAL IMPRESSION: Pt does not know how to swim, but does well with UE support on floatation aid when away from wall.  She reports some increase in Rt hip discomfort in Rt stance with LE exercises.  She requires cues throughout session for posture and form.  Pain level remains same throughout session.  Goals ongoing   Initial impression Patient is a 52 y.o. f who was seen today for physical therapy evaluation and treatment for Sacrococcygeal disorder. She has been disable for the past 2 years due to CVA and cardiac issues.  She had a fall which preceded diagnosis injuring her LB. Residual weakness from cva right sided.  She presents with significant strength deficits bilaterally and with functional testing she is a  high fall risk. She has muscle tightness throughout paraspinals which limit her thoracic and lumbar ROM.  She will benefit from skilled physical therpy to improve her functional mobility, Adl's and return her to an improved QOL. Plan on aquatic based initially then will transition to land based when deemed approp.  She reports reduction of pain in sacrum since recent injections.   OBJECTIVE IMPAIRMENTS: Abnormal gait, decreased activity tolerance, decreased balance, decreased endurance, decreased mobility, difficulty walking, decreased strength, postural dysfunction, and pain.   ACTIVITY LIMITATIONS: carrying, lifting, bending, sitting, standing, squatting, stairs, transfers, and locomotion level  PARTICIPATION LIMITATIONS: meal prep, cleaning, laundry, driving, shopping, community activity, occupation, and yard work  PERSONAL FACTORS: Age, Fitness, and 1-2 comorbidities: Past CVA, cardia dysfunction  are also affecting patient's functional outcome.   REHAB POTENTIAL: Good  CLINICAL DECISION MAKING: Evolving/moderate complexity  EVALUATION COMPLEXITY: Moderate   GOALS: Goals reviewed with patient? Yes  SHORT TERM GOALS: Target date: 08/25/23  Pt will tolerate full aquatic sessions consistently without increase in pain and with improving function to demonstrate good toleration and effectiveness of intervention.  Baseline: Goal status: INITIAL  2.  Pt will tolerate walking to and from store 4 x week (10 minutes ea way) to demonstrated improved toleration to waking Baseline:  Goal status: INITIAL  3.  Pt will report decrease in LBP and sacrum to 5/10 or less Baseline: 7/10 Goal status: INITIAL    LONG TERM GOALS: Target date: 09/17/23  Pt will  improve on LEFS by 9 points 40/80 to demonstrate improved function Baseline: 31/80 Goal status: INITIAL  2.  Pt will complete SLS x 20s to demonstrate improved balance Baseline:  Goal status: INITIAL  3.  Pt will demonstrate full  lumbar flex without increase in pain Baseline:  Goal status: INITIAL  4.  Pt will improve LE strength by at least 10 lbp to improve all functional mobility and decrease fall risk Baseline:  Goal status: INITIAL  5.  Pt will improve on 5 X STS test to <or=20s to demonstrate improving functional lower extremity strength, transitional movements, and balance Baseline:31.7  Goal status: INITIAL  6.  Pt will be indep with final HEP's (land and aquatic as appropriate) for continued management of condition Baseline: none Goal status: INITIAL  PLAN:  PT FREQUENCY: 1-2x/week  PT DURATION: 8 weeks 10 visits.  Extended time to allow for scheduling.  PLANNED INTERVENTIONS: Therapeutic exercises, Therapeutic activity, Neuromuscular re-education, Balance training, Gait training, Patient/Family education, Self Care, Joint mobilization, Joint manipulation, Stair training, Orthotic/Fit training, DME instructions, Aquatic Therapy, Dry Needling, Cryotherapy, Moist heat, scar mobilization, Taping, Ionotophoresis 4mg /ml Dexamethasone, Manual therapy, and Re-evaluation.  PLAN FOR NEXT SESSION: Aquatics: core and LE stretching; general strengthening; aerobic capacity training  Mayer Camel, PTA 08/10/23 9:02 AM [facility] [address] [place], [place], [address] Phone: [phone]   Fax:  [phone]

## 2023-08-12 ENCOUNTER — Ambulatory Visit (HOSPITAL_BASED_OUTPATIENT_CLINIC_OR_DEPARTMENT_OTHER): Payer: Medicaid Other | Admitting: Physical Therapy

## 2023-08-12 ENCOUNTER — Encounter (HOSPITAL_BASED_OUTPATIENT_CLINIC_OR_DEPARTMENT_OTHER): Payer: Self-pay | Admitting: Physical Therapy

## 2023-08-12 DIAGNOSIS — M25551 Pain in right hip: Secondary | ICD-10-CM

## 2023-08-12 DIAGNOSIS — M533 Sacrococcygeal disorders, not elsewhere classified: Secondary | ICD-10-CM | POA: Diagnosis not present

## 2023-08-12 DIAGNOSIS — M6281 Muscle weakness (generalized): Secondary | ICD-10-CM

## 2023-08-12 NOTE — Therapy (Signed)
OUTPATIENT PHYSICAL THERAPY THORACOLUMBAR TREATMENT   Patient Name: Megan Chaney MRN: [id number] DOB:1971-02-10, 52 y.o., female Today's Date: 08/12/2023  END OF SESSION:  PT End of Session - 08/12/23 0819     Visit Number 4    Number of Visits 10    Date for PT Re-Evaluation 09/19/23    Authorization Type [employer] mdcaid    PT Start Time [phone]    PT Stop Time 0900    PT Time Calculation (min) 43 min    Activity Tolerance Patient tolerated treatment well    Behavior During Therapy [facility] for tasks assessed/performed             Past Medical History:  Diagnosis Date   Anemia    Fibroid    GERD (gastroesophageal reflux disease)    Past Surgical History:  Procedure Laterality Date   BUBBLE STUDY  10/30/2021   Procedure: BUBBLE STUDY;  Surgeon: Wendall Stade, MD;  Location: [facility] ENDOSCOPY;  Service: Cardiovascular;;   LOOP RECORDER INSERTION N/A 10/30/2021   Procedure: LOOP RECORDER INSERTION;  Surgeon: Lanier Prude, MD;  Location: [facility] INVASIVE CV LAB;  Service: Cardiovascular;  Laterality: N/A;   MULTIPLE TOOTH EXTRACTIONS     NO PAST SURGERIES     TEE WITHOUT CARDIOVERSION N/A 10/30/2021   Procedure: TRANSESOPHAGEAL ECHOCARDIOGRAM (TEE);  Surgeon: Wendall Stade, MD;  Location: [facility] ENDOSCOPY;  Service: Cardiovascular;  Laterality: N/A;   Patient Active Problem List   Diagnosis Date Noted   Acute ischemic stroke (HCC) 10/26/2021   Infertility counseling 10/04/2019   Enlarged uterus 07/31/2018   Uterine leiomyoma 07/31/2018   DUB (dysfunctional uterine bleeding) 07/31/2018    PCP: Melida Quitter, MD   REFERRING PROVIDER: Maximiano Coss, MD   REFERRING DIAG: Sacrococcygeal disorders, not elsewhere classified   Rationale for Evaluation and Treatment: Rehabilitation  THERAPY DIAG:  Sacrococcygeal disorders, not elsewhere classified  Pain in right hip  Muscle weakness (generalized)  ONSET DATE: Jan 2023  SUBJECTIVE:                                                                                                                                                                                            SUBJECTIVE STATEMENT: Pt reports that "it is working" I have been walking more.   Initial evaluation:  Jan 2023 fell x 2 hurt right side. Had a stroke went to hospital and they found other things wrong, had a loop procedure before I left.  Had a catheterization  inMay.  I have been getting more energy as the year goes on. Anemia interfered and was unable to do therapy  for a few years.  Has had DN for back earlier this year, it did help.  Has steroid shots in shoulder and hip. Sacroiliac injections ~1-2 weeks with pain relief from 10/10 now 7/10.   PERTINENT HISTORY:  10/27/21 Multifocal acute/subacute nonhemorrhagic infarcts involving the left MCA territory.   PAIN:  Are you having pain? Yes: NPRS scale: 3/10 Pain location: across lumbar  Pain description: shooting/radiating Aggravating factors: weather, undefined Relieving factors: lying down, reposition, meds, recliner,sometimes walk  PRECAUTIONS: Fall  RED FLAGS: Bowel or bladder incontinence: Yes: but improved    WEIGHT BEARING RESTRICTIONS: no  FALLS:  Has patient fallen in last 6 months? No  LIVING ENVIRONMENT: Lives with: lives with their family Lives in: House/apartment Stairs:  outdoor 3 step handrail Has following equipment at home: None  OCCUPATION: disabled  PLOF: Independent  PATIENT GOALS: decrease/ manage pain, do daily activities like walking 10 mins  NEXT MD VISIT: next week  OBJECTIVE:   DIAGNOSTIC FINDINGS:  CT cervical spine 1/23: IMPRESSION: No recent fracture is seen in the cervical spine. Alignment of posterior margins of vertebral bodies is unremarkable. Cervical spondylosis with mild encroachment of neural foramina at C4-C5 and C5-C6 levels. MRI 1/23: IMPRESSION: 1. Multifocal acute/subacute nonhemorrhagic infarcts involving the left MCA  territory. Findings are predominantly inferior and posteriorly. There is cortical infarction also involves the more anterior middle and inferior frontal lobes. 2. No acute hemorrhage.  PATIENT SURVEYS:  LEFS:31/80   COGNITION: Overall cognitive status: Within functional limits for tasks assessed     SENSATION: Paresthesia through RLE   POSTURE: forward head and guarded/tight positioning  PALPATION: TTP about ASIS, SI area through mid thoracic area.  Muscle tightness throughout paraspinals  LUMBAR ROM:   AROM eval  Flexion 20% limited due to pain and muscle tightness  Extension full  Right lateral flexion 25%  Left lateral flexion 50%  Right rotation full  Left rotation full   (Blank rows = not tested)  LOWER EXTREMITY ROM:     WFL  LOWER EXTREMITY MMT:    MMT Right eval Left eval  Hip flexion  Norm 25-26 20.3 26.9  Hip extension    Hip abduction  Norm: 45-47 14.3 18.5  Hip adduction    Hip internal rotation    Hip external rotation    Knee flexion    Knee extension  Norm 70-73 15.0 27.2  Ankle dorsiflexion    Ankle plantarflexion    Ankle inversion    Ankle eversion     (Blank rows = not tested)  LUMBAR SPECIAL TESTS:  Slump test: Negative  FUNCTIONAL TESTS:  5 times sit to stand: 31.75 Timed up and go (TUG): 23.81 4 stage test: leading lle: passed 1,2,3.  SLS x 6 s  GAIT: Distance walked: 400 ft Assistive device utilized: None Level of assistance: Complete Independence Comments: Slowed cadence, shorted step length off loading slightly towards left, just slight heel strike with toe off.  TODAY'S TREATMENT:  Pt seen for aquatic therapy today.  Treatment took place in water 3.5-4.75 ft in depth at the [facility] pool. Temp of water was 91.  Pt entered/exited the pool via stairs and step to pattern with hand  rail.  * UE on barbell:  walking forward x 2 lap, backward 2 lap, high knee marching forward/ walking backward 2 laps; side stepping R/L 2 laps * UE on wall (continued cues for posture):  Hip abdct/ addct x 10 each; LE swings into hip flex/ext x 10 each; heel raises; toe raises; squats x 10 *L stretch holding 3-4s x 5.  Hip hiking x 3 *walking between exercises for recovery * seated on bench in water with blue step under feet:  STS x 10 with cues for forward arm reach, hip hinge, controlled descent and neutral head   Pt requires the buoyancy and hydrostatic pressure of water for support, and to offload joints by unweighting joint load by at least 50 % in navel deep water and by at least 75-80% in chest to neck deep water.  Viscosity of the water is needed for resistance of strengthening. Water current perturbations provides challenge to standing balance requiring increased core activation.   PATIENT EDUCATION:  Education details: aquatic therapy exercise progressions/ modifications Person educated: Patient Education method: Programmer, multimedia, demo  Education comprehension: verbalized understanding  HOME EXERCISE PROGRAM: TBA  ASSESSMENT:  CLINICAL IMPRESSION: Pt reports overall good response and progress with aquatic PT intervention.  She has walked to the store x 2 this week.  She does report fatigue afterwards which is resolved with resting.  She demonstrates improvement with STS from bench onto water step good eccentric and concentric control gaining immediate standing balance.  Was able to decrease ue support with exercise. Goals ongoing    Initial impression Patient is a 52 y.o. f who was seen today for physical therapy evaluation and treatment for Sacrococcygeal disorder. She has been disable for the past 2 years due to CVA and cardiac issues.  She had a fall which preceded diagnosis injuring her LB. Residual weakness from cva right sided.  She presents with significant strength deficits  bilaterally and with functional testing she is a high fall risk. She has muscle tightness throughout paraspinals which limit her thoracic and lumbar ROM.  She will benefit from skilled physical therpy to improve her functional mobility, Adl's and return her to an improved QOL. Plan on aquatic based initially then will transition to land based when deemed approp.  She reports reduction of pain in sacrum since recent injections.   OBJECTIVE IMPAIRMENTS: Abnormal gait, decreased activity tolerance, decreased balance, decreased endurance, decreased mobility, difficulty walking, decreased strength, postural dysfunction, and pain.   ACTIVITY LIMITATIONS: carrying, lifting, bending, sitting, standing, squatting, stairs, transfers, and locomotion level  PARTICIPATION LIMITATIONS: meal prep, cleaning, laundry, driving, shopping, community activity, occupation, and yard work  PERSONAL FACTORS: Age, Fitness, and 1-2 comorbidities: Past CVA, cardia dysfunction  are also affecting patient's functional outcome.   REHAB POTENTIAL: Good  CLINICAL DECISION MAKING: Evolving/moderate complexity  EVALUATION COMPLEXITY: Moderate   GOALS: Goals reviewed with patient? Yes  SHORT TERM GOALS: Target date: 08/25/23  Pt will tolerate full aquatic sessions consistently without increase in pain and with improving function to demonstrate good toleration and effectiveness of intervention.  Baseline: Goal status: INITIAL  2.  Pt will tolerate walking to and from store 4 x week (10 minutes ea way) to demonstrated improved toleration to waking Baseline:  Goal status: INITIAL  3.  Pt will report decrease in LBP and sacrum to 5/10 or less Baseline: 7/10 Goal status: INITIAL    LONG TERM GOALS: Target date: 09/17/23  Pt will improve on LEFS by 9 points 40/80 to demonstrate improved function Baseline: 31/80 Goal status: INITIAL  2.  Pt will complete SLS x 20s to demonstrate improved balance Baseline:  Goal  status: INITIAL  3.  Pt will demonstrate full lumbar flex without increase in pain Baseline:  Goal status: INITIAL  4.  Pt will improve LE strength by at least 10 lbp to improve all functional mobility and decrease fall risk Baseline:  Goal status: INITIAL  5.  Pt will improve on 5 X STS test to <or=20s to demonstrate improving functional lower extremity strength, transitional movements, and balance Baseline:31.7  Goal status: INITIAL  6.  Pt will be indep with final HEP's (land and aquatic as appropriate) for continued management of condition Baseline: none Goal status: INITIAL  PLAN:  PT FREQUENCY: 1-2x/week  PT DURATION: 8 weeks 10 visits.  Extended time to allow for scheduling.  PLANNED INTERVENTIONS: Therapeutic exercises, Therapeutic activity, Neuromuscular re-education, Balance training, Gait training, Patient/Family education, Self Care, Joint mobilization, Joint manipulation, Stair training, Orthotic/Fit training, DME instructions, Aquatic Therapy, Dry Needling, Cryotherapy, Moist heat, scar mobilization, Taping, Ionotophoresis 4mg /ml Dexamethasone, Manual therapy, and Re-evaluation.  PLAN FOR NEXT SESSION: Aquatics: core and LE stretching; general strengthening; aerobic capacity training  Megan Dandy Tomma Lightning) Demarrio Chaney MPT 08/12/23 8:20 AM [facility] [address] [place], [place], [address] Phone: [phone]   Fax:  [phone]

## 2023-08-17 ENCOUNTER — Encounter (HOSPITAL_BASED_OUTPATIENT_CLINIC_OR_DEPARTMENT_OTHER): Payer: Self-pay | Admitting: Physical Therapy

## 2023-08-17 ENCOUNTER — Ambulatory Visit (HOSPITAL_BASED_OUTPATIENT_CLINIC_OR_DEPARTMENT_OTHER): Payer: Medicaid Other | Admitting: Physical Therapy

## 2023-08-17 DIAGNOSIS — M533 Sacrococcygeal disorders, not elsewhere classified: Secondary | ICD-10-CM

## 2023-08-17 DIAGNOSIS — M25551 Pain in right hip: Secondary | ICD-10-CM

## 2023-08-17 DIAGNOSIS — M6281 Muscle weakness (generalized): Secondary | ICD-10-CM

## 2023-08-17 DIAGNOSIS — R2681 Unsteadiness on feet: Secondary | ICD-10-CM

## 2023-08-17 NOTE — Therapy (Signed)
OUTPATIENT PHYSICAL THERAPY THORACOLUMBAR TREATMENT   Patient Name: Megan Chaney MRN: [id number] DOB:1970/12/10, 52 y.o., female Today's Date: 08/17/2023  END OF SESSION:  PT End of Session - 08/17/23 0900     Number of Visits 10    Date for PT Re-Evaluation 09/19/23    Authorization Type [employer] mdcaid    PT Start Time 0900    PT Stop Time 0945    PT Time Calculation (min) 45 min    Activity Tolerance Patient tolerated treatment well    Behavior During Therapy WFL for tasks assessed/performed              Past Medical History:  Diagnosis Date   Anemia    Fibroid    GERD (gastroesophageal reflux disease)    Past Surgical History:  Procedure Laterality Date   BUBBLE STUDY  10/30/2021   Procedure: BUBBLE STUDY;  Surgeon: Wendall Stade, MD;  Location: [facility] ENDOSCOPY;  Service: Cardiovascular;;   LOOP RECORDER INSERTION N/A 10/30/2021   Procedure: LOOP RECORDER INSERTION;  Surgeon: Lanier Prude, MD;  Location: [facility] INVASIVE CV LAB;  Service: Cardiovascular;  Laterality: N/A;   MULTIPLE TOOTH EXTRACTIONS     NO PAST SURGERIES     TEE WITHOUT CARDIOVERSION N/A 10/30/2021   Procedure: TRANSESOPHAGEAL ECHOCARDIOGRAM (TEE);  Surgeon: Wendall Stade, MD;  Location: [facility] ENDOSCOPY;  Service: Cardiovascular;  Laterality: N/A;   Patient Active Problem List   Diagnosis Date Noted   Acute ischemic stroke (HCC) 10/26/2021   Infertility counseling 10/04/2019   Enlarged uterus 07/31/2018   Uterine leiomyoma 07/31/2018   DUB (dysfunctional uterine bleeding) 07/31/2018    PCP: Melida Quitter, MD   REFERRING PROVIDER: Maximiano Coss, MD   REFERRING DIAG: Sacrococcygeal disorders, not elsewhere classified   Rationale for Evaluation and Treatment: Rehabilitation  THERAPY DIAG:  Sacrococcygeal disorders, not elsewhere classified  Pain in right hip  Muscle weakness (generalized)  Unsteadiness on feet  ONSET DATE: Jan 2023  SUBJECTIVE:                                                                                                                                                                                            SUBJECTIVE STATEMENT: Pt reports some increase in back pain but she was active yesterday.   Initial evaluation:  Jan 2023 fell x 2 hurt right side. Had a stroke went to hospital and they found other things wrong, had a loop procedure before I left.  Had a catheterization  inMay.  I have been getting more energy as the year goes on. Anemia interfered and was unable to do therapy  for a few years.  Has had DN for back earlier this year, it did help.  Has steroid shots in shoulder and hip. Sacroiliac injections ~1-2 weeks with pain relief from 10/10 now 7/10.   PERTINENT HISTORY:  10/27/21 Multifocal acute/subacute nonhemorrhagic infarcts involving the left MCA territory.   PAIN:  Are you having pain? Yes: NPRS scale: 4-5/10 Pain location: across lumbar  Pain description: shooting/radiating Aggravating factors: weather, undefined Relieving factors: lying down, reposition, meds, recliner,sometimes walk  PRECAUTIONS: Fall  RED FLAGS: Bowel or bladder incontinence: Yes: but improved    WEIGHT BEARING RESTRICTIONS: no  FALLS:  Has patient fallen in last 6 months? No  LIVING ENVIRONMENT: Lives with: lives with their family Lives in: House/apartment Stairs:  outdoor 3 step handrail Has following equipment at home: None  OCCUPATION: disabled  PLOF: Independent  PATIENT GOALS: decrease/ manage pain, do daily activities like walking 10 mins  NEXT MD VISIT: next week  OBJECTIVE:   DIAGNOSTIC FINDINGS:  CT cervical spine 1/23: IMPRESSION: No recent fracture is seen in the cervical spine. Alignment of posterior margins of vertebral bodies is unremarkable. Cervical spondylosis with mild encroachment of neural foramina at C4-C5 and C5-C6 levels. MRI 1/23: IMPRESSION: 1. Multifocal acute/subacute nonhemorrhagic infarcts  involving the left MCA territory. Findings are predominantly inferior and posteriorly. There is cortical infarction also involves the more anterior middle and inferior frontal lobes. 2. No acute hemorrhage.  PATIENT SURVEYS:  LEFS:31/80   COGNITION: Overall cognitive status: Within functional limits for tasks assessed     SENSATION: Paresthesia through RLE   POSTURE: forward head and guarded/tight positioning  PALPATION: TTP about ASIS, SI area through mid thoracic area.  Muscle tightness throughout paraspinals  LUMBAR ROM:   AROM eval  Flexion 20% limited due to pain and muscle tightness  Extension full  Right lateral flexion 25%  Left lateral flexion 50%  Right rotation full  Left rotation full   (Blank rows = not tested)  LOWER EXTREMITY ROM:     WFL  LOWER EXTREMITY MMT:    MMT Right eval Left eval  Hip flexion  Norm 25-26 20.3 26.9  Hip extension    Hip abduction  Norm: 45-47 14.3 18.5  Hip adduction    Hip internal rotation    Hip external rotation    Knee flexion    Knee extension  Norm 70-73 15.0 27.2  Ankle dorsiflexion    Ankle plantarflexion    Ankle inversion    Ankle eversion     (Blank rows = not tested)  LUMBAR SPECIAL TESTS:  Slump test: Negative  FUNCTIONAL TESTS:  5 times sit to stand: 31.75 Timed up and go (TUG): 23.81 4 stage test: leading lle: passed 1,2,3.  SLS x 6 s  GAIT: Distance walked: 400 ft Assistive device utilized: None Level of assistance: Complete Independence Comments: Slowed cadence, shorted step length off loading slightly towards left, just slight heel strike with toe off.  TODAY'S TREATMENT:  Pt seen for aquatic therapy today.  Treatment took place in water 3.5-4.75 ft in depth at the [facility] pool. Temp of water was 91.  Pt entered/exited the pool via stairs and step to  pattern with hand rail.  * UE on yellow noodle:  walking forward x 2 lap, backward 2 lap, high knee marching forward/ walking backward 2 laps; side stepping R/L 2 laps *decompression noodle wrapped posteriorly across chest ue support corner of pool: cycling; hip add/abd; hip flex/ext * UE on wall (continued cues for posture):  heel raises; toe raises; squats x 10; squatted recovery period; L stretch holding 3-4s x 5.  *[place] x 8.  VC and demonstration/mirroring for execution * standing hip hinge x5  *STS bench onto water step x 5  -progressed to 3rd water step. VC and demonstration for execution.  Some discomfort LB.  Cues for abd bracing for control of core and decreased pain with movement *pelvic tilting and hip hiking sitting on noodle *walking between exercises for recovery   Pt requires the buoyancy and hydrostatic pressure of water for support, and to offload joints by unweighting joint load by at least 50 % in navel deep water and by at least 75-80% in chest to neck deep water.  Viscosity of the water is needed for resistance of strengthening. Water current perturbations provides challenge to standing balance requiring increased core activation.   PATIENT EDUCATION:  Education details: aquatic therapy exercise progressions/ modifications Person educated: Patient Education method: Programmer, multimedia, demo  Education comprehension: verbalized understanding  HOME EXERCISE PROGRAM: TBA  ASSESSMENT:  CLINICAL IMPRESSION: Pt requires multiple squatted rest periods today for pain relief.  Fair tolerance to progressed STS from bench (easy, eccentric and concentric control unsupported) to 3rd step which was a good challenge.  Continued instruction on abd bracing for improved core stability which decreases LBP with task. Goals ongoing      Initial impression Patient is a 52 y.o. f who was seen today for physical therapy evaluation and treatment for Sacrococcygeal disorder. She has been  disable for the past 2 years due to CVA and cardiac issues.  She had a fall which preceded diagnosis injuring her LB. Residual weakness from cva right sided.  She presents with significant strength deficits bilaterally and with functional testing she is a high fall risk. She has muscle tightness throughout paraspinals which limit her thoracic and lumbar ROM.  She will benefit from skilled physical therpy to improve her functional mobility, Adl's and return her to an improved QOL. Plan on aquatic based initially then will transition to land based when deemed approp.  She reports reduction of pain in sacrum since recent injections.   OBJECTIVE IMPAIRMENTS: Abnormal gait, decreased activity tolerance, decreased balance, decreased endurance, decreased mobility, difficulty walking, decreased strength, postural dysfunction, and pain.   ACTIVITY LIMITATIONS: carrying, lifting, bending, sitting, standing, squatting, stairs, transfers, and locomotion level  PARTICIPATION LIMITATIONS: meal prep, cleaning, laundry, driving, shopping, community activity, occupation, and yard work  PERSONAL FACTORS: Age, Fitness, and 1-2 comorbidities: Past CVA, cardia dysfunction  are also affecting patient's functional outcome.   REHAB POTENTIAL: Good  CLINICAL DECISION MAKING: Evolving/moderate complexity  EVALUATION COMPLEXITY: Moderate   GOALS: Goals reviewed with patient? Yes  SHORT TERM GOALS: Target date: 08/25/23  Pt will tolerate full aquatic sessions consistently without increase in pain and with improving function to demonstrate good toleration and effectiveness of intervention.  Baseline: Goal status: INITIAL  2.  Pt will tolerate walking to and  from store 4 x week (10 minutes ea way) to demonstrated improved toleration to waking Baseline:  Goal status: INITIAL  3.  Pt will report decrease in LBP and sacrum to 5/10 or less Baseline: 7/10 Goal status: INITIAL    LONG TERM GOALS: Target date:  09/17/23  Pt will improve on LEFS by 9 points 40/80 to demonstrate improved function Baseline: 31/80 Goal status: INITIAL  2.  Pt will complete SLS x 20s to demonstrate improved balance Baseline:  Goal status: INITIAL  3.  Pt will demonstrate full lumbar flex without increase in pain Baseline:  Goal status: INITIAL  4.  Pt will improve LE strength by at least 10 lbp to improve all functional mobility and decrease fall risk Baseline:  Goal status: INITIAL  5.  Pt will improve on 5 X STS test to <or=20s to demonstrate improving functional lower extremity strength, transitional movements, and balance Baseline:31.7  Goal status: INITIAL  6.  Pt will be indep with final HEP's (land and aquatic as appropriate) for continued management of condition Baseline: none Goal status: INITIAL  PLAN:  PT FREQUENCY: 1-2x/week  PT DURATION: 8 weeks 10 visits.  Extended time to allow for scheduling.  PLANNED INTERVENTIONS: Therapeutic exercises, Therapeutic activity, Neuromuscular re-education, Balance training, Gait training, Patient/Family education, Self Care, Joint mobilization, Joint manipulation, Stair training, Orthotic/Fit training, DME instructions, Aquatic Therapy, Dry Needling, Cryotherapy, Moist heat, scar mobilization, Taping, Ionotophoresis 4mg /ml Dexamethasone, Manual therapy, and Re-evaluation.  PLAN FOR NEXT SESSION: Aquatics: core and LE stretching; general strengthening; aerobic capacity training  Corrie Dandy Tomma Lightning) Kemuel Buchmann MPT 08/17/23 9:00 AM [facility] [address] [place], [place], [address] Phone: [phone]   Fax:  [phone]

## 2023-08-19 ENCOUNTER — Encounter (HOSPITAL_BASED_OUTPATIENT_CLINIC_OR_DEPARTMENT_OTHER): Payer: Self-pay | Admitting: Physical Therapy

## 2023-08-19 ENCOUNTER — Ambulatory Visit (HOSPITAL_BASED_OUTPATIENT_CLINIC_OR_DEPARTMENT_OTHER): Payer: Medicaid Other | Admitting: Physical Therapy

## 2023-08-19 DIAGNOSIS — M25551 Pain in right hip: Secondary | ICD-10-CM

## 2023-08-19 DIAGNOSIS — M533 Sacrococcygeal disorders, not elsewhere classified: Secondary | ICD-10-CM | POA: Diagnosis not present

## 2023-08-19 DIAGNOSIS — M6281 Muscle weakness (generalized): Secondary | ICD-10-CM

## 2023-08-19 DIAGNOSIS — R2681 Unsteadiness on feet: Secondary | ICD-10-CM

## 2023-08-19 NOTE — Therapy (Signed)
OUTPATIENT PHYSICAL THERAPY THORACOLUMBAR TREATMENT   Patient Name: Megan Chaney MRN: [id number] DOB:[birth year], 52 y.o., female Today's Date: 08/19/2023  END OF SESSION:  PT End of Session - 08/19/23 0828     Visit Number 6    Number of Visits 10    Date for PT Re-Evaluation 09/19/23    Authorization Type [employer] mdcaid    PT Start Time 0815    PT Stop Time 0855    PT Time Calculation (min) 40 min    Activity Tolerance Patient tolerated treatment well    Behavior During Therapy [facility] for tasks assessed/performed               Past Medical History:  Diagnosis Date   Anemia    Fibroid    GERD (gastroesophageal reflux disease)    Past Surgical History:  Procedure Laterality Date   BUBBLE STUDY  10/30/2021   Procedure: BUBBLE STUDY;  Surgeon: Wendall Stade, MD;  Location: [facility] ENDOSCOPY;  Service: Cardiovascular;;   LOOP RECORDER INSERTION N/A 10/30/2021   Procedure: LOOP RECORDER INSERTION;  Surgeon: Lanier Prude, MD;  Location: [facility] INVASIVE CV LAB;  Service: Cardiovascular;  Laterality: N/A;   MULTIPLE TOOTH EXTRACTIONS     NO PAST SURGERIES     TEE WITHOUT CARDIOVERSION N/A 10/30/2021   Procedure: TRANSESOPHAGEAL ECHOCARDIOGRAM (TEE);  Surgeon: Wendall Stade, MD;  Location: [facility] ENDOSCOPY;  Service: Cardiovascular;  Laterality: N/A;   Patient Active Problem List   Diagnosis Date Noted   Acute ischemic stroke (HCC) 10/26/2021   Infertility counseling 10/04/2019   Enlarged uterus 07/31/2018   Uterine leiomyoma 07/31/2018   DUB (dysfunctional uterine bleeding) 07/31/2018    PCP: Melida Quitter, MD   REFERRING PROVIDER: Maximiano Coss, MD   REFERRING DIAG: Sacrococcygeal disorders, not elsewhere classified   Rationale for Evaluation and Treatment: Rehabilitation  THERAPY DIAG:  Sacrococcygeal disorders, not elsewhere classified  Pain in right hip  Muscle weakness (generalized)  Unsteadiness on feet  ONSET DATE: Jan 2023  SUBJECTIVE:                                                                                                                                                                                            SUBJECTIVE STATEMENT: Pt reports that her nephew is in ICU and she is worried about him.  She reports she is getting 1 day of relief after being in pool.  She reports that her RLE is "moving much better than when I started".    Initial evaluation:  Jan 2023 fell x 2 hurt right side. Had a stroke went to hospital and  they found other things wrong, had a loop procedure before I left.  Had a catheterization  inMay.  I have been getting more energy as the year goes on. Anemia interfered and was unable to do therapy for a few years.  Has had DN for back earlier this year, it did help.  Has steroid shots in shoulder and hip. Sacroiliac injections ~1-2 weeks with pain relief from 10/10 now 7/10.   PERTINENT HISTORY:  10/27/21 Multifocal acute/subacute nonhemorrhagic infarcts involving the left MCA territory.   PAIN:  Are you having pain? Yes: NPRS scale: 4/10 Pain location: across lumbar, Rt hip, R upper trap/shoulder Pain description: shooting/radiating Aggravating factors: weather, undefined Relieving factors: lying down, reposition, meds, recliner,sometimes walk  PRECAUTIONS: Fall  RED FLAGS: Bowel or bladder incontinence: Yes: but improved    WEIGHT BEARING RESTRICTIONS: no  FALLS:  Has patient fallen in last 6 months? No  LIVING ENVIRONMENT: Lives with: lives with their family Lives in: House/apartment Stairs:  outdoor 3 step handrail Has following equipment at home: None  OCCUPATION: disabled  PLOF: Independent  PATIENT GOALS: decrease/ manage pain, do daily activities like walking 10 mins  NEXT MD VISIT: next week  OBJECTIVE:   DIAGNOSTIC FINDINGS:  CT cervical spine 1/23: IMPRESSION: No recent fracture is seen in the cervical spine. Alignment of posterior margins of vertebral bodies is  unremarkable. Cervical spondylosis with mild encroachment of neural foramina at C4-C5 and C5-C6 levels. MRI 1/23: IMPRESSION: 1. Multifocal acute/subacute nonhemorrhagic infarcts involving the left MCA territory. Findings are predominantly inferior and posteriorly. There is cortical infarction also involves the more anterior middle and inferior frontal lobes. 2. No acute hemorrhage.  PATIENT SURVEYS:  LEFS:31/80   COGNITION: Overall cognitive status: Within functional limits for tasks assessed     SENSATION: Paresthesia through RLE   POSTURE: forward head and guarded/tight positioning  PALPATION: TTP about ASIS, SI area through mid thoracic area.  Muscle tightness throughout paraspinals  LUMBAR ROM:   AROM eval  Flexion 20% limited due to pain and muscle tightness  Extension full  Right lateral flexion 25%  Left lateral flexion 50%  Right rotation full  Left rotation full   (Blank rows = not tested)  LOWER EXTREMITY ROM:     WFL  LOWER EXTREMITY MMT:    MMT Right eval Left eval  Hip flexion  Norm 25-26 20.3 26.9  Hip extension    Hip abduction  Norm: 45-47 14.3 18.5  Hip adduction    Hip internal rotation    Hip external rotation    Knee flexion    Knee extension  Norm 70-73 15.0 27.2  Ankle dorsiflexion    Ankle plantarflexion    Ankle inversion    Ankle eversion     (Blank rows = not tested)  LUMBAR SPECIAL TESTS:  Slump test: Negative  FUNCTIONAL TESTS:  5 times sit to stand: 31.75 Timed up and go (TUG): 23.81 4 stage test: leading lle: passed 1,2,3.  SLS x 6 s  GAIT: Distance walked: 400 ft Assistive device utilized: None Level of assistance: Complete Independence Comments: Slowed cadence, shorted step length off loading slightly towards left, just slight heel strike with toe off.  TODAY'S TREATMENT:  Pt seen for  aquatic therapy today.  Treatment took place in water 3.5-4.75 ft in depth at the [facility] pool. Temp of water was 91.  Pt entered/exited the pool via stairs and step to pattern with hand rail.  * UE on yellow noodle:  walking forward x 2 lap, backward 2 lap; side stepping R/L 2 laps -> side step into wide squat  x 2 laps (demo and cues needed for execution); forward walking kick x 2 laps;  high knee marching with row motion with UE on noodle x 2 laps *decompression noodle wrapped posteriorly across chest ue support corner of pool: cycling; hip add/abd; hip flex/ext * UE on wall (continued cues for posture):  heel raises x 10; squats x 10; hip abdct/ addct x10; Hip ext x 10  * seated on bench in water with feet on blue step: STS  10 with cues for core engaged, with neutral head and pelvis * L stretch at rails x 15s x 2 * alternating toe taps to 1st step with intermittent UE support on rails, cues for neutral Rt lower leg  * Rt forward step up/ Lt retro step down x 10   Pt requires the buoyancy and hydrostatic pressure of water for support, and to offload joints by unweighting joint load by at least 50 % in navel deep water and by at least 75-80% in chest to neck deep water.  Viscosity of the water is needed for resistance of strengthening. Water current perturbations provides challenge to standing balance requiring increased core activation.   PATIENT EDUCATION:  Education details: aquatic therapy exercise progressions/ modifications Person educated: Patient Education method: Programmer, multimedia, demo  Education comprehension: verbalized understanding  HOME EXERCISE PROGRAM: TBA  ASSESSMENT:  CLINICAL IMPRESSION: Positive response to aquatic therapy thus far with reports of improved mobility and less pain. Pt required only short rest period today during session; exercise tolerance and confidence in water improving.  Will continue to progress as tolerated. Therapist to check STG next  session.       Initial impression Patient is a 52 y.o. f who was seen today for physical therapy evaluation and treatment for Sacrococcygeal disorder. She has been disable for the past 2 years due to CVA and cardiac issues.  She had a fall which preceded diagnosis injuring her LB. Residual weakness from cva right sided.  She presents with significant strength deficits bilaterally and with functional testing she is a high fall risk. She has muscle tightness throughout paraspinals which limit her thoracic and lumbar ROM.  She will benefit from skilled physical therpy to improve her functional mobility, Adl's and return her to an improved QOL. Plan on aquatic based initially then will transition to land based when deemed approp.  She reports reduction of pain in sacrum since recent injections.   OBJECTIVE IMPAIRMENTS: Abnormal gait, decreased activity tolerance, decreased balance, decreased endurance, decreased mobility, difficulty walking, decreased strength, postural dysfunction, and pain.   ACTIVITY LIMITATIONS: carrying, lifting, bending, sitting, standing, squatting, stairs, transfers, and locomotion level  PARTICIPATION LIMITATIONS: meal prep, cleaning, laundry, driving, shopping, community activity, occupation, and yard work  PERSONAL FACTORS: Age, Fitness, and 1-2 comorbidities: Past CVA, cardia dysfunction  are also affecting patient's functional outcome.   REHAB POTENTIAL: Good  CLINICAL DECISION MAKING: Evolving/moderate complexity  EVALUATION COMPLEXITY: Moderate   GOALS: Goals reviewed with patient? Yes  SHORT TERM GOALS: Target date: 08/25/23  Pt will tolerate full aquatic sessions consistently without increase in pain and with improving function to  demonstrate good toleration and effectiveness of intervention.  Baseline: Goal status: INITIAL  2.  Pt will tolerate walking to and from store 4 x week (10 minutes ea way) to demonstrated improved toleration to  waking Baseline:  Goal status: INITIAL  3.  Pt will report decrease in LBP and sacrum to 5/10 or less Baseline: 7/10 Goal status: INITIAL    LONG TERM GOALS: Target date: 09/17/23  Pt will improve on LEFS by 9 points 40/80 to demonstrate improved function Baseline: 31/80 Goal status: INITIAL  2.  Pt will complete SLS x 20s to demonstrate improved balance Baseline:  Goal status: INITIAL  3.  Pt will demonstrate full lumbar flex without increase in pain Baseline:  Goal status: INITIAL  4.  Pt will improve LE strength by at least 10 lbp to improve all functional mobility and decrease fall risk Baseline:  Goal status: INITIAL  5.  Pt will improve on 5 X STS test to <or=20s to demonstrate improving functional lower extremity strength, transitional movements, and balance Baseline:31.7  Goal status: INITIAL  6.  Pt will be indep with final HEP's (land and aquatic as appropriate) for continued management of condition Baseline: none Goal status: INITIAL  PLAN:  PT FREQUENCY: 1-2x/week  PT DURATION: 8 weeks 10 visits.  Extended time to allow for scheduling.  PLANNED INTERVENTIONS: Therapeutic exercises, Therapeutic activity, Neuromuscular re-education, Balance training, Gait training, Patient/Family education, Self Care, Joint mobilization, Joint manipulation, Stair training, Orthotic/Fit training, DME instructions, Aquatic Therapy, Dry Needling, Cryotherapy, Moist heat, scar mobilization, Taping, Ionotophoresis 4mg /ml Dexamethasone, Manual therapy, and Re-evaluation.  PLAN FOR NEXT SESSION: Aquatics: core and LE stretching; general strengthening; aerobic capacity training   Mayer Camel, PTA 08/19/23 12:25 PM [facility] [address] [place], [place], [address] Phone: [phone]   Fax:  [phone]

## 2023-08-22 ENCOUNTER — Ambulatory Visit (HOSPITAL_BASED_OUTPATIENT_CLINIC_OR_DEPARTMENT_OTHER): Payer: Medicaid Other | Admitting: Physical Therapy

## 2023-08-22 ENCOUNTER — Encounter (HOSPITAL_BASED_OUTPATIENT_CLINIC_OR_DEPARTMENT_OTHER): Payer: Self-pay | Admitting: Physical Therapy

## 2023-08-22 DIAGNOSIS — M6281 Muscle weakness (generalized): Secondary | ICD-10-CM

## 2023-08-22 DIAGNOSIS — M25551 Pain in right hip: Secondary | ICD-10-CM

## 2023-08-22 DIAGNOSIS — R2681 Unsteadiness on feet: Secondary | ICD-10-CM

## 2023-08-22 DIAGNOSIS — M533 Sacrococcygeal disorders, not elsewhere classified: Secondary | ICD-10-CM

## 2023-08-22 NOTE — Therapy (Signed)
OUTPATIENT PHYSICAL THERAPY THORACOLUMBAR TREATMENT   Patient Name: Megan Chaney MRN: [id number] DOB:09-13-1971, 52 y.o., female Today's Date: 08/22/2023  END OF SESSION:  PT End of Session - 08/22/23 0828     Visit Number 7    Number of Visits 10    Date for PT Re-Evaluation 09/19/23    Authorization Type [employer] mdcaid    PT Start Time 0815    PT Stop Time 0855    PT Time Calculation (min) 40 min    Behavior During Therapy [facility] for tasks assessed/performed               Past Medical History:  Diagnosis Date   Anemia    Fibroid    GERD (gastroesophageal reflux disease)    Past Surgical History:  Procedure Laterality Date   BUBBLE STUDY  10/30/2021   Procedure: BUBBLE STUDY;  Surgeon: Wendall Stade, MD;  Location: [facility] ENDOSCOPY;  Service: Cardiovascular;;   LOOP RECORDER INSERTION N/A 10/30/2021   Procedure: LOOP RECORDER INSERTION;  Surgeon: Lanier Prude, MD;  Location: [facility] INVASIVE CV LAB;  Service: Cardiovascular;  Laterality: N/A;   MULTIPLE TOOTH EXTRACTIONS     NO PAST SURGERIES     TEE WITHOUT CARDIOVERSION N/A 10/30/2021   Procedure: TRANSESOPHAGEAL ECHOCARDIOGRAM (TEE);  Surgeon: Wendall Stade, MD;  Location: [facility] ENDOSCOPY;  Service: Cardiovascular;  Laterality: N/A;   Patient Active Problem List   Diagnosis Date Noted   Acute ischemic stroke (HCC) 10/26/2021   Infertility counseling 10/04/2019   Enlarged uterus 07/31/2018   Uterine leiomyoma 07/31/2018   DUB (dysfunctional uterine bleeding) 07/31/2018    PCP: Melida Quitter, MD   REFERRING PROVIDER: Maximiano Coss, MD   REFERRING DIAG: Sacrococcygeal disorders, not elsewhere classified   Rationale for Evaluation and Treatment: Rehabilitation  THERAPY DIAG:  Sacrococcygeal disorders, not elsewhere classified  Pain in right hip  Muscle weakness (generalized)  Unsteadiness on feet  ONSET DATE: Jan 2023  SUBJECTIVE:                                                                                                                                                                                            SUBJECTIVE STATEMENT: Pt reports that her pain is not as intense as when she first started therapy, no longer a 10 or a 7.   She reports that the weather affects her pain level. Her Rt shoulder has been more painful.    Initial evaluation:  Jan 2023 fell x 2 hurt right side. Had a stroke went to hospital and they found other things wrong, had a loop procedure before I  left.  Had a catheterization  inMay.  I have been getting more energy as the year goes on. Anemia interfered and was unable to do therapy for a few years.  Has had DN for back earlier this year, it did help.  Has steroid shots in shoulder and hip. Sacroiliac injections ~1-2 weeks with pain relief from 10/10 now 7/10.   PERTINENT HISTORY:  10/27/21 Multifocal acute/subacute nonhemorrhagic infarcts involving the left MCA territory.   PAIN:  Are you having pain? Yes: NPRS scale: 3-4/10 Pain location: across lumbar, Rt hip, R upper trap/shoulder Pain description: shooting/radiating Aggravating factors: weather, undefined Relieving factors: lying down, reposition, meds, recliner,sometimes walk  PRECAUTIONS: Fall  RED FLAGS: Bowel or bladder incontinence: Yes: but improved    WEIGHT BEARING RESTRICTIONS: no  FALLS:  Has patient fallen in last 6 months? No  LIVING ENVIRONMENT: Lives with: lives with their family Lives in: House/apartment Stairs:  outdoor 3 step handrail Has following equipment at home: None  OCCUPATION: disabled  PLOF: Independent  PATIENT GOALS: decrease/ manage pain, do daily activities like walking 10 mins  NEXT MD VISIT: next week  OBJECTIVE:   DIAGNOSTIC FINDINGS:  CT cervical spine 1/23: IMPRESSION: No recent fracture is seen in the cervical spine. Alignment of posterior margins of vertebral bodies is unremarkable. Cervical spondylosis with mild encroachment of  neural foramina at C4-C5 and C5-C6 levels. MRI 1/23: IMPRESSION: 1. Multifocal acute/subacute nonhemorrhagic infarcts involving the left MCA territory. Findings are predominantly inferior and posteriorly. There is cortical infarction also involves the more anterior middle and inferior frontal lobes. 2. No acute hemorrhage.  PATIENT SURVEYS:  LEFS:31/80   COGNITION: Overall cognitive status: Within functional limits for tasks assessed     SENSATION: Paresthesia through RLE   POSTURE: forward head and guarded/tight positioning  PALPATION: TTP about ASIS, SI area through mid thoracic area.  Muscle tightness throughout paraspinals  LUMBAR ROM:   AROM eval  Flexion 20% limited due to pain and muscle tightness  Extension full  Right lateral flexion 25%  Left lateral flexion 50%  Right rotation full  Left rotation full   (Blank rows = not tested)  LOWER EXTREMITY ROM:     WFL  LOWER EXTREMITY MMT:    MMT Right eval Left eval  Hip flexion  Norm 25-26 20.3 26.9  Hip extension    Hip abduction  Norm: 45-47 14.3 18.5  Hip adduction    Hip internal rotation    Hip external rotation    Knee flexion    Knee extension  Norm 70-73 15.0 27.2  Ankle dorsiflexion    Ankle plantarflexion    Ankle inversion    Ankle eversion     (Blank rows = not tested)  LUMBAR SPECIAL TESTS:  Slump test: Negative  FUNCTIONAL TESTS:  5 times sit to stand: 31.75 Timed up and go (TUG): 23.81 4 stage test: leading lle: passed 1,2,3.  SLS x 6 s  GAIT: Distance walked: 400 ft Assistive device utilized: None Level of assistance: Complete Independence Comments: Slowed cadence, shorted step length off loading slightly towards left, just slight heel strike with toe off.  TODAY'S TREATMENT:  Pt seen for aquatic therapy today.  Treatment took place in water 3.5-4.75  ft in depth at the [facility] pool. Temp of water was 91.  Pt entered/exited the pool via stairs and step to pattern with hand rail.  * UE on barbell with fin donned on Rt ankle:  walking forward x 2 lap, backward 2 lap; side stepping R/L 2 laps forward walking kick x 2 laps;  high knee marching  * UE on wall with fin donned on Rt ankle:  Rt kick (hip flex to LAQ to hip ext with straight knee x 15); hip abdct/ addct x 10 * L stretch for shoulder flexion to RUE and low back stretch * TrA set with short hollow noodle pull down to thighs x 10 * SLS - 3 reps each LE,  up to 20s LLE, 11 RLE *decompression noodle wrapped posteriorly across chest ue support corner of pool: cycling; hip add/abd; hip flex/ext * UE on rainbow hand floats:  tricep push downs x 10; bilat abdct/ addct x 10; bilat horiz abdct/ addct x 10;  * light UE on wall: tandem gait forward/ backward (challenge)   Pt requires the buoyancy and hydrostatic pressure of water for support, and to offload joints by unweighting joint load by at least 50 % in navel deep water and by at least 75-80% in chest to neck deep water.  Viscosity of the water is needed for resistance of strengthening. Water current perturbations provides challenge to standing balance requiring increased core activation.   PATIENT EDUCATION:  Education details: aquatic therapy exercise progressions/ modifications Person educated: Patient Education method: Programmer, multimedia, demo  Education comprehension: verbalized understanding  HOME EXERCISE PROGRAM: TBA  ASSESSMENT:  CLINICAL IMPRESSION: Positive response to aquatic therapy thus far with reports of improved mobility and less pain. She requires cues for RUE/RLE placement / form. Utilized fin on R ankle for increased resistance and increased proprioception.   Will continue to progress as tolerated. Pt has met STG 1 and 3. Ck LEFS next session, if time allows.    Initial impression Patient is a 52 y.o. f  who was seen today for physical therapy evaluation and treatment for Sacrococcygeal disorder. She has been disable for the past 2 years due to CVA and cardiac issues.  She had a fall which preceded diagnosis injuring her LB. Residual weakness from cva right sided.  She presents with significant strength deficits bilaterally and with functional testing she is a high fall risk. She has muscle tightness throughout paraspinals which limit her thoracic and lumbar ROM.  She will benefit from skilled physical therpy to improve her functional mobility, Adl's and return her to an improved QOL. Plan on aquatic based initially then will transition to land based when deemed approp.  She reports reduction of pain in sacrum since recent injections.   OBJECTIVE IMPAIRMENTS: Abnormal gait, decreased activity tolerance, decreased balance, decreased endurance, decreased mobility, difficulty walking, decreased strength, postural dysfunction, and pain.   ACTIVITY LIMITATIONS: carrying, lifting, bending, sitting, standing, squatting, stairs, transfers, and locomotion level  PARTICIPATION LIMITATIONS: meal prep, cleaning, laundry, driving, shopping, community activity, occupation, and yard work  PERSONAL FACTORS: Age, Fitness, and 1-2 comorbidities: Past CVA, cardia dysfunction  are also affecting patient's functional outcome.   REHAB POTENTIAL: Good  CLINICAL DECISION MAKING: Evolving/moderate complexity  EVALUATION COMPLEXITY: Moderate   GOALS: Goals reviewed with patient? Yes  SHORT TERM GOALS: Target date: 08/25/23  Pt will tolerate full aquatic sessions consistently without increase in pain and with  improving function to demonstrate good toleration and effectiveness of intervention.  Baseline: Goal status:Met - 08/22/23  2.  Pt will tolerate walking to and from store 4 x week (10 minutes ea way) to demonstrated improved toleration to waking Baseline:  Goal status: INITIAL  3.  Pt will report decrease in  LBP and sacrum to 5/10 or less Baseline: 7/10 Goal status: MET - 08/22/23    LONG TERM GOALS: Target date: 09/17/23  Pt will improve on LEFS by 9 points 40/80 to demonstrate improved function Baseline: 31/80 Goal status: INITIAL  2.  Pt will complete SLS x 20s to demonstrate improved balance Baseline:  Goal status: INITIAL  3.  Pt will demonstrate full lumbar flex without increase in pain Baseline:  Goal status: INITIAL  4.  Pt will improve LE strength by at least 10 lbp to improve all functional mobility and decrease fall risk Baseline:  Goal status: INITIAL  5.  Pt will improve on 5 X STS test to <or=20s to demonstrate improving functional lower extremity strength, transitional movements, and balance Baseline:31.7  Goal status: INITIAL  6.  Pt will be indep with final HEP's (land and aquatic as appropriate) for continued management of condition Baseline: none Goal status: INITIAL  PLAN:  PT FREQUENCY: 1-2x/week  PT DURATION: 8 weeks 10 visits.  Extended time to allow for scheduling.  PLANNED INTERVENTIONS: Therapeutic exercises, Therapeutic activity, Neuromuscular re-education, Balance training, Gait training, Patient/Family education, Self Care, Joint mobilization, Joint manipulation, Stair training, Orthotic/Fit training, DME instructions, Aquatic Therapy, Dry Needling, Cryotherapy, Moist heat, scar mobilization, Taping, Ionotophoresis 4mg /ml Dexamethasone, Manual therapy, and Re-evaluation.  PLAN FOR NEXT SESSION: Aquatics: core and LE stretching; general strengthening; aerobic capacity training   Mayer Camel, PTA 08/22/23 8:58 AM [facility] [address] [place], [place], [address] Phone: [phone]   Fax:  [phone]

## 2023-08-23 NOTE — Progress Notes (Signed)
Carelink Summary Report / Loop Recorder 

## 2023-08-25 ENCOUNTER — Ambulatory Visit (HOSPITAL_BASED_OUTPATIENT_CLINIC_OR_DEPARTMENT_OTHER): Payer: Medicaid Other | Admitting: Physical Therapy

## 2023-08-25 ENCOUNTER — Encounter (HOSPITAL_BASED_OUTPATIENT_CLINIC_OR_DEPARTMENT_OTHER): Payer: Self-pay | Admitting: Physical Therapy

## 2023-08-25 DIAGNOSIS — M533 Sacrococcygeal disorders, not elsewhere classified: Secondary | ICD-10-CM

## 2023-08-25 DIAGNOSIS — M25551 Pain in right hip: Secondary | ICD-10-CM

## 2023-08-25 DIAGNOSIS — R2681 Unsteadiness on feet: Secondary | ICD-10-CM

## 2023-08-25 DIAGNOSIS — M6281 Muscle weakness (generalized): Secondary | ICD-10-CM

## 2023-08-25 NOTE — Therapy (Signed)
OUTPATIENT PHYSICAL THERAPY THORACOLUMBAR TREATMENT   Patient Name: Megan Chaney MRN: [id number] DOB:05-Feb-1971, 52 y.o., female Today's Date: 08/25/2023  END OF SESSION:  PT End of Session - 08/25/23 0818     Visit Number 8    Number of Visits 10    Date for PT Re-Evaluation 09/19/23    Authorization Type [employer] mdcaid    PT Start Time 0815    PT Stop Time 0855    PT Time Calculation (min) 40 min    Activity Tolerance Patient tolerated treatment well    Behavior During Therapy [facility] for tasks assessed/performed               Past Medical History:  Diagnosis Date   Anemia    Fibroid    GERD (gastroesophageal reflux disease)    Past Surgical History:  Procedure Laterality Date   BUBBLE STUDY  10/30/2021   Procedure: BUBBLE STUDY;  Surgeon: Wendall Stade, MD;  Location: [facility] ENDOSCOPY;  Service: Cardiovascular;;   LOOP RECORDER INSERTION N/A 10/30/2021   Procedure: LOOP RECORDER INSERTION;  Surgeon: Lanier Prude, MD;  Location: [facility] INVASIVE CV LAB;  Service: Cardiovascular;  Laterality: N/A;   MULTIPLE TOOTH EXTRACTIONS     NO PAST SURGERIES     TEE WITHOUT CARDIOVERSION N/A 10/30/2021   Procedure: TRANSESOPHAGEAL ECHOCARDIOGRAM (TEE);  Surgeon: Wendall Stade, MD;  Location: [facility] ENDOSCOPY;  Service: Cardiovascular;  Laterality: N/A;   Patient Active Problem List   Diagnosis Date Noted   Acute ischemic stroke (HCC) 10/26/2021   Infertility counseling 10/04/2019   Enlarged uterus 07/31/2018   Uterine leiomyoma 07/31/2018   DUB (dysfunctional uterine bleeding) 07/31/2018    PCP: Melida Quitter, MD   REFERRING PROVIDER: Maximiano Coss, MD   REFERRING DIAG: Sacrococcygeal disorders, not elsewhere classified   Rationale for Evaluation and Treatment: Rehabilitation  THERAPY DIAG:  Sacrococcygeal disorders, not elsewhere classified  Pain in right hip  Muscle weakness (generalized)  Unsteadiness on feet  ONSET DATE: Jan 2023  SUBJECTIVE:                                                                                                                                                                                            SUBJECTIVE STATEMENT: Pt reports that she wasn't as tired after last session, "getting stronger".  Rt arm is not as painful today.    Initial evaluation:  Jan 2023 fell x 2 hurt right side. Had a stroke went to hospital and they found other things wrong, had a loop procedure before I left.  Had a catheterization  inMay.  I have been  getting more energy as the year goes on. Anemia interfered and was unable to do therapy for a few years.  Has had DN for back earlier this year, it did help.  Has steroid shots in shoulder and hip. Sacroiliac injections ~1-2 weeks with pain relief from 10/10 now 7/10.   PERTINENT HISTORY:  10/27/21 Multifocal acute/subacute nonhemorrhagic infarcts involving the left MCA territory.   PAIN:  Are you having pain? Yes: NPRS scale: 3/10 Pain location: Rt hip,  Pain description: tender Aggravating factors: weather, undefined Relieving factors: lying down, reposition, meds, recliner,sometimes walk  PRECAUTIONS: Fall  RED FLAGS: Bowel or bladder incontinence: Yes: but improved    WEIGHT BEARING RESTRICTIONS: no  FALLS:  Has patient fallen in last 6 months? No  LIVING ENVIRONMENT: Lives with: lives with their family Lives in: House/apartment Stairs:  outdoor 3 step handrail Has following equipment at home: None  OCCUPATION: disabled  PLOF: Independent  PATIENT GOALS: decrease/ manage pain, do daily activities like walking 10 mins  NEXT MD VISIT: next week  OBJECTIVE:   DIAGNOSTIC FINDINGS:  CT cervical spine 1/23: IMPRESSION: No recent fracture is seen in the cervical spine. Alignment of posterior margins of vertebral bodies is unremarkable. Cervical spondylosis with mild encroachment of neural foramina at C4-C5 and C5-C6 levels. MRI 1/23: IMPRESSION: 1. Multifocal  acute/subacute nonhemorrhagic infarcts involving the left MCA territory. Findings are predominantly inferior and posteriorly. There is cortical infarction also involves the more anterior middle and inferior frontal lobes. 2. No acute hemorrhage.  PATIENT SURVEYS:  LEFS:31/80  08/25/23: 28/80   COGNITION: Overall cognitive status: Within functional limits for tasks assessed     SENSATION: Paresthesia through RLE   POSTURE: forward head and guarded/tight positioning  PALPATION: TTP about ASIS, SI area through mid thoracic area.  Muscle tightness throughout paraspinals  LUMBAR ROM:   AROM eval   Flexion 20% limited due to pain and muscle tightness   Extension full   Right lateral flexion 25%   Left lateral flexion 50%   Right rotation full   Left rotation full    (Blank rows = not tested)  LOWER EXTREMITY ROM:     WFL  LOWER EXTREMITY MMT:    MMT Right eval Left eval   Hip flexion  Norm 25-26 20.3 26.9   Hip extension     Hip abduction  Norm: 45-47 14.3 18.5   Hip adduction     Hip internal rotation     Hip external rotation     Knee flexion     Knee extension  Norm 70-73 15.0 27.2   Ankle dorsiflexion     Ankle plantarflexion     Ankle inversion     Ankle eversion      (Blank rows = not tested)  LUMBAR SPECIAL TESTS:  Slump test: Negative  FUNCTIONAL TESTS:  5 times sit to stand: 31.75 Timed up and go (TUG): 23.81 4 stage test: leading lle: passed 1,2,3.  SLS x 6 s  GAIT: Distance walked: 400 ft Assistive device utilized: None Level of assistance: Complete Independence Comments: Slowed cadence, shorted step length off loading slightly towards left, just slight heel strike with toe off.  TODAY'S TREATMENT:  Pt seen for aquatic therapy today.  Treatment took place in water 3.5-4.75 ft in depth at the [facility] pool.  Temp of water was 91.  Pt entered/exited the pool via stairs and step to pattern with hand rail.  *no UE support:  walking forward / backward 3 laps with reciprocal arm swing; side stepping R/L 3 laps with arm addct/ abdct  * UE on rainbow hand floats:  tricep push downs x 10; bilat abdct/ addct x 10;  marching with row motion x 2 laps * UE on wall: L stretch for shoulder flexion to RUE and low back stretch: tandem gait forward/ backward; hip abdct/ addct x 10; side to side lunge * UE on yellow noodle: tandem gait forward/ backward; Leg swings into hip flex/ext x 10 each; Heel raises x 10;  hip hinge x 10;   * TrA set with short noodle pull down to thighs x 10 *decompression noodle wrapped posteriorly across chest ue support corner of pool: cycling    Pt requires the buoyancy and hydrostatic pressure of water for support, and to offload joints by unweighting joint load by at least 50 % in navel deep water and by at least 75-80% in chest to neck deep water.  Viscosity of the water is needed for resistance of strengthening. Water current perturbations provides challenge to standing balance requiring increased core activation.   PATIENT EDUCATION:  Education details: aquatic therapy exercise progressions/ modifications  -- Issued list of area pools Person educated: Patient Education method: Programmer, multimedia, demo  Education comprehension: verbalized understanding  HOME EXERCISE PROGRAM: TBA  ASSESSMENT:  CLINICAL IMPRESSION: Pt able to walk in pool in all directions without UE support on noodle/hand floats demonstrating increased confidence in setting.  Pt reported limited change to R hip pain, but was able to complete increased volume of exercises without increase in pain.  LEFS score decreased, which is contradictory to her verbal report of improvement.  Will continue to progress as tolerated.   Pt has 2 remained approved visits.  Plan to check some of the LTGs next visit     Initial  impression Patient is a 52 y.o. f who was seen today for physical therapy evaluation and treatment for Sacrococcygeal disorder. She has been disable for the past 2 years due to CVA and cardiac issues.  She had a fall which preceded diagnosis injuring her LB. Residual weakness from cva right sided.  She presents with significant strength deficits bilaterally and with functional testing she is a high fall risk. She has muscle tightness throughout paraspinals which limit her thoracic and lumbar ROM.  She will benefit from skilled physical therpy to improve her functional mobility, Adl's and return her to an improved QOL. Plan on aquatic based initially then will transition to land based when deemed approp.  She reports reduction of pain in sacrum since recent injections.   OBJECTIVE IMPAIRMENTS: Abnormal gait, decreased activity tolerance, decreased balance, decreased endurance, decreased mobility, difficulty walking, decreased strength, postural dysfunction, and pain.   ACTIVITY LIMITATIONS: carrying, lifting, bending, sitting, standing, squatting, stairs, transfers, and locomotion level  PARTICIPATION LIMITATIONS: meal prep, cleaning, laundry, driving, shopping, community activity, occupation, and yard work  PERSONAL FACTORS: Age, Fitness, and 1-2 comorbidities: Past CVA, cardia dysfunction  are also affecting patient's functional outcome.   REHAB POTENTIAL: Good  CLINICAL DECISION MAKING: Evolving/moderate complexity  EVALUATION COMPLEXITY: Moderate   GOALS: Goals reviewed with patient? Yes  SHORT TERM GOALS: Target date: 08/25/23  Pt will tolerate full aquatic  sessions consistently without increase in pain and with improving function to demonstrate good toleration and effectiveness of intervention.  Baseline: Goal status:Met - 08/22/23  2.  Pt will tolerate walking to and from store 4 x week (10 minutes ea way) to demonstrated improved toleration to waking Baseline: has walked 1 time  to/from store.  Goal status: In progress -08/25/23  3.  Pt will report decrease in LBP and sacrum to 5/10 or less Baseline: 7/10 Goal status: MET - 08/22/23    LONG TERM GOALS: Target date: 09/17/23  Pt will improve on LEFS by 9 points 40/80 to demonstrate improved function Baseline: 31/80 on eval;  28/80 Goal status: In progress - 08/25/23  2.  Pt will complete SLS x 20s to demonstrate improved balance Baseline:  Goal status: INITIAL  3.  Pt will demonstrate full lumbar flex without increase in pain Baseline:  Goal status: INITIAL  4.  Pt will improve LE strength by at least 10lb to improve all functional mobility and decrease fall risk Baseline:  Goal status: INITIAL  5.  Pt will improve on 5 X STS test to <or=20s to demonstrate improving functional lower extremity strength, transitional movements, and balance Baseline:31.7  Goal status: INITIAL  6.  Pt will be indep with final HEP's (land and aquatic as appropriate) for continued management of condition Baseline: none Goal status: INITIAL  PLAN:  PT FREQUENCY: 1-2x/week  PT DURATION: 8 weeks 10 visits.  Extended time to allow for scheduling.  PLANNED INTERVENTIONS: Therapeutic exercises, Therapeutic activity, Neuromuscular re-education, Balance training, Gait training, Patient/Family education, Self Care, Joint mobilization, Joint manipulation, Stair training, Orthotic/Fit training, DME instructions, Aquatic Therapy, Dry Needling, Cryotherapy, Moist heat, scar mobilization, Taping, Ionotophoresis 4mg /ml Dexamethasone, Manual therapy, and Re-evaluation.  PLAN FOR NEXT SESSION: Aquatics: core and LE stretching; general strengthening; aerobic capacity training  Mayer Camel, PTA 08/25/23 9:22 AM [facility] [address] [place], [place], [address] Phone: [phone]   Fax:  [phone]

## 2023-08-31 ENCOUNTER — Ambulatory Visit (HOSPITAL_BASED_OUTPATIENT_CLINIC_OR_DEPARTMENT_OTHER): Payer: Medicaid Other | Admitting: Physical Therapy

## 2023-09-02 ENCOUNTER — Ambulatory Visit (HOSPITAL_BASED_OUTPATIENT_CLINIC_OR_DEPARTMENT_OTHER): Payer: Medicaid Other | Attending: Pain Medicine | Admitting: Physical Therapy

## 2023-09-02 ENCOUNTER — Other Ambulatory Visit: Payer: Self-pay | Admitting: Internal Medicine

## 2023-09-02 ENCOUNTER — Encounter: Payer: Self-pay | Admitting: Internal Medicine

## 2023-09-02 ENCOUNTER — Encounter (HOSPITAL_BASED_OUTPATIENT_CLINIC_OR_DEPARTMENT_OTHER): Payer: Self-pay | Admitting: Physical Therapy

## 2023-09-02 DIAGNOSIS — N6489 Other specified disorders of breast: Secondary | ICD-10-CM

## 2023-09-02 DIAGNOSIS — M6281 Muscle weakness (generalized): Secondary | ICD-10-CM | POA: Diagnosis present

## 2023-09-02 DIAGNOSIS — M25551 Pain in right hip: Secondary | ICD-10-CM | POA: Insufficient documentation

## 2023-09-02 DIAGNOSIS — M533 Sacrococcygeal disorders, not elsewhere classified: Secondary | ICD-10-CM | POA: Insufficient documentation

## 2023-09-02 DIAGNOSIS — R2681 Unsteadiness on feet: Secondary | ICD-10-CM | POA: Diagnosis present

## 2023-09-02 NOTE — Therapy (Signed)
OUTPATIENT PHYSICAL THERAPY THORACOLUMBAR TREATMENT   Patient Name: Megan Chaney MRN: [id number] DOB:1971-05-03, 52 y.o., female Today's Date: 09/02/2023  END OF SESSION:  PT End of Session - 09/02/23 1030     Visit Number 9    Number of Visits 10    Date for PT Re-Evaluation 09/19/23    Authorization Type [employer] mdcaid    PT Start Time 1031    PT Stop Time 1115    PT Time Calculation (min) 44 min    Activity Tolerance Patient tolerated treatment well    Behavior During Therapy WFL for tasks assessed/performed               Past Medical History:  Diagnosis Date   Anemia    Fibroid    GERD (gastroesophageal reflux disease)    Past Surgical History:  Procedure Laterality Date   BUBBLE STUDY  10/30/2021   Procedure: BUBBLE STUDY;  Surgeon: Wendall Stade, MD;  Location: [facility] ENDOSCOPY;  Service: Cardiovascular;;   LOOP RECORDER INSERTION N/A 10/30/2021   Procedure: LOOP RECORDER INSERTION;  Surgeon: Lanier Prude, MD;  Location: [facility] INVASIVE CV LAB;  Service: Cardiovascular;  Laterality: N/A;   MULTIPLE TOOTH EXTRACTIONS     NO PAST SURGERIES     TEE WITHOUT CARDIOVERSION N/A 10/30/2021   Procedure: TRANSESOPHAGEAL ECHOCARDIOGRAM (TEE);  Surgeon: Wendall Stade, MD;  Location: [facility] ENDOSCOPY;  Service: Cardiovascular;  Laterality: N/A;   Patient Active Problem List   Diagnosis Date Noted   Acute ischemic stroke (HCC) 10/26/2021   Infertility counseling 10/04/2019   Enlarged uterus 07/31/2018   Uterine leiomyoma 07/31/2018   DUB (dysfunctional uterine bleeding) 07/31/2018    PCP: Melida Quitter, MD   REFERRING PROVIDER: Maximiano Coss, MD   REFERRING DIAG: Sacrococcygeal disorders, not elsewhere classified   Rationale for Evaluation and Treatment: Rehabilitation  THERAPY DIAG:  Sacrococcygeal disorders, not elsewhere classified  Pain in right hip  Muscle weakness (generalized)  Unsteadiness on feet  ONSET DATE: Jan 2023  SUBJECTIVE:                                                                                                                                                                                            SUBJECTIVE STATEMENT: Pt reports that she is better but she continue to have a sharp pain down right leg>left and across low back which "fizzles" in 2-3 minutes after walking sometimes    Initial evaluation:  Jan 2023 fell x 2 hurt right side. Had a stroke went to hospital and they found other things wrong, had a loop procedure before I left.  Had a catheterization  inMay.  I have been getting more energy as the year goes on. Anemia interfered and was unable to do therapy for a few years.  Has had DN for back earlier this year, it did help.  Has steroid shots in shoulder and hip. Sacroiliac injections ~1-2 weeks with pain relief from 10/10 now 7/10.   PERTINENT HISTORY:  10/27/21 Multifocal acute/subacute nonhemorrhagic infarcts involving the left MCA territory.   PAIN:  Are you having pain? Yes: NPRS scale: 5-6/10 Pain location: Rt hip,  Pain description: tender Aggravating factors: weather, undefined Relieving factors: lying down, reposition, meds, recliner,sometimes walk  PRECAUTIONS: Fall  RED FLAGS: Bowel or bladder incontinence: Yes: but improved    WEIGHT BEARING RESTRICTIONS: no  FALLS:  Has patient fallen in last 6 months? No  LIVING ENVIRONMENT: Lives with: lives with their family Lives in: House/apartment Stairs:  outdoor 3 step handrail Has following equipment at home: None  OCCUPATION: disabled  PLOF: Independent  PATIENT GOALS: decrease/ manage pain, do daily activities like walking 10 mins  NEXT MD VISIT: next week  OBJECTIVE:   DIAGNOSTIC FINDINGS:  CT cervical spine 1/23: IMPRESSION: No recent fracture is seen in the cervical spine. Alignment of posterior margins of vertebral bodies is unremarkable. Cervical spondylosis with mild encroachment of neural foramina at C4-C5  and C5-C6 levels. MRI 1/23: IMPRESSION: 1. Multifocal acute/subacute nonhemorrhagic infarcts involving the left MCA territory. Findings are predominantly inferior and posteriorly. There is cortical infarction also involves the more anterior middle and inferior frontal lobes. 2. No acute hemorrhage.  PATIENT SURVEYS:  LEFS:31/80  08/25/23: 28/80   COGNITION: Overall cognitive status: Within functional limits for tasks assessed     SENSATION: Paresthesia through RLE   POSTURE: forward head and guarded/tight positioning  PALPATION: TTP about ASIS, SI area through mid thoracic area.  Muscle tightness throughout paraspinals  LUMBAR ROM:   AROM eval   Flexion 20% limited due to pain and muscle tightness   Extension full   Right lateral flexion 25%   Left lateral flexion 50%   Right rotation full   Left rotation full    (Blank rows = not tested)  LOWER EXTREMITY ROM:     WFL  LOWER EXTREMITY MMT:    MMT Right eval Left eval   Hip flexion  Norm 25-26 20.3 26.9   Hip extension     Hip abduction  Norm: 45-47 14.3 18.5   Hip adduction     Hip internal rotation     Hip external rotation     Knee flexion     Knee extension  Norm 70-73 15.0 27.2   Ankle dorsiflexion     Ankle plantarflexion     Ankle inversion     Ankle eversion      (Blank rows = not tested)  LUMBAR SPECIAL TESTS:  Slump test: Negative  FUNCTIONAL TESTS:  5 times sit to stand: 31.75 Timed up and go (TUG): 23.81 4 stage test: leading lle: passed 1,2,3.  SLS x 6 s  GAIT: Distance walked: 400 ft Assistive device utilized: None Level of assistance: Complete Independence Comments: Slowed cadence, shorted step length off loading slightly towards left, just slight heel strike with toe off.  TODAY'S TREATMENT:  Pt seen for aquatic therapy today.  Treatment took place in  water 3.5-4.75 ft in depth at the [facility] pool. Temp of water was 91.  Pt entered/exited the pool via stairs and step to pattern with hand rail.  *no UE support:  walking forward / backward 3 laps with reciprocal arm swing; side stepping R/L 3 laps with arm addct/ abdct  *HB carry forward and back x 2 widths ea *Side lunges with ue add/abd rainbow HB x 2 widths * TrA set with short noodle pull down to thighs x 10 wide stance then staggered stance *decompression noodle wrapped posteriorly across chest ue support corner of pool: cycling; hip add/abd; skiing * UE on wall: L stretch for shoulder flexion to RUE and low back stretch * UE on rainbow hand floats:  tricep push downs x 12; horizontal add/abd x 12 * UE on yellow noodle: Leg swings into hip flex/ext x 10 each; Heel raises x 10;  hip hinge x 10;    Pt requires the buoyancy and hydrostatic pressure of water for support, and to offload joints by unweighting joint load by at least 50 % in navel deep water and by at least 75-80% in chest to neck deep water.  Viscosity of the water is needed for resistance of strengthening. Water current perturbations provides challenge to standing balance requiring increased core activation.   PATIENT EDUCATION:  Education details: aquatic therapy exercise progressions/ modifications  -- Issued list of area pools Person educated: Patient Education method: Programmer, multimedia, demo  Education comprehension: verbalized understanding  HOME EXERCISE PROGRAM: TBA  ASSESSMENT:  CLINICAL IMPRESSION: LTG addressed.  Pt making good progress. Plan to re-test next/10 visit.  Requested additional medicaid approval.  Pt with improved gsit pattern, decreased guarding, improved cadence with symmetrical pattern submerged as well as on land.  She tolerates progression of core engagement exercises well. Pain reduced by 2-3 NPRS by end of treatment. Goals ongoing      Initial impression Patient is a 52 y.o. f who  was seen today for physical therapy evaluation and treatment for Sacrococcygeal disorder. She has been disable for the past 2 years due to CVA and cardiac issues.  She had a fall which preceded diagnosis injuring her LB. Residual weakness from cva right sided.  She presents with significant strength deficits bilaterally and with functional testing she is a high fall risk. She has muscle tightness throughout paraspinals which limit her thoracic and lumbar ROM.  She will benefit from skilled physical therpy to improve her functional mobility, Adl's and return her to an improved QOL. Plan on aquatic based initially then will transition to land based when deemed approp.  She reports reduction of pain in sacrum since recent injections.   OBJECTIVE IMPAIRMENTS: Abnormal gait, decreased activity tolerance, decreased balance, decreased endurance, decreased mobility, difficulty walking, decreased strength, postural dysfunction, and pain.   ACTIVITY LIMITATIONS: carrying, lifting, bending, sitting, standing, squatting, stairs, transfers, and locomotion level  PARTICIPATION LIMITATIONS: meal prep, cleaning, laundry, driving, shopping, community activity, occupation, and yard work  PERSONAL FACTORS: Age, Fitness, and 1-2 comorbidities: Past CVA, cardia dysfunction  are also affecting patient's functional outcome.   REHAB POTENTIAL: Good  CLINICAL DECISION MAKING: Evolving/moderate complexity  EVALUATION COMPLEXITY: Moderate   GOALS: Goals reviewed with patient? Yes  SHORT TERM GOALS: Target date: 08/25/23  Pt will tolerate full aquatic sessions consistently without increase in pain and with improving function to demonstrate good toleration and effectiveness of intervention.  Baseline: Goal status:Met - 08/22/23  2.  Pt will tolerate walking to and from store 4 x week (10 minutes ea way) to demonstrated improved toleration to waking Baseline: has walked 1 time to/from store.  Goal status: In progress  -08/25/23; 1-2 times a week 09/02/23  3.  Pt will report decrease in LBP and sacrum to 5/10 or less Baseline: 7/10 Goal status: MET - 08/22/23    LONG TERM GOALS: Target date: 09/17/23  Pt will improve on LEFS by 9 points 40/80 to demonstrate improved function Baseline: 31/80 on eval;  28/80 Goal status: In progress - 08/25/23  2.  Pt will complete SLS x 20s to demonstrate improved balance Baseline:  Goal status: INITIAL  3.  Pt will demonstrate full lumbar flex without increase in pain Baseline:  Goal status: INITIAL  4.  Pt will improve LE strength by at least 10lb to improve all functional mobility and decrease fall risk Baseline:  Goal status: INITIAL  5.  Pt will improve on 5 X STS test to <or=20s to demonstrate improving functional lower extremity strength, transitional movements, and balance Baseline:31.7  Goal status: INITIAL  6.  Pt will be indep with final HEP's (land and aquatic as appropriate) for continued management of condition Baseline: none Goal status: INITIAL  PLAN:  PT FREQUENCY: 1-2x/week  PT DURATION: 8 weeks 10 visits.  Extended time to allow for scheduling.  PLANNED INTERVENTIONS: Therapeutic exercises, Therapeutic activity, Neuromuscular re-education, Balance training, Gait training, Patient/Family education, Self Care, Joint mobilization, Joint manipulation, Stair training, Orthotic/Fit training, DME instructions, Aquatic Therapy, Dry Needling, Cryotherapy, Moist heat, scar mobilization, Taping, Ionotophoresis 4mg /ml Dexamethasone, Manual therapy, and Re-evaluation.  PLAN FOR NEXT SESSION: Aquatics: core and LE stretching; general strengthening; aerobic capacity training  Corrie Dandy Tomma Lightning) Kao Berkheimer MPT 09/02/23 12:32 PM [facility] [address] [place], [place], [address] Phone: [phone]   Fax:  [phone]

## 2023-09-07 ENCOUNTER — Encounter (HOSPITAL_BASED_OUTPATIENT_CLINIC_OR_DEPARTMENT_OTHER): Payer: Self-pay | Admitting: Physical Therapy

## 2023-09-07 ENCOUNTER — Ambulatory Visit (HOSPITAL_BASED_OUTPATIENT_CLINIC_OR_DEPARTMENT_OTHER): Payer: Medicaid Other | Admitting: Physical Therapy

## 2023-09-07 DIAGNOSIS — M533 Sacrococcygeal disorders, not elsewhere classified: Secondary | ICD-10-CM

## 2023-09-07 DIAGNOSIS — M6281 Muscle weakness (generalized): Secondary | ICD-10-CM

## 2023-09-07 DIAGNOSIS — M25551 Pain in right hip: Secondary | ICD-10-CM

## 2023-09-07 NOTE — Therapy (Signed)
OUTPATIENT PHYSICAL THERAPY THORACOLUMBAR TREATMENT Progress Note/ re-cert Reporting Period 07/25/23 to 09/07/23  See note below for Objective Data and Assessment of Progress/Goals.      Patient Name: Megan Chaney MRN: [id number] DOB:10-Nov-1970, 52 y.o., female Today's Date: 09/07/2023  END OF SESSION:  PT End of Session - 09/07/23 1107     Visit Number 10    Number of Visits 18    Date for PT Re-Evaluation 11/04/23    Authorization Type [employer] mdcaid    PT Start Time 1031    PT Stop Time 1115    PT Time Calculation (min) 44 min    Activity Tolerance Patient tolerated treatment well    Behavior During Therapy WFL for tasks assessed/performed                Past Medical History:  Diagnosis Date   Anemia    Fibroid    GERD (gastroesophageal reflux disease)    Past Surgical History:  Procedure Laterality Date   BUBBLE STUDY  10/30/2021   Procedure: BUBBLE STUDY;  Surgeon: Wendall Stade, MD;  Location: [facility] ENDOSCOPY;  Service: Cardiovascular;;   LOOP RECORDER INSERTION N/A 10/30/2021   Procedure: LOOP RECORDER INSERTION;  Surgeon: Lanier Prude, MD;  Location: [facility] INVASIVE CV LAB;  Service: Cardiovascular;  Laterality: N/A;   MULTIPLE TOOTH EXTRACTIONS     NO PAST SURGERIES     TEE WITHOUT CARDIOVERSION N/A 10/30/2021   Procedure: TRANSESOPHAGEAL ECHOCARDIOGRAM (TEE);  Surgeon: Wendall Stade, MD;  Location: [facility] ENDOSCOPY;  Service: Cardiovascular;  Laterality: N/A;   Patient Active Problem List   Diagnosis Date Noted   Acute ischemic stroke (HCC) 10/26/2021   Infertility counseling 10/04/2019   Enlarged uterus 07/31/2018   Uterine leiomyoma 07/31/2018   DUB (dysfunctional uterine bleeding) 07/31/2018    PCP: Melida Quitter, MD   REFERRING PROVIDER: Maximiano Coss, MD   REFERRING DIAG: Sacrococcygeal disorders, not elsewhere classified   Rationale for Evaluation and Treatment: Rehabilitation  THERAPY DIAG:  Sacrococcygeal disorders, not  elsewhere classified  Pain in right hip  Muscle weakness (generalized)  ONSET DATE: Jan 2023  SUBJECTIVE:                                                                                                                                                                                           SUBJECTIVE STATEMENT: Pt reports some increase in pain due to the weather changing to cold    Initial evaluation:  Jan 2023 fell x 2 hurt right side. Had a stroke went to hospital and they found other things wrong, had a loop procedure before  I left.  Had a catheterization  inMay.  I have been getting more energy as the year goes on. Anemia interfered and was unable to do therapy for a few years.  Has had DN for back earlier this year, it did help.  Has steroid shots in shoulder and hip. Sacroiliac injections ~1-2 weeks with pain relief from 10/10 now 7/10.   PERTINENT HISTORY:  10/27/21 Multifocal acute/subacute nonhemorrhagic infarcts involving the left MCA territory.   PAIN:  Are you having pain? Yes: NPRS scale: 4/10 Pain location: Rt hip, right shoulder Pain description: tender Aggravating factors: weather, undefined Relieving factors: lying down, reposition, meds, recliner,sometimes walk  PRECAUTIONS: Fall  RED FLAGS: Bowel or bladder incontinence: Yes: but improved    WEIGHT BEARING RESTRICTIONS: no  FALLS:  Has patient fallen in last 6 months? No  LIVING ENVIRONMENT: Lives with: lives with their family Lives in: House/apartment Stairs:  outdoor 3 step handrail Has following equipment at home: None  OCCUPATION: disabled  PLOF: Independent  PATIENT GOALS: decrease/ manage pain, do daily activities like walking 10 mins  NEXT MD VISIT: next week  OBJECTIVE:   DIAGNOSTIC FINDINGS:  CT cervical spine 1/23: IMPRESSION: No recent fracture is seen in the cervical spine. Alignment of posterior margins of vertebral bodies is unremarkable. Cervical spondylosis with mild  encroachment of neural foramina at C4-C5 and C5-C6 levels. MRI 1/23: IMPRESSION: 1. Multifocal acute/subacute nonhemorrhagic infarcts involving the left MCA territory. Findings are predominantly inferior and posteriorly. There is cortical infarction also involves the more anterior middle and inferior frontal lobes. 2. No acute hemorrhage.  PATIENT SURVEYS:  LEFS:31/80  08/25/23: 28/80   COGNITION: Overall cognitive status: Within functional limits for tasks assessed     SENSATION: Paresthesia through RLE   POSTURE: forward head and guarded/tight positioning  PALPATION: TTP about ASIS, SI area through mid thoracic area.  Muscle tightness throughout paraspinals  LUMBAR ROM:   AROM eval 09/07/23  Flexion 20% limited due to pain and muscle tightness 50% limited P!  Extension full   Right lateral flexion 25% 25%  Left lateral flexion 50% 50%  Right rotation full   Left rotation full    (Blank rows = not tested)  LOWER EXTREMITY ROM:     WFL  LOWER EXTREMITY MMT:    MMT Right eval Left eval R / L 09/07/23  Hip flexion  Norm 25-26 20.3 26.9 21.1 / 33.6  Hip extension     Hip abduction  Norm: 45-47 14.3 18.5 17.6 / 26.4  Hip adduction     Hip internal rotation     Hip external rotation     Knee flexion     Knee extension  Norm 70-73 15.0 27.2 28.1  / 41.1  Ankle dorsiflexion     Ankle plantarflexion     Ankle inversion     Ankle eversion      (Blank rows = not tested)  LUMBAR SPECIAL TESTS:  Slump test: Negative  FUNCTIONAL TESTS:  5 times sit to stand: 31.75 Timed up and go (TUG): 23.81 4 stage test: leading lle: passed 1,2,3.  SLS x 6 s  09/07/23 TUG: 18.29s 4 Stage: SLS: 10s  GAIT: Distance walked: 400 ft Assistive device utilized: None Level of assistance: Complete Independence Comments: Slowed cadence, shorted step length off loading slightly towards left, just slight heel strike with toe off.  TODAY'S TREATMENT:  Pt seen for aquatic therapy today.  Treatment took place in water 3.5-4.75 ft in depth at the [facility] pool. Temp of water was 91.  Pt entered/exited the pool via stairs and step to pattern with hand rail.  *no UE support:  walking forward / backward 3 laps with reciprocal arm swing; side stepping R/L 3 laps with arm addct/ abdct  *HB carry forward and back and side stepping x 2 widths ea *Side lunges with ue add/abd rainbow HB x 2 widths *hip hiking right and Left x 8-10. Not tolerated well right * TrA set with short -> full hollow noodle pull down to thighs x 10 wide stance then staggered stance * UE on rainbow hand floats:  tricep push downs x 12; horizontal add/abd x 12 *decompression noodle wrapped posteriorly across chest ue support corner of pool: cycling; hip add/abd; skiing   Pt requires the buoyancy and hydrostatic pressure of water for support, and to offload joints by unweighting joint load by at least 50 % in navel deep water and by at least 75-80% in chest to neck deep water.  Viscosity of the water is needed for resistance of strengthening. Water current perturbations provides challenge to standing balance requiring increased core activation.  PN testing  PATIENT EDUCATION:  Education details: aquatic therapy exercise progressions/ modifications  -- Issued list of area pools Person educated: Patient Education method: Programmer, multimedia, demo  Education comprehension: verbalized understanding  HOME EXERCISE PROGRAM: TBA  ASSESSMENT:  CLINICAL IMPRESSION: Right hip hiking recreates right LE pain into foot, ceases with discontinuing of exercise. Able to progress core strengthening load using full hollow noodle with pull down which is a good challenge. PN: pt demonstrates improvement with all function and objective testing as in charts above. She will continue to benefit  from skilled physical therapy intervention pending approval from ins.  Plan to begin transitioning onto land based intervention as she will be able to tolerate increased loads for strengthening going forward. Last session LTG addressed.  Pt making good progress. Plan to re-test next/10 visit.  Requested additional medicaid approval.  Pt with improved gsit pattern, decreased guarding, improved cadence with symmetrical pattern submerged as well as on land.  She tolerates progression of core engagement exercises well. Pain reduced by 2-3 NPRS by end of treatment. Goals ongoing      Initial impression Patient is a 52 y.o. f who was seen today for physical therapy evaluation and treatment for Sacrococcygeal disorder. She has been disable for the past 2 years due to CVA and cardiac issues.  She had a fall which preceded diagnosis injuring her LB. Residual weakness from cva right sided.  She presents with significant strength deficits bilaterally and with functional testing she is a high fall risk. She has muscle tightness throughout paraspinals which limit her thoracic and lumbar ROM.  She will benefit from skilled physical therpy to improve her functional mobility, Adl's and return her to an improved QOL. Plan on aquatic based initially then will transition to land based when deemed approp.  She reports reduction of pain in sacrum since recent injections.   OBJECTIVE IMPAIRMENTS: Abnormal gait, decreased activity tolerance, decreased balance, decreased endurance, decreased mobility, difficulty walking, decreased strength, postural dysfunction, and pain.   ACTIVITY LIMITATIONS: carrying, lifting, bending, sitting, standing, squatting, stairs, transfers, and locomotion level  PARTICIPATION LIMITATIONS: meal prep, cleaning, laundry, driving, shopping, community activity, occupation, and yard work  PERSONAL FACTORS: Age, Fitness, and 1-2 comorbidities: Past CVA, cardia dysfunction  are also  affecting  patient's functional outcome.   REHAB POTENTIAL: Good  CLINICAL DECISION MAKING: Evolving/moderate complexity  EVALUATION COMPLEXITY: Moderate   GOALS: Goals reviewed with patient? Yes  SHORT TERM GOALS: Target date: 08/25/23  Pt will tolerate full aquatic sessions consistently without increase in pain and with improving function to demonstrate good toleration and effectiveness of intervention.  Baseline: Goal status:Met - 08/22/23  2.  Pt will tolerate walking to and from store 4 x week (10 minutes ea way) to demonstrated improved toleration to waking Baseline: has walked 1 time to/from store.  Goal status: In progress -08/25/23; 1-2 times a week 09/02/23  3.  Pt will report decrease in LBP and sacrum to 5/10 or less Baseline: 7/10 Goal status: MET - 08/22/23    LONG TERM GOALS: Target date: 09/17/23  Pt will improve on LEFS by 9 points 40/80 to demonstrate improved function Baseline: 31/80 on eval;  28/80 Goal status: In progress - 08/25/23  2.  Pt will complete SLS x 20s to demonstrate improved balance Baseline: see chart Goal status: In Progress 09/07/23 (10s)  3.  Pt will demonstrate full lumbar flex without increase in pain Baseline:  Goal status: INITIAL  4.  Pt will improve LE strength by at least 10lb to improve all functional mobility and decrease fall risk Baseline:  Goal status: INITIAL  5.  Pt will improve on 5 X STS test to <or=20s to demonstrate improving functional lower extremity strength, transitional movements, and balance Baseline:31.7  Goal status: INITIAL  6.  Pt will be indep with final HEP's (land and aquatic as appropriate) for continued management of condition Baseline: none Goal status: INITIAL  PLAN:  PT FREQUENCY: 1 x week  PT DURATION: 8 weeks   PLANNED INTERVENTIONS: Therapeutic exercises, Therapeutic activity, Neuromuscular re-education, Balance training, Gait training, Patient/Family education, Self Care, Joint mobilization,  Joint manipulation, Stair training, Orthotic/Fit training, DME instructions, Aquatic Therapy, Dry Needling, Cryotherapy, Moist heat, scar mobilization, Taping, Ionotophoresis 4mg /ml Dexamethasone, Manual therapy, and Re-evaluation.  PLAN FOR NEXT SESSION: Aquatics: core and LE stretching; general strengthening; aerobic capacity training  Rushie Chestnut) Baby Gieger MPT 09/07/23 11:35 AM [facility] [address] [place], [place], [address] Phone: [phone]   Fax:  [phone]

## 2023-09-12 ENCOUNTER — Ambulatory Visit (INDEPENDENT_AMBULATORY_CARE_PROVIDER_SITE_OTHER): Payer: Medicaid Other

## 2023-09-12 DIAGNOSIS — I639 Cerebral infarction, unspecified: Secondary | ICD-10-CM | POA: Diagnosis not present

## 2023-09-12 LAB — CUP PACEART REMOTE DEVICE CHECK
Date Time Interrogation Session: 20241117231852
Implantable Pulse Generator Implant Date: 20230106

## 2023-09-20 ENCOUNTER — Ambulatory Visit (HOSPITAL_BASED_OUTPATIENT_CLINIC_OR_DEPARTMENT_OTHER): Payer: Medicaid Other | Admitting: Physical Therapy

## 2023-09-20 ENCOUNTER — Encounter (HOSPITAL_BASED_OUTPATIENT_CLINIC_OR_DEPARTMENT_OTHER): Payer: Self-pay | Admitting: Physical Therapy

## 2023-09-20 DIAGNOSIS — M6281 Muscle weakness (generalized): Secondary | ICD-10-CM

## 2023-09-20 DIAGNOSIS — M25551 Pain in right hip: Secondary | ICD-10-CM

## 2023-09-20 DIAGNOSIS — M533 Sacrococcygeal disorders, not elsewhere classified: Secondary | ICD-10-CM

## 2023-09-20 DIAGNOSIS — R2681 Unsteadiness on feet: Secondary | ICD-10-CM

## 2023-09-20 NOTE — Therapy (Signed)
OUTPATIENT PHYSICAL THERAPY THORACOLUMBAR TREATMENT  Patient Name: Megan Chaney MRN: [id number] DOB:06-Dec-1970, 52 y.o., female Today's Date: 09/20/2023  END OF SESSION:  PT End of Session - 09/20/23 0914     Visit Number 11    Number of Visits 18    Date for PT Re-Evaluation 11/04/23    Authorization Type [employer] [email or web address]    Authorization Time Period 4 visits - 09/07/23-10/06/23    Authorization - Visit Number 2    Authorization - Number of Visits 4    PT Start Time 0902    PT Stop Time 0944    PT Time Calculation (min) 42 min                Past Medical History:  Diagnosis Date   Anemia    Fibroid    GERD (gastroesophageal reflux disease)    Past Surgical History:  Procedure Laterality Date   BUBBLE STUDY  10/30/2021   Procedure: BUBBLE STUDY;  Surgeon: Wendall Stade, MD;  Location: [facility] ENDOSCOPY;  Service: Cardiovascular;;   LOOP RECORDER INSERTION N/A 10/30/2021   Procedure: LOOP RECORDER INSERTION;  Surgeon: Lanier Prude, MD;  Location: [facility] INVASIVE CV LAB;  Service: Cardiovascular;  Laterality: N/A;   MULTIPLE TOOTH EXTRACTIONS     NO PAST SURGERIES     TEE WITHOUT CARDIOVERSION N/A 10/30/2021   Procedure: TRANSESOPHAGEAL ECHOCARDIOGRAM (TEE);  Surgeon: Wendall Stade, MD;  Location: [facility] ENDOSCOPY;  Service: Cardiovascular;  Laterality: N/A;   Patient Active Problem List   Diagnosis Date Noted   Acute ischemic stroke (HCC) 10/26/2021   Infertility counseling 10/04/2019   Enlarged uterus 07/31/2018   Uterine leiomyoma 07/31/2018   DUB (dysfunctional uterine bleeding) 07/31/2018    PCP: Melida Quitter, MD   REFERRING PROVIDER: Maximiano Coss, MD   REFERRING DIAG: Sacrococcygeal disorders, not elsewhere classified   Rationale for Evaluation and Treatment: Rehabilitation  THERAPY DIAG:  Sacrococcygeal disorders, not elsewhere classified  Pain in right hip  Muscle weakness (generalized)  Unsteadiness on feet  ONSET DATE: Jan  2023  SUBJECTIVE:                                                                                                                                                                                           SUBJECTIVE STATEMENT: Pt reports some increase in pain due to the weather (rain).      Initial evaluation:  Jan 2023 fell x 2 hurt right side. Had a stroke went to hospital and they found other things wrong, had a loop procedure before I left.  Had a catheterization  inMay.  I have  been getting more energy as the year goes on. Anemia interfered and was unable to do therapy for a few years.  Has had DN for back earlier this year, it did help.  Has steroid shots in shoulder and hip. Sacroiliac injections ~1-2 weeks with pain relief from 10/10 now 7/10.   PERTINENT HISTORY:  10/27/21 Multifocal acute/subacute nonhemorrhagic infarcts involving the left MCA territory.   PAIN:  Are you having pain? Yes: NPRS scale: 5/10 Pain location: Rt hip, right shoulder, low back Pain description: tender Aggravating factors: weather, undefined Relieving factors: lying down, reposition, meds, recliner,sometimes walk  PRECAUTIONS: Fall  RED FLAGS: Bowel or bladder incontinence: Yes: but improved    WEIGHT BEARING RESTRICTIONS: no  FALLS:  Has patient fallen in last 6 months? No  LIVING ENVIRONMENT: Lives with: lives with their family Lives in: House/apartment Stairs:  outdoor 3 step handrail Has following equipment at home: None  OCCUPATION: disabled  PLOF: Independent  PATIENT GOALS: decrease/ manage pain, do daily activities like walking 10 mins  NEXT MD VISIT:   OBJECTIVE:   DIAGNOSTIC FINDINGS:  CT cervical spine 1/23: IMPRESSION: No recent fracture is seen in the cervical spine. Alignment of posterior margins of vertebral bodies is unremarkable. Cervical spondylosis with mild encroachment of neural foramina at C4-C5 and C5-C6 levels. MRI 1/23: IMPRESSION: 1. Multifocal  acute/subacute nonhemorrhagic infarcts involving the left MCA territory. Findings are predominantly inferior and posteriorly. There is cortical infarction also involves the more anterior middle and inferior frontal lobes. 2. No acute hemorrhage.  PATIENT SURVEYS:  LEFS:31/80  08/25/23: 28/80   COGNITION: Overall cognitive status: Within functional limits for tasks assessed     SENSATION: Paresthesia through RLE   POSTURE: forward head and guarded/tight positioning  PALPATION: TTP about ASIS, SI area through mid thoracic area.  Muscle tightness throughout paraspinals  LUMBAR ROM:   AROM eval 09/07/23  Flexion 20% limited due to pain and muscle tightness 50% limited P!  Extension full   Right lateral flexion 25% 25%  Left lateral flexion 50% 50%  Right rotation full   Left rotation full    (Blank rows = not tested)  LOWER EXTREMITY ROM:     WFL  LOWER EXTREMITY MMT:    MMT Right eval Left eval R / L 09/07/23  Hip flexion  Norm 25-26 20.3 26.9 21.1 / 33.6  Hip extension     Hip abduction  Norm: 45-47 14.3 18.5 17.6 / 26.4  Hip adduction     Hip internal rotation     Hip external rotation     Knee flexion     Knee extension  Norm 70-73 15.0 27.2 28.1  / 41.1  Ankle dorsiflexion     Ankle plantarflexion     Ankle inversion     Ankle eversion      (Blank rows = not tested)  LUMBAR SPECIAL TESTS:  Slump test: Negative  FUNCTIONAL TESTS:  5 times sit to stand: 31.75 Timed up and go (TUG): 23.81 4 stage test: leading lle: passed 1,2,3.  SLS x 6 s  09/07/23 TUG: 18.29s 4 Stage: SLS: 10s  GAIT: Distance walked: 400 ft Assistive device utilized: None Level of assistance: Complete Independence Comments: Slowed cadence, shorted step length off loading slightly towards left, just slight heel strike with toe off.  TODAY'S TREATMENT:  Pt seen for aquatic therapy today.  Treatment took place in water 3.5-4.75 ft in depth at the [facility] pool. Temp of water was 91.  Pt entered/exited the pool via stairs and step to pattern with hand rail.  *no UE support:  walking forward / backward 2 laps with reciprocal arm swing; side stepping R/L 3 laps with UE on noodle *decompression noodle wrapped posteriorly across chest ue support corner of pool: cycling; hip add/abd; skiing- 3 reps * SLS each LE up to 20s each * tandem gait forward / backward without UE support * UE on wall:  Hip abdct/addct crossing midline x 8 each (Limited tolerance on RLE) * UE on rainbow hand floats:  tricep press downs x 10 * wall push up/off x 12 * high knee marching with row motion with UE on rainbow hand floats * Rt forward step ups (Lt retro step down) x 10 with cues for form  Pt requires the buoyancy and hydrostatic pressure of water for support, and to offload joints by unweighting joint load by at least 50 % in navel deep water and by at least 75-80% in chest to neck deep water.  Viscosity of the water is needed for resistance of strengthening. Water current perturbations provides challenge to standing balance requiring increased core activation.   PATIENT EDUCATION:  Education details: aquatic therapy exercise progressions/ modifications  Person educated: Patient Education method: Programmer, multimedia, demo  Education comprehension: verbalized understanding  HOME EXERCISE PROGRAM: TBA  ASSESSMENT:  CLINICAL IMPRESSION: .Pt reported no change in pain level in back and RLE during session. SLS in water improved time to 20s. Pt participated well throughout and remains motivated to progress towards established goals.  She will continue to benefit from skilled physical therapy intervention pending approval from ins.  Plan to begin transitioning onto land based intervention as she will be able to tolerate increased loads for strengthening going  forward.  Pt making good progress towards remaining goals. Plan to re-test next/10 visit.        Initial impression Patient is a 52 y.o. f who was seen today for physical therapy evaluation and treatment for Sacrococcygeal disorder. She has been disable for the past 2 years due to CVA and cardiac issues.  She had a fall which preceded diagnosis injuring her LB. Residual weakness from cva right sided.  She presents with significant strength deficits bilaterally and with functional testing she is a high fall risk. She has muscle tightness throughout paraspinals which limit her thoracic and lumbar ROM.  She will benefit from skilled physical therpy to improve her functional mobility, Adl's and return her to an improved QOL. Plan on aquatic based initially then will transition to land based when deemed approp.  She reports reduction of pain in sacrum since recent injections.   OBJECTIVE IMPAIRMENTS: Abnormal gait, decreased activity tolerance, decreased balance, decreased endurance, decreased mobility, difficulty walking, decreased strength, postural dysfunction, and pain.   ACTIVITY LIMITATIONS: carrying, lifting, bending, sitting, standing, squatting, stairs, transfers, and locomotion level  PARTICIPATION LIMITATIONS: meal prep, cleaning, laundry, driving, shopping, community activity, occupation, and yard work  PERSONAL FACTORS: Age, Fitness, and 1-2 comorbidities: Past CVA, cardia dysfunction  are also affecting patient's functional outcome.   REHAB POTENTIAL: Good  CLINICAL DECISION MAKING: Evolving/moderate complexity  EVALUATION COMPLEXITY: Moderate   GOALS: Goals reviewed with patient? Yes  SHORT TERM GOALS: Target date: 08/25/23  Pt will tolerate full aquatic sessions consistently without increase in pain and with improving function to demonstrate good toleration  and effectiveness of intervention.  Baseline: Goal status:Met - 08/22/23  2.  Pt will tolerate walking to and from  store 4 x week (10 minutes ea way) to demonstrated improved toleration to waking Baseline: has walked 1 time to/from store.  Goal status: In progress - 1-2 times a week 09/02/23  3.  Pt will report decrease in LBP and sacrum to 5/10 or less Baseline: 7/10 Goal status: MET - 08/22/23    LONG TERM GOALS: Target date: 09/17/23  Pt will improve on LEFS by 9 points 40/80 to demonstrate improved function Baseline: 31/80 on eval;  28/80 Goal status: In progress - 08/25/23  2.  Pt will complete SLS x 20s to demonstrate improved balance Baseline: see chart Goal status: In Progress 09/07/23 (10s)  3.  Pt will demonstrate full lumbar flex without increase in pain Baseline: see chart above Goal status:In progress 09/07/23  4.  Pt will improve LE strength by at least 10lb to improve all functional mobility and decrease fall risk Baseline: see above  Goal status: In Progress 09/07/23  5.  Pt will improve on 5 X STS test to <or=20s to demonstrate improving functional lower extremity strength, transitional movements, and balance Baseline:31.7  Goal status: INITIAL  6.  Pt will be indep with final HEP's (land and aquatic as appropriate) for continued management of condition Baseline: none Goal status: INITIAL  PLAN:  PT FREQUENCY: 1 x week  PT DURATION: 8 weeks   PLANNED INTERVENTIONS: Therapeutic exercises, Therapeutic activity, Neuromuscular re-education, Balance training, Gait training, Patient/Family education, Self Care, Joint mobilization, Joint manipulation, Stair training, Orthotic/Fit training, DME instructions, Aquatic Therapy, Dry Needling, Cryotherapy, Moist heat, scar mobilization, Taping, Ionotophoresis 4mg /ml Dexamethasone, Manual therapy, and Re-evaluation.  PLAN FOR NEXT SESSION: Aquatics: core and LE stretching; general strengthening; aerobic capacity training  Mayer Camel, PTA 09/20/23 4:10 PM [facility] [address] [place], [place], [address] Phone: [phone]   Fax:  [phone]

## 2023-09-27 ENCOUNTER — Ambulatory Visit (HOSPITAL_BASED_OUTPATIENT_CLINIC_OR_DEPARTMENT_OTHER): Payer: Medicaid Other | Admitting: Physical Therapy

## 2023-09-28 ENCOUNTER — Ambulatory Visit (HOSPITAL_BASED_OUTPATIENT_CLINIC_OR_DEPARTMENT_OTHER): Payer: Medicaid Other | Admitting: Physical Therapy

## 2023-10-01 ENCOUNTER — Encounter (HOSPITAL_BASED_OUTPATIENT_CLINIC_OR_DEPARTMENT_OTHER): Payer: Self-pay | Admitting: Physical Therapy

## 2023-10-03 ENCOUNTER — Ambulatory Visit (HOSPITAL_BASED_OUTPATIENT_CLINIC_OR_DEPARTMENT_OTHER): Payer: Medicaid Other | Attending: Pain Medicine | Admitting: Physical Therapy

## 2023-10-03 ENCOUNTER — Encounter (HOSPITAL_BASED_OUTPATIENT_CLINIC_OR_DEPARTMENT_OTHER): Payer: Self-pay | Admitting: Physical Therapy

## 2023-10-03 DIAGNOSIS — M6281 Muscle weakness (generalized): Secondary | ICD-10-CM | POA: Insufficient documentation

## 2023-10-03 DIAGNOSIS — M533 Sacrococcygeal disorders, not elsewhere classified: Secondary | ICD-10-CM | POA: Diagnosis present

## 2023-10-03 DIAGNOSIS — R2681 Unsteadiness on feet: Secondary | ICD-10-CM | POA: Diagnosis present

## 2023-10-03 DIAGNOSIS — M25551 Pain in right hip: Secondary | ICD-10-CM | POA: Insufficient documentation

## 2023-10-03 NOTE — Therapy (Addendum)
OUTPATIENT PHYSICAL THERAPY THORACOLUMBAR TREATMENT PHYSICAL THERAPY DISCHARGE SUMMARY  Visits from Start of Care: 12  Current functional level related to goals / functional outcomes: improved   Remaining deficits: Unknown   Education / Equipment: Management of condition/HEP   Patient agrees to discharge. Patient goals were partially met. Patient is being discharged due to not returning since the last visit.  Addend Corrie Dandy [facility]) Ziemba MPT 12/14/23 12:01 PM [facility] [address] [place], [place], [address] Phone: [phone]   Fax:  [phone]   Patient Name: Megan Chaney MRN: [id number] DOB:October 27, 1970, 52 y.o., female Today's Date: 10/03/2023  END OF SESSION:  PT End of Session - 10/03/23 1129     Visit Number 12    Number of Visits 18    Date for PT Re-Evaluation 11/04/23    Authorization Type [employer] [email or web address]    Authorization Time Period 4 visits - 09/07/23-10/06/23    Authorization - Visit Number 3    Authorization - Number of Visits 4    PT Start Time 1120    PT Stop Time 1200    PT Time Calculation (min) 40 min                 Past Medical History:  Diagnosis Date   Anemia    Fibroid    GERD (gastroesophageal reflux disease)    Past Surgical History:  Procedure Laterality Date   BUBBLE STUDY  10/30/2021   Procedure: BUBBLE STUDY;  Surgeon: Wendall Stade, MD;  Location: [facility] ENDOSCOPY;  Service: Cardiovascular;;   LOOP RECORDER INSERTION N/A 10/30/2021   Procedure: LOOP RECORDER INSERTION;  Surgeon: Lanier Prude, MD;  Location: [facility] INVASIVE CV LAB;  Service: Cardiovascular;  Laterality: N/A;   MULTIPLE TOOTH EXTRACTIONS     NO PAST SURGERIES     TEE WITHOUT CARDIOVERSION N/A 10/30/2021   Procedure: TRANSESOPHAGEAL ECHOCARDIOGRAM (TEE);  Surgeon: Wendall Stade, MD;  Location: [facility] ENDOSCOPY;  Service: Cardiovascular;  Laterality: N/A;   Patient Active Problem List   Diagnosis  Date Noted   Acute ischemic stroke (HCC) 10/26/2021   Infertility counseling 10/04/2019   Enlarged uterus 07/31/2018   Uterine leiomyoma 07/31/2018   DUB (dysfunctional uterine bleeding) 07/31/2018    PCP: Melida Quitter, MD   REFERRING PROVIDER: Maximiano Coss, MD   REFERRING DIAG: Sacrococcygeal disorders, not elsewhere classified   Rationale for Evaluation and Treatment: Rehabilitation  THERAPY DIAG:  Sacrococcygeal disorders, not elsewhere classified  Pain in right hip  Muscle weakness (generalized)  Unsteadiness on feet  ONSET DATE: Jan 2023  SUBJECTIVE:  SUBJECTIVE STATEMENT: Pt reports some increase in pain due to the weather (rain).  "I'm just moving a little bit slower.  I'm starting to realize some of the pain on right side is related to the stroke".      Initial evaluation:  Jan 2023 fell x 2 hurt right side. Had a stroke went to hospital and they found other things wrong, had a loop procedure before I left.  Had a catheterization  inMay.  I have been getting more energy as the year goes on. Anemia interfered and was unable to do therapy for a few years.  Has had DN for back earlier this year, it did help.  Has steroid shots in shoulder and hip. Sacroiliac injections ~1-2 weeks with pain relief from 10/10 now 7/10.   PERTINENT HISTORY:  10/27/21 Multifocal acute/subacute nonhemorrhagic infarcts involving the left MCA territory.   PAIN:  Are you having pain? Yes: NPRS scale: 5/10 Pain location: Rt hip, right shoulder, low back Pain description: tender Aggravating factors: weather, undefined Relieving factors: lying down, reposition, meds, recliner,sometimes walk  PRECAUTIONS: Fall  RED FLAGS: Bowel or bladder incontinence: Yes: but improved    WEIGHT BEARING  RESTRICTIONS: no  FALLS:  Has patient fallen in last 6 months? No  LIVING ENVIRONMENT: Lives with: lives with their family Lives in: House/apartment Stairs:  outdoor 3 step handrail Has following equipment at home: None  OCCUPATION: disabled  PLOF: Independent  PATIENT GOALS: decrease/ manage pain, do daily activities like walking 10 mins  NEXT MD VISIT:   OBJECTIVE:   DIAGNOSTIC FINDINGS:  CT cervical spine 1/23: IMPRESSION: No recent fracture is seen in the cervical spine. Alignment of posterior margins of vertebral bodies is unremarkable. Cervical spondylosis with mild encroachment of neural foramina at C4-C5 and C5-C6 levels. MRI 1/23: IMPRESSION: 1. Multifocal acute/subacute nonhemorrhagic infarcts involving the left MCA territory. Findings are predominantly inferior and posteriorly. There is cortical infarction also involves the more anterior middle and inferior frontal lobes. 2. No acute hemorrhage.  PATIENT SURVEYS:  LEFS:31/80  08/25/23: 28/80   COGNITION: Overall cognitive status: Within functional limits for tasks assessed     SENSATION: Paresthesia through RLE   POSTURE: forward head and guarded/tight positioning  PALPATION: TTP about ASIS, SI area through mid thoracic area.  Muscle tightness throughout paraspinals  LUMBAR ROM:   AROM eval 09/07/23  Flexion 20% limited due to pain and muscle tightness 50% limited P!  Extension full   Right lateral flexion 25% 25%  Left lateral flexion 50% 50%  Right rotation full   Left rotation full    (Blank rows = not tested)  LOWER EXTREMITY ROM:     WFL  LOWER EXTREMITY MMT:    MMT Right eval Left eval R / L 09/07/23  Hip flexion  Norm 25-26 20.3 26.9 21.1 / 33.6  Hip extension     Hip abduction  Norm: 45-47 14.3 18.5 17.6 / 26.4  Hip adduction     Hip internal rotation     Hip external rotation     Knee flexion     Knee extension  Norm 70-73 15.0 27.2 28.1  / 41.1  Ankle  dorsiflexion     Ankle plantarflexion     Ankle inversion     Ankle eversion      (Blank rows = not tested)  LUMBAR SPECIAL TESTS:  Slump test: Negative  FUNCTIONAL TESTS:  5 times sit to stand: 31.75 Timed up and go (TUG): 23.81 4 stage test:  leading lle: passed 1,2,3.  SLS x 6 s  09/07/23 TUG: 18.29s 4 Stage: SLS: 10s  10/03/23:  5x STS: 29.37   GAIT: Distance walked: 400 ft Assistive device utilized: None Level of assistance: Complete Independence Comments: Slowed cadence, shorted step length off loading slightly towards left, just slight heel strike with toe off.  TODAY'S TREATMENT:       Therapeutic exercise - NuStep L4, UE/LE x 6 min for warm up and ROM - STS from NuStep seat with staggered stance (L foot slightly forward), eccentric controlled descent x 5 - SLS LLE 8s 12s, RLE 6s, 10s  - 5x STS -  increased numbness/tingling in RLE to calf - prone on elbows x 1 min - quadruped - alternating LE ext with toe touch x 5 each, tactile cues for technique - childs pose  (modified, to tolerance) - seated rolling swiss ball forward for Lumbar and R shoulder stretch, x 8 (increased dizziness)    PATIENT EDUCATION:  Education details:  exercise progressions/ modifications  Person educated: Patient Education method: Programmer, multimedia, demo  Education comprehension: verbalized understanding  HOME EXERCISE PROGRAM: TBA  ASSESSMENT:  CLINICAL IMPRESSION: Pt reported increase in numbness and tingling into RLE after performing 5x STS; reduced to prox LE after prone on elbows.  She complains of increased pain in Rt post/lateral hip musculature with quadruped position, when transitioning all weight into RLE.  Pt participated well throughout and remains motivated to progress towards established goals.  She will continue to benefit from skilled physical therapy intervention.  Plan to create land based HEP with core, LE strengthening and balance exercise as tolerated.   Monitor O2 and  dizziness next visit.     Initial impression Patient is a 52 y.o. f who was seen today for physical therapy evaluation and treatment for Sacrococcygeal disorder. She has been disable for the past 2 years due to CVA and cardiac issues.  She had a fall which preceded diagnosis injuring her LB. Residual weakness from cva right sided.  She presents with significant strength deficits bilaterally and with functional testing she is a high fall risk. She has muscle tightness throughout paraspinals which limit her thoracic and lumbar ROM.  She will benefit from skilled physical therpy to improve her functional mobility, Adl's and return her to an improved QOL. Plan on aquatic based initially then will transition to land based when deemed approp.  She reports reduction of pain in sacrum since recent injections.   OBJECTIVE IMPAIRMENTS: Abnormal gait, decreased activity tolerance, decreased balance, decreased endurance, decreased mobility, difficulty walking, decreased strength, postural dysfunction, and pain.   ACTIVITY LIMITATIONS: carrying, lifting, bending, sitting, standing, squatting, stairs, transfers, and locomotion level  PARTICIPATION LIMITATIONS: meal prep, cleaning, laundry, driving, shopping, community activity, occupation, and yard work  PERSONAL FACTORS: Age, Fitness, and 1-2 comorbidities: Past CVA, cardia dysfunction  are also affecting patient's functional outcome.   REHAB POTENTIAL: Good  CLINICAL DECISION MAKING: Evolving/moderate complexity  EVALUATION COMPLEXITY: Moderate   GOALS: Goals reviewed with patient? Yes  SHORT TERM GOALS: Target date: 08/25/23  Pt will tolerate full aquatic sessions consistently without increase in pain and with improving function to demonstrate good toleration and effectiveness of intervention.  Baseline: Goal status:Met - 08/22/23  2.  Pt will tolerate walking to and from store 4 x week (10 minutes ea way) to demonstrated improved toleration to  waking Baseline: has walked 1 time to/from store.  Goal status: In progress - 1-2 times a week 09/02/23  3.  Pt will report decrease in LBP and sacrum to 5/10 or less Baseline: 7/10 Goal status: MET - 08/22/23    LONG TERM GOALS: Target date: POC   Pt will improve on LEFS by 9 points 40/80 to demonstrate improved function Baseline: 31/80 on eval;  28/80 Goal status: In progress - 08/25/23  2.  Pt will complete SLS x 20s to demonstrate improved balance Baseline: see chart Goal status: In Progress 09/07/23 (10s)  3.  Pt will demonstrate full lumbar flex without increase in pain Baseline: see chart above Goal status:In progress 09/07/23  4.  Pt will improve LE strength by at least 10lb to improve all functional mobility and decrease fall risk Baseline: see above  Goal status: In Progress 09/07/23  5.  Pt will improve on 5 X STS test to <or=20s to demonstrate improving functional lower extremity strength, transitional movements, and balance Baseline:31.7 at eval;  29 - 10/03/23 Goal status: In progress   6.  Pt will be indep with final HEP's (land and aquatic as appropriate) for continued management of condition Baseline: none Goal status: INITIAL  PLAN:  PT FREQUENCY: 1 x week  PT DURATION: 8 weeks   PLANNED INTERVENTIONS: Therapeutic exercises, Therapeutic activity, Neuromuscular re-education, Balance training, Gait training, Patient/Family education, Self Care, Joint mobilization, Joint manipulation, Stair training, Orthotic/Fit training, DME instructions, Aquatic Therapy, Dry Needling, Cryotherapy, Moist heat, scar mobilization, Taping, Ionotophoresis 4mg /ml Dexamethasone, Manual therapy, and Re-evaluation.  PLAN FOR NEXT SESSION: Aquatics: core and LE stretching; general strengthening; aerobic capacity training  Mayer Camel, PTA 10/03/23 1:08 PM [facility] [address] [place], [place], [address] Phone:  [phone]   Fax:  [phone]

## 2023-10-05 ENCOUNTER — Ambulatory Visit (HOSPITAL_BASED_OUTPATIENT_CLINIC_OR_DEPARTMENT_OTHER): Payer: Medicaid Other | Admitting: Physical Therapy

## 2023-10-05 NOTE — Progress Notes (Signed)
Carelink Summary Report / Loop Recorder 

## 2023-10-10 ENCOUNTER — Ambulatory Visit (HOSPITAL_BASED_OUTPATIENT_CLINIC_OR_DEPARTMENT_OTHER): Payer: Medicaid Other | Admitting: Physical Therapy

## 2023-10-14 ENCOUNTER — Telehealth (HOSPITAL_BASED_OUTPATIENT_CLINIC_OR_DEPARTMENT_OTHER): Payer: Self-pay | Admitting: Physical Therapy

## 2023-10-14 ENCOUNTER — Ambulatory Visit (HOSPITAL_BASED_OUTPATIENT_CLINIC_OR_DEPARTMENT_OTHER): Payer: Medicaid Other | Admitting: Physical Therapy

## 2023-10-14 NOTE — Telephone Encounter (Signed)
Patient did not show for aquatic PT appointment.  Called and left voicemail regarding missed appointment and requested she return phone call to confirm next upcoming appointment on Monday 12/23.  Mayer Camel, PTA 10/14/23 8:34 AM [facility] [address] [place], [place], [address] Phone: [phone]   Fax:  [phone]

## 2023-10-17 ENCOUNTER — Ambulatory Visit (INDEPENDENT_AMBULATORY_CARE_PROVIDER_SITE_OTHER): Payer: Medicaid Other

## 2023-10-17 ENCOUNTER — Ambulatory Visit (HOSPITAL_BASED_OUTPATIENT_CLINIC_OR_DEPARTMENT_OTHER): Payer: Medicaid Other

## 2023-10-17 DIAGNOSIS — I639 Cerebral infarction, unspecified: Secondary | ICD-10-CM | POA: Diagnosis not present

## 2023-10-17 LAB — CUP PACEART REMOTE DEVICE CHECK
Date Time Interrogation Session: 20241222230621
Implantable Pulse Generator Implant Date: 20230106

## 2023-10-19 ENCOUNTER — Other Ambulatory Visit: Payer: Self-pay | Admitting: Medical Genetics

## 2023-10-24 ENCOUNTER — Ambulatory Visit
Admission: RE | Admit: 2023-10-24 | Discharge: 2023-10-24 | Disposition: A | Discharge status: Home / Self Care | Payer: Medicaid Other | Source: Ambulatory Visit | Attending: Cardiology | Admitting: Cardiology

## 2023-10-24 ENCOUNTER — Other Ambulatory Visit: Payer: Self-pay | Admitting: Internal Medicine

## 2023-10-24 DIAGNOSIS — N6489 Other specified disorders of breast: Secondary | ICD-10-CM

## 2023-11-09 ENCOUNTER — Other Ambulatory Visit (HOSPITAL_COMMUNITY): Payer: Medicaid Other | Attending: Medical Genetics

## 2023-11-21 ENCOUNTER — Ambulatory Visit: Payer: Medicaid Other

## 2023-11-21 DIAGNOSIS — I639 Cerebral infarction, unspecified: Secondary | ICD-10-CM

## 2023-11-21 LAB — CUP PACEART REMOTE DEVICE CHECK
Date Time Interrogation Session: 20250126231417
Implantable Pulse Generator Implant Date: 20230106

## 2023-11-22 ENCOUNTER — Encounter: Payer: Self-pay | Admitting: Cardiology

## 2023-11-23 NOTE — Progress Notes (Signed)
[facility] Summary Report / Loop Recorder

## 2023-12-26 ENCOUNTER — Encounter

## 2023-12-26 ENCOUNTER — Ambulatory Visit (INDEPENDENT_AMBULATORY_CARE_PROVIDER_SITE_OTHER): Payer: Medicaid Other

## 2023-12-26 DIAGNOSIS — I639 Cerebral infarction, unspecified: Secondary | ICD-10-CM

## 2023-12-26 LAB — CUP PACEART REMOTE DEVICE CHECK
Date Time Interrogation Session: 20250302230933
Implantable Pulse Generator Implant Date: 20230106

## 2023-12-27 ENCOUNTER — Encounter: Payer: Self-pay | Admitting: Cardiology

## 2023-12-29 NOTE — Progress Notes (Signed)
 [facility] Summary Report / Loop Recorder

## 2024-01-26 NOTE — Progress Notes (Signed)
 [facility] Summary Report / Loop Recorder

## 2024-01-30 ENCOUNTER — Ambulatory Visit (INDEPENDENT_AMBULATORY_CARE_PROVIDER_SITE_OTHER)

## 2024-01-30 DIAGNOSIS — I639 Cerebral infarction, unspecified: Secondary | ICD-10-CM

## 2024-01-31 LAB — CUP PACEART REMOTE DEVICE CHECK
Date Time Interrogation Session: 20250406230958
Implantable Pulse Generator Implant Date: 20230106

## 2024-02-04 ENCOUNTER — Encounter: Payer: Self-pay | Admitting: Cardiology

## 2024-03-05 ENCOUNTER — Ambulatory Visit (INDEPENDENT_AMBULATORY_CARE_PROVIDER_SITE_OTHER)

## 2024-03-05 DIAGNOSIS — I639 Cerebral infarction, unspecified: Secondary | ICD-10-CM | POA: Diagnosis not present

## 2024-03-05 LAB — CUP PACEART REMOTE DEVICE CHECK
Date Time Interrogation Session: 20250511233425
Implantable Pulse Generator Implant Date: 20230106

## 2024-03-11 ENCOUNTER — Ambulatory Visit: Payer: Self-pay | Admitting: Cardiology

## 2024-03-13 NOTE — Progress Notes (Signed)
 [facility] Summary Report / Loop Recorder

## 2024-03-13 NOTE — Addendum Note (Signed)
 Addended by: Edra Govern D on: 03/13/2024 10:31 AM   Modules accepted: Orders

## 2024-03-22 ENCOUNTER — Ambulatory Visit
Admission: RE | Admit: 2024-03-22 | Discharge: 2024-03-22 | Disposition: A | Discharge status: Home / Self Care | Source: Ambulatory Visit | Attending: Internal Medicine | Admitting: Internal Medicine

## 2024-03-22 ENCOUNTER — Ambulatory Visit
Admission: RE | Admit: 2024-03-22 | Discharge: 2024-03-22 | Disposition: A | Discharge status: Home / Self Care | Source: Ambulatory Visit | Attending: Internal Medicine

## 2024-03-22 DIAGNOSIS — N6489 Other specified disorders of breast: Secondary | ICD-10-CM

## 2024-04-05 ENCOUNTER — Ambulatory Visit (INDEPENDENT_AMBULATORY_CARE_PROVIDER_SITE_OTHER)

## 2024-04-05 DIAGNOSIS — I639 Cerebral infarction, unspecified: Secondary | ICD-10-CM

## 2024-04-05 LAB — CUP PACEART REMOTE DEVICE CHECK
Date Time Interrogation Session: 20250611231620
Implantable Pulse Generator Implant Date: 20230106

## 2024-04-07 ENCOUNTER — Ambulatory Visit: Payer: Self-pay | Admitting: Cardiology

## 2024-04-09 ENCOUNTER — Encounter

## 2024-04-19 NOTE — Progress Notes (Signed)
 [facility] Summary Report / Loop Recorder

## 2024-05-07 ENCOUNTER — Ambulatory Visit: Payer: Self-pay | Admitting: Cardiology

## 2024-05-07 ENCOUNTER — Ambulatory Visit

## 2024-05-07 DIAGNOSIS — I639 Cerebral infarction, unspecified: Secondary | ICD-10-CM

## 2024-05-07 LAB — CUP PACEART REMOTE DEVICE CHECK
Date Time Interrogation Session: 20250713233307
Implantable Pulse Generator Implant Date: 20230106

## 2024-05-14 ENCOUNTER — Encounter

## 2024-05-17 NOTE — Progress Notes (Signed)
 ------------------------------------------------------------------------------- Attestation with edits by Toribio Fairy Badder, MD at 05/17/2024  5:45 PM I saw and evaluated the patient, reviewed the fellow's note and updated it as appropriate. I agree with the fellow's findings and plan as documented.  Medical decision making for this patient was moderately complex given the patient's preexisting comorbidities, chronicity of the patient's current pathology, review of relevant records from other healthcare providers, independent interpretation of imaging and/or lab results if appropriate, discussion regarding risks and benefits of proceeding forward with or holding off on scheduling a minor procedure if appropriate and designated above, the severity/progression of the pathology discussed, specific medication management if reviewed, discussion regarding financial implications of treatment, and/or the risk of complication and/or morbidity that may exist with or without treatment.  Electronically Signed by: Toribio Badder, MD, Attending Physician 05/17/2024 5:45 PM  -------------------------------------------------------------------------------  [facility] Established Patient Note  Referring Provider: No ref. provider found Primary Care Provider: Leita VEAR Blind, MD  Chief Complaint:  Chief Complaint  Patient presents with  . Follow-up  . Hip Pain    right  . Shoulder Pain    bilateral  . Neck Pain    bilateral  . Headache    Right side   History of Present Illness: Megan Chaney is a 53 y.o. old female being seen today by Toribio PARAS. Bintrim MD and pain fellow Kathyleen Picking MD for return patient office visit.  Medical records reviewed today for this visit: Previous procedure note  Interval History: The patient was last seen 6-12 months ago for procedure visit with Toribio PARAS. Bintrim, MD.  Previous injections:  September 2024 B/L SI joint  injection. June 2024 Rt greater trochanteric bursa April 2024 Rt shoulder injection  She reports that her back pain improved following an SI joint injection in 06/2023, but the pain has since returned. Currently her pain is located lumbar, thoracic spine, and cervical spine and occipital area. Today, she is experiencing more right-sided back pain around the quadratus lumborum. Her muscles are extremely tight and tender to touch. She has not participated in physical therapy since 09/2023 but is interested in resuming it. 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 She is currently taking Lyrica for neuropathic pain and Prozac, an SSRI. Of note, she has a significant history of multiple strokes and is under the care of neurology for these issues. She has undergone extensive physical therapy for stroke rehabilitation and low back pain management, which has provided some relief. Her neurology team initiated a Lyrica regimen to address her back and leg pain.  Pain Description: Since the last visit the pain is worse. The most significant location of pain is the lower, middle, and upper back. Other areas of pain include the right neck, head, and shoulder, right hip The 1-2 words that best describe the pain: sharp, stabbing, shooting, throbbing, and aching The pain improves with prescription pain medications, rest, and laying down. The pain is made worse with walking, standing, sitting, standing from a seated position, sitting from a standing position, and most movements and general daily activities. The average daily pain score is 9/10. The pain can be as high as 10/10 at its worst. The pain interferes with: All activities, Walking, Doing housework, and Sleeping.   Therapies: Did the patient have an injection/procedure since the last visit? No              Has the patient had any new  imaging (X-ray, MRI, CT) for pain concerns since the last visit? No   Has physical  therapy been initiated or completed for this pain concern? Yes - Where was physical therapy completed? At a [facility] based clinic Has a home exercise program (directed by a physical therapist or medical provider) been completed for this pain concern? Yes   Medications: Was a new medication for pain started by our clinic at the last visit? No              Is the patient on a blood thinner (including aspirin , Goody/BC/Bayer powder)? Yes             - Name of anticoagulant/blood thinner: Aspirin  81mg  ____________________________________________________________________ Past Medical History: Medical History[1]   Past Surgical History: Surgical History[2]  Family History: Family History[3]  Social History: Social History   Tobacco Use  . Smoking status: Former    Types: Cigarettes    Start date: 10/25/1998  . Smokeless tobacco: Never  Substance Use Topics  . Alcohol use: Yes    Comment: occasional    Allergies: Latex, natural rubber  Current Medications: Current Medications[4]  Review of Systems:  Pertinent positive/negative findings related to today's visit are noted in the HPI and Assessment/Plan. ____________________________________________________________________ Vitals: Vitals:   05/17/24 0943  BP: (!) 139/96  BP Location: Left arm  Patient Position: Sitting  Pulse: 81  Resp: 20  Temp: 98 F (36.7 C)  TempSrc: Temporal  SpO2: 99%  Weight: 67.6 kg (149 lb)   Physical Exam: Constitutional: Well developed, Well nourished, No acute distress and Interactive General: Patient is alert and orientated Psychological: The patient's mood is normal, and appropriate for the circumstances  Pain (Neuro/Musculoskeletal) Exam:  Gait:   Spastic. Uses assist device - none   Appropriate strength without any gross motor deficit appreciated.  Sensory:  Decreased light touch of Rt upper and lower extremities   Special Exams: Positive lumbar paraspinal tenderness  bilaterally, positive pain with lumbar facet loading bilaterally.   Bilateral SI Joint: Positive Fortin's Chaney tenderness, FABER/Patrick's test positive for posterior pain, SI joint compression test positive for posterior pain, Thigh thrust test positive for posterior pain, positive Gaenslen's test, positive Yeoman's test.  ____________________________________________________________________ Imaging and Diagnostic Studies:  All applicable diagnostic studies related to this consultation have been reviewed. Relevant diagnostic reports and/or my personal review of imaging or other diagnostic studies listed below: MRI LUMBAR SPINE WITHOUT CONTRAST  L4-5: Moderate central narrowing of the thecal sac along with mild left and borderline right subarticular lateral recess stenosis due to central disc protrusion, disc bulge, and short pedicles.   L5-S1: No impingement. The thecal sac has completely tapered at this level. High T2 signal posterior to the upper half of the L5 vertebral body appears to connect to the basilar vertebral plexus of L5, and accordingly is most compatible with mildly prominent epidural venous structure.   IMPRESSION: 1. Short pedicles and disc protrusion contribute to moderate impingement at L4-5.  I personally reviewed images, radiology results, and or diagnostic study results with the patient today. ____________________________________________________________________ Relevant Labs Reviewed: Below labs reviewed, I personally reviewed the following lab results via Care Everywhere:, CBC, BMP/CMP/Cr, and A1c  Lab Results  Component Value Date   CREATININE 0.67 03/07/2023    Lab Results  Component Value Date   WBC 8.90 03/07/2023   HGB 11.6 (L) 03/07/2023   HCT 35.3 (L) 03/07/2023   MCV 86.2 03/07/2023   PLT 353 03/07/2023    No results found  for: HGBA1C  ____________________________________________________________________ Diagnosis: 1. Sacroiliac pain   Ambulatory referral to Physical Therapy   Pain Sacroiliac Joint Injection Bilateral; Fluoroscopy    2. Spondylosis without myelopathy or radiculopathy, lumbar region       Assessment: Megan Chaney is a 53 y.o. old female with pmh of multiple strokes presenting today for follow up visit. Today she presents with pain in the lumbar, thoracic spine, and cervical spine and occipital area.She reports that her back pain improved following an SI joint injection in 06/2023, but the pain has since returned.  She experienced greater than 50% pain relief for 3 to 6 months following the last injection and would like to repeat this procedure.  She has undergone physical therapy with aquatic therapy sessions for her low back and SI joint over the past 6 months with good improvement in her pain concerns, but ran out of covered through her insurancesessions at the end of 2024.  She would like to reengage with aquatic physical therapy and referral be placed for this  Plan: Interventional treatments: -Will schedule repeat SI joint injection bilaterally -Risk factors of the above listed minor surgery/procedures were discussed with the patient. Risk factors discussed at today's visit include, but are not limited to, bleeding, infection, nerve damage, injury to adjacent structures which includes the spinal cord and/or spinal nerve roots for spine based procedures.   Medication recommendations: -Patient has neuropathic pain in her right side and has poor sleep at night. -Ordered starting a low dose amitriptyline 10mg  at bedtime. Can consider dose increase to 20 or 25mg  in future  Physical therapy: -Physical therapy for the above pain concern has been ordered as a part of today's visit. Will order repeat aquatic PT at [facility] as she did have improvement with previous sessions  Imaging/diagnostic studies recommended: -None  Consults/referrals placed: -None  Follow up recommendation: -Return for SI  joint injection.  Treatment plan fully discussed and agreed upon with the patient. All questions were answered.  Sacroiliac Joint Injection - Medical Necessity Criteria Has the patient experienced moderate to severe non-radicular low back pain below the L5 level over the location of the sacroiliac joint(s) between the iliac crest and gluteal crease for a minimum of three months duration? Yes Is the pain most consistent with pain arising from the sacroiliac joint as evidenced by reproduction of back pain with a minimum of three provocative maneuvers on physical examination today (documented as part of the physical examination in this note), and pain that is not consistent with another source of low back pain such as radiculopathy, spinal stenosis, DDD, fracture, infection, myofascial pain, etc.? Yes Has the patient undergone a minimum of four weeks of conservative therapies for treatment of this specific pain? Yes  - If yes, which therapies? Medication management with prescription and/or over the counter medications initiated and utilized for a minimum of 4 weeks for this specific concern (medications utilized listed above in the HPI). Formal physical therapy performed for a minimum 4 weeks with exercises targeting the SI joint. Home exercises for sacroiliac joint pathology directed by a physician, advanced practice provider, and/or physical therapist performed for a minimum of 4 weeks. Has the patient undergone a previous sacroiliac injection? Yes, the patient underwent previous therapeutic bilateral SI Joint injection with a minimum of 50% pain relief for approximately 3-6 months following completion of the procedure.  As a result of the above, the patient is appropriate for bilateral therapeutic sacroiliac joint injection.  Risks and benefits of this  procedure have been discussed thoroughly with the patient today. The patient understands this procedure is being performed in an effort to improve  their low back pain concerns but that results of improvement in pain cannot be guaranteed. The patient also understands that risks of the injection include, but are not limited to, lack of pain relief or increased pain following the procedure, bleeding, infection, nerve damage, injury to adjacent structures which includes the spinal cord and/or spinal nerve roots, adverse reaction to steroids and/or local anesthetic utilized for the procedure.  Electronically signed by:  Kathyleen Picking, MD 05/17/2024 12:08 AM      [1] Past Medical History: Diagnosis Date  . Anemia   . Arrhythmia   . GERD (gastroesophageal reflux disease)   . Stroke    (CMD) 10/26/2021  . Uterine leiomyoma   . Vitamin D  deficiency   [2] Past Surgical History: Procedure Laterality Date  . CARDIAC CATHETERIZATION N/A 03/07/2023   CV CLOSURE PATENT FORAMEN OVALE performed by Ripley Von Borg, MD at [facility] INVASIVE LAB  . CARDIAC CATHETERIZATION Right 03/07/2023   Right heart catheterization performed by Ripley Von Borg, MD at [facility] INVASIVE LAB  [3] Family History Problem Relation Name Age of Onset  . Heart disease Mother    . Stroke Mother    . Diabetes Mother    . Kidney disease Father    . Hypertension Father    . Asthma Father    . Breast cancer Paternal Aunt    [4] Current Outpatient Medications  Medication Sig Dispense Refill  . aspirin  81 mg EC tablet Take 81 mg by mouth Once Daily.    . atorvastatin  (LIPITOR) 40 mg tablet Take 80 mg by mouth Once Daily for 360 days. Indications: excessive fat in the blood 180 tablet 3  . baclofen (LIORESAL) 10 mg tablet Take 1 pill every 6 hours prn muscle stiffness 45 tablet 5  . calcium  carbonate-vitamin D3 500 mg (200 mg Ca)-5 mcg (200 units Vit D) per tablet Take 1 tablet by mouth Once Daily. Indications: prevention of a low amount of calcium  in the blood, prevention of vitamin D  deficiency 30 tablet 0  . ferrous sulfate  325 mg (65 mg iron ) EC tablet Take 325 mg by mouth  in the morning and 325 mg at noon and 325 mg in the evening. Take with meals.    SABRA FLUoxetine (PROzac) 40 mg capsule Take 40 mg by mouth daily.    . lidocaine  (SALONPAS) 4 % patch 1 patch as needed Externally Once a day    . metoprolol  succinate (TOPROL  XL) 25 mg 24 hr tablet Take 25 mg by mouth daily.    . polyethylene glycol (GLYCOLAX ) 17 gram packet Take 17 g by mouth daily as needed. Indications: constipation 15 packet 0  . pregabalin (LYRICA) 100 mg capsule TAKE 1 CAPSULE BY MOUTH TWICE A DAY 60 capsule 5   No current facility-administered medications for this visit.

## 2024-05-28 ENCOUNTER — Ambulatory Visit: Attending: Pain Medicine | Admitting: Physical Therapy

## 2024-05-28 ENCOUNTER — Other Ambulatory Visit: Payer: Self-pay

## 2024-05-28 ENCOUNTER — Encounter: Payer: Self-pay | Admitting: Physical Therapy

## 2024-05-28 DIAGNOSIS — M533 Sacrococcygeal disorders, not elsewhere classified: Secondary | ICD-10-CM | POA: Diagnosis present

## 2024-05-28 DIAGNOSIS — M25551 Pain in right hip: Secondary | ICD-10-CM | POA: Diagnosis present

## 2024-05-28 DIAGNOSIS — R2681 Unsteadiness on feet: Secondary | ICD-10-CM | POA: Insufficient documentation

## 2024-05-28 DIAGNOSIS — M6281 Muscle weakness (generalized): Secondary | ICD-10-CM | POA: Insufficient documentation

## 2024-05-28 NOTE — Therapy (Signed)
 OUTPATIENT PHYSICAL THERAPY THORACOLUMBAR EVALUATION   Patient Name: Megan Chaney MRN: [id number] DOB:1970-12-01, 53 y.o., female Today's Date: 05/28/2024  END OF SESSION:  PT End of Session - 05/28/24 1014     Visit Number 1    Date for PT Re-Evaluation 07/23/24    Authorization Type [employer] - requesting 16 visits    Progress Note Due on Visit 10    PT Start Time 0845    PT Stop Time 0935    PT Time Calculation (min) 50 min    Activity Tolerance Patient tolerated treatment well    Behavior During Therapy WFL for tasks assessed/performed          Past Medical History:  Diagnosis Date   Anemia    Fibroid    GERD (gastroesophageal reflux disease)    Past Surgical History:  Procedure Laterality Date   BUBBLE STUDY  10/30/2021   Procedure: BUBBLE STUDY;  Surgeon: Delford Maude BROCKS, MD;  Location: [facility] ENDOSCOPY;  Service: Cardiovascular;;   LOOP RECORDER INSERTION N/A 10/30/2021   Procedure: LOOP RECORDER INSERTION;  Surgeon: Cindie Ole DASEN, MD;  Location: [facility] INVASIVE CV LAB;  Service: Cardiovascular;  Laterality: N/A;   MULTIPLE TOOTH EXTRACTIONS     NO PAST SURGERIES     TEE WITHOUT CARDIOVERSION N/A 10/30/2021   Procedure: TRANSESOPHAGEAL ECHOCARDIOGRAM (TEE);  Surgeon: Delford Maude BROCKS, MD;  Location: [facility] ENDOSCOPY;  Service: Cardiovascular;  Laterality: N/A;   Patient Active Problem List   Diagnosis Date Noted   Acute ischemic stroke (HCC) 10/26/2021   Infertility counseling 10/04/2019   Enlarged uterus 07/31/2018   Uterine leiomyoma 07/31/2018   DUB (dysfunctional uterine bleeding) 07/31/2018    PCP: Leita Blind, MD  REFERRING PROVIDER: Catherene Toribio Pac, MD  REFERRING DIAG: M53.3 (ICD-10-CM) - Sacrococcygeal disorders, not elsewhere classified  Rationale for Evaluation and Treatment: Rehabilitation  THERAPY DIAG:  Sacrococcygeal disorders, not elsewhere classified  Pain in right hip  Muscle weakness (generalized)  Unsteadiness on feet  ONSET  DATE: chronic  SUBJECTIVE:                                                                                                                                                                                           SUBJECTIVE STATEMENT: Pt referred to OPPT for aquatic therapy with option to transition to land-based therapy as tolerated.  Pt fell two times on the same day in 2023 and was discovered to have sustained a stroke that day. History of multiple strokes and has Rt > Lt chronic global back pain (head to low back) which she attributes to the falls, sacroiliac pain bil and Rt  hip pain.  Has done extensive aquatic PT in past which really helped.  Injections in 2024 helped for about 6 mos.  Will be getting repeat SI injections 06/04/24.  Is on disability. Pt aims to walk 15 min 2-4x/week.  PERTINENT HISTORY:  significant history of multiple strokes - has undergone extensive PT for stroke rehabilitation and low back pain management including aquatic PT, which has provided some relief  10/27/21 Multifocal acute/subacute nonhemorrhagic infarcts involving the left MCA territory.  Previous injections:  September 2024 B/L SI joint injection - provided relief for 3-6 mos, will be scheduling repeat injections  June 2024 Rt greater trochanteric bursa  currently taking Lyrica and Baclofen for neuropathic pain History of cardiac cath  Has loop recorder  PAIN:  PAIN:  Are you having pain? Yes NPRS scale: 9-10/10 - can be daily but meds help  Pain location: bil LBP and SI joint pain, Rt hip, Rt shoulder, Rt base of head/neck to top of head Pain orientation: Right and Bilateral  PAIN TYPE: aching, dull, sharp, shooting back pains and from neck to top of head, and piercing Pain description: intermittent  Aggravating factors: laying down, bending Relieving factors: medications, sleep   PRECAUTIONS: None  RED FLAGS: None  WEIGHT BEARING RESTRICTIONS: No  FALLS:  Has patient fallen in last 6  months? No but has a fear of falling  LIVING ENVIRONMENT: Lives with: lives with roommates - is looking for housing Lives in: House/apartment Stairs: No Has following equipment at home: Single point cane but isn't using it lately - does better without it  OCCUPATION: disabled  PLOF: Independent with basic ADLs, Independent with household mobility without device, Independent with community mobility without device, and Independent with transfers  PATIENT GOALS: learn an aquatic and land based HEP to help manage pain and become less dependent on medications/injections  NEXT MD VISIT: 06/04/24 for bil SI injections  OBJECTIVE:  Note: Objective measures were completed at Evaluation unless otherwise noted.  DIAGNOSTIC FINDINGS:  MRI LUMBAR SPINE WITHOUT CONTRAST  L4-5: Moderate central narrowing of the thecal sac along with mild left and borderline right subarticular lateral recess stenosis due to central disc protrusion, disc bulge, and short pedicles.  PATIENT SURVEYS:  LEFS  Extreme difficulty/unable (0), Quite a bit of difficulty (1), Moderate difficulty (2), Little difficulty (3), No difficulty (4) Survey date:  05/28/24  Any of your usual work, housework or school activities 3  2. Usual hobbies, recreational or sporting activities 1  3. Getting into/out of the bath 2  4. Walking between rooms 2  5. Putting on socks/shoes 1  6. Squatting  1  7. Lifting an object, like a bag of groceries from the floor 1  8. Performing light activities around your home 2  9. Performing heavy activities around your home 1  10. Getting into/out of a car 1  11. Walking 2 blocks 1  12. Walking 1 mile 0  13. Going up/down 10 stairs (1 flight) 0  14. Standing for 1 hour 0  15.  sitting for 1 hour 2  16. Running on even ground 0  17. Running on uneven ground 0  18. Making sharp turns while running fast 0  19. Hopping  0  20. Rolling over in bed 1  Score total:  19/80     COGNITION: Overall  cognitive status: Within functional limits for tasks assessed     SENSATION: Intermittent shooting pain with numbness into toes  MUSCLE LENGTH: Hamstrings: Right 60 with  mild spasticity deg; Left 70 deg Thomas test: Right very tight can't get to 0 deg - flexed to 15 deg Right gluteals, piriformis limited 50% compared to [employer] and hip flexor on Rt signif limited  POSTURE: Rt pelvic inflare with Rt LE IR with in-toeing, some spasticity contributing  PALPATION: Signif TTP Rt>Lt SI joint Signif tension/hypertonicity in Rt adductors, hamstrings, proximal quads/hip flexors, hip internal rotators Significant tension and spasm present in trunk paraspinals thoracic to lumbar bil Hypomobile Rt>Lt SI joints with pain  LUMBAR ROM:   AROM eval  Flexion Braces hands on thighs and walks hands down to mid-thigh without lumbar reversal, signif pain  Extension NT  Right lateral flexion 75% limitated  Left lateral flexion 75% limitated  Right rotation 75% limitated  Left rotation 75% limitated   (Blank rows = not tested)  LOWER EXTREMITY ROM:    Rt hip ER 35 deg Rt hip ext to neutral - very tight hip flexors and quads   LOWER EXTREMITY MMT:    MMT Right eval Left eval  Hip flexion 3+ 4+  Hip extension 3+ 4  Hip abduction 3+ 4  Hip adduction 3+ 4  Hip internal rotation 4- 4  Hip external rotation 3+ 4  Knee flexion 3+ 4  Knee extension 4- 4  Ankle dorsiflexion 4 4  Ankle plantarflexion 4 4  Ankle inversion    Ankle eversion     (Blank rows = not tested)  LUMBAR SPECIAL TESTS:  SI Compression/distraction test: Positive and FABER test: Positive  FUNCTIONAL TESTS:  5 times sit to stand: 1:01.07 signif use of hands on chair, avoids hip hinge due to pain Timed up and go (TUG): 27.29 no AD : 32.28 sec no AD  GAIT: Distance walked: 30 feet Assistive device utilized: None Level of assistance: Modified independence Comments: Rt LE IR with in-toeing, short stance time on  Rt, small stride length bil, lacks trunk rotation  TREATMENT DATE:  05/28/24: gave aquatic info handout, discussed goals and POC, initiated land-based HEP                                                                                                                             PATIENT EDUCATION:  Education details: [email or web address] Person educated: Patient Education method: Explanation and Handouts Education comprehension: verbalized understanding  HOME EXERCISE PROGRAM: Access Code: [email or web address] URL: [email or web address] Date: 05/28/2024 Prepared by: Orvil Kloi Brodman  Exercises - Hooklying Single Knee to Chest Stretch  - 1 x daily - 7 x weekly - 3 sets - 10 reps - Supine Posterior Pelvic Tilt  - 1 x daily - 7 x weekly - 3 sets - 10 reps - Supine Figure 4 Piriformis Stretch  - 1 x daily - 7 x weekly - 3 sets - 10 reps - Supine Piriformis Stretch with Foot on Ground  - 1 x daily - 7 x weekly - 3 sets - 10 reps - Sidelying Transversus Abdominis Bracing  -  1 x daily - 7 x weekly - 1 sets - 10 reps - 5 hold  ASSESSMENT:  CLINICAL IMPRESSION: Patient is a 53 y.o. female with complex medical history who was seen today for physical therapy evaluation and treatment for chronic global back pain and Rt LE pain and weakness.  She had two falls on the same day in 2023 and was discovered to have had a stroke.  She is on disability and is in temporary housing arrangements.  Assessment today reveals significant restriction in Rt hemipelvis, hip and knee related to some hypertonicity/spasticity in Rt LE.  Pt has very restricted Rt>Lt SI joint mobility.  She performs transfers and ambulates slowly secondary to significant pain which she rates as 9-10/10.  Her times tests for 5xSTS, TUG, and support that she is at high risk for falls.  Her LEFS score is 19/80 which is signif disability.  She is scheduled to get repeat injections into bil SI joints on 06/04/24 as these gave her several months of  relief last year.  Pt has had aquatic PT in the past which was very helpful. We discussed a hybrid plan of land-based and aquatic-based PT with more efforts to get into aquatic PT schedule on the front end of PT given her pain levels and functional mobility restrictions.  Pt will benefit from skilled PT to maximize her functional strength, mobility, gait and safety, along with educate her on aquatic and land based HEP to support gains made in PT.  OBJECTIVE IMPAIRMENTS: Abnormal gait, decreased activity tolerance, decreased coordination, decreased endurance, decreased mobility, difficulty walking, decreased ROM, decreased strength, hypomobility, increased muscle spasms, impaired flexibility, impaired sensation, impaired tone, improper body mechanics, postural dysfunction, and pain.   ACTIVITY LIMITATIONS: carrying, lifting, bending, sitting, standing, squatting, sleeping, transfers, bed mobility, bathing, dressing, hygiene/grooming, and locomotion level  PARTICIPATION LIMITATIONS: cleaning, laundry, driving, shopping, and community activity  PERSONAL FACTORS: 1-2 comorbidities: history of multiple strokes, cardiac history, on disability, time since onset are also affecting patient's functional outcome.   REHAB POTENTIAL: Excellent  CLINICAL DECISION MAKING: Evolving/moderate complexity  EVALUATION COMPLEXITY: Moderate   GOALS: Goals reviewed with patient? Yes  SHORT TERM GOALS: Target date: 06/25/24  Pt will be ind with initial HEP for both land and aquatic environments without exacerbation of pain Baseline: Goal status: INITIAL  2.  Pt will improve 5x STS to 45 sec or less using proper body mechanics and mod use of UE Baseline:  Goal status: INITIAL  3.  Pt will improve lumbar ROM to allow greater ease for bending to don shoes/socks Baseline:  Goal status: INITIAL  4.  Pt will report improved pain to 7-8/10 with ADLs Baseline: 9-10/10 Goal status: INITIAL  5.  Pt will be able to  perform aquatic ambulation for 10 min with brief rests as needed to work on cardiovascular endurance and functional ROM/strength of bil LE Baseline:  Goal status: INITIAL    LONG TERM GOALS: Target date: 07/23/24  Pt will be ind with advanced/final HEP for aquatic and land based HEP to maintain gains made in PT Baseline:  Goal status: INITIAL  2.  LEFS score to improve by at least 8 points to demo reduced limitations Baseline: 19/80 Goal status: INITIAL  3.  TUG test to improve to 22 sec or less to demo improved functional transfer and gait safety and mobility. Baseline: 27.29 Goal status: INITIAL  4.  Pt will improve Rt quad and hip flexor mobility to allow for full stride length  of Lt LE for more efficient smooth gait Baseline:  Goal status: INITIAL  5.  Pt will perform in 25 sec or less to demo improved gait speed for community access. Baseline:  Goal status: INITIAL  6.  Pt will achieve at least 4/5 Rt LE strength to allow for improved stairs to access housing options with stairs. Baseline:  Goal status: INITIAL  PLAN:  PT FREQUENCY: 2x/week  PT DURATION: 8 weeks  PLANNED INTERVENTIONS: 97110-Therapeutic exercises, 97530- Therapeutic activity, 97112- Neuromuscular re-education, 97535- Self Care, [id number]- Manual therapy, [phone]- Gait training, [phone]- Aquatic Therapy, [phone]- Electrical stimulation (unattended), [phone]- Traction (mechanical), [phone] (1-2 muscles), [phone] (3+ muscles)- Dry Needling, Patient/Family education, Balance training, Stair training, Taping, Joint mobilization, Spinal mobilization, DME instructions, Cryotherapy, and Moist heat.  PLAN FOR NEXT SESSION: aquatic: walking for endurance and LE mobility, Rt LE mobility into hip ext, Rt knee flexion, hamstring stretching, Rt hip ER (Rt hemipelvis is IR with in-toeing - has some spasticity), land: review HEP, try gentle stretching of Rt hip toward ER, ext, knee flexion for anterior thigh and hip stretching,  gentle spine ROM, manual therapy, sit to stand   [employer], PT 05/28/24 10:15 AM

## 2024-05-28 NOTE — Progress Notes (Signed)
 [facility] Summary Report / Loop Recorder

## 2024-05-28 NOTE — Patient Instructions (Signed)
 [facility]  Welcome to [facility]! Here you will find all the information you will need regarding your pool therapy. If you have further questions at any time, please call our office at [phone]. After completing your initial evaluation in the [facility], you may be eligible to complete a portion of your therapy in the pool. A typical week of therapy will consist of 1-2 typical physical therapy visits at our [facility] location and an additional session of therapy in the pool located at the [facility] at [facility]. [address], [place] [address]. The phone number at the pool site is [phone]. Please call this number if you are running late or need to cancel your appointment.  Check-in on MyChart then meet your therapist at the pool deck. (If you can't access MyChart, you may check in with the therapist at the pool deck.)   Each session will last approximately 45 minutes. All scheduling and payments for aquatic therapy sessions, including cancelations, will be done through our [facility] location.  To be eligible for aquatic therapy, these criteria must be met: You must be able to independently change in the locker room and get to the pool deck. A caregiver can come with you to help if needed however they do need to be the same sex to enter the locker room. Or you may change in a bathroom privately with opposite sex caregiver if needed. There are benches for a caregiver to sit on next to the pool.  Handicap parking is available in the front and there is a drop off option for even closer accessibility.  Please arrive 15 minutes prior to your appointment to prepare for your pool session. You must sign in at the front desk upon your arrival. Please be sure to attend to any toileting needs prior to entering the pool. Locker rooms for changing are available.  There is direct access to the pool deck from the locker  room. You can lock your belongings in a locker or bring them with you poolside. Your therapist will greet you on the pool deck. There may be other swimmers in the pool at the same time but your session is one-on-one with the therapist.    What to Expect Arrive 15 min early for your appointment and check in with rehab front desk. Please limit use of body lotions and hair products before entering the pool. Locker rooms are available for showering, changing and toileting. Appointments are 45 minutes with your therapist. (This does not include changing times) The pool is approximately 500 feet from the nearest parking lot. There are benches and chairs along the walk. Please bring a support person if you need assistance traveling the distanceto the pool or assistance with changing/toileting. Stairs with handrails as well as a lift chair are available at the pool.  Depth is 3'6"-4'8" and temperature is between 88-90 degrees. The pool deck is tile flooring and gets slippery, water shoes are strongly encouraged but not required. Please wear a bathing suit or athletic shorts and a t-shirt. Recommended to bring your own towel. Severe weather:Thunder or lightning results in closure of the pool deck for 30 minutes and is extended with each incidence. Your appointment may be moved to land or canceled with the option to reschedule. Tell your therapist if you have any of the following: Open wounds Active infection Fear of water Bowel or bladder incontinence  Benefits of Aquatic Therapy:  Reduces Stress on Joints  and Muscles  The buoyancy of water supports body weight, making movement easier and less painful.  Builds Strength and Stability  The viscosity of water provides resistance that allows individuals to strengthen muscles while also providing a safe environment to improve balance and coordination.  Promotes Relaxation  The warm water and the feeling of being supported can help reduce stress and be  beneficial for overall well-being.

## 2024-05-29 ENCOUNTER — Encounter (HOSPITAL_BASED_OUTPATIENT_CLINIC_OR_DEPARTMENT_OTHER): Payer: Self-pay | Admitting: Physical Therapy

## 2024-05-29 ENCOUNTER — Ambulatory Visit (HOSPITAL_BASED_OUTPATIENT_CLINIC_OR_DEPARTMENT_OTHER): Attending: Pain Medicine | Admitting: Physical Therapy

## 2024-05-29 DIAGNOSIS — R2681 Unsteadiness on feet: Secondary | ICD-10-CM | POA: Diagnosis present

## 2024-05-29 DIAGNOSIS — M533 Sacrococcygeal disorders, not elsewhere classified: Secondary | ICD-10-CM | POA: Insufficient documentation

## 2024-05-29 DIAGNOSIS — M6281 Muscle weakness (generalized): Secondary | ICD-10-CM | POA: Diagnosis present

## 2024-05-29 DIAGNOSIS — M25551 Pain in right hip: Secondary | ICD-10-CM | POA: Insufficient documentation

## 2024-05-29 NOTE — Therapy (Signed)
 OUTPATIENT PHYSICAL THERAPY THORACOLUMBAR TREATMENT   Patient Name: Megan Chaney MRN: [id number] DOB:05-19-1971, 53 y.o., female Today's Date: 05/29/2024  END OF SESSION:  PT End of Session - 05/29/24 1634     Visit Number 2    Date for PT Re-Evaluation 07/23/24    Authorization Type [employer] - requesting 16 visits    Progress Note Due on Visit 10    PT Start Time 1400    PT Stop Time 1439    PT Time Calculation (min) 39 min    Activity Tolerance Patient tolerated treatment well    Behavior During Therapy WFL for tasks assessed/performed           Past Medical History:  Diagnosis Date   Anemia    Fibroid    GERD (gastroesophageal reflux disease)    Past Surgical History:  Procedure Laterality Date   BUBBLE STUDY  10/30/2021   Procedure: BUBBLE STUDY;  Surgeon: Delford Maude BROCKS, MD;  Location: [facility] ENDOSCOPY;  Service: Cardiovascular;;   LOOP RECORDER INSERTION N/A 10/30/2021   Procedure: LOOP RECORDER INSERTION;  Surgeon: Cindie Ole DASEN, MD;  Location: [facility] INVASIVE CV LAB;  Service: Cardiovascular;  Laterality: N/A;   MULTIPLE TOOTH EXTRACTIONS     NO PAST SURGERIES     TEE WITHOUT CARDIOVERSION N/A 10/30/2021   Procedure: TRANSESOPHAGEAL ECHOCARDIOGRAM (TEE);  Surgeon: Delford Maude BROCKS, MD;  Location: [facility] ENDOSCOPY;  Service: Cardiovascular;  Laterality: N/A;   Patient Active Problem List   Diagnosis Date Noted   Acute ischemic stroke (HCC) 10/26/2021   Infertility counseling 10/04/2019   Enlarged uterus 07/31/2018   Uterine leiomyoma 07/31/2018   DUB (dysfunctional uterine bleeding) 07/31/2018    PCP: Leita Blind, MD  REFERRING PROVIDER: Catherene Toribio Pac, MD  REFERRING DIAG: M53.3 (ICD-10-CM) - Sacrococcygeal disorders, not elsewhere classified  Rationale for Evaluation and Treatment: Rehabilitation  THERAPY DIAG:  Sacrococcygeal disorders, not elsewhere classified  Pain in right hip  Muscle weakness (generalized)  Unsteadiness on feet  ONSET  DATE: chronic  SUBJECTIVE:                                                                                                                                                                                           SUBJECTIVE STATEMENT: Pt reports no changes since evaluation yesterday.     POOL ACCESS:   From initial evaluation:  Pt referred to OPPT for aquatic therapy with option to transition to land-based therapy as tolerated.  Pt fell two times on the same day in 2023 and was discovered to have sustained a stroke that day. History of multiple strokes and has Rt >  Lt chronic global back pain (head to low back) which she attributes to the falls, sacroiliac pain bil and Rt hip pain.  Has done extensive aquatic PT in past which really helped.  Injections in 2024 helped for about 6 mos.  Will be getting repeat SI injections 06/04/24.  Is on disability. Pt aims to walk 15 min 2-4x/week.  PERTINENT HISTORY:  significant history of multiple strokes - has undergone extensive PT for stroke rehabilitation and low back pain management including aquatic PT, which has provided some relief  10/27/21 Multifocal acute/subacute nonhemorrhagic infarcts involving the left MCA territory.  Previous injections:  September 2024 B/L SI joint injection - provided relief for 3-6 mos, will be scheduling repeat injections  June 2024 Rt greater trochanteric bursa  currently taking Lyrica and Baclofen for neuropathic pain History of cardiac cath  Has loop recorder  PAIN:  PAIN:  Are you having pain? Yes NPRS scale: 10/10  Pain location: bil LBP and SI joint pain Pain orientation: Right and Bilateral  PAIN TYPE: aching, dull, sharp, shooting back pains Pain description: intermittent  Aggravating factors: laying down, bending Relieving factors: medications, sleep   PRECAUTIONS: None  RED FLAGS: None  WEIGHT BEARING RESTRICTIONS: No  FALLS:  Has patient fallen in last 6 months? No but has a fear of  falling  LIVING ENVIRONMENT: Lives with: lives with roommates - is looking for housing Lives in: House/apartment Stairs: No Has following equipment at home: Single point cane but isn't using it lately - does better without it  OCCUPATION: disabled  PLOF: Independent with basic ADLs, Independent with household mobility without device, Independent with community mobility without device, and Independent with transfers  PATIENT GOALS: learn an aquatic and land based HEP to help manage pain and become less dependent on medications/injections  NEXT MD VISIT: 06/04/24 for bil SI injections  OBJECTIVE:  Note: Objective measures were completed at Evaluation unless otherwise noted.  DIAGNOSTIC FINDINGS:  MRI LUMBAR SPINE WITHOUT CONTRAST  L4-5: Moderate central narrowing of the thecal sac along with mild left and borderline right subarticular lateral recess stenosis due to central disc protrusion, disc bulge, and short pedicles.  PATIENT SURVEYS:  LEFS  Extreme difficulty/unable (0), Quite a bit of difficulty (1), Moderate difficulty (2), Little difficulty (3), No difficulty (4) Survey date:  05/28/24  Any of your usual work, housework or school activities 3  2. Usual hobbies, recreational or sporting activities 1  3. Getting into/out of the bath 2  4. Walking between rooms 2  5. Putting on socks/shoes 1  6. Squatting  1  7. Lifting an object, like a bag of groceries from the floor 1  8. Performing light activities around your home 2  9. Performing heavy activities around your home 1  10. Getting into/out of a car 1  11. Walking 2 blocks 1  12. Walking 1 mile 0  13. Going up/down 10 stairs (1 flight) 0  14. Standing for 1 hour 0  15.  sitting for 1 hour 2  16. Running on even ground 0  17. Running on uneven ground 0  18. Making sharp turns while running fast 0  19. Hopping  0  20. Rolling over in bed 1  Score total:  19/80     COGNITION: Overall cognitive status: Within  functional limits for tasks assessed     SENSATION: Intermittent shooting pain with numbness into toes  MUSCLE LENGTH: Hamstrings: Right 60 with mild spasticity deg; Left 70 deg Debby test:  Right very tight can't get to 0 deg - flexed to 15 deg Right gluteals, piriformis limited 50% compared to Lt Quad and hip flexor on Rt signif limited  POSTURE: Rt pelvic inflare with Rt LE IR with in-toeing, some spasticity contributing  PALPATION: Signif TTP Rt>Lt SI joint Signif tension/hypertonicity in Rt adductors, hamstrings, proximal quads/hip flexors, hip internal rotators Significant tension and spasm present in trunk paraspinals thoracic to lumbar bil Hypomobile Rt>Lt SI joints with pain  LUMBAR ROM:   AROM eval  Flexion Braces hands on thighs and walks hands down to mid-thigh without lumbar reversal, signif pain  Extension NT  Right lateral flexion 75% limitated  Left lateral flexion 75% limitated  Right rotation 75% limitated  Left rotation 75% limitated   (Blank rows = not tested)  LOWER EXTREMITY ROM:    Rt hip ER 35 deg Rt hip ext to neutral - very tight hip flexors and quads   LOWER EXTREMITY MMT:    MMT Right eval Left eval  Hip flexion 3+ 4+  Hip extension 3+ 4  Hip abduction 3+ 4  Hip adduction 3+ 4  Hip internal rotation 4- 4  Hip external rotation 3+ 4  Knee flexion 3+ 4  Knee extension 4- 4  Ankle dorsiflexion 4 4  Ankle plantarflexion 4 4  Ankle inversion    Ankle eversion     (Blank rows = not tested)  LUMBAR SPECIAL TESTS:  SI Compression/distraction test: Positive and FABER test: Positive  FUNCTIONAL TESTS:  5 times sit to stand: 1:01.07 signif use of hands on chair, avoids hip hinge due to pain Timed up and go (TUG): 27.29 no AD : 32.28 sec no AD  GAIT: Distance walked: 30 feet Assistive device utilized: None Level of assistance: Modified independence Comments: Rt LE IR with in-toeing, short stance time on Rt, small stride length  bil, lacks trunk rotation  TREATMENT DATE:  [facility] Adult PT Treatment:                                             Date: 05/29/24 Pt seen for aquatic therapy today.  Treatment took place in water 3.5-4.75 ft in depth at the [facility] pool. Temp of water was 91.  Pt entered/exited the pool via stairs independently with bilat rail.  - reacquainting to aquatic therapy principles - UE on barbell walking backward - 3 laps; side stepping  - squatted rest  - side stepping with UE horz abdct/add -> add/abdct with short hollow noodles - forward / backward marching with row motion - UE on wall:   hip add/abdct x 10  -return to walking with arm swing - forward/ backward and side stepping  - straddling noodle with UE support: cycling   Pt requires the buoyancy and hydrostatic pressure of water for support, and to offload joints by unweighting joint load by at least 50 % in navel deep water and by at least 75-80% in chest to neck deep water.  Viscosity of the water is needed for resistance of strengthening. Water current perturbations provides challenge to standing balance requiring increased core activation.  PATIENT EDUCATION:  Education details: [email or web address] Person educated: Patient Education method: Explanation Education comprehension: verbalized understanding  HOME EXERCISE PROGRAM: Access Code: [email or web address] URL: [email or web address] Date: 05/28/2024 Prepared by: Orvil Beuhring  Exercises - Hooklying Single Knee to Chest Stretch  - 1 x daily - 7 x weekly - 3 sets - 10 reps - Supine Posterior Pelvic Tilt  - 1 x daily - 7 x weekly - 3 sets - 10 reps - Supine Figure 4 Piriformis Stretch  - 1 x daily - 7 x weekly - 3 sets - 10 reps - Supine Piriformis Stretch with Foot on Ground  - 1 x daily - 7 x weekly - 3 sets - 10 reps - Sidelying Transversus  Abdominis Bracing  - 1 x daily - 7 x weekly - 1 sets - 10 reps - 5 hold  ASSESSMENT:  CLINICAL IMPRESSION: Pt is well known to clinic from previous episode of care.  She demonstrates safety and independence in aquatic setting with therapist instructing from deck. She initially requested UE floatation support but was able to progress to no UE support without difficulty. Short hollow noodles were tolerated well with RUE; all others were too difficult to push under water. Pt reported gradual reduction of back pain, by 2-3 points.   Goals are ongoing.   From initial evaluation:  Patient is a 53 y.o. female with complex medical history who was seen today for physical therapy evaluation and treatment for chronic global back pain and Rt LE pain and weakness.  She had two falls on the same day in 2023 and was discovered to have had a stroke.  She is on disability and is in temporary housing arrangements.  Assessment today reveals significant restriction in Rt hemipelvis, hip and knee related to some hypertonicity/spasticity in Rt LE.  Pt has very restricted Rt>Lt SI joint mobility.  She performs transfers and ambulates slowly secondary to significant pain which she rates as 9-10/10.  Her times tests for 5xSTS, TUG, and support that she is at high risk for falls.  Her LEFS score is 19/80 which is signif disability.  She is scheduled to get repeat injections into bil SI joints on 06/04/24 as these gave her several months of relief last year.  Pt has had aquatic PT in the past which was very helpful. We discussed a hybrid plan of land-based and aquatic-based PT with more efforts to get into aquatic PT schedule on the front end of PT given her pain levels and functional mobility restrictions.  Pt will benefit from skilled PT to maximize her functional strength, mobility, gait and safety, along with educate her on aquatic and land based HEP to support gains made in PT.  OBJECTIVE IMPAIRMENTS: Abnormal gait,  decreased activity tolerance, decreased coordination, decreased endurance, decreased mobility, difficulty walking, decreased ROM, decreased strength, hypomobility, increased muscle spasms, impaired flexibility, impaired sensation, impaired tone, improper body mechanics, postural dysfunction, and pain.   ACTIVITY LIMITATIONS: carrying, lifting, bending, sitting, standing, squatting, sleeping, transfers, bed mobility, bathing, dressing, hygiene/grooming, and locomotion level  PARTICIPATION LIMITATIONS: cleaning, laundry, driving, shopping, and community activity  PERSONAL FACTORS: 1-2 comorbidities: history of multiple strokes, cardiac history, on disability, time since onset are also affecting patient's functional outcome.   REHAB POTENTIAL: Excellent  CLINICAL DECISION MAKING: Evolving/moderate complexity  EVALUATION COMPLEXITY: Moderate   GOALS: Goals reviewed with patient? Yes  SHORT TERM GOALS: Target date: 06/25/24  Pt will be ind with initial HEP for both land and aquatic environments without exacerbation of  pain Baseline: Goal status: INITIAL  2.  Pt will improve 5x STS to 45 sec or less using proper body mechanics and mod use of UE Baseline:  Goal status: INITIAL  3.  Pt will improve lumbar ROM to allow greater ease for bending to don shoes/socks Baseline:  Goal status: INITIAL  4.  Pt will report improved pain to 7-8/10 with ADLs Baseline: 9-10/10 Goal status: INITIAL  5.  Pt will be able to perform aquatic ambulation for 10 min with brief rests as needed to work on cardiovascular endurance and functional ROM/strength of bil LE Baseline:  Goal status: INITIAL    LONG TERM GOALS: Target date: 07/23/24  Pt will be ind with advanced/final HEP for aquatic and land based HEP to maintain gains made in PT Baseline:  Goal status: INITIAL  2.  LEFS score to improve by at least 8 points to demo reduced limitations Baseline: 19/80 Goal status: INITIAL  3.  TUG test to  improve to 22 sec or less to demo improved functional transfer and gait safety and mobility. Baseline: 27.29 Goal status: INITIAL  4.  Pt will improve Rt quad and hip flexor mobility to allow for full stride length of Lt LE for more efficient smooth gait Baseline:  Goal status: INITIAL  5.  Pt will perform in 25 sec or less to demo improved gait speed for community access. Baseline:  Goal status: INITIAL  6.  Pt will achieve at least 4/5 Rt LE strength to allow for improved stairs to access housing options with stairs. Baseline:  Goal status: INITIAL  PLAN:  PT FREQUENCY: 2x/week  PT DURATION: 8 weeks  PLANNED INTERVENTIONS: 97110-Therapeutic exercises, 97530- Therapeutic activity, 97112- Neuromuscular re-education, 97535- Self Care, [id number]- Manual therapy, [phone]- Gait training, [phone]- Aquatic Therapy, [phone]- Electrical stimulation (unattended), [phone]- Traction (mechanical), [phone] (1-2 muscles), [phone] (3+ muscles)- Dry Needling, Patient/Family education, Balance training, Stair training, Taping, Joint mobilization, Spinal mobilization, DME instructions, Cryotherapy, and Moist heat.  PLAN FOR NEXT SESSION: aquatic: walking for endurance and LE mobility, Rt LE mobility into hip ext, Rt knee flexion, hamstring stretching, Rt hip ER (Rt hemipelvis is IR with in-toeing - has some spasticity), land: review HEP, try gentle stretching of Rt hip toward ER, ext, knee flexion for anterior thigh and hip stretching, gentle spine ROM, manual therapy, sit to stand  Delon Aquas, PTA 05/29/24 4:38 PM [facility] [address] [place], [place], [address] Phone: [phone]   Fax:  [phone]

## 2024-06-07 ENCOUNTER — Ambulatory Visit (INDEPENDENT_AMBULATORY_CARE_PROVIDER_SITE_OTHER)

## 2024-06-07 DIAGNOSIS — I639 Cerebral infarction, unspecified: Secondary | ICD-10-CM | POA: Diagnosis not present

## 2024-06-07 LAB — CUP PACEART REMOTE DEVICE CHECK
Date Time Interrogation Session: 20250813232419
Implantable Pulse Generator Implant Date: 20230106

## 2024-06-08 ENCOUNTER — Ambulatory Visit: Payer: Self-pay | Admitting: Cardiology

## 2024-06-13 ENCOUNTER — Encounter: Payer: Self-pay | Admitting: Physical Therapy

## 2024-06-13 ENCOUNTER — Ambulatory Visit: Admitting: Physical Therapy

## 2024-06-13 DIAGNOSIS — M6281 Muscle weakness (generalized): Secondary | ICD-10-CM

## 2024-06-13 DIAGNOSIS — M533 Sacrococcygeal disorders, not elsewhere classified: Secondary | ICD-10-CM | POA: Diagnosis not present

## 2024-06-13 DIAGNOSIS — R2681 Unsteadiness on feet: Secondary | ICD-10-CM

## 2024-06-13 DIAGNOSIS — M25551 Pain in right hip: Secondary | ICD-10-CM

## 2024-06-13 NOTE — Therapy (Signed)
 OUTPATIENT PHYSICAL THERAPY THORACOLUMBAR TREATMENT   Patient Name: Megan Chaney MRN: [id number] DOB:[birth year], 53 y.o., female Today's Date: 06/13/2024  END OF SESSION:  PT End of Session - 06/13/24 1618     Visit Number 3    Date for PT Re-Evaluation 07/23/24    Authorization Type [employer] Approved 7 visits-05/28/2024-07/26/2024-auth# [id number]    Authorization Time Period -05/28/2024-07/26/2024    Authorization - Visit Number 2    Authorization - Number of Visits 7    Progress Note Due on Visit 10    PT Start Time 1531    PT Stop Time 1615    PT Time Calculation (min) 44 min    Activity Tolerance Patient tolerated treatment well    Behavior During Therapy WFL for tasks assessed/performed            Past Medical History:  Diagnosis Date   Anemia    Fibroid    GERD (gastroesophageal reflux disease)    Past Surgical History:  Procedure Laterality Date   BUBBLE STUDY  10/30/2021   Procedure: BUBBLE STUDY;  Surgeon: Delford Maude BROCKS, MD;  Location: [facility] ENDOSCOPY;  Service: Cardiovascular;;   LOOP RECORDER INSERTION N/A 10/30/2021   Procedure: LOOP RECORDER INSERTION;  Surgeon: Cindie Ole DASEN, MD;  Location: [facility] INVASIVE CV LAB;  Service: Cardiovascular;  Laterality: N/A;   MULTIPLE TOOTH EXTRACTIONS     NO PAST SURGERIES     TEE WITHOUT CARDIOVERSION N/A 10/30/2021   Procedure: TRANSESOPHAGEAL ECHOCARDIOGRAM (TEE);  Surgeon: Delford Maude BROCKS, MD;  Location: [facility] ENDOSCOPY;  Service: Cardiovascular;  Laterality: N/A;   Patient Active Problem List   Diagnosis Date Noted   Acute ischemic stroke (HCC) 10/26/2021   Infertility counseling 10/04/2019   Enlarged uterus 07/31/2018   Uterine leiomyoma 07/31/2018   DUB (dysfunctional uterine bleeding) 07/31/2018    PCP: Leita Blind, MD  REFERRING PROVIDER: Catherene Toribio Pac, MD  REFERRING DIAG: M53.3 (ICD-10-CM) - Sacrococcygeal disorders, not elsewhere classified  Rationale for Evaluation and Treatment:  Rehabilitation  THERAPY DIAG:  Sacrococcygeal disorders, not elsewhere classified  Pain in right hip  Muscle weakness (generalized)  Unsteadiness on feet  ONSET DATE: chronic  SUBJECTIVE:                                                                                                                                                                                           SUBJECTIVE STATEMENT: Patient presents with increased tingling and sciatic nerve symptoms in her left leg today. Pain is 6/10.  POOL ACCESS:   From initial evaluation:  Pt referred to [facility] for aquatic therapy with option to transition  to land-based therapy as tolerated.  Pt fell two times on the same day in 2023 and was discovered to have sustained a stroke that day. History of multiple strokes and has Rt > Lt chronic global back pain (head to low back) which she attributes to the falls, sacroiliac pain bil and Rt hip pain.  Has done extensive aquatic PT in past which really helped.  Injections in 2024 helped for about 6 mos.  Will be getting repeat SI injections 06/04/24.  Is on disability. Pt aims to walk 15 min 2-4x/week.  PERTINENT HISTORY:  significant history of multiple strokes - has undergone extensive PT for stroke rehabilitation and low back pain management including aquatic PT, which has provided some relief  10/27/21 Multifocal acute/subacute nonhemorrhagic infarcts involving the left MCA territory.  Previous injections:  September 2024 B/L SI joint injection - provided relief for 3-6 mos, will be scheduling repeat injections  June 2024 Rt greater trochanteric bursa  currently taking Lyrica and Baclofen for neuropathic pain History of cardiac cath  Has loop recorder  PAIN:  PAIN:  Are you having pain? Yes NPRS scale: 10/10  Pain location: bil LBP and SI joint pain Pain orientation: Right and Bilateral  PAIN TYPE: aching, dull, sharp, shooting back pains Pain description: intermittent  Aggravating  factors: laying down, bending Relieving factors: medications, sleep   PRECAUTIONS: None  RED FLAGS: None  WEIGHT BEARING RESTRICTIONS: No  FALLS:  Has patient fallen in last 6 months? No but has a fear of falling  LIVING ENVIRONMENT: Lives with: lives with roommates - is looking for housing Lives in: House/apartment Stairs: No Has following equipment at home: Single point cane but isn't using it lately - does better without it  OCCUPATION: disabled  PLOF: Independent with basic ADLs, Independent with household mobility without device, Independent with community mobility without device, and Independent with transfers  PATIENT GOALS: learn an aquatic and land based HEP to help manage pain and become less dependent on medications/injections  NEXT MD VISIT: 06/04/24 for bil SI injections  OBJECTIVE:  Note: Objective measures were completed at Evaluation unless otherwise noted.  DIAGNOSTIC FINDINGS:  MRI LUMBAR SPINE WITHOUT CONTRAST  L4-5: Moderate central narrowing of the thecal sac along with mild left and borderline right subarticular lateral recess stenosis due to central disc protrusion, disc bulge, and short pedicles.  PATIENT SURVEYS:  LEFS  Extreme difficulty/unable (0), Quite a bit of difficulty (1), Moderate difficulty (2), Little difficulty (3), No difficulty (4) Survey date:  05/28/24  Any of your usual work, housework or school activities 3  2. Usual hobbies, recreational or sporting activities 1  3. Getting into/out of the bath 2  4. Walking between rooms 2  5. Putting on socks/shoes 1  6. Squatting  1  7. Lifting an object, like a bag of groceries from the floor 1  8. Performing light activities around your home 2  9. Performing heavy activities around your home 1  10. Getting into/out of a car 1  11. Walking 2 blocks 1  12. Walking 1 mile 0  13. Going up/down 10 stairs (1 flight) 0  14. Standing for 1 hour 0  15.  sitting for 1 hour 2  16. Running on  even ground 0  17. Running on uneven ground 0  18. Making sharp turns while running fast 0  19. Hopping  0  20. Rolling over in bed 1  Score total:  19/80     COGNITION: Overall cognitive  status: Within functional limits for tasks assessed     SENSATION: Intermittent shooting pain with numbness into toes  MUSCLE LENGTH: Hamstrings: Right 60 with mild spasticity deg; Left 70 deg Thomas test: Right very tight can't get to 0 deg - flexed to 15 deg Right gluteals, piriformis limited 50% compared to [employer] and hip flexor on Rt signif limited  POSTURE: Rt pelvic inflare with Rt LE IR with in-toeing, some spasticity contributing  PALPATION: Signif TTP Rt>Lt SI joint Signif tension/hypertonicity in Rt adductors, hamstrings, proximal quads/hip flexors, hip internal rotators Significant tension and spasm present in trunk paraspinals thoracic to lumbar bil Hypomobile Rt>Lt SI joints with pain  LUMBAR ROM:   AROM eval  Flexion Braces hands on thighs and walks hands down to mid-thigh without lumbar reversal, signif pain  Extension NT  Right lateral flexion 75% limitated  Left lateral flexion 75% limitated  Right rotation 75% limitated  Left rotation 75% limitated   (Blank rows = not tested)  LOWER EXTREMITY ROM:    Rt hip ER 35 deg Rt hip ext to neutral - very tight hip flexors and quads   LOWER EXTREMITY MMT:    MMT Right eval Left eval  Hip flexion 3+ 4+  Hip extension 3+ 4  Hip abduction 3+ 4  Hip adduction 3+ 4  Hip internal rotation 4- 4  Hip external rotation 3+ 4  Knee flexion 3+ 4  Knee extension 4- 4  Ankle dorsiflexion 4 4  Ankle plantarflexion 4 4  Ankle inversion    Ankle eversion     (Blank rows = not tested)  LUMBAR SPECIAL TESTS:  SI Compression/distraction test: Positive and FABER test: Positive  FUNCTIONAL TESTS:  5 times sit to stand: 1:01.07 signif use of hands on chair, avoids hip hinge due to pain Timed up and go (TUG): 27.29 no AD :  32.28 sec no AD  GAIT: Distance walked: 30 feet Assistive device utilized: None Level of assistance: Modified independence Comments: Rt LE IR with in-toeing, short stance time on Rt, small stride length bil, lacks trunk rotation  TREATMENT DATE:  06/13/2024 NuStep 3 6 mins- PT present to discuss status Seated sciatic nerve glide x 10 Seated cat cow x 10 LTR with legs supported on red stability ball x 8 each direction Hooklying single knee to chest 2 x 20 sec bilateral  Seated purple ball press x 10 Seated alt hand and knee press with purple ball x 10 bilateral  Seated unilateral clam with yellow loop x 10 bilateral  Seated march with yellow loop x 20  Sit to stand with yellow loop x 10      [facility] Adult PT Treatment:                                             Date: 05/29/24 Pt seen for aquatic therapy today.  Treatment took place in water 3.5-4.75 ft in depth at the [facility] pool. Temp of water was 91.  Pt entered/exited the pool via stairs independently with bilat rail.  - reacquainting to aquatic therapy principles - UE on barbell walking backward - 3 laps; side stepping  - squatted rest  - side stepping with UE horz abdct/add -> add/abdct with short hollow noodles - forward / backward marching with row motion - UE on wall:   hip add/abdct x 10  -return to  walking with arm swing - forward/ backward and side stepping  - straddling noodle with UE support: cycling   Pt requires the buoyancy and hydrostatic pressure of water for support, and to offload joints by unweighting joint load by at least 50 % in navel deep water and by at least 75-80% in chest to neck deep water.  Viscosity of the water is needed for resistance of strengthening. Water current perturbations provides challenge to standing balance requiring increased core activation.                                                                                                                                 PATIENT EDUCATION:  Education details: [email or web address] Person educated: Patient Education method: Explanation Education comprehension: verbalized understanding  HOME EXERCISE PROGRAM: Access Code: [email or web address] URL: [email or web address] Date: 05/28/2024 Prepared by: Orvil Beuhring  Exercises - Hooklying Single Knee to Chest Stretch  - 1 x daily - 7 x weekly - 3 sets - 10 reps - Supine Posterior Pelvic Tilt  - 1 x daily - 7 x weekly - 3 sets - 10 reps - Supine Figure 4 Piriformis Stretch  - 1 x daily - 7 x weekly - 3 sets - 10 reps - Supine Piriformis Stretch with Foot on Ground  - 1 x daily - 7 x weekly - 3 sets - 10 reps - Sidelying Transversus Abdominis Bracing  - 1 x daily - 7 x weekly - 1 sets - 10 reps - 5 hold  ASSESSMENT:  CLINICAL IMPRESSION: Hava presents to first follow up appointment on land since evaluation. She verbalized semi-compliance with HEP exercises. Patient required minimal verbal cues for form correction. She verbalized a flare up of sciatic symptoms on her left side today and she walked into the clinic with a cane. Treatment session focused on core and functional strengthening. Overall, patient tolerated treatment session well and should progress well with skilled therapy.  From initial evaluation:  Patient is a 53 y.o. female with complex medical history who was seen today for physical therapy evaluation and treatment for chronic global back pain and Rt LE pain and weakness.  She had two falls on the same day in 2023 and was discovered to have had a stroke.  She is on disability and is in temporary housing arrangements.  Assessment today reveals significant restriction in Rt hemipelvis, hip and knee related to some hypertonicity/spasticity in Rt LE.  Pt has very restricted Rt>Lt SI joint mobility.  She performs transfers and ambulates slowly secondary to significant pain which she rates as 9-10/10.  Her times tests for 5xSTS, TUG, and support  that she is at high risk for falls.  Her LEFS score is 19/80 which is signif disability.  She is scheduled to get repeat injections into bil SI joints on 06/04/24 as these gave her several months of relief last year.  Pt has had aquatic PT in the past which was very  helpful. We discussed a hybrid plan of land-based and aquatic-based PT with more efforts to get into aquatic PT schedule on the front end of PT given her pain levels and functional mobility restrictions.  Pt will benefit from skilled PT to maximize her functional strength, mobility, gait and safety, along with educate her on aquatic and land based HEP to support gains made in PT.  OBJECTIVE IMPAIRMENTS: Abnormal gait, decreased activity tolerance, decreased coordination, decreased endurance, decreased mobility, difficulty walking, decreased ROM, decreased strength, hypomobility, increased muscle spasms, impaired flexibility, impaired sensation, impaired tone, improper body mechanics, postural dysfunction, and pain.   ACTIVITY LIMITATIONS: carrying, lifting, bending, sitting, standing, squatting, sleeping, transfers, bed mobility, bathing, dressing, hygiene/grooming, and locomotion level  PARTICIPATION LIMITATIONS: cleaning, laundry, driving, shopping, and community activity  PERSONAL FACTORS: 1-2 comorbidities: history of multiple strokes, cardiac history, on disability, time since onset are also affecting patient's functional outcome.   REHAB POTENTIAL: Excellent  CLINICAL DECISION MAKING: Evolving/moderate complexity  EVALUATION COMPLEXITY: Moderate   GOALS: Goals reviewed with patient? Yes  SHORT TERM GOALS: Target date: 06/25/24  Pt will be ind with initial HEP for both land and aquatic environments without exacerbation of pain Baseline: Goal status: INITIAL  2.  Pt will improve 5x STS to 45 sec or less using proper body mechanics and mod use of UE Baseline:  Goal status: INITIAL  3.  Pt will improve lumbar ROM to allow  greater ease for bending to don shoes/socks Baseline:  Goal status: INITIAL  4.  Pt will report improved pain to 7-8/10 with ADLs Baseline: 9-10/10 Goal status: INITIAL  5.  Pt will be able to perform aquatic ambulation for 10 min with brief rests as needed to work on cardiovascular endurance and functional ROM/strength of bil LE Baseline:  Goal status: INITIAL    LONG TERM GOALS: Target date: 07/23/24  Pt will be ind with advanced/final HEP for aquatic and land based HEP to maintain gains made in PT Baseline:  Goal status: INITIAL  2.  LEFS score to improve by at least 8 points to demo reduced limitations Baseline: 19/80 Goal status: INITIAL  3.  TUG test to improve to 22 sec or less to demo improved functional transfer and gait safety and mobility. Baseline: 27.29 Goal status: INITIAL  4.  Pt will improve Rt quad and hip flexor mobility to allow for full stride length of Lt LE for more efficient smooth gait Baseline:  Goal status: INITIAL  5.  Pt will perform in 25 sec or less to demo improved gait speed for community access. Baseline:  Goal status: INITIAL  6.  Pt will achieve at least 4/5 Rt LE strength to allow for improved stairs to access housing options with stairs. Baseline:  Goal status: INITIAL  PLAN:  PT FREQUENCY: 2x/week  PT DURATION: 8 weeks  PLANNED INTERVENTIONS: 97110-Therapeutic exercises, 97530- Therapeutic activity, 97112- Neuromuscular re-education, 97535- Self Care, [id number]- Manual therapy, [phone]- Gait training, [phone]- Aquatic Therapy, [phone]- Electrical stimulation (unattended), [phone]- Traction (mechanical), [phone] (1-2 muscles), [phone] (3+ muscles)- Dry Needling, Patient/Family education, Balance training, Stair training, Taping, Joint mobilization, Spinal mobilization, DME instructions, Cryotherapy, and Moist heat.  PLAN FOR NEXT SESSION: assess tolerance to treatment session; walking for endurance and LE mobility, Rt LE mobility into hip ext,  Rt knee flexion, hamstring stretching, Rt hip ER (Rt hemipelvis is IR with in-toeing - has some spasticity), land: review HEP, try gentle stretching of Rt hip toward ER, ext, knee flexion for anterior thigh and  hip stretching, gentle spine ROM, manual therapy, sit to stand  [employer], PT 06/13/24 4:21 PM

## 2024-06-18 ENCOUNTER — Encounter

## 2024-06-21 ENCOUNTER — Telehealth: Payer: Self-pay | Admitting: Physical Therapy

## 2024-06-21 ENCOUNTER — Ambulatory Visit: Admitting: Physical Therapy

## 2024-06-21 NOTE — Telephone Encounter (Signed)
 Patient called almost 30 mins after her appointment time. She was told she was past our grace period and she would not be able to be seen. She was reminded of her next scheduled appointment in aquatics.    Kristeen Sar, PT 06/21/24 3:58 PM

## 2024-06-26 ENCOUNTER — Encounter (HOSPITAL_BASED_OUTPATIENT_CLINIC_OR_DEPARTMENT_OTHER): Payer: Self-pay | Admitting: Physical Therapy

## 2024-06-26 ENCOUNTER — Ambulatory Visit (HOSPITAL_BASED_OUTPATIENT_CLINIC_OR_DEPARTMENT_OTHER): Attending: Pain Medicine | Admitting: Physical Therapy

## 2024-06-26 DIAGNOSIS — M533 Sacrococcygeal disorders, not elsewhere classified: Secondary | ICD-10-CM | POA: Diagnosis present

## 2024-06-26 DIAGNOSIS — M25551 Pain in right hip: Secondary | ICD-10-CM | POA: Insufficient documentation

## 2024-06-26 DIAGNOSIS — R2681 Unsteadiness on feet: Secondary | ICD-10-CM | POA: Diagnosis present

## 2024-06-26 DIAGNOSIS — M6281 Muscle weakness (generalized): Secondary | ICD-10-CM | POA: Diagnosis present

## 2024-06-26 NOTE — Therapy (Signed)
 OUTPATIENT PHYSICAL THERAPY THORACOLUMBAR TREATMENT   Patient Name: Megan Chaney MRN: [id number] DOB:[birth year], 53 y.o., female Today's Date: 06/26/2024  END OF SESSION:  PT End of Session - 06/26/24 1508     Visit Number 4    Date for PT Re-Evaluation 07/23/24    Authorization Type [employer] Approved 7 visits-05/28/2024-07/26/2024-auth# [id number]    Authorization Time Period -05/28/2024-07/26/2024    Authorization - Visit Number 4    Authorization - Number of Visits 7    Progress Note Due on Visit 10    PT Start Time 1455   pt arrived late to pool area   PT Stop Time 1530    PT Time Calculation (min) 35 min    Behavior During Therapy [facility] for tasks assessed/performed            Past Medical History:  Diagnosis Date   Anemia    Fibroid    GERD (gastroesophageal reflux disease)    Past Surgical History:  Procedure Laterality Date   BUBBLE STUDY  10/30/2021   Procedure: BUBBLE STUDY;  Surgeon: Delford Maude BROCKS, MD;  Location: [facility] ENDOSCOPY;  Service: Cardiovascular;;   LOOP RECORDER INSERTION N/A 10/30/2021   Procedure: LOOP RECORDER INSERTION;  Surgeon: Cindie Ole DASEN, MD;  Location: [facility] INVASIVE CV LAB;  Service: Cardiovascular;  Laterality: N/A;   MULTIPLE TOOTH EXTRACTIONS     NO PAST SURGERIES     TEE WITHOUT CARDIOVERSION N/A 10/30/2021   Procedure: TRANSESOPHAGEAL ECHOCARDIOGRAM (TEE);  Surgeon: Delford Maude BROCKS, MD;  Location: [facility] ENDOSCOPY;  Service: Cardiovascular;  Laterality: N/A;   Patient Active Problem List   Diagnosis Date Noted   Acute ischemic stroke (HCC) 10/26/2021   Infertility counseling 10/04/2019   Enlarged uterus 07/31/2018   Uterine leiomyoma 07/31/2018   DUB (dysfunctional uterine bleeding) 07/31/2018    PCP: Leita Blind, MD  REFERRING PROVIDER: Catherene Toribio Pac, MD  REFERRING DIAG: M53.3 (ICD-10-CM) - Sacrococcygeal disorders, not elsewhere classified  Rationale for Evaluation and Treatment: Rehabilitation  THERAPY DIAG:   Sacrococcygeal disorders, not elsewhere classified  Pain in right hip  Muscle weakness (generalized)  Unsteadiness on feet  ONSET DATE: chronic  SUBJECTIVE:                                                                                                                                                                                           SUBJECTIVE STATEMENT: Patient presents with increased tingling and sciatic nerve symptoms in her left leg today. Pt reports limited tolerance to land therapy last visit.   POOL ACCESS: currently none.   From initial evaluation:  Pt referred to [facility]  for aquatic therapy with option to transition to land-based therapy as tolerated.  Pt fell two times on the same day in 2023 and was discovered to have sustained a stroke that day. History of multiple strokes and has Rt > Lt chronic global back pain (head to low back) which she attributes to the falls, sacroiliac pain bil and Rt hip pain.  Has done extensive aquatic PT in past which really helped.  Injections in 2024 helped for about 6 mos.  Will be getting repeat SI injections 06/04/24.  Is on disability. Pt aims to walk 15 min 2-4x/week.  PERTINENT HISTORY:  significant history of multiple strokes - has undergone extensive PT for stroke rehabilitation and low back pain management including aquatic PT, which has provided some relief  10/27/21 Multifocal acute/subacute nonhemorrhagic infarcts involving the left MCA territory.  Previous injections:  September 2024 B/L SI joint injection - provided relief for 3-6 mos, will be scheduling repeat injections  June 2024 Rt greater trochanteric bursa  currently taking Lyrica and Baclofen for neuropathic pain History of cardiac cath  Has loop recorder  PAIN:  PAIN:  Are you having pain? Yes NPRS scale: 8/10  Pain location: bil LBP and SI joint pain down into LLE PAIN TYPE: aching, dull, sharp, shooting back pains Pain description: intermittent   Aggravating factors: laying down, bending Relieving factors: medications, sleep   PRECAUTIONS: None  RED FLAGS: None  WEIGHT BEARING RESTRICTIONS: No  FALLS:  Has patient fallen in last 6 months? No but has a fear of falling  LIVING ENVIRONMENT: Lives with: lives with roommates - is looking for housing Lives in: House/apartment Stairs: No Has following equipment at home: Single point cane but isn't using it lately - does better without it  OCCUPATION: disabled  PLOF: Independent with basic ADLs, Independent with household mobility without device, Independent with community mobility without device, and Independent with transfers  PATIENT GOALS: learn an aquatic and land based HEP to help manage pain and become less dependent on medications/injections  NEXT MD VISIT: 06/04/24 for bil SI injections  OBJECTIVE:  Note: Objective measures were completed at Evaluation unless otherwise noted.  DIAGNOSTIC FINDINGS:  MRI LUMBAR SPINE WITHOUT CONTRAST  L4-5: Moderate central narrowing of the thecal sac along with mild left and borderline right subarticular lateral recess stenosis due to central disc protrusion, disc bulge, and short pedicles.  PATIENT SURVEYS:  LEFS  Extreme difficulty/unable (0), Quite a bit of difficulty (1), Moderate difficulty (2), Little difficulty (3), No difficulty (4) Survey date:  05/28/24  Any of your usual work, housework or school activities 3  2. Usual hobbies, recreational or sporting activities 1  3. Getting into/out of the bath 2  4. Walking between rooms 2  5. Putting on socks/shoes 1  6. Squatting  1  7. Lifting an object, like a bag of groceries from the floor 1  8. Performing light activities around your home 2  9. Performing heavy activities around your home 1  10. Getting into/out of a car 1  11. Walking 2 blocks 1  12. Walking 1 mile 0  13. Going up/down 10 stairs (1 flight) 0  14. Standing for 1 hour 0  15.  sitting for 1 hour 2   16. Running on even ground 0  17. Running on uneven ground 0  18. Making sharp turns while running fast 0  19. Hopping  0  20. Rolling over in bed 1  Score total:  19/80  COGNITION: Overall cognitive status: Within functional limits for tasks assessed     SENSATION: Intermittent shooting pain with numbness into toes  MUSCLE LENGTH: Hamstrings: Right 60 with mild spasticity deg; Left 70 deg Thomas test: Right very tight can't get to 0 deg - flexed to 15 deg Right gluteals, piriformis limited 50% compared to [employer] and hip flexor on Rt signif limited  POSTURE: Rt pelvic inflare with Rt LE IR with in-toeing, some spasticity contributing  PALPATION: Signif TTP Rt>Lt SI joint Signif tension/hypertonicity in Rt adductors, hamstrings, proximal quads/hip flexors, hip internal rotators Significant tension and spasm present in trunk paraspinals thoracic to lumbar bil Hypomobile Rt>Lt SI joints with pain  LUMBAR ROM:   AROM eval  Flexion Braces hands on thighs and walks hands down to mid-thigh without lumbar reversal, signif pain  Extension NT  Right lateral flexion 75% limitated  Left lateral flexion 75% limitated  Right rotation 75% limitated  Left rotation 75% limitated   (Blank rows = not tested)  LOWER EXTREMITY ROM:    Rt hip ER 35 deg Rt hip ext to neutral - very tight hip flexors and quads   LOWER EXTREMITY MMT:    MMT Right eval Left eval  Hip flexion 3+ 4+  Hip extension 3+ 4  Hip abduction 3+ 4  Hip adduction 3+ 4  Hip internal rotation 4- 4  Hip external rotation 3+ 4  Knee flexion 3+ 4  Knee extension 4- 4  Ankle dorsiflexion 4 4  Ankle plantarflexion 4 4  Ankle inversion    Ankle eversion     (Blank rows = not tested)  LUMBAR SPECIAL TESTS:  SI Compression/distraction test: Positive and FABER test: Positive  FUNCTIONAL TESTS:  5 times sit to stand: 1:01.07 signif use of hands on chair, avoids hip hinge due to pain Timed up and go (TUG):  27.29 no AD : 32.28 sec no AD  GAIT: Distance walked: 30 feet Assistive device utilized: None Level of assistance: Modified independence Comments: Rt LE IR with in-toeing, short stance time on Rt, small stride length bil, lacks trunk rotation  TREATMENT DATE:  [facility] Adult PT Treatment:                                             Date: 06/26/24 Pt seen for aquatic therapy today.  Treatment took place in water 3.5-4.75 ft in depth at the [facility] pool. Temp of water was 91.  Pt entered/exited the pool via stairs independently with bilat rail.  - UE on barbell walking forward/ backward - 3 laps;  - UE on barbell side stepping with cues for increased R step height -> with arm horz abdct/ add with yellow hand floats  - UE on wall: leg swings into hip flexion/ extension x 8 each LE; alternating single leg clams x 5 each  - marching backward/forward with row motion with yellow hand floats  - squatted rest with UE on wall, bil clams - straddling noodle with UE support: cycling   06/13/2024 NuStep 3 6 mins- PT present to discuss status Seated sciatic nerve glide x 10 Seated cat cow x 10 LTR with legs supported on red stability ball x 8 each direction Hooklying single knee to chest 2 x 20 sec bilateral  Seated purple ball press x 10 Seated alt hand and knee press with purple ball x 10  bilateral  Seated unilateral clam with yellow loop x 10 bilateral  Seated march with yellow loop x 20  Sit to stand with yellow loop x 10  [facility] Adult PT Treatment:                                             Date: 05/29/24 Pt seen for aquatic therapy today.  Treatment took place in water 3.5-4.75 ft in depth at the [facility] pool. Temp of water was 91.  Pt entered/exited the pool via stairs independently with bilat rail.  - reacquainting to aquatic therapy principles - UE on barbell walking backward - 3 laps; side stepping  - squatted rest  - side stepping with UE horz abdct/add ->  add/abdct with short hollow noodles - forward / backward marching with row motion - UE on wall:   hip add/abdct x 10  -return to walking with arm swing - forward/ backward and side stepping  - straddling noodle with UE support: cycling   Pt requires the buoyancy and hydrostatic pressure of water for support, and to offload joints by unweighting joint load by at least 50 % in navel deep water and by at least 75-80% in chest to neck deep water.  Viscosity of the water is needed for resistance of strengthening. Water current perturbations provides challenge to standing balance requiring increased core activation.                                                                                                                                PATIENT EDUCATION:  Education details: [email or web address] Person educated: Patient Education method: Explanation Education comprehension: verbalized understanding  HOME EXERCISE PROGRAM: Access Code: [email or web address] URL: [email or web address] Date: 05/28/2024 Prepared by: Orvil Beuhring  Exercises - Hooklying Single Knee to Chest Stretch  - 1 x daily - 7 x weekly - 3 sets - 10 reps - Supine Posterior Pelvic Tilt  - 1 x daily - 7 x weekly - 3 sets - 10 reps - Supine Figure 4 Piriformis Stretch  - 1 x daily - 7 x weekly - 3 sets - 10 reps - Supine Piriformis Stretch with Foot on Ground  - 1 x daily - 7 x weekly - 3 sets - 10 reps - Sidelying Transversus Abdominis Bracing  - 1 x daily - 7 x weekly - 1 sets - 10 reps - 5 hold  ASSESSMENT:  CLINICAL IMPRESSION: Ezmeralda reported reduction of LLE pain with single leg clam in 22ft water.  Overall pain reduced slightly by end of session, but numbness/tingling in leg remained unchanged.  Issued list of pools in area and discussed creating HEP for pt to transition to local pool for independent exercise at d/c. Therapist to check STG next visit as time allows.   From initial evaluation:  Patient is a 53  y.o.  female with complex medical history who was seen today for physical therapy evaluation and treatment for chronic global back pain and Rt LE pain and weakness.  She had two falls on the same day in 2023 and was discovered to have had a stroke.  She is on disability and is in temporary housing arrangements.  Assessment today reveals significant restriction in Rt hemipelvis, hip and knee related to some hypertonicity/spasticity in Rt LE.  Pt has very restricted Rt>Lt SI joint mobility.  She performs transfers and ambulates slowly secondary to significant pain which she rates as 9-10/10.  Her times tests for 5xSTS, TUG, and support that she is at high risk for falls.  Her LEFS score is 19/80 which is signif disability.  She is scheduled to get repeat injections into bil SI joints on 06/04/24 as these gave her several months of relief last year.  Pt has had aquatic PT in the past which was very helpful. We discussed a hybrid plan of land-based and aquatic-based PT with more efforts to get into aquatic PT schedule on the front end of PT given her pain levels and functional mobility restrictions.  Pt will benefit from skilled PT to maximize her functional strength, mobility, gait and safety, along with educate her on aquatic and land based HEP to support gains made in PT.  OBJECTIVE IMPAIRMENTS: Abnormal gait, decreased activity tolerance, decreased coordination, decreased endurance, decreased mobility, difficulty walking, decreased ROM, decreased strength, hypomobility, increased muscle spasms, impaired flexibility, impaired sensation, impaired tone, improper body mechanics, postural dysfunction, and pain.   ACTIVITY LIMITATIONS: carrying, lifting, bending, sitting, standing, squatting, sleeping, transfers, bed mobility, bathing, dressing, hygiene/grooming, and locomotion level  PARTICIPATION LIMITATIONS: cleaning, laundry, driving, shopping, and community activity  PERSONAL FACTORS: 1-2 comorbidities:  history of multiple strokes, cardiac history, on disability, time since onset are also affecting patient's functional outcome.   REHAB POTENTIAL: Excellent  CLINICAL DECISION MAKING: Evolving/moderate complexity  EVALUATION COMPLEXITY: Moderate   GOALS: Goals reviewed with patient? Yes  SHORT TERM GOALS: Target date: 06/25/24  Pt will be ind with initial HEP for both land and aquatic environments without exacerbation of pain Baseline: Goal status: INITIAL  2.  Pt will improve 5x STS to 45 sec or less using proper body mechanics and mod use of UE Baseline:  Goal status: INITIAL  3.  Pt will improve lumbar ROM to allow greater ease for bending to don shoes/socks Baseline:  Goal status: INITIAL  4.  Pt will report improved pain to 7-8/10 with ADLs Baseline: 9-10/10 Goal status: INITIAL  5.  Pt will be able to perform aquatic ambulation for 10 min with brief rests as needed to work on cardiovascular endurance and functional ROM/strength of bil LE Baseline:  Goal status: INITIAL    LONG TERM GOALS: Target date: 07/23/24  Pt will be ind with advanced/final HEP for aquatic and land based HEP to maintain gains made in PT Baseline:  Goal status: INITIAL  2.  LEFS score to improve by at least 8 points to demo reduced limitations Baseline: 19/80 Goal status: INITIAL  3.  TUG test to improve to 22 sec or less to demo improved functional transfer and gait safety and mobility. Baseline: 27.29 Goal status: INITIAL  4.  Pt will improve Rt quad and hip flexor mobility to allow for full stride length of Lt LE for more efficient smooth gait Baseline:  Goal status: INITIAL  5.  Pt will perform in 25 sec or less  to demo improved gait speed for community access. Baseline:  Goal status: INITIAL  6.  Pt will achieve at least 4/5 Rt LE strength to allow for improved stairs to access housing options with stairs. Baseline:  Goal status: INITIAL  PLAN:  PT FREQUENCY:  2x/week  PT DURATION: 8 weeks  PLANNED INTERVENTIONS: 97110-Therapeutic exercises, 97530- Therapeutic activity, 97112- Neuromuscular re-education, 97535- Self Care, [id number]- Manual therapy, [phone]- Gait training, [phone]- Aquatic Therapy, [phone]- Electrical stimulation (unattended), [phone]- Traction (mechanical), [phone] (1-2 muscles), [phone] (3+ muscles)- Dry Needling, Patient/Family education, Balance training, Stair training, Taping, Joint mobilization, Spinal mobilization, DME instructions, Cryotherapy, and Moist heat.  PLAN FOR NEXT SESSION: assess tolerance to treatment session; walking for endurance and LE mobility, Rt LE mobility into hip ext, Rt knee flexion, hamstring stretching, Rt hip ER (Rt hemipelvis is IR with in-toeing - has some spasticity), land: review HEP, try gentle stretching of Rt hip toward ER, ext, knee flexion for anterior thigh and hip stretching, gentle spine ROM, manual therapy, sit to stand   Delon Aquas, PTA 06/26/24 5:37 PM [facility] [address] [place], [place], [address] Phone: [phone]   Fax:  [phone]

## 2024-06-28 ENCOUNTER — Ambulatory Visit (HOSPITAL_BASED_OUTPATIENT_CLINIC_OR_DEPARTMENT_OTHER): Admitting: Physical Therapy

## 2024-07-03 ENCOUNTER — Ambulatory Visit (HOSPITAL_BASED_OUTPATIENT_CLINIC_OR_DEPARTMENT_OTHER): Admitting: Physical Therapy
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
## 2024-07-05 ENCOUNTER — Ambulatory Visit (HOSPITAL_BASED_OUTPATIENT_CLINIC_OR_DEPARTMENT_OTHER): Admitting: Physical Therapy

## 2024-07-08 ENCOUNTER — Emergency Department (HOSPITAL_COMMUNITY)

## 2024-07-08 ENCOUNTER — Observation Stay (HOSPITAL_COMMUNITY)
Admission: EM | Admit: 2024-07-08 | Discharge: 2024-07-09 | Disposition: A | Discharge status: Home / Self Care | Attending: Internal Medicine | Admitting: Internal Medicine

## 2024-07-08 ENCOUNTER — Other Ambulatory Visit: Payer: Self-pay

## 2024-07-08 ENCOUNTER — Encounter (HOSPITAL_COMMUNITY): Payer: Self-pay

## 2024-07-08 ENCOUNTER — Observation Stay (HOSPITAL_COMMUNITY)

## 2024-07-08 DIAGNOSIS — Z8673 Personal history of transient ischemic attack (TIA), and cerebral infarction without residual deficits: Secondary | ICD-10-CM | POA: Diagnosis not present

## 2024-07-08 DIAGNOSIS — Q2112 Patent foramen ovale: Secondary | ICD-10-CM | POA: Diagnosis not present

## 2024-07-08 DIAGNOSIS — Z91040 Latex allergy status: Secondary | ICD-10-CM | POA: Insufficient documentation

## 2024-07-08 DIAGNOSIS — N939 Abnormal uterine and vaginal bleeding, unspecified: Principal | ICD-10-CM

## 2024-07-08 DIAGNOSIS — Z79899 Other long term (current) drug therapy: Secondary | ICD-10-CM | POA: Insufficient documentation

## 2024-07-08 DIAGNOSIS — F1090 Alcohol use, unspecified, uncomplicated: Secondary | ICD-10-CM | POA: Insufficient documentation

## 2024-07-08 DIAGNOSIS — D259 Leiomyoma of uterus, unspecified: Secondary | ICD-10-CM | POA: Diagnosis present

## 2024-07-08 DIAGNOSIS — Z86718 Personal history of other venous thrombosis and embolism: Secondary | ICD-10-CM | POA: Insufficient documentation

## 2024-07-08 DIAGNOSIS — I69959 Hemiplegia and hemiparesis following unspecified cerebrovascular disease affecting unspecified side: Secondary | ICD-10-CM | POA: Insufficient documentation

## 2024-07-08 DIAGNOSIS — E785 Hyperlipidemia, unspecified: Secondary | ICD-10-CM | POA: Diagnosis not present

## 2024-07-08 DIAGNOSIS — N938 Other specified abnormal uterine and vaginal bleeding: Secondary | ICD-10-CM | POA: Diagnosis present

## 2024-07-08 DIAGNOSIS — F419 Anxiety disorder, unspecified: Secondary | ICD-10-CM | POA: Diagnosis present

## 2024-07-08 DIAGNOSIS — Z87891 Personal history of nicotine dependence: Secondary | ICD-10-CM | POA: Insufficient documentation

## 2024-07-08 DIAGNOSIS — Z7982 Long term (current) use of aspirin: Secondary | ICD-10-CM | POA: Diagnosis not present

## 2024-07-08 DIAGNOSIS — D649 Anemia, unspecified: Secondary | ICD-10-CM | POA: Diagnosis present

## 2024-07-08 LAB — CBC
HCT: 17 % — ABNORMAL LOW (ref 36.0–46.0)
Hemoglobin: 4.9 g/dL — CL (ref 12.0–15.0)
MCH: 24.1 pg — ABNORMAL LOW (ref 26.0–34.0)
MCHC: 28.8 g/dL — ABNORMAL LOW (ref 30.0–36.0)
MCV: 83.7 fL (ref 80.0–100.0)
Platelets: 295 K/uL (ref 150–400)
RBC: 2.03 MIL/uL — ABNORMAL LOW (ref 3.87–5.11)
RDW: 16.7 % — ABNORMAL HIGH (ref 11.5–15.5)
WBC: 7.2 K/uL (ref 4.0–10.5)
nRBC: 0 % (ref 0.0–0.2)

## 2024-07-08 LAB — PREPARE RBC (CROSSMATCH)

## 2024-07-08 LAB — COMPREHENSIVE METABOLIC PANEL WITH GFR
ALT: 15 U/L (ref 0–44)
AST: 19 U/L (ref 15–41)
Albumin: 3.2 g/dL — ABNORMAL LOW (ref 3.5–5.0)
Alkaline Phosphatase: 54 U/L (ref 38–126)
Anion gap: 10 (ref 5–15)
BUN: 12 mg/dL (ref 6–20)
CO2: 21 mmol/L — ABNORMAL LOW (ref 22–32)
Calcium: 8.4 mg/dL — ABNORMAL LOW (ref 8.9–10.3)
Chloride: 106 mmol/L (ref 98–111)
Creatinine, Ser: 0.63 mg/dL (ref 0.44–1.00)
GFR, Estimated: >60 mL/min (ref >60)
Glucose, Bld: 151 mg/dL — ABNORMAL HIGH (ref 70–99)
Potassium: 3.3 mmol/L — ABNORMAL LOW (ref 3.5–5.1)
Sodium: 137 mmol/L (ref 135–145)
Total Bilirubin: 0.3 mg/dL (ref 0.0–1.2)
Total Protein: 5.9 g/dL — ABNORMAL LOW (ref 6.5–8.1)

## 2024-07-08 LAB — I-STAT CHEM 8, ED
BUN: 12 mg/dL (ref 6–20)
Calcium, Ion: 1.08 mmol/L — ABNORMAL LOW (ref 1.15–1.40)
Chloride: 105 mmol/L (ref 98–111)
Creatinine, Ser: 0.6 mg/dL (ref 0.44–1.00)
Glucose, Bld: 154 mg/dL — ABNORMAL HIGH (ref 70–99)
HCT: 16 % — ABNORMAL LOW (ref 36.0–46.0)
Hemoglobin: 5.4 g/dL — CL (ref 12.0–15.0)
Potassium: 3.4 mmol/L — ABNORMAL LOW (ref 3.5–5.1)
Sodium: 139 mmol/L (ref 135–145)
TCO2: 20 mmol/L — ABNORMAL LOW (ref 22–32)

## 2024-07-08 MED ORDER — ACETAMINOPHEN 325 MG PO TABS: 650.0000 mg | ORAL_TABLET | Freq: Four times a day (QID) | ORAL | Status: DC | PRN

## 2024-07-08 MED ORDER — POLYETHYLENE GLYCOL 3350 17 G PO PACK: 17.0000 g | PACK | Freq: Every day | ORAL | Status: DC | PRN

## 2024-07-08 MED ORDER — METOPROLOL SUCCINATE ER 25 MG PO TB24: 12.5000 mg | ORAL_TABLET | Freq: Every day | ORAL | Status: DC

## 2024-07-08 MED ORDER — FERROUS SULFATE 325 (65 FE) MG PO TABS: 325.0000 mg | ORAL_TABLET | Freq: Every day | ORAL | Status: DC

## 2024-07-08 MED ORDER — MEGESTROL ACETATE 40 MG PO TABS
40.0000 mg | ORAL_TABLET | Freq: Two times a day (BID) | ORAL | Status: DC
  Administered 2024-07-08: 40 mg via ORAL
  Filled 2024-07-08 (×2): qty 1

## 2024-07-08 MED ORDER — ACETAMINOPHEN 650 MG RE SUPP: 650.0000 mg | Freq: Four times a day (QID) | RECTAL | Status: DC | PRN

## 2024-07-08 MED ORDER — MEGESTROL ACETATE 40 MG PO TABS: 40.0000 mg | ORAL_TABLET | Freq: Two times a day (BID) | ORAL | Status: DC

## 2024-07-08 MED ORDER — FLUOXETINE HCL 20 MG PO CAPS: 40.0000 mg | ORAL_CAPSULE | Freq: Every day | ORAL | Status: DC

## 2024-07-08 MED ORDER — PREGABALIN 100 MG PO CAPS
100.0000 mg | ORAL_CAPSULE | Freq: Two times a day (BID) | ORAL | Status: DC
  Administered 2024-07-08: 100 mg via ORAL
  Filled 2024-07-08: qty 1

## 2024-07-08 MED ORDER — SODIUM CHLORIDE 0.9% IV SOLUTION: Freq: Once | INTRAVENOUS | Status: DC

## 2024-07-08 MED ORDER — MEGESTROL ACETATE 40 MG PO TABS
40.0000 mg | ORAL_TABLET | Freq: Every day | ORAL | Status: DC
Start: 2024-07-08 — End: 2024-07-08
  Administered 2024-07-08: 40 mg via ORAL
  Filled 2024-07-08: qty 1

## 2024-07-08 MED ORDER — MEGESTROL ACETATE 40 MG PO TABS: 40.0000 mg | ORAL_TABLET | Freq: Every day | ORAL | Status: DC

## 2024-07-08 MED ORDER — SODIUM CHLORIDE 0.9 % IV BOLUS
1000.0000 mL | Freq: Once | INTRAVENOUS | Status: AC
  Administered 2024-07-08: 1000 mL via INTRAVENOUS

## 2024-07-08 MED ORDER — ATORVASTATIN CALCIUM 40 MG PO TABS: 40.0000 mg | ORAL_TABLET | Freq: Every day | ORAL | Status: DC

## 2024-07-08 MED ORDER — MEGESTROL ACETATE 40 MG PO TABS
40.0000 mg | ORAL_TABLET | Freq: Every day | ORAL | Status: DC
  Filled 2024-07-08: qty 1

## 2024-07-08 MED ORDER — SODIUM CHLORIDE 0.9% FLUSH
3.0000 mL | Freq: Two times a day (BID) | INTRAVENOUS | Status: DC
  Administered 2024-07-08 – 2024-07-09 (×2): 3 mL via INTRAVENOUS

## 2024-07-08 NOTE — ED Notes (Signed)
 Back from b/r, up to void, steady gait. Denies sx. Feel better. GYN MD in to see, at [facility].

## 2024-07-08 NOTE — ED Triage Notes (Signed)
 Pt states she is anemic and is supposed to get an iron  infusion tomorrow. Pt c.o vaginal bleeding since last month, weakness and dizziness.

## 2024-07-08 NOTE — ED Notes (Signed)
Remains in US.

## 2024-07-08 NOTE — ED Notes (Signed)
 Pelvic in progress, by GYN, chaperone present

## 2024-07-08 NOTE — ED Notes (Signed)
 EDP at [facility]

## 2024-07-08 NOTE — ED Notes (Signed)
 Alert, NAD, calm, interactive, no changes. VSS. US  notified, pt ready.

## 2024-07-08 NOTE — ED Notes (Addendum)
 VSS, intermittent anxiousness, elevated HR noted, returned to normal. Admitting MD at [facility]. VSS. Tolerating transfusion.

## 2024-07-08 NOTE — H&P (Addendum)
 History and Physical   Megan Chaney FMW:[id number] DOB: 1970/12/31 DOA: 07/08/2024  PCP: Stephane Leita DEL, MD   Patient coming from: Home  Chief Complaint: Vaginal bleeding, shortness of breath, weakness  HPI: Megan Chaney is a 53 y.o. female with medical history significant of hyperlipidemia, CVA, anxiety, leiomyoma, dysfunctional uterine bleeding, DVT, anemia, myofascial pain syndrome presenting with vaginal bleeding with associated shortness of breath and weakness.  Patient has had known history of leiomyoma.  Is followed with [facility].  She typically has her period monthly for 5 days.  But, her most recent period has been ongoing since August 20.  She reports she had some lighter bleeding for 2 weeks.    She did follow-up with her OB/GYN on 9/10.  For her bleeding their note mentions they have tried Megace  and Provera  in the past.  Currently has IUD.  At most recent visit IUD strings were not visible and with her ongoing bleeding and further enlarged uterus, at 18 weeks at that visit, they were planning for ultrasound to evaluate in October with immediate follow-up.  Patient bleeding has increased since her OB/GYN visit and she is now going through 1-2 pads per hour.  She has since developed associated shortness of breath and weakness and presented to the ED for further evaluation.  Denies fevers, chills, chest pain, constipation, diarrhea, nausea, vomiting  ED Course: Vital signs in the ED notable for heart rate in the 90s-130s, blood pressure in the 100s-120s systolic.  Lab workup notable for CMP with potassium 3.3, bicarb 21, glucose 151, calcium  8.4, protein 5.9, albumin 3.2.  CBC with hemoglobin of 4.9, unclear baseline, patient reports 7.  No recent CBC in our chart since a year ago when her hemoglobin was 11-13.  Patient has received 1 L IV fluids in the ED and 2 units PRBC transfusion has been ordered.  EDP is consulting OB/GYN here for further recommendations and  consultation as indicated.  Review of Systems: As per HPI otherwise all other systems reviewed and are negative.  Past Medical History:  Diagnosis Date   Acute deep vein thrombosis (DVT) of right upper extremity (HCC) 11/03/2021   Anemia    Fibroid    GERD (gastroesophageal reflux disease)     Past Surgical History:  Procedure Laterality Date   BUBBLE STUDY  10/30/2021   Procedure: BUBBLE STUDY;  Surgeon: Delford Maude BROCKS, MD;  Location: [facility] ENDOSCOPY;  Service: Cardiovascular;;   LOOP RECORDER INSERTION N/A 10/30/2021   Procedure: LOOP RECORDER INSERTION;  Surgeon: Cindie Ole DASEN, MD;  Location: [facility] INVASIVE CV LAB;  Service: Cardiovascular;  Laterality: N/A;   MULTIPLE TOOTH EXTRACTIONS     NO PAST SURGERIES     TEE WITHOUT CARDIOVERSION N/A 10/30/2021   Procedure: TRANSESOPHAGEAL ECHOCARDIOGRAM (TEE);  Surgeon: Delford Maude BROCKS, MD;  Location: [facility] ENDOSCOPY;  Service: Cardiovascular;  Laterality: N/A;    Social History  reports that she quit smoking about 24 years ago. Her smoking use included cigarettes. She has never used smokeless tobacco. She reports current alcohol use. She reports that she does not currently use drugs after having used the following drugs: Marijuana.  Allergies  Allergen Reactions   Latex Hives and Rash   Other Hives and Rash    Powder in latex gloves    Family History  Problem Relation Age of Onset   Chronic Renal Failure Father    Miscarriages / Stillbirths Father    Heart attack Mother    Stroke Mother  Cancer Mother    Diabetes Mother   Reviewed on admission  Prior to Admission medications   Medication Sig Start Date End Date Taking? Authorizing Provider  acetaminophen  (TYLENOL ) 325 MG tablet Take 2 tablets (650 mg total) by mouth every 4 (four) hours as needed for mild pain or headache (or temp > 37.5 C (99.5 F)). 11/03/21   Dickie Begun, MD  amitriptyline (ELAVIL) 10 MG tablet Take 10 mg by mouth at bedtime.    [provider]   apixaban  (ELIQUIS ) 5 MG TABS tablet Take 1 tablet (5 mg total) by mouth 2 (two) times daily. 11/03/21   Dickie Begun, MD  atorvastatin  (LIPITOR) 40 MG tablet Take 1 tablet (40 mg total) by mouth daily. 11/03/21   Dickie Begun, MD  atorvastatin  (LIPITOR) 80 MG tablet Take 80 mg by mouth daily.    [provider]  baclofen (LIORESAL) 10 MG tablet Take 10 mg by mouth every 6 (six) hours as needed for muscle spasms.    [provider]  calcium -vitamin D  (OSCAL 500/200 D-3) 500-200 MG-UNIT tablet Take 1 tablet by mouth 2 (two) times daily. 10/05/19   Fredirick Glenys RAMAN, MD  Cholecalciferol  (VITAMIN D3) 1.25 MG (50000 UT) CAPS Take 50,000 Units by mouth once a week. 07/20/21   [provider]  ferrous sulfate  325 (65 FE) MG tablet Take 325 mg by mouth daily with breakfast.    [provider]  megestrol  (MEGACE ) 40 MG tablet TAKE 1 TABLET BY MOUTH TWICE A DAY 09/06/21   Fredirick Glenys RAMAN, MD  metoprolol  succinate (TOPROL -XL) 25 MG 24 hr tablet Take 0.5 tablets (12.5 mg total) by mouth daily. 11/03/21   Dickie Begun, MD  pregabalin  (LYRICA ) 100 MG capsule Take 100 mg by mouth 2 (two) times daily.    [provider]  senna-docusate (SENOKOT-S) 8.6-50 MG tablet Take 1 tablet by mouth at bedtime. 11/03/21   Dickie Begun, MD    Physical Exam: Vitals:   07/08/24 1355 07/08/24 1400 07/08/24 1405 07/08/24 1406  BP:  115/70  115/70  Pulse: 99 92 92 91  Resp: 20 19 (!) 21 16  Temp:    98 F (36.7 C)  TempSrc:    Oral  SpO2: 100% 100% 100%     Physical Exam Constitutional:      General: She is not in acute distress.    Appearance: Normal appearance.     Comments: Well appearing, tearful at times.  HENT:     Head: Normocephalic and atraumatic.     Mouth/Throat:     Mouth: Mucous membranes are moist.     Pharynx: Oropharynx is clear.  Eyes:     Extraocular Movements: Extraocular movements intact.     Pupils: Pupils are equal, round, and reactive to light.   Cardiovascular:     Rate and Rhythm: Normal rate and regular rhythm.     Pulses: Normal pulses.     Heart sounds: Normal heart sounds.  Pulmonary:     Effort: Pulmonary effort is normal. No respiratory distress.     Breath sounds: Normal breath sounds.  Abdominal:     General: Bowel sounds are normal. There is no distension.     Palpations: Abdomen is soft.     Tenderness: There is no abdominal tenderness.  Musculoskeletal:        General: No swelling or deformity.  Skin:    General: Skin is warm and dry.  Neurological:     General: No focal deficit present.  Mental Status: Mental status is at baseline.    Labs on Admission: I have personally reviewed following labs and imaging studies  CBC: Recent Labs  Lab 07/08/24 1158 07/08/24 1221  WBC 7.2  --   HGB 4.9* 5.4*  HCT 17.0* 16.0*  MCV 83.7  --   PLT 295  --     Basic Metabolic Panel: Recent Labs  Lab 07/08/24 1158 07/08/24 1221  NA 137 139  K 3.3* 3.4*  CL 106 105  CO2 21*  --   GLUCOSE 151* 154*  BUN 12 12  CREATININE 0.63 0.60  CALCIUM  8.4*  --     GFR: CrCl cannot be calculated (Unknown ideal weight.).  Liver Function Tests: Recent Labs  Lab 07/08/24 1158  AST 19  ALT 15  ALKPHOS 54  BILITOT 0.3  PROT 5.9*  ALBUMIN 3.2*    Urine analysis:    Component Value Date/Time   COLORURINE YELLOW 07/28/2018 1328   APPEARANCEUR CLOUDY (A) 07/28/2018 1328   LABSPEC 1.024 07/28/2018 1328   PHURINE 7.0 07/28/2018 1328   GLUCOSEU NEGATIVE 07/28/2018 1328   HGBUR LARGE (A) 07/28/2018 1328   BILIRUBINUR NEGATIVE 07/28/2018 1328   KETONESUR NEGATIVE 07/28/2018 1328   PROTEINUR 100 (A) 07/28/2018 1328   UROBILINOGEN 0.2 01/27/2013 1542   NITRITE NEGATIVE 07/28/2018 1328   LEUKOCYTESUR TRACE (A) 07/28/2018 1328    Radiological Exams on Admission: No results found.  EKG: Independently reviewed. Sinus tachycardia at 129 beats minute.  Nonspecific T wave changes.  Assessment/Plan Principal  Problem:   Symptomatic anemia Active Problems:   Uterine leiomyoma   DUB (dysfunctional uterine bleeding)   History of CVA (cerebrovascular accident)   Anemia   Anxiety   Hyperlipidemia   Symptomatic anemia Dysfunctional uterine bleeding Uterine leiomyoma > Patient with known history of leiomyoma and dysfunctional uterine bleeding.  Follows with [facility]. > Remains on Megace .  Has also been on Provera  in the past currently has IUD. > At recent OB/GYN visit patient had light bleeding ongoing for couple weeks and uterus had enlarged from 16 weeks size to 18 weeks size.  IUD strings were not visible.  Outpatient ultrasound had been ordered. > Since this recent OB/GYN visit she had has had significant increase in bleeding now going through 1-2 pads per hour.  Developed shortness of breath and weakness. > In the ED found to have hemoglobin of 4.9 down from recent baseline of 7 per patient.  No chart results to cooperate this however given her minimal symptoms this is likely accurate. > 2 units ordered for transfusion in the ED.  EDP consulting OB/GYN for any further recommendations regarding imaging considering her significant increase in bleeding while on Megace  and with IUD in place but not visible. - Monitor on progressive for now - Appreciate OB/GYN recommendations and assistance - Continue with 2 units transfusion - Trend CBC - Supportive care  Hyperlipidemia - Continue atorvastatin   History of CVA - Holding ASA - Continue home atorvastatin   Anxiety - Continue home fluoxetine   History of DVT - No longer on Eliquis   DVT prophylaxis: SCDs Code Status:   Full Family Communication:  None on admission  Disposition Plan:   Patient is from:  Home  Anticipated DC to:  Home  Anticipated DC date:  1 to 5 days  Anticipated DC barriers: None  Consults called:  OB/GYN Admission status:  Observation, progressive  Severity of Illness: The appropriate patient status for this  patient is OBSERVATION. Observation status is judged  to be reasonable and necessary in order to provide the required intensity of service to ensure the patient's safety. The patient's presenting symptoms, physical exam findings, and initial radiographic and laboratory data in the context of their medical condition is felt to place them at decreased risk for further clinical deterioration. Furthermore, it is anticipated that the patient will be medically stable for discharge from the hospital within 2 midnights of admission.    Marsa KATHEE Scurry MD [facility]  How to contact the [facility] Attending or Consulting provider 7A - 7P or covering provider during after hours 7P -7A, for this patient?   Check the care team in [facility] and look for a) attending/consulting [facility] provider listed and b) the [facility] team listed Log into [email or web address] and use [facility]'s universal password to access. If you do not have the password, please contact the hospital operator. Locate the [facility] provider you are looking for under [facility] and page to a number that you can be directly reached. If you still have difficulty reaching the provider, please page the [facility] (Director on Call) for the Hospitalists listed on amion for assistance.  07/08/2024, 2:14 PM

## 2024-07-08 NOTE — ED Notes (Addendum)
 Up to b/r with assist, tolerated well, returned to stretcher/ room. Transport here to take pt to US . Blood transfusing. Denies sx at this time. Reports feel much better.

## 2024-07-08 NOTE — ED Provider Notes (Signed)
 [place] EMERGENCY DEPARTMENT AT [facility] Provider Note   CSN: [id number] Arrival date & time: 07/08/24  1142     Patient presents with: Vaginal Bleeding, Shortness of Breath, and Weakness   Megan Chaney is a 53 y.o. female patient who presents to the emergency department today for further evaluation of acute on chronic anemia and vaginal bleeding.  Patient normally hovers around 7 hemoglobin.  She states that she gets her period monthly and it is normally regular lasting for approximately 5 days.  However, last period that she has has been persistent since August 20.  For the first 2 weeks it was relatively light and typical for a normal period for her.  However, patient was seen evaluated at her OB/GYN and there was some thought that her IUD was out of place.  Patient was supposed to be scheduled for an ultrasound.  Since that visit, she is now bleeding through 1-2 pads every couple hours. Currently on Megace . Now she is having shortness of breath, generalized weakness, lightheadedness.  She has not passed out.  No chest pain, no abdominal pain.    Vaginal Bleeding Shortness of Breath Weakness Associated symptoms: shortness of breath        Prior to Admission medications   Medication Sig Start Date End Date Taking? Authorizing Provider  acetaminophen  (TYLENOL ) 325 MG tablet Take 2 tablets (650 mg total) by mouth every 4 (four) hours as needed for mild pain or headache (or temp > 37.5 C (99.5 F)). 11/03/21  Yes Dickie Begun, MD  amitriptyline (ELAVIL) 10 MG tablet Take 10 mg by mouth at bedtime.   Yes [provider]  aspirin  EC 81 MG tablet Take 81 mg by mouth daily. Swallow whole.   Yes [provider]  atorvastatin  (LIPITOR) 40 MG tablet Take 1 tablet (40 mg total) by mouth daily. 11/03/21  Yes Dickie Begun, MD  atorvastatin  (LIPITOR) 80 MG tablet Take 80 mg by mouth daily.   Yes [provider]  baclofen (LIORESAL) 10 MG tablet Take 10 mg  by mouth every 6 (six) hours as needed for muscle spasms.   Yes [provider]  calcium -vitamin D  (OSCAL 500/200 D-3) 500-200 MG-UNIT tablet Take 1 tablet by mouth 2 (two) times daily. 10/05/19  Yes Fredirick Glenys RAMAN, MD  ferrous sulfate  325 (65 FE) MG tablet Take 325 mg by mouth daily with breakfast.   Yes [provider]  FLUoxetine  (PROZAC ) 40 MG capsule Take 40 mg by mouth daily.   Yes [provider]  levonorgestrel (MIRENA) 20 MCG/DAY IUD 1 Application by Intrauterine route once. 05/06/22  Yes [provider]  senna-docusate (SENOKOT-S) 8.6-50 MG tablet Take 1 tablet by mouth at bedtime. Patient taking differently: Take 1 tablet by mouth at bedtime as needed for mild constipation or moderate constipation. 11/03/21  Yes Dickie Begun, MD  apixaban  (ELIQUIS ) 5 MG TABS tablet Take 1 tablet (5 mg total) by mouth 2 (two) times daily. Patient not taking: Reported on 07/08/2024 11/03/21   Dickie Begun, MD  Cholecalciferol  (VITAMIN D3) 1.25 MG (50000 UT) CAPS Take 50,000 Units by mouth once a week. Patient not taking: Reported on 07/08/2024 07/20/21   [provider]  megestrol  (MEGACE ) 40 MG tablet TAKE 1 TABLET BY MOUTH TWICE A DAY Patient not taking: Reported on 07/08/2024 09/06/21   Fredirick Glenys RAMAN, MD  metoprolol  succinate (TOPROL -XL) 25 MG 24 hr tablet Take 0.5 tablets (12.5 mg total) by mouth daily. 11/03/21   Dickie Begun,  MD  pregabalin  (LYRICA ) 100 MG capsule Take 100 mg by mouth 2 (two) times daily.    [provider]    Allergies: Latex and Other    Review of Systems  Respiratory:  Positive for shortness of breath.   Genitourinary:  Positive for vaginal bleeding.  Neurological:  Positive for weakness.  All other systems reviewed and are negative.   Updated Vital Signs BP 109/65   Pulse 99   Temp 98 F (36.7 C) (Oral)   Resp 16   SpO2 100%   Physical Exam Vitals and nursing note reviewed.  Constitutional:      General: She is not  in acute distress.    Appearance: Normal appearance.  HENT:     Head: Normocephalic and atraumatic.  Eyes:     General:        Right eye: No discharge.        Left eye: No discharge.     Comments: Pale conjunctivae bilaterally.   Cardiovascular:     Comments: Regular rate and rhythm.  S1/S2 are distinct without any evidence of murmur, rubs, or gallops.  Radial pulses are 2+ bilaterally.  Dorsalis pedis pulses are 2+ bilaterally.  No evidence of pedal edema. Pulmonary:     Comments: Clear to auscultation bilaterally.  Normal effort.  No respiratory distress.  No evidence of wheezes, rales, or rhonchi heard throughout. Abdominal:     General: Abdomen is flat. Bowel sounds are normal. There is no distension.     Tenderness: There is no abdominal tenderness. There is no guarding or rebound.  Musculoskeletal:        General: Normal range of motion.     Cervical back: Neck supple.  Skin:    General: Skin is warm and dry.     Findings: No rash.  Neurological:     General: No focal deficit present.     Mental Status: She is alert.  Psychiatric:        Mood and Affect: Mood normal.        Behavior: Behavior normal.     (all labs ordered are listed, but only abnormal results are displayed) Labs Reviewed  COMPREHENSIVE METABOLIC PANEL WITH GFR - Abnormal; Notable for the following components:      Result Value   Potassium 3.3 (*)    CO2 21 (*)    Glucose, Bld 151 (*)    Calcium  8.4 (*)    Total Protein 5.9 (*)    Albumin 3.2 (*)    All other components within normal limits  CBC - Abnormal; Notable for the following components:   RBC 2.03 (*)    Hemoglobin 4.9 (*)    HCT 17.0 (*)    MCH 24.1 (*)    MCHC 28.8 (*)    RDW 16.7 (*)    All other components within normal limits  I-STAT CHEM 8, ED - Abnormal; Notable for the following components:   Potassium 3.4 (*)    Glucose, Bld 154 (*)    Calcium , Ion 1.08 (*)    TCO2 20 (*)    Hemoglobin 5.4 (*)    HCT 16.0 (*)    All other  components within normal limits  HCG, QUANTITATIVE, PREGNANCY  HIV ANTIBODY (ROUTINE TESTING W REFLEX)  TYPE AND SCREEN  PREPARE RBC (CROSSMATCH)    EKG: EKG Interpretation Date/Time:  Sunday July 08 2024 11:54:53 EDT Ventricular Rate:  129 PR Interval:  128 QRS Duration:  80 QT Interval:  310  QTC Calculation: 454 R Axis:   73  Text Interpretation: Sinus tachycardia T wave abnormality, consider inferior ischemia Abnormal ECG When compared with ECG of 29-Oct-2021 16:25, PREVIOUS ECG IS PRESENT Confirmed by Dasie Faden ([id number]) on 07/08/2024 1:42:26 PM  Radiology: No results found.   .Critical Care  Performed by: Theotis Cameron HERO, PA-C Authorized by: Theotis Cameron HERO, PA-C   Critical care provider statement:    Critical care time (minutes):  35   Critical care time was exclusive of:  Separately billable procedures and treating other patients   Critical care was necessary to treat or prevent imminent or life-threatening deterioration of the following conditions:  Circulatory failure   Critical care was time spent personally by me on the following activities:  Blood draw for specimens, development of treatment plan with patient or surrogate, discussions with primary provider, ordering and performing treatments and interventions, ordering and review of laboratory studies, ordering and review of radiographic studies, pulse oximetry and re-evaluation of patient's condition   Care discussed with: admitting provider      Medications Ordered in the ED  0.9 %  sodium chloride  infusion (Manually program via Guardrails IV Fluids) (has no administration in time range)  0.9 %  sodium chloride  infusion (Manually program via Guardrails IV Fluids) (has no administration in time range)  atorvastatin  (LIPITOR) tablet 40 mg (has no administration in time range)  metoprolol  succinate (TOPROL -XL) 24 hr tablet 12.5 mg (has no administration in time range)  ferrous sulfate  tablet 325 mg (has  no administration in time range)  pregabalin  (LYRICA ) capsule 100 mg (has no administration in time range)  sodium chloride  flush (NS) 0.9 % injection 3 mL (has no administration in time range)  acetaminophen  (TYLENOL ) tablet 650 mg (has no administration in time range)    Or  acetaminophen  (TYLENOL ) suppository 650 mg (has no administration in time range)  polyethylene glycol (MIRALAX  / GLYCOLAX ) packet 17 g (has no administration in time range)  megestrol  (MEGACE ) tablet 40 mg (has no administration in time range)  FLUoxetine  (PROZAC ) capsule 40 mg (has no administration in time range)  sodium chloride  0.9 % bolus 1,000 mL (0 mLs Intravenous Stopped 07/08/24 1340)    Clinical Course as of 07/08/24 1509  Sun Jul 08, 2024  1412 I spoke with Dr. Seena with [facility] hospitalist who agrees to admit the patient but would like for me to contact OB/GYN for further recommendations. [CF]  1427 I spoke with Dr. Nicholaus with OB/GYN who recommends Megace  40.  She will consult on the patient. [CF]  1427 CBC(!!) [CF]    Clinical Course User Index [CF] Theotis Cameron HERO, PA-C    Medical Decision Making Megan Chaney is a 53 y.o. female patient who presents to the emergency department today for further evaluation of symptomatic anemia.  Patient normally at 7 but now at 4.9.  Likely secondary to prolonged vaginal bleeding.  Will go ahead and get the ultrasound here since it was not in the outpatient setting.  Vital signs are otherwise okay she is mildly tachycardic but normotensive.  2 units of blood running.  I spoke with OB/GYN who recommends Megace  40 to help with the bleeding.  Ultrasound still pending.  I spoke with Dr. Melvin with [facility] hospitalist who agrees to admit the patient.  Patient overall stable for admission at this time.   Amount and/or Complexity of Data Reviewed Labs: ordered. Decision-making details documented in ED Course. Radiology: ordered.  Risk Prescription drug  management. Decision regarding  hospitalization.     Final diagnoses:  Vaginal bleeding  Anemia, unspecified type    ED Discharge Orders     None          Theotis Cameron CHRISTELLA DEVONNA 07/08/24 1509    Dasie Faden, MD 07/10/24 1652

## 2024-07-08 NOTE — Consult Note (Signed)
 OB/GYN Consult Note  Referring Provider: Cameron Servant, PA  Megan Chaney is a 53 y.o. G0P0000  admitted for acute symptomatic anemia secondary to menorrhagia. OB/Gyn consulted for menorrhagia.   Patient reports she has had heavy bleeding for years, improved on megace  but she had weight gain so she switched to provera , then got an IUD 2 years ago and has had significant improvement with IUD. Still has periods lasting 5 days but they are much lighter and tolerable. She had an injection procedure in August and then 2 weeks later, started her period on August 20 and it just never stopped. She has her Gyn 07/04/24 and bleeding got worse after that, where she was soaking an overnight pad every hour. Came to ED because she was feeling terrible. Denies vaginal trauma or other potential causes for vaginal bleeding.  Per EMR, patient seen at [facility] on 07/04/24 for outpatient Gyn visit, complained of menorrhagia with enlarged fibroid uterus. Had IUD placed 04/2022 and scheduled for US  to visualize IUD as strings not visible on exam on 07/04/24, then discussion of options, including possible hysterectomy.      Past Medical History:  Diagnosis Date   Acute deep vein thrombosis (DVT) of right upper extremity (HCC) 11/03/2021   Anemia    Fibroid    GERD (gastroesophageal reflux disease)     Past Surgical History:  Procedure Laterality Date   BUBBLE STUDY  10/30/2021   Procedure: BUBBLE STUDY;  Surgeon: Delford Maude BROCKS, MD;  Location: [facility] ENDOSCOPY;  Service: Cardiovascular;;   LOOP RECORDER INSERTION N/A 10/30/2021   Procedure: LOOP RECORDER INSERTION;  Surgeon: Cindie Ole DASEN, MD;  Location: [facility] INVASIVE CV LAB;  Service: Cardiovascular;  Laterality: N/A;   MULTIPLE TOOTH EXTRACTIONS     NO PAST SURGERIES     TEE WITHOUT CARDIOVERSION N/A 10/30/2021   Procedure: TRANSESOPHAGEAL ECHOCARDIOGRAM (TEE);  Surgeon: Delford Maude BROCKS, MD;  Location: [facility] ENDOSCOPY;  Service: Cardiovascular;  Laterality: N/A;     OB History  Gravida Para Term Preterm AB Living  0 0 0 0 0 0  SAB IAB Ectopic Multiple Live Births  0 0 0 0 0    Social History   Socioeconomic History   Marital status: Single    Spouse name: Not on file   Number of children: Not on file   Years of education: Not on file   Highest education level: Not on file  Occupational History   Not on file  Tobacco Use   Smoking status: Former    Current packs/day: 0.00    Types: Cigarettes    Quit date: 07/29/1999    Years since quitting: 24.9   Smokeless tobacco: Never  Vaping Use   Vaping status: Never Used  Substance and Sexual Activity   Alcohol use: Yes   Drug use: Not Currently    Types: Marijuana    Comment: Quit in July 2019   Sexual activity: Yes    Birth control/protection: None  Other Topics Concern   Not on file  Social History Narrative   Not on file   Social Drivers of Health   Financial Resource Strain: Not on file  Food Insecurity: Not on file  Transportation Needs: Not on file  Physical Activity: Not on file  Stress: Not on file  Social Connections: Not on file    Family History  Problem Relation Age of Onset   Chronic Renal Failure Father    Miscarriages / Stillbirths Father    Heart attack Mother  Stroke Mother    Cancer Mother    Diabetes Mother     (Not in a hospital admission)   Allergies  Allergen Reactions   Latex Hives and Rash   Other Hives and Rash    Powder in latex gloves    Review of Systems: Negative except for what is mentioned in HPI.     Physical Exam: BP 115/70   Pulse 91   Temp 98 F (36.7 C) (Oral)   Resp 16   SpO2 100%  CONSTITUTIONAL: Well-developed, well-nourished female in no acute distress.  HENT:  Normocephalic, atraumatic, External right and left ear normal. Oropharynx is clear and moist EYES: Conjunctivae and EOM are normal. Pupils are equal, round, and reactive to light. No scleral icterus.  NECK: Normal range of motion, supple, no  masses SKIN: Skin is warm and dry. No rash noted. Not diaphoretic. No erythema. No pallor. NEUROLGIC: Alert and oriented to person, place, and time. Normal reflexes, muscle tone coordination. No cranial nerve deficit noted. PSYCHIATRIC: Normal mood and affect. Normal behavior. Normal judgment and thought content. CARDIOVASCULAR: Normal heart rate noted RESPIRATORY: Effort normal, no problems with respiration noted ABDOMEN: Soft, nontender, nondistended PELVIC: normal appearing external female genitalia, normal appearing vaginal mucosa with scant dark blood at cervical os MUSCULOSKELETAL: Normal range of motion. No edema and no tenderness.  Pelvic exam done with  chaperone present.   Pertinent Labs/Studies:   Results for orders placed or performed during the hospital encounter of 07/08/24 (from the past 72 hours)  Comprehensive metabolic panel     Status: Abnormal   Collection Time: 07/08/24 11:58 AM  Result Value Ref Range   Sodium 137 135 - 145 mmol/L   Potassium 3.3 (L) 3.5 - 5.1 mmol/L   Chloride 106 98 - 111 mmol/L   CO2 21 (L) 22 - 32 mmol/L   Glucose, Bld 151 (H) 70 - 99 mg/dL    Comment: Glucose reference range applies only to samples taken after fasting for at least 8 hours.   BUN 12 6 - 20 mg/dL   Creatinine, Ser 9.36 0.44 - 1.00 mg/dL   Calcium  8.4 (L) 8.9 - 10.3 mg/dL   Total Protein 5.9 (L) 6.5 - 8.1 g/dL   Albumin 3.2 (L) 3.5 - 5.0 g/dL   AST 19 15 - 41 U/L   ALT 15 0 - 44 U/L   Alkaline Phosphatase 54 38 - 126 U/L   Total Bilirubin 0.3 0.0 - 1.2 mg/dL   GFR, Estimated >39 >39 mL/min    Comment: (NOTE) Calculated using the CKD-EPI Creatinine Equation (2021)    Anion gap 10 5 - 15    Comment: Performed at [facility], [address] [place], [place] [address]  CBC     Status: Abnormal   Collection Time: 07/08/24 11:58 AM  Result Value Ref Range   WBC 7.2 4.0 - 10.5 K/uL   RBC 2.03 (L) 3.87 - 5.11 MIL/uL   Hemoglobin 4.9 (LL) 12.0 - 15.0 g/dL     Comment: REPEATED TO VERIFY This critical result has been called to T,SHROPSHIRE RN by Carola Mulberry on 07/08/2024 12:44:01, and has been read back.    HCT 17.0 (L) 36.0 - 46.0 %   MCV 83.7 80.0 - 100.0 fL   MCH 24.1 (L) 26.0 - 34.0 pg   MCHC 28.8 (L) 30.0 - 36.0 g/dL   RDW 83.2 (H) 88.4 - 84.4 %   Platelets 295 150 - 400 K/uL   nRBC  0.0 0.0 - 0.2 %    Comment: Performed at [facility], [address] [place], [place] [address]  Type and screen [facility]     Status: None (Preliminary result)   Collection Time: 07/08/24 12:10 PM  Result Value Ref Range   ABO/RH(D) O POS    Antibody Screen NEG    Sample Expiration 07/11/2024,2359    Unit Number [id number]    Blood Component Type RED CELLS,LR    Unit division 00    Status of Unit ISSUED    Transfusion Status OK TO TRANSFUSE    Crossmatch Result      Compatible Performed at [facility], [address], [place], [place] [address]    Unit Number [id number]    Blood Component Type RED CELLS,LR    Unit division 00    Status of Unit ALLOCATED    Transfusion Status OK TO TRANSFUSE    Crossmatch Result Compatible   I-stat chem 8, ED (not at [facility], [facility] or [facility])     Status: Abnormal   Collection Time: 07/08/24 12:21 PM  Result Value Ref Range   Sodium 139 135 - 145 mmol/L   Potassium 3.4 (L) 3.5 - 5.1 mmol/L   Chloride 105 98 - 111 mmol/L   BUN 12 6 - 20 mg/dL   Creatinine, Ser 9.39 0.44 - 1.00 mg/dL   Glucose, Bld 845 (H) 70 - 99 mg/dL    Comment: Glucose reference range applies only to samples taken after fasting for at least 8 hours.   Calcium , Ion 1.08 (L) 1.15 - 1.40 mmol/L   TCO2 20 (L) 22 - 32 mmol/L   Hemoglobin 5.4 (LL) 12.0 - 15.0 g/dL   HCT 83.9 (L) 63.9 - 53.9 %   Comment NOTIFIED PHYSICIAN   Prepare RBC (crossmatch)     Status: None   Collection Time: 07/08/24  1:04 PM  Result Value Ref Range   Order Confirmation      ORDER PROCESSED BY BLOOD BANK Performed at [facility], [address], [place], [place] [address]        Assessment and Plan :Stevi Hollinshead is a 53 y.o. G0P0000 admitted for acute symptomatic anemia secondary to menorrhagia. Patient has done well with Mirena IUD to manage bleeding until recently. Pelvic US  shows no definitive IUD but possible shadow within cervical canal, unable to see strings on exam so it is possible the IUD has come out.   Recommend starting megace  40 mg BID to control bleeding acutely and follow up with her gyn as outpatient to discuss hysteroscopy for IUD removal/placement, potential hysterectomy. She is fine with the plan as bleeding has already lessened in ED since she got first megace  dose. She is also feeling significantly better with blood transfusion. Reviewed options for management for bleeding, including replacing IUD and surgery at her request. Will have her follow up with outpatient gyn.   Thank you for this consult, we will sign off, please call or re-consult with further questions.   For OB/GYN questions, please call the Center for [facility] Monday - Friday, 8 am - 5 pm: [phone] All other times: [phone]    K. Yolanda Moats, M.D. Attending Obstetrician & Gynecologist, [facility] for [employer], [facility]

## 2024-07-08 NOTE — ED Notes (Signed)
 Pt moved from w/c to stretcher. C/o fatigue, general weakness, dizziness, clammy, mildly sob. Skin pale. IVF infusing. VSS. Alert, NAD, calm, interactive. Nausea resolved. Denies pain.

## 2024-07-08 NOTE — ED Provider Triage Note (Signed)
 Emergency Medicine Provider Triage Evaluation Note  Megan Chaney , a 53 y.o. female  was evaluated in triage.  Pt complains of vaginal bleeding, weakness. Reports she has had bleeding for 1 month. Hx of anemia, was scheduled to have an iron  transfusion tomorrow. Reports some generalized lower abdominal pain as well. Reports she is supposed to have an ultrasound to looks for fibroids, also at her last OB/GYN appointment they could not find her IUD strings, so were planning to look at this with US  as well. Reports she has had numerous iron  transfusions in the past, no history requiring RBC transfusion before.   Review of Systems  Positive:  Negative:   Physical Exam  BP 114/69 (BP Location: Right Arm)   Pulse (!) 136   Temp 98.8 F (37.1 C)   Resp (!) 24   SpO2 100%  Gen:   Awake, no distress  unwell appearing Resp:  Normal effort  MSK:   Moves extremities without difficulty  Other:    Medical Decision Making  Medically screening exam initiated at 12:18 PM.  Appropriate orders placed.  Megan Chaney was informed that the remainder of the evaluation will be completed by another provider, this initial triage assessment does not replace that evaluation, and the importance of remaining in the ED until their evaluation is complete.    Nora Lauraine LABOR, PA-C 07/08/24 1230

## 2024-07-09 ENCOUNTER — Ambulatory Visit (INDEPENDENT_AMBULATORY_CARE_PROVIDER_SITE_OTHER)

## 2024-07-09 DIAGNOSIS — D259 Leiomyoma of uterus, unspecified: Secondary | ICD-10-CM | POA: Diagnosis not present

## 2024-07-09 DIAGNOSIS — I639 Cerebral infarction, unspecified: Secondary | ICD-10-CM

## 2024-07-09 DIAGNOSIS — Z8673 Personal history of transient ischemic attack (TIA), and cerebral infarction without residual deficits: Secondary | ICD-10-CM | POA: Diagnosis not present

## 2024-07-09 DIAGNOSIS — N938 Other specified abnormal uterine and vaginal bleeding: Secondary | ICD-10-CM | POA: Diagnosis not present

## 2024-07-09 DIAGNOSIS — Q2112 Patent foramen ovale: Secondary | ICD-10-CM

## 2024-07-09 DIAGNOSIS — D649 Anemia, unspecified: Secondary | ICD-10-CM | POA: Diagnosis not present

## 2024-07-09 LAB — CBC
HCT: 24.9 % — ABNORMAL LOW (ref 36.0–46.0)
Hemoglobin: 8.1 g/dL — ABNORMAL LOW (ref 12.0–15.0)
MCH: 27.6 pg (ref 26.0–34.0)
MCHC: 32.5 g/dL (ref 30.0–36.0)
MCV: 84.7 fL (ref 80.0–100.0)
Platelets: 220 K/uL (ref 150–400)
RBC: 2.94 MIL/uL — ABNORMAL LOW (ref 3.87–5.11)
RDW: 15.4 % (ref 11.5–15.5)
WBC: 7 K/uL (ref 4.0–10.5)
nRBC: 0 % (ref 0.0–0.2)

## 2024-07-09 LAB — TYPE AND SCREEN
ABO/RH(D): O POS
Antibody Screen: NEGATIVE
Unit division: 0
Unit division: 0

## 2024-07-09 LAB — BPAM RBC
Blood Product Expiration Date: 202510112359
Blood Product Expiration Date: 202510112359
ISSUE DATE / TIME: 202509141338
ISSUE DATE / TIME: 202509141648
Unit Type and Rh: 5100
Unit Type and Rh: 5100

## 2024-07-09 LAB — COMPREHENSIVE METABOLIC PANEL WITH GFR
ALT: 14 U/L (ref 0–44)
AST: 17 U/L (ref 15–41)
Albumin: 2.9 g/dL — ABNORMAL LOW (ref 3.5–5.0)
Alkaline Phosphatase: 48 U/L (ref 38–126)
Anion gap: 8 (ref 5–15)
BUN: 8 mg/dL (ref 6–20)
CO2: 22 mmol/L (ref 22–32)
Calcium: 8.2 mg/dL — ABNORMAL LOW (ref 8.9–10.3)
Chloride: 109 mmol/L (ref 98–111)
Creatinine, Ser: 0.63 mg/dL (ref 0.44–1.00)
GFR, Estimated: >60 mL/min (ref >60)
Glucose, Bld: 97 mg/dL (ref 70–99)
Potassium: 3.6 mmol/L (ref 3.5–5.1)
Sodium: 139 mmol/L (ref 135–145)
Total Bilirubin: 0.7 mg/dL (ref 0.0–1.2)
Total Protein: 5.4 g/dL — ABNORMAL LOW (ref 6.5–8.1)

## 2024-07-09 LAB — HEMOGLOBIN AND HEMATOCRIT, BLOOD
HCT: 25.5 % — ABNORMAL LOW (ref 36.0–46.0)
Hemoglobin: 8.2 g/dL — ABNORMAL LOW (ref 12.0–15.0)

## 2024-07-09 LAB — HIV ANTIBODY (ROUTINE TESTING W REFLEX): HIV Screen 4th Generation wRfx: NONREACTIVE

## 2024-07-09 LAB — HCG, QUANTITATIVE, PREGNANCY: hCG, Beta Chain, Quant, S: <1 m[IU]/mL (ref <5)

## 2024-07-09 MED ORDER — FERROUS SULFATE 325 (65 FE) MG PO TABS: 325.0000 mg | ORAL_TABLET | Freq: Every day | ORAL | 0 refills | Status: DC

## 2024-07-09 MED ORDER — MEGESTROL ACETATE 40 MG PO TABS: 40.0000 mg | ORAL_TABLET | Freq: Two times a day (BID) | ORAL | 0 refills | Status: AC

## 2024-07-09 NOTE — Assessment & Plan Note (Addendum)
 07-09-2024 pt seen by OB/GYN consult. Recommended megace  40 mg bid. F/u with [facility] for definitive treatment of DUB.  Long term megace  not recommended due to pt's prior hx of cryptogenic CVA with patent foramen ovale.

## 2024-07-09 NOTE — Assessment & Plan Note (Addendum)
 07-09-2024 seen on Transesophageal echo 10-30-2021. Small PFO with positive bubble study. Pt on daily ASA at home. Will need to hold ASA due to recent menorrhagia causing significant anemia with admission HgB of 4.9 g/dl.  Pt needs emergent f/u with outpatient [facility] Dr. Lang for further treatment of her DUB. ASA therapy should be as soon as possible after her DUB is controlled to prevent further risk of stroke. PCP should consider outpatient referral to cardiology for PFO closure if DUB continues to be a issue and pt cannot take ASA.

## 2024-07-09 NOTE — Progress Notes (Signed)
 PROGRESS NOTE    Megan Chaney  FMW:[id number] DOB: [birth year] DOA: 07/08/2024 PCP: Stephane Leita DEL, MD  Subjective: Pt seen and examined. Feels better after 2 units PRBC. HgB up to 8.1 g/dl today. Ready for DC. Seen by OB/GYN. Vaginal bleeding has slowed down. Now on megace  40 mg bid.   Hospital Course: CC: anemia, vaginal bleeding HPI: Megan Chaney is a 53 y.o. female with medical history significant of hyperlipidemia, CVA, anxiety, leiomyoma, dysfunctional uterine bleeding, DVT, anemia, myofascial pain syndrome presenting with vaginal bleeding with associated shortness of breath and weakness.   Patient has had known history of leiomyoma.  Is followed with [facility].  She typically has her period monthly for 5 days.  But, her most recent period has been ongoing since August 20.  She reports she had some lighter bleeding for 2 weeks.     She did follow-up with her OB/GYN on 9/10.  For her bleeding their note mentions they have tried Megace  and Provera  in the past.  Currently has IUD.  At most recent visit IUD strings were not visible and with her ongoing bleeding and further enlarged uterus, at 18 weeks at that visit, they were planning for ultrasound to evaluate in October with immediate follow-up.   Patient bleeding has increased since her OB/GYN visit and she is now going through 1-2 pads per hour.  She has since developed associated shortness of breath and weakness and presented to the ED for further evaluation.   Denies fevers, chills, chest pain, constipation, diarrhea, nausea, vomiting   ED Course: Vital signs in the ED notable for heart rate in the 90s-130s, blood pressure in the 100s-120s systolic.   Lab workup notable for CMP with potassium 3.3, bicarb 21, glucose 151, calcium  8.4, protein 5.9, albumin 3.2.  CBC with hemoglobin of 4.9, unclear baseline, patient reports 7.  No recent CBC in our chart since a year ago when her hemoglobin was 11-13.   Patient has received 1 L  IV fluids in the ED and 2 units PRBC transfusion has been ordered.   EDP is consulting OB/GYN here for further recommendations and consultation as indicated.  Significant Events: Admitted 07/08/2024 for symptomatic anemia   Admission Labs: WBC 7.2, HgB 4.9, plt 295 Na 137, K 3.3, CO2 of 21, BUN 12, Scr 0.63, glu 151 T prot 5.9, alb 3.2, AST 19, ALT 15, alk phos 54, t. Bili 0.3  Admission Imaging Studies: Pelvic U/S Enlarged heterogeneous uterus with several fibroids. 2. Nonvisualization of the endometrium. Distorted appearance of the endometrium due to mass effect by anterior body fibroid. 3. Linear echogenic area within the cervix may represent displaced IUD. Direct visualization is recommended. 4. Left ovarian cyst measures up to 3.8 cm. Follow-up with ultrasound in 3-6 months recommended.  Significant Labs:   Significant Imaging Studies:   Antibiotic Therapy: Anti-infectives (From admission, onward)    None       Procedures: PRBC transfusion of 2 units  Consultants: OB/SYN    Assessment and Plan: * Symptomatic anemia 07-09-2024 s/p 2 unit PRBC transfusion. Admission HgB of 4.9 g/dl. Post-transfusion 8.1 g/dl.  DUB (dysfunctional uterine bleeding) 07-09-2024 pt seen by OB/GYN consult. Recommended megace  40 mg bid. F/u with [facility] for definitive treatment of DUB.  Long term megace  not recommended due to pt's prior hx of cryptogenic CVA with patent foramen ovale.  Uterine leiomyoma 07-09-2024 seen on pelvic U/S yesterday. F/u with [facility]. Pt's IUD may be malposition. F/u with [facility] for removal  and reinsertion.  Patent foramen ovale 07-09-2024 seen on Transesophageal echo 10-30-2021. Small PFO with positive bubble study. Pt on daily ASA at home. Will need to hold ASA due to recent menorrhagia causing significant anemia with admission HgB of 4.9 g/dl.  Pt needs emergent f/u with outpatient [facility] Dr. Lang for further treatment of her DUB.  ASA therapy should be as soon as possible after her DUB is controlled to prevent further risk of stroke. PCP should consider outpatient referral to cardiology for PFO closure if DUB continues to be a issue and pt cannot take ASA.  Hyperlipidemia 07-09-2024 on lipitor 40 mg daily.  Anxiety 07-09-2024 stable  History of CVA (cerebrovascular accident) 07-09-2024 prior hx of cryptogenic CVA. Has patent foramen ovale. Long term megace  therapy not recommended.  DVT prophylaxis: SCDs Start: 07/08/24 1413    Code Status: Full Code Family Communication: no family at bedside. Pt is decisional. Disposition Plan: return home Reason for continuing need for hospitalization: stable for DC to home.  Objective: Vitals:   07/09/24 0345 07/09/24 0415 07/09/24 0615 07/09/24 0630  BP: 128/87 117/87 (!) 136/91 131/79  Pulse: 69 81 80 71  Resp: 18 18 16 12   Temp:      TempSrc:      SpO2: 100% 100% 100% 100%    Intake/Output Summary (Last 24 hours) at 07/09/2024 0829 Last data filed at 07/08/2024 1904 Gross per 24 hour  Intake 2295 ml  Output --  Net 2295 ml   There were no vitals filed for this visit.  Examination:  Physical Exam Vitals and nursing note reviewed.  Constitutional:      General: She is not in acute distress.    Appearance: She is not toxic-appearing.  HENT:     Head: Normocephalic and atraumatic.  Cardiovascular:     Rate and Rhythm: Normal rate and regular rhythm.  Pulmonary:     Effort: Pulmonary effort is normal.     Breath sounds: Normal breath sounds.  Abdominal:     General: Abdomen is flat. Bowel sounds are normal.     Palpations: Abdomen is soft.  Musculoskeletal:     Right lower leg: No edema.     Left lower leg: No edema.  Skin:    General: Skin is warm and dry.     Capillary Refill: Capillary refill takes less than 2 seconds.  Neurological:     Mental Status: She is alert and oriented to person, place, and time.     Data Reviewed: I have personally  reviewed following labs and imaging studies  CBC: Recent Labs  Lab 07/08/24 1158 07/08/24 1221 07/08/24 2321 07/09/24 0340  WBC 7.2  --   --  7.0  HGB 4.9* 5.4* 8.2* 8.1*  HCT 17.0* 16.0* 25.5* 24.9*  MCV 83.7  --   --  84.7  PLT 295  --   --  220   Basic Metabolic Panel: Recent Labs  Lab 07/08/24 1158 07/08/24 1221 07/09/24 0340  NA 137 139 139  K 3.3* 3.4* 3.6  CL 106 105 109  CO2 21*  --  22  GLUCOSE 151* 154* 97  BUN 12 12 8   CREATININE 0.63 0.60 0.63  CALCIUM  8.4*  --  8.2*   GFR: CrCl cannot be calculated (Unknown ideal weight.). Liver Function Tests: Recent Labs  Lab 07/08/24 1158 07/09/24 0340  AST 19 17  ALT 15 14  ALKPHOS 54 48  BILITOT 0.3 0.7  PROT 5.9* 5.4*  ALBUMIN 3.2*  2.9*    Radiology Studies: US  PELVIC COMPLETE W TRANSVAGINAL AND TORSION R/O Result Date: 07/08/2024 CLINICAL DATA:  Vaginal bleeding. EXAM: TRANSABDOMINAL AND TRANSVAGINAL ULTRASOUND OF PELVIS DOPPLER ULTRASOUND OF OVARIES TECHNIQUE: Both transabdominal and transvaginal ultrasound examinations of the pelvis were performed. Transabdominal technique was performed for global imaging of the pelvis including uterus, ovaries, adnexal regions, and pelvic cul-de-sac. It was necessary to proceed with endovaginal exam following the transabdominal exam to visualize the endometrium and ovaries. Color and duplex Doppler ultrasound was utilized to evaluate blood flow to the ovaries. COMPARISON:  Ultrasound dated 08/02/2018. FINDINGS: Uterus Measurements: 12.4 x 7.3 x 8.2 cm = volume: 387 mL. The uterus is enlarged and heterogeneous with multiple fibroids including a 4.6 x 5.3 x 3.9 cm anterior body and a 5.2 x 3.7 x 3.8 cm posterior fundal fibroid. Endometrium The endometrium is not visualized and suboptimally evaluated. The endometrium however appears distorted and displaced by the large anterior body fibroid. No intrauterine device noted. Apparent linear echogenic area in the endocervical canal is  suboptimally visualized and not evaluated but may represent inferiorly displaced IUD. Right ovary Measurements: 2.1 x 1.2 x 1.3 cm = volume: 1.7 mL. Normal appearance/no adnexal mass. Left ovary Measurements: 4.0 x 2.2 x 4.1 cm = volume: 19 mL. There is a 3.0 x 2.2 x 3.8 cm simple appearing cyst in the left ovary. Recommend followup US  in 3-6 months. Note: This recommendation does not apply to premenarchal patients or to those with increased risk (genetic, family history, elevated tumor markers or other high-risk factors) of ovarian cancer. Reference: Radiology 2019 Nov; 293(2):359-371. Pulsed Doppler evaluation of both ovaries demonstrates normal low-resistance arterial and venous waveforms. Other findings No abnormal free fluid. IMPRESSION: 1. Enlarged heterogeneous uterus with several fibroids. 2. Nonvisualization of the endometrium. Distorted appearance of the endometrium due to mass effect by anterior body fibroid. 3. Linear echogenic area within the cervix may represent displaced IUD. Direct visualization is recommended. 4. Left ovarian cyst measures up to 3.8 cm. Follow-up with ultrasound in 3-6 months recommended. Electronically Signed   By: Vanetta Chou M.D.   On: 07/08/2024 17:05    Scheduled Meds:  atorvastatin   40 mg Oral Daily   ferrous sulfate   325 mg Oral Q breakfast   FLUoxetine   40 mg Oral Daily   megestrol   40 mg Oral BID   metoprolol  succinate  12.5 mg Oral Daily   pregabalin   100 mg Oral BID   sodium chloride  flush  3 mL Intravenous Q12H   Continuous Infusions:   LOS: 0 days   Time spent: 55 minutes  Camellia Door, DO  [facility]  07/09/2024, 8:29 AM

## 2024-07-09 NOTE — Subjective & Objective (Signed)
 Pt seen and examined. Feels better after 2 units PRBC. HgB up to 8.1 g/dl today. Ready for DC. Seen by OB/GYN. Vaginal bleeding has slowed down. Now on megace  40 mg bid.

## 2024-07-09 NOTE — Hospital Course (Addendum)
 CC: anemia, vaginal bleeding HPI: Megan Chaney is a 53 y.o. female with medical history significant of hyperlipidemia, CVA, anxiety, leiomyoma, dysfunctional uterine bleeding, DVT, anemia, myofascial pain syndrome presenting with vaginal bleeding with associated shortness of breath and weakness.   Patient has had known history of leiomyoma.  Is followed with [facility].  She typically has her period monthly for 5 days.  But, her most recent period has been ongoing since August 20.  She reports she had some lighter bleeding for 2 weeks.     She did follow-up with her OB/GYN on 9/10.  For her bleeding their note mentions they have tried Megace  and Provera  in the past.  Currently has IUD.  At most recent visit IUD strings were not visible and with her ongoing bleeding and further enlarged uterus, at 18 weeks at that visit, they were planning for ultrasound to evaluate in October with immediate follow-up.   Patient bleeding has increased since her OB/GYN visit and she is now going through 1-2 pads per hour.  She has since developed associated shortness of breath and weakness and presented to the ED for further evaluation.   Denies fevers, chills, chest pain, constipation, diarrhea, nausea, vomiting   ED Course: Vital signs in the ED notable for heart rate in the 90s-130s, blood pressure in the 100s-120s systolic.   Lab workup notable for CMP with potassium 3.3, bicarb 21, glucose 151, calcium  8.4, protein 5.9, albumin 3.2.  CBC with hemoglobin of 4.9, unclear baseline, patient reports 7.  No recent CBC in our chart since a year ago when her hemoglobin was 11-13.   Patient has received 1 L IV fluids in the ED and 2 units PRBC transfusion has been ordered.   EDP is consulting OB/GYN here for further recommendations and consultation as indicated.  Significant Events: Admitted 07/08/2024 for symptomatic anemia   Admission Labs: WBC 7.2, HgB 4.9, plt 295 Na 137, K 3.3, CO2 of 21, BUN 12, Scr  0.63, glu 151 T prot 5.9, alb 3.2, AST 19, ALT 15, alk phos 54, t. Bili 0.3  Admission Imaging Studies: Pelvic U/S Enlarged heterogeneous uterus with several fibroids. 2. Nonvisualization of the endometrium. Distorted appearance of the endometrium due to mass effect by anterior body fibroid. 3. Linear echogenic area within the cervix may represent displaced IUD. Direct visualization is recommended. 4. Left ovarian cyst measures up to 3.8 cm. Follow-up with ultrasound in 3-6 months recommended.  Significant Labs:   Significant Imaging Studies:   Antibiotic Therapy: Anti-infectives (From admission, onward)    None       Procedures: PRBC transfusion of 2 units  Consultants: OB/SYN

## 2024-07-09 NOTE — Assessment & Plan Note (Addendum)
 07-09-2024 prior hx of cryptogenic CVA. Has patent foramen ovale. Long term megace  therapy not recommended.

## 2024-07-09 NOTE — Assessment & Plan Note (Addendum)
 07-09-2024 stable

## 2024-07-09 NOTE — Discharge Summary (Signed)
 [facility] Physician Discharge Summary   Patient name: Megan Chaney  Admit date:     07/08/2024  Discharge date: 07/09/2024  Attending Physician: MELVIN, ALEXANDER B [[id number]]  Discharge Physician: Camellia Door   PCP: Stephane Leita DEL, MD  Admitted From: Home  Disposition:  Home  Recommendations for Outpatient Follow-up:  Follow up with PCP in 1-2 weeks Follow up with [facility] Dr. Lang as soon as possible. In the next 7 days if possible  Home Health:No Equipment/Devices: None    Discharge Condition:Stable CODE STATUS:FULL Diet recommendation: Heart Healthy Fluid Restriction: None  Hospital Summary: CC: anemia, vaginal bleeding HPI: Megan Chaney is a 53 y.o. female with medical history significant of hyperlipidemia, CVA, anxiety, leiomyoma, dysfunctional uterine bleeding, DVT, anemia, myofascial pain syndrome presenting with vaginal bleeding with associated shortness of breath and weakness.   Patient has had known history of leiomyoma.  Is followed with [facility].  She typically has her period monthly for 5 days.  But, her most recent period has been ongoing since August 20.  She reports she had some lighter bleeding for 2 weeks.     She did follow-up with her OB/GYN on 9/10.  For her bleeding their note mentions they have tried Megace  and Provera  in the past.  Currently has IUD.  At most recent visit IUD strings were not visible and with her ongoing bleeding and further enlarged uterus, at 18 weeks at that visit, they were planning for ultrasound to evaluate in October with immediate follow-up.   Patient bleeding has increased since her OB/GYN visit and she is now going through 1-2 pads per hour.  She has since developed associated shortness of breath and weakness and presented to the ED for further evaluation.   Denies fevers, chills, chest pain, constipation, diarrhea, nausea, vomiting   ED Course: Vital signs in the ED notable for heart rate in the  90s-130s, blood pressure in the 100s-120s systolic.   Lab workup notable for CMP with potassium 3.3, bicarb 21, glucose 151, calcium  8.4, protein 5.9, albumin 3.2.  CBC with hemoglobin of 4.9, unclear baseline, patient reports 7.  No recent CBC in our chart since a year ago when her hemoglobin was 11-13.   Patient has received 1 L IV fluids in the ED and 2 units PRBC transfusion has been ordered.   EDP is consulting OB/GYN here for further recommendations and consultation as indicated.  Significant Events: Admitted 07/08/2024 for symptomatic anemia   Admission Labs: WBC 7.2, HgB 4.9, plt 295 Na 137, K 3.3, CO2 of 21, BUN 12, Scr 0.63, glu 151 T prot 5.9, alb 3.2, AST 19, ALT 15, alk phos 54, t. Bili 0.3  Admission Imaging Studies: Pelvic U/S Enlarged heterogeneous uterus with several fibroids. 2. Nonvisualization of the endometrium. Distorted appearance of the endometrium due to mass effect by anterior body fibroid. 3. Linear echogenic area within the cervix may represent displaced IUD. Direct visualization is recommended. 4. Left ovarian cyst measures up to 3.8 cm. Follow-up with ultrasound in 3-6 months recommended.  Significant Labs:   Significant Imaging Studies:   Antibiotic Therapy: Anti-infectives (From admission, onward)    None       Procedures: PRBC transfusion of 2 units  Consultants: OB/SYN   Hospital Course by Problem: * Symptomatic anemia 07-09-2024 s/p 2 unit PRBC transfusion. Admission HgB of 4.9 g/dl. Post-transfusion 8.1 g/dl.  DUB (dysfunctional uterine bleeding) 07-09-2024 pt seen by OB/GYN consult. Recommended megace  40 mg bid. F/u with [facility] for  definitive treatment of DUB.  Long term megace  not recommended due to pt's prior hx of cryptogenic CVA with patent foramen ovale.  Uterine leiomyoma 07-09-2024 seen on pelvic U/S yesterday. F/u with [facility]. Pt's IUD may be malposition. F/u with [facility] for removal and  reinsertion.  Patent foramen ovale 07-09-2024 seen on Transesophageal echo 10-30-2021. Small PFO with positive bubble study. Pt on daily ASA at home. Will need to hold ASA due to recent menorrhagia causing significant anemia with admission HgB of 4.9 g/dl.  Pt needs emergent f/u with outpatient [facility] Dr. Lang for further treatment of her DUB. ASA therapy should be as soon as possible after her DUB is controlled to prevent further risk of stroke. PCP should consider outpatient referral to cardiology for PFO closure if DUB continues to be a issue and pt cannot take ASA.  Hyperlipidemia 07-09-2024 on lipitor 40 mg daily.  Anxiety 07-09-2024 stable  History of CVA (cerebrovascular accident) 07-09-2024 prior hx of cryptogenic CVA. Has patent foramen ovale. Long term megace  therapy not recommended.    Discharge Diagnoses:  Principal Problem:   Symptomatic anemia Active Problems:   DUB (dysfunctional uterine bleeding)   Uterine leiomyoma   History of CVA (cerebrovascular accident)   Anxiety   Hyperlipidemia   Patent foramen ovale   Discharge Instructions  Discharge Instructions     Call MD for:  difficulty breathing, headache or visual disturbances   Complete by: As directed    Call MD for:  extreme fatigue   Complete by: As directed    Call MD for:  hives   Complete by: As directed    Call MD for:  persistant dizziness or light-headedness   Complete by: As directed    Call MD for:  persistant nausea and vomiting   Complete by: As directed    Call MD for:  redness, tenderness, or signs of infection (pain, swelling, redness, odor or green/yellow discharge around incision site)   Complete by: As directed    Call MD for:  severe uncontrolled pain   Complete by: As directed    Call MD for:  temperature >100.4   Complete by: As directed    Diet - low sodium heart healthy   Complete by: As directed    Discharge instructions   Complete by: As directed    1. Follow up  with your primary care provider in 1-2 weeks following discharge from hospital. 2. Follow up with [facility] as soon as possible. Call office today to move up your appointment to within 1 week.   Increase activity slowly   Complete by: As directed       Allergies as of 07/09/2024       Reactions   Latex Hives, Rash   Other Hives, Rash   Powder in latex gloves        Medication List     PAUSE taking these medications    aspirin  EC 81 MG tablet Wait to take this until your doctor or other care provider tells you to start again. Take 81 mg by mouth daily. Swallow whole.       TAKE these medications    acetaminophen  325 MG tablet Commonly known as: TYLENOL  Take 2 tablets (650 mg total) by mouth every 4 (four) hours as needed for mild pain or headache (or temp > 37.5 C (99.5 F)).   amitriptyline 10 MG tablet Commonly known as: ELAVIL Take 10 mg by mouth at bedtime.   atorvastatin  80  MG tablet Commonly known as: LIPITOR Take 80 mg by mouth daily.   atorvastatin  40 MG tablet Commonly known as: LIPITOR Take 1 tablet (40 mg total) by mouth daily.   baclofen 10 MG tablet Commonly known as: LIORESAL Take 10 mg by mouth every 6 (six) hours as needed for muscle spasms.   ferrous sulfate  325 (65 FE) MG tablet Take 1 tablet (325 mg total) by mouth daily with breakfast.   FLUoxetine  40 MG capsule Commonly known as: PROZAC  Take 40 mg by mouth daily.   levonorgestrel 20 MCG/DAY Iud Commonly known as: MIRENA 1 Application by Intrauterine route once.   megestrol  40 MG tablet Commonly known as: MEGACE  Take 1 tablet (40 mg total) by mouth 2 (two) times daily.   metoprolol  succinate 25 MG 24 hr tablet Commonly known as: TOPROL -XL Take 0.5 tablets (12.5 mg total) by mouth daily.   Oscal 500/200 D-3 500-200 MG-UNIT tablet Generic drug: calcium -vitamin D  Take 1 tablet by mouth 2 (two) times daily.   pregabalin  100 MG capsule Commonly known as: LYRICA  Take 100 mg by  mouth 2 (two) times daily.   senna-docusate 8.6-50 MG tablet Commonly known as: Senokot-S Take 1 tablet by mouth at bedtime. What changed:  when to take this reasons to take this   Vitamin D3 1.25 MG (50000 UT) Caps Take 50,000 Units by mouth once a week.        Follow-up Information     Lang Cesar Dolly, DO. Schedule an appointment as soon as possible for a visit in 1 week(s).   Specialty: Obstetrics and Gynecology Contact information: [address] [place] [address] [phone]                Allergies  Allergen Reactions   Latex Hives and Rash   Other Hives and Rash    Powder in latex gloves    Discharge Exam: Vitals:   07/09/24 0615 07/09/24 0630  BP: (!) 136/91 131/79  Pulse: 80 71  Resp: 16 12  Temp:    SpO2: 100% 100%    Physical Exam Vitals and nursing note reviewed.  Constitutional:      General: She is not in acute distress.    Appearance: She is not toxic-appearing.  HENT:     Head: Normocephalic and atraumatic.  Cardiovascular:     Rate and Rhythm: Normal rate and regular rhythm.  Pulmonary:     Effort: Pulmonary effort is normal.     Breath sounds: Normal breath sounds.  Abdominal:     General: Abdomen is flat. Bowel sounds are normal.     Palpations: Abdomen is soft.  Musculoskeletal:     Right lower leg: No edema.     Left lower leg: No edema.  Skin:    General: Skin is warm and dry.     Capillary Refill: Capillary refill takes less than 2 seconds.  Neurological:     Mental Status: She is alert and oriented to person, place, and time.     The results of significant diagnostics from this hospitalization (including imaging, microbiology, ancillary and laboratory) are listed below for reference.     Labs: Basic Metabolic Panel: Recent Labs  Lab 07/08/24 1158 07/08/24 1221 07/09/24 0340  NA 137 139 139  K 3.3* 3.4* 3.6  CL 106 105 109  CO2 21*  --  22  GLUCOSE 151* 154* 97  BUN 12 12 8    CREATININE 0.63 0.60 0.63  CALCIUM  8.4*  --  8.2*   Liver Function Tests: Recent Labs  Lab 07/08/24 1158 07/09/24 0340  AST 19 17  ALT 15 14  ALKPHOS 54 48  BILITOT 0.3 0.7  PROT 5.9* 5.4*  ALBUMIN 3.2* 2.9*   CBC: Recent Labs  Lab 07/08/24 1158 07/08/24 1221 07/08/24 2321 07/09/24 0340  WBC 7.2  --   --  7.0  HGB 4.9* 5.4* 8.2* 8.1*  HCT 17.0* 16.0* 25.5* 24.9*  MCV 83.7  --   --  84.7  PLT 295  --   --  220   Sepsis Labs Recent Labs  Lab 07/08/24 1158 07/09/24 0340  WBC 7.2 7.0    Procedures/Studies: US  PELVIC COMPLETE W TRANSVAGINAL AND TORSION R/O Result Date: 07/08/2024 CLINICAL DATA:  Vaginal bleeding. EXAM: TRANSABDOMINAL AND TRANSVAGINAL ULTRASOUND OF PELVIS DOPPLER ULTRASOUND OF OVARIES TECHNIQUE: Both transabdominal and transvaginal ultrasound examinations of the pelvis were performed. Transabdominal technique was performed for global imaging of the pelvis including uterus, ovaries, adnexal regions, and pelvic cul-de-sac. It was necessary to proceed with endovaginal exam following the transabdominal exam to visualize the endometrium and ovaries. Color and duplex Doppler ultrasound was utilized to evaluate blood flow to the ovaries. COMPARISON:  Ultrasound dated 08/02/2018. FINDINGS: Uterus Measurements: 12.4 x 7.3 x 8.2 cm = volume: 387 mL. The uterus is enlarged and heterogeneous with multiple fibroids including a 4.6 x 5.3 x 3.9 cm anterior body and a 5.2 x 3.7 x 3.8 cm posterior fundal fibroid. Endometrium The endometrium is not visualized and suboptimally evaluated. The endometrium however appears distorted and displaced by the large anterior body fibroid. No intrauterine device noted. Apparent linear echogenic area in the endocervical canal is suboptimally visualized and not evaluated but may represent inferiorly displaced IUD. Right ovary Measurements: 2.1 x 1.2 x 1.3 cm = volume: 1.7 mL. Normal appearance/no adnexal mass. Left ovary Measurements: 4.0 x 2.2 x  4.1 cm = volume: 19 mL. There is a 3.0 x 2.2 x 3.8 cm simple appearing cyst in the left ovary. Recommend followup US  in 3-6 months. Note: This recommendation does not apply to premenarchal patients or to those with increased risk (genetic, family history, elevated tumor markers or other high-risk factors) of ovarian cancer. Reference: Radiology 2019 Nov; 293(2):359-371. Pulsed Doppler evaluation of both ovaries demonstrates normal low-resistance arterial and venous waveforms. Other findings No abnormal free fluid. IMPRESSION: 1. Enlarged heterogeneous uterus with several fibroids. 2. Nonvisualization of the endometrium. Distorted appearance of the endometrium due to mass effect by anterior body fibroid. 3. Linear echogenic area within the cervix may represent displaced IUD. Direct visualization is recommended. 4. Left ovarian cyst measures up to 3.8 cm. Follow-up with ultrasound in 3-6 months recommended. Electronically Signed   By: Vanetta Chou M.D.   On: 07/08/2024 17:05    Time coordinating discharge: 55 mins  SIGNED:  Camellia Door, DO [facility] 07/09/24, 8:35 AM

## 2024-07-09 NOTE — Assessment & Plan Note (Addendum)
 07-09-2024 seen on pelvic U/S yesterday. F/u with [facility]. Pt's IUD may be malposition. F/u with [facility] for removal and reinsertion.

## 2024-07-09 NOTE — Assessment & Plan Note (Signed)
 07-09-2024 s/p 2 unit PRBC transfusion. Admission HgB of 4.9 g/dl. Post-transfusion 8.1 g/dl.

## 2024-07-09 NOTE — Assessment & Plan Note (Addendum)
 07-09-2024 on lipitor 40 mg daily.

## 2024-07-10 ENCOUNTER — Ambulatory Visit (HOSPITAL_BASED_OUTPATIENT_CLINIC_OR_DEPARTMENT_OTHER): Admitting: Physical Therapy

## 2024-07-10 LAB — CUP PACEART REMOTE DEVICE CHECK
Date Time Interrogation Session: 20250914232802
Implantable Pulse Generator Implant Date: 20230106

## 2024-07-12 ENCOUNTER — Ambulatory Visit: Payer: Self-pay | Admitting: Cardiology

## 2024-07-12 ENCOUNTER — Ambulatory Visit (HOSPITAL_BASED_OUTPATIENT_CLINIC_OR_DEPARTMENT_OTHER): Admitting: Physical Therapy

## 2024-07-16 NOTE — Progress Notes (Signed)
 Remote Loop Recorder Transmission

## 2024-07-17 ENCOUNTER — Ambulatory Visit (HOSPITAL_BASED_OUTPATIENT_CLINIC_OR_DEPARTMENT_OTHER): Admitting: Physical Therapy

## 2024-07-17 ENCOUNTER — Encounter (HOSPITAL_BASED_OUTPATIENT_CLINIC_OR_DEPARTMENT_OTHER): Payer: Self-pay | Admitting: Physical Therapy

## 2024-07-17 DIAGNOSIS — M533 Sacrococcygeal disorders, not elsewhere classified: Secondary | ICD-10-CM | POA: Diagnosis not present

## 2024-07-17 DIAGNOSIS — M25551 Pain in right hip: Secondary | ICD-10-CM

## 2024-07-17 DIAGNOSIS — M6281 Muscle weakness (generalized): Secondary | ICD-10-CM

## 2024-07-17 DIAGNOSIS — R2681 Unsteadiness on feet: Secondary | ICD-10-CM

## 2024-07-17 NOTE — Therapy (Addendum)
 OUTPATIENT PHYSICAL THERAPY THORACOLUMBAR TREATMENT   Patient Name: Megan Chaney MRN: [id number] DOB:1971/03/23, 53 y.o., female Today's Date: 07/17/2024  END OF SESSION:  PT End of Session - 07/17/24 1405     Visit Number 5    Date for Recertification  07/23/24    Authorization Type [employer] Approved 7 visits-05/28/2024-07/26/2024-auth# [id number]    Authorization Time Period -05/28/2024-07/26/2024    Authorization - Visit Number 5    Authorization - Number of Visits 7    Progress Note Due on Visit 10    PT Start Time 1400    PT Stop Time 1440    PT Time Calculation (min) 40 min    Activity Tolerance Patient tolerated treatment well    Behavior During Therapy [facility] for tasks assessed/performed            Past Medical History:  Diagnosis Date   Acute deep vein thrombosis (DVT) of right upper extremity (HCC) 11/03/2021   Anemia    Fibroid    GERD (gastroesophageal reflux disease)    Past Surgical History:  Procedure Laterality Date   BUBBLE STUDY  10/30/2021   Procedure: BUBBLE STUDY;  Surgeon: Delford Maude BROCKS, MD;  Location: [facility] ENDOSCOPY;  Service: Cardiovascular;;   LOOP RECORDER INSERTION N/A 10/30/2021   Procedure: LOOP RECORDER INSERTION;  Surgeon: Cindie Ole DASEN, MD;  Location: [facility] INVASIVE CV LAB;  Service: Cardiovascular;  Laterality: N/A;   MULTIPLE TOOTH EXTRACTIONS     NO PAST SURGERIES     TEE WITHOUT CARDIOVERSION N/A 10/30/2021   Procedure: TRANSESOPHAGEAL ECHOCARDIOGRAM (TEE);  Surgeon: Delford Maude BROCKS, MD;  Location: [facility] ENDOSCOPY;  Service: Cardiovascular;  Laterality: N/A;   Patient Active Problem List   Diagnosis Date Noted   Anxiety 07/08/2024   Hemiplegia of dominant side as late effect of cerebrovascular disease (HCC) 07/08/2024   AVM (arteriovenous malformation) 07/18/2023   Myofascial pain dysfunction syndrome 02/15/2023   Patent foramen ovale 12/17/2022   IUD (intrauterine device) in place 05/06/2022   Hyperlipidemia 11/03/2021    Right sided weakness 11/03/2021   Symptomatic anemia 11/03/2021   History of CVA (cerebrovascular accident) 10/26/2021   Infertility counseling 10/04/2019   Enlarged uterus 07/31/2018   Uterine leiomyoma 07/31/2018   DUB (dysfunctional uterine bleeding) 07/31/2018    PCP: Leita Blind, MD  REFERRING PROVIDER: Catherene Toribio Pac, MD  REFERRING DIAG: M53.3 (ICD-10-CM) - Sacrococcygeal disorders, not elsewhere classified  Rationale for Evaluation and Treatment: Rehabilitation  THERAPY DIAG:  Sacrococcygeal disorders, not elsewhere classified  Pain in right hip  Muscle weakness (generalized)  Unsteadiness on feet  ONSET DATE: chronic  SUBJECTIVE:  SUBJECTIVE STATEMENT: Patient reports that since last visit, she was hospitalized for 1 day for blood transfusion since iron  was so low.  Pt reports she is feeling much better now.  Pt reports she has not had the chance to look into pools yet.   POOL ACCESS: currently none.   From initial evaluation:  Pt referred to OPPT for aquatic therapy with option to transition to land-based therapy as tolerated.  Pt fell two times on the same day in 2023 and was discovered to have sustained a stroke that day. History of multiple strokes and has Rt > Lt chronic global back pain (head to low back) which she attributes to the falls, sacroiliac pain bil and Rt hip pain.  Has done extensive aquatic PT in past which really helped.  Injections in 2024 helped for about 6 mos.  Will be getting repeat SI injections 06/04/24.  Is on disability. Pt aims to walk 15 min 2-4x/week.  PERTINENT HISTORY:  significant history of multiple strokes - has undergone extensive PT for stroke rehabilitation and low back pain management including aquatic PT, which has provided some  relief  10/27/21 Multifocal acute/subacute nonhemorrhagic infarcts involving the left MCA territory.  Previous injections:  September 2024 B/L SI joint injection - provided relief for 3-6 mos, will be scheduling repeat injections  June 2024 Rt greater trochanteric bursa  currently taking Lyrica  and Baclofen for neuropathic pain History of cardiac cath  Has loop recorder  PAIN:  PAIN:  Are you having pain? Yes NPRS scale: 4/10  Pain location: bil LBP and SI joint pain down into LLE PAIN TYPE: aching, dull, sharp, shooting back pains Pain description: intermittent  Aggravating factors: laying down, bending Relieving factors: medications, sleep   PRECAUTIONS: None  RED FLAGS: None  WEIGHT BEARING RESTRICTIONS: No  FALLS:  Has patient fallen in last 6 months? No but has a fear of falling  LIVING ENVIRONMENT: Lives with: lives with roommates - is looking for housing Lives in: House/apartment Stairs: No Has following equipment at home: Single point cane but isn't using it lately - does better without it  OCCUPATION: disabled  PLOF: Independent with basic ADLs, Independent with household mobility without device, Independent with community mobility without device, and Independent with transfers  PATIENT GOALS: learn an aquatic and land based HEP to help manage pain and become less dependent on medications/injections  NEXT MD VISIT: 06/04/24 for bil SI injections  OBJECTIVE:  Note: Objective measures were completed at Evaluation unless otherwise noted.  DIAGNOSTIC FINDINGS:  MRI LUMBAR SPINE WITHOUT CONTRAST  L4-5: Moderate central narrowing of the thecal sac along with mild left and borderline right subarticular lateral recess stenosis due to central disc protrusion, disc bulge, and short pedicles.  PATIENT SURVEYS:  LEFS  Extreme difficulty/unable (0), Quite a bit of difficulty (1), Moderate difficulty (2), Little difficulty (3), No difficulty (4) Survey date:   05/28/24  Any of your usual work, housework or school activities 3  2. Usual hobbies, recreational or sporting activities 1  3. Getting into/out of the bath 2  4. Walking between rooms 2  5. Putting on socks/shoes 1  6. Squatting  1  7. Lifting an object, like a bag of groceries from the floor 1  8. Performing light activities around your home 2  9. Performing heavy activities around your home 1  10. Getting into/out of a car 1  11. Walking 2 blocks 1  12. Walking 1 mile 0  13. Going up/down 10 stairs (  1 flight) 0  14. Standing for 1 hour 0  15.  sitting for 1 hour 2  16. Running on even ground 0  17. Running on uneven ground 0  18. Making sharp turns while running fast 0  19. Hopping  0  20. Rolling over in bed 1  Score total:  19/80     COGNITION: Overall cognitive status: Within functional limits for tasks assessed     SENSATION: Intermittent shooting pain with numbness into toes  MUSCLE LENGTH: Hamstrings: Right 60 with mild spasticity deg; Left 70 deg Thomas test: Right very tight can't get to 0 deg - flexed to 15 deg Right gluteals, piriformis limited 50% compared to [employer] and hip flexor on Rt signif limited  POSTURE: Rt pelvic inflare with Rt LE IR with in-toeing, some spasticity contributing  PALPATION: Signif TTP Rt>Lt SI joint Signif tension/hypertonicity in Rt adductors, hamstrings, proximal quads/hip flexors, hip internal rotators Significant tension and spasm present in trunk paraspinals thoracic to lumbar bil Hypomobile Rt>Lt SI joints with pain  LUMBAR ROM:   AROM eval  Flexion Braces hands on thighs and walks hands down to mid-thigh without lumbar reversal, signif pain  Extension NT  Right lateral flexion 75% limitated  Left lateral flexion 75% limitated  Right rotation 75% limitated  Left rotation 75% limitated   (Blank rows = not tested)  LOWER EXTREMITY ROM:    Rt hip ER 35 deg Rt hip ext to neutral - very tight hip flexors and  quads   LOWER EXTREMITY MMT:    MMT Right eval Left eval  Hip flexion 3+ 4+  Hip extension 3+ 4  Hip abduction 3+ 4  Hip adduction 3+ 4  Hip internal rotation 4- 4  Hip external rotation 3+ 4  Knee flexion 3+ 4  Knee extension 4- 4  Ankle dorsiflexion 4 4  Ankle plantarflexion 4 4  Ankle inversion    Ankle eversion     (Blank rows = not tested)  LUMBAR SPECIAL TESTS:  SI Compression/distraction test: Positive and FABER test: Positive  FUNCTIONAL TESTS:  5 times sit to stand: 1:01.07 signif use of hands on chair, avoids hip hinge due to pain Timed up and go (TUG): 27.29 no AD : 32.28 sec no AD  GAIT: Distance walked: 30 feet Assistive device utilized: None Level of assistance: Modified independence Comments: Rt LE IR with in-toeing, short stance time on Rt, small stride length bil, lacks trunk rotation  TREATMENT DATE:  [facility] Adult PT Treatment:                                             Date: 07/17/24 Pt seen for aquatic therapy today.  Treatment took place in water 3.5-4.75 ft in depth at the [facility] pool. Temp of water was 91.  Pt entered/exited the pool via stairs independently with bilat rail.  - UE on barbell walking forward/ backward with cues for increased R step height and length- 3 laps;  - UE on barbell side stepping  -> with arm horz abdct/ add on surface with yellow hand floats- 2 laps, -> shoulder add/abdct with rainbow hand floats-> short hollow noodles (improved tolerance) - UE on wall: leg swings into hip flexion/ extension x 10 each LE; alternating single leg clams x 8 each; hip circles CW/ CCW x 5 each LE, hip abdct/add  -  marching backward/forward with row motion with rainbow hand floats -> suitcase carry with bil / single rainbow hand float under water at side - UE on rainbow hand floats:  tandem gait forward/ backward;  hip abdct/ add 2 x5 - straddling noodle with UE support: cycling, hip abdct/add and hip flex/extension,  cycling  [facility] Adult PT Treatment:                                             Date: 06/26/24 Pt seen for aquatic therapy today.  Treatment took place in water 3.5-4.75 ft in depth at the [facility] pool. Temp of water was 91.  Pt entered/exited the pool via stairs independently with bilat rail.  - UE on barbell walking forward/ backward - 3 laps;  - UE on barbell side stepping with cues for increased R step height -> with arm horz abdct/ add with yellow hand floats  - UE on wall: leg swings into hip flexion/ extension x 8 each LE; alternating single leg clams x 5 each  - marching backward/forward with row motion with yellow hand floats  - squatted rest with UE on wall, bil clams - straddling noodle with UE support: cycling   06/13/2024 NuStep 3 6 mins- PT present to discuss status Seated sciatic nerve glide x 10 Seated cat cow x 10 LTR with legs supported on red stability ball x 8 each direction Hooklying single knee to chest 2 x 20 sec bilateral  Seated purple ball press x 10 Seated alt hand and knee press with purple ball x 10 bilateral  Seated unilateral clam with yellow loop x 10 bilateral  Seated march with yellow loop x 20  Sit to stand with yellow loop x 10  [facility] Adult PT Treatment:                                             Date: 05/29/24 Pt seen for aquatic therapy today.  Treatment took place in water 3.5-4.75 ft in depth at the [facility] pool. Temp of water was 91.  Pt entered/exited the pool via stairs independently with bilat rail.  - reacquainting to aquatic therapy principles - UE on barbell walking backward - 3 laps; side stepping  - squatted rest  - side stepping with UE horz abdct/add -> add/abdct with short hollow noodles - forward / backward marching with row motion - UE on wall:   hip add/abdct x 10  -return to walking with arm swing - forward/ backward and side stepping  - straddling noodle with UE support: cycling   Pt requires the  buoyancy and hydrostatic pressure of water for support, and to offload joints by unweighting joint load by at least 50 % in navel deep water and by at least 75-80% in chest to neck deep water.  Viscosity of the water is needed for resistance of strengthening. Water current perturbations provides challenge to standing balance requiring increased core activation.  PATIENT EDUCATION:  Education details: [email or web address] Person educated: Patient Education method: Explanation Education comprehension: verbalized understanding  HOME EXERCISE PROGRAM: Access Code: [email or web address] URL: [email or web address] Date: 05/28/2024 Prepared by: Orvil Beuhring  Exercises - Hooklying Single Knee to Chest Stretch  - 1 x daily - 7 x weekly - 3 sets - 10 reps - Supine Posterior Pelvic Tilt  - 1 x daily - 7 x weekly - 3 sets - 10 reps - Supine Figure 4 Piriformis Stretch  - 1 x daily - 7 x weekly - 3 sets - 10 reps - Supine Piriformis Stretch with Foot on Ground  - 1 x daily - 7 x weekly - 3 sets - 10 reps - Sidelying Transversus Abdominis Bracing  - 1 x daily - 7 x weekly - 1 sets - 10 reps - 5 hold  ASSESSMENT:  CLINICAL IMPRESSION: Terrilyn tolerated all aquatic therapy exercises well, with pain slightly reduced by end of session. She requires some cues for more mindful movement through RUE and RLE.  Encouraged pt to ck out pools in area for potential to transition to local pool for independent aquatic exercise at d/c. Therapist to check short and long term goals for potential recert at next land visit.   From initial evaluation:  Patient is a 53 y.o. female with complex medical history who was seen today for physical therapy evaluation and treatment for chronic global back pain and Rt LE pain and weakness.  She had two falls on the same day in 2023 and was discovered to have had  a stroke.  She is on disability and is in temporary housing arrangements.  Assessment today reveals significant restriction in Rt hemipelvis, hip and knee related to some hypertonicity/spasticity in Rt LE.  Pt has very restricted Rt>Lt SI joint mobility.  She performs transfers and ambulates slowly secondary to significant pain which she rates as 9-10/10.  Her times tests for 5xSTS, TUG, and support that she is at high risk for falls.  Her LEFS score is 19/80 which is signif disability.  She is scheduled to get repeat injections into bil SI joints on 06/04/24 as these gave her several months of relief last year.  Pt has had aquatic PT in the past which was very helpful. We discussed a hybrid plan of land-based and aquatic-based PT with more efforts to get into aquatic PT schedule on the front end of PT given her pain levels and functional mobility restrictions.  Pt will benefit from skilled PT to maximize her functional strength, mobility, gait and safety, along with educate her on aquatic and land based HEP to support gains made in PT.  OBJECTIVE IMPAIRMENTS: Abnormal gait, decreased activity tolerance, decreased coordination, decreased endurance, decreased mobility, difficulty walking, decreased ROM, decreased strength, hypomobility, increased muscle spasms, impaired flexibility, impaired sensation, impaired tone, improper body mechanics, postural dysfunction, and pain.   ACTIVITY LIMITATIONS: carrying, lifting, bending, sitting, standing, squatting, sleeping, transfers, bed mobility, bathing, dressing, hygiene/grooming, and locomotion level  PARTICIPATION LIMITATIONS: cleaning, laundry, driving, shopping, and community activity  PERSONAL FACTORS: 1-2 comorbidities: history of multiple strokes, cardiac history, on disability, time since onset are also affecting patient's functional outcome.   REHAB POTENTIAL: Excellent  CLINICAL DECISION MAKING: Evolving/moderate complexity  EVALUATION  COMPLEXITY: Moderate   GOALS: Goals reviewed with patient? Yes  SHORT TERM GOALS: Target date: 06/25/24  Pt will be ind with initial HEP for both land and aquatic environments without exacerbation of pain Baseline: Goal status: MET 07/26/2024  2.  Pt  will improve 5x STS to 45 sec or less using proper body mechanics and mod use of UE Baseline:  Goal status: IN PROGRESS 07/26/2024  3.  Pt will improve lumbar ROM to allow greater ease for bending to don shoes/socks Baseline:  Goal status: IN PROGRESS 07/26/2024  4.  Pt will report improved pain to 7-8/10 with ADLs Baseline: 9-10/10 Goal status: IN PROGRESS 07/26/2024  5.  Pt will be able to perform aquatic ambulation for 10 min with brief rests as needed to work on cardiovascular endurance and functional ROM/strength of bil LE Baseline:  Goal status: INITIAL    LONG TERM GOALS: Target date: 07/23/24  Pt will be ind with advanced/final HEP for aquatic and land based HEP to maintain gains made in PT Baseline:  Goal status: IN PROGRESS 07/26/2024  2.  LEFS score to improve by at least 8 points to demo reduced limitations Baseline: 19/80 Goal status: IN PROGRESS 07/26/2024  3.  TUG test to improve to 22 sec or less to demo improved functional transfer and gait safety and mobility. Baseline: 27.29 Goal status: IN PROGRESS 07/26/2024  4.  Pt will improve Rt quad and hip flexor mobility to allow for full stride length of Lt LE for more efficient smooth gait Baseline:  Goal status: IN PROGRESS 07/26/2024  5.  Pt will perform in 25 sec or less to demo improved gait speed for community access. Baseline:  Goal status: IN PROGRESS 07/26/2024  6.  Pt will achieve at least 4/5 Rt LE strength to allow for improved stairs to access housing options with stairs. Baseline:  Goal status: IN PROGRESS 07/26/2024  PLAN:  PT FREQUENCY: 2x/week  PT DURATION: 8 weeks  PLANNED INTERVENTIONS: 97110-Therapeutic exercises, 97530- Therapeutic  activity, 97112- Neuromuscular re-education, 97535- Self Care, [id number]- Manual therapy, [phone]- Gait training, [phone]- Aquatic Therapy, [phone]- Electrical stimulation (unattended), [phone]- Traction (mechanical), [phone] (1-2 muscles), [phone] (3+ muscles)- Dry Needling, Patient/Family education, Balance training, Stair training, Taping, Joint mobilization, Spinal mobilization, DME instructions, Cryotherapy, and Moist heat.  PLAN FOR NEXT SESSION: assess tolerance to treatment session; walking for endurance and LE mobility, Rt LE mobility into hip ext, Rt knee flexion, hamstring stretching, Rt hip ER (Rt hemipelvis is IR with in-toeing - has some spasticity), land: review HEP, try gentle stretching of Rt hip toward ER, ext, knee flexion for anterior thigh and hip stretching, gentle spine ROM, manual therapy, sit to stand  Delon Aquas, PTA 07/17/24 2:42 PM [facility] [address] [place], [place], [address] Phone: [phone]   Fax:  [phone]

## 2024-07-19 ENCOUNTER — Ambulatory Visit (HOSPITAL_BASED_OUTPATIENT_CLINIC_OR_DEPARTMENT_OTHER): Admitting: Physical Therapy

## 2024-07-19 NOTE — Progress Notes (Signed)
 Remote Loop Recorder Transmission

## 2024-07-23 ENCOUNTER — Ambulatory Visit

## 2024-07-23 ENCOUNTER — Encounter

## 2024-07-26 ENCOUNTER — Ambulatory Visit (HOSPITAL_BASED_OUTPATIENT_CLINIC_OR_DEPARTMENT_OTHER): Admitting: Physical Therapy

## 2024-07-26 ENCOUNTER — Ambulatory Visit: Admitting: Physical Therapy

## 2024-08-01 NOTE — Progress Notes (Signed)
 Remote Loop Recorder Transmission

## 2024-08-02 ENCOUNTER — Ambulatory Visit: Attending: Pain Medicine | Admitting: Physical Therapy

## 2024-08-02 ENCOUNTER — Encounter: Payer: Self-pay | Admitting: Physical Therapy

## 2024-08-02 DIAGNOSIS — M25551 Pain in right hip: Secondary | ICD-10-CM | POA: Diagnosis present

## 2024-08-02 DIAGNOSIS — M533 Sacrococcygeal disorders, not elsewhere classified: Secondary | ICD-10-CM | POA: Diagnosis present

## 2024-08-02 DIAGNOSIS — R2681 Unsteadiness on feet: Secondary | ICD-10-CM | POA: Insufficient documentation

## 2024-08-02 DIAGNOSIS — M6281 Muscle weakness (generalized): Secondary | ICD-10-CM | POA: Diagnosis present

## 2024-08-02 NOTE — Therapy (Signed)
 OUTPATIENT PHYSICAL THERAPY THORACOLUMBAR TREATMENT/ RE-CERTIFICATION    Patient Name: Megan Chaney MRN: [id number] DOB:1971/01/18, 53 y.o., female Today's Date: 08/02/2024  END OF SESSION:  PT End of Session - 08/02/24 1628     Visit Number 6    Date for Recertification  09/13/24    Authorization Type [employer] approved 4 visits 07/27/24-08/25/24    Authorization Time Period 07/27/24-08/25/24    Authorization - Visit Number 1    Authorization - Number of Visits 4    Progress Note Due on Visit 10    PT Start Time 1449    PT Stop Time 1529    PT Time Calculation (min) 40 min    Activity Tolerance Patient tolerated treatment well    Behavior During Therapy [facility] for tasks assessed/performed             Past Medical History:  Diagnosis Date   Acute deep vein thrombosis (DVT) of right upper extremity (HCC) 11/03/2021   Anemia    Fibroid    GERD (gastroesophageal reflux disease)    Past Surgical History:  Procedure Laterality Date   BUBBLE STUDY  10/30/2021   Procedure: BUBBLE STUDY;  Surgeon: Delford Maude BROCKS, MD;  Location: [facility] ENDOSCOPY;  Service: Cardiovascular;;   LOOP RECORDER INSERTION N/A 10/30/2021   Procedure: LOOP RECORDER INSERTION;  Surgeon: Cindie Ole DASEN, MD;  Location: [facility] INVASIVE CV LAB;  Service: Cardiovascular;  Laterality: N/A;   MULTIPLE TOOTH EXTRACTIONS     NO PAST SURGERIES     TEE WITHOUT CARDIOVERSION N/A 10/30/2021   Procedure: TRANSESOPHAGEAL ECHOCARDIOGRAM (TEE);  Surgeon: Delford Maude BROCKS, MD;  Location: [facility] ENDOSCOPY;  Service: Cardiovascular;  Laterality: N/A;   Patient Active Problem List   Diagnosis Date Noted   Anxiety 07/08/2024   Hemiplegia of dominant side as late effect of cerebrovascular disease (HCC) 07/08/2024   AVM (arteriovenous malformation) 07/18/2023   Myofascial pain dysfunction syndrome 02/15/2023   Patent foramen ovale 12/17/2022   IUD (intrauterine device) in place 05/06/2022   Hyperlipidemia 11/03/2021   Right sided weakness  11/03/2021   Symptomatic anemia 11/03/2021   History of CVA (cerebrovascular accident) 10/26/2021   Infertility counseling 10/04/2019   Enlarged uterus 07/31/2018   Uterine leiomyoma 07/31/2018   DUB (dysfunctional uterine bleeding) 07/31/2018    PCP: Leita Blind, MD  REFERRING PROVIDER: Catherene Toribio Pac, MD  REFERRING DIAG: M53.3 (ICD-10-CM) - Sacrococcygeal disorders, not elsewhere classified  Rationale for Evaluation and Treatment: Rehabilitation  THERAPY DIAG:  Sacrococcygeal disorders, not elsewhere classified - Plan: PT plan of care cert/re-cert  Pain in right hip - Plan: PT plan of care cert/re-cert  Muscle weakness (generalized) - Plan: PT plan of care cert/re-cert  Unsteadiness on feet - Plan: PT plan of care cert/re-cert  ONSET DATE: chronic  SUBJECTIVE:  SUBJECTIVE STATEMENT: Patient reports she has been in the hospital due to low iron  levels. She is doing better today.  POOL ACCESS: currently none.   From initial evaluation:  Pt referred to OPPT for aquatic therapy with option to transition to land-based therapy as tolerated.  Pt fell two times on the same day in 2023 and was discovered to have sustained a stroke that day. History of multiple strokes and has Rt > Lt chronic global back pain (head to low back) which she attributes to the falls, sacroiliac pain bil and Rt hip pain.  Has done extensive aquatic PT in past which really helped.  Injections in 2024 helped for about 6 mos.  Will be getting repeat SI injections 06/04/24.  Is on disability. Pt aims to walk 15 min 2-4x/week.  PERTINENT HISTORY:  significant history of multiple strokes - has undergone extensive PT for stroke rehabilitation and low back pain management including aquatic PT, which has provided some  relief  10/27/21 Multifocal acute/subacute nonhemorrhagic infarcts involving the left MCA territory.  Previous injections:  September 2024 B/L SI joint injection - provided relief for 3-6 mos, will be scheduling repeat injections  June 2024 Rt greater trochanteric bursa  currently taking Lyrica  and Baclofen for neuropathic pain History of cardiac cath  Has loop recorder  PAIN:  PAIN:  Are you having pain? Yes NPRS scale: 4/10  Pain location: bil LBP and SI joint pain down into LLE PAIN TYPE: aching, dull, sharp, shooting back pains Pain description: intermittent  Aggravating factors: laying down, bending Relieving factors: medications, sleep   PRECAUTIONS: None  RED FLAGS: None  WEIGHT BEARING RESTRICTIONS: No  FALLS:  Has patient fallen in last 6 months? No but has a fear of falling  LIVING ENVIRONMENT: Lives with: lives with roommates - is looking for housing Lives in: House/apartment Stairs: No Has following equipment at home: Single point cane but isn't using it lately - does better without it  OCCUPATION: disabled  PLOF: Independent with basic ADLs, Independent with household mobility without device, Independent with community mobility without device, and Independent with transfers  PATIENT GOALS: learn an aquatic and land based HEP to help manage pain and become less dependent on medications/injections  NEXT MD VISIT: 06/04/24 for bil SI injections  OBJECTIVE:  Note: Objective measures were completed at Evaluation unless otherwise noted.  DIAGNOSTIC FINDINGS:  MRI LUMBAR SPINE WITHOUT CONTRAST  L4-5: Moderate central narrowing of the thecal sac along with mild left and borderline right subarticular lateral recess stenosis due to central disc protrusion, disc bulge, and short pedicles.  PATIENT SURVEYS:  LEFS  Extreme difficulty/unable (0), Quite a bit of difficulty (1), Moderate difficulty (2), Little difficulty (3), No difficulty (4) Survey date:   05/28/24  Any of your usual work, housework or school activities 3  2. Usual hobbies, recreational or sporting activities 1  3. Getting into/out of the bath 2  4. Walking between rooms 2  5. Putting on socks/shoes 1  6. Squatting  1  7. Lifting an object, like a bag of groceries from the floor 1  8. Performing light activities around your home 2  9. Performing heavy activities around your home 1  10. Getting into/out of a car 1  11. Walking 2 blocks 1  12. Walking 1 mile 0  13. Going up/down 10 stairs (1 flight) 0  14. Standing for 1 hour 0  15.  sitting for 1 hour 2  16. Running on even ground 0  17. Running on uneven ground 0  18. Making sharp turns while running fast 0  19. Hopping  0  20. Rolling over in bed 1  Score total:  19/80     08/02/2024 LEFS 21/80 26.3%  COGNITION: Overall cognitive status: Within functional limits for tasks assessed     SENSATION: Intermittent shooting pain with numbness into toes  MUSCLE LENGTH: Hamstrings: Right 60 with mild spasticity deg; Left 70 deg Thomas test: Right very tight can't get to 0 deg - flexed to 15 deg Right gluteals, piriformis limited 50% compared to [employer] and hip flexor on Rt signif limited  POSTURE: Rt pelvic inflare with Rt LE IR with in-toeing, some spasticity contributing  PALPATION: Signif TTP Rt>Lt SI joint Signif tension/hypertonicity in Rt adductors, hamstrings, proximal quads/hip flexors, hip internal rotators Significant tension and spasm present in trunk paraspinals thoracic to lumbar bil Hypomobile Rt>Lt SI joints with pain  LUMBAR ROM:   AROM eval  Flexion Braces hands on thighs and walks hands down to mid-thigh without lumbar reversal, signif pain  Extension NT  Right lateral flexion 75% limitated  Left lateral flexion 75% limitated  Right rotation 75% limitated  Left rotation 75% limitated   (Blank rows = not tested)  LOWER EXTREMITY ROM:    Rt hip ER 35 deg Rt hip ext to neutral - very  tight hip flexors and quads   LOWER EXTREMITY MMT:    MMT Right eval Left eval  Hip flexion 3+ 4+  Hip extension 3+ 4  Hip abduction 3+ 4  Hip adduction 3+ 4  Hip internal rotation 4- 4  Hip external rotation 3+ 4  Knee flexion 3+ 4  Knee extension 4- 4  Ankle dorsiflexion 4 4  Ankle plantarflexion 4 4  Ankle inversion    Ankle eversion     (Blank rows = not tested)  LUMBAR SPECIAL TESTS:  SI Compression/distraction test: Positive and FABER test: Positive  FUNCTIONAL TESTS:  5 times sit to stand: 1:01.07 signif use of hands on chair, avoids hip hinge due to pain Timed up and go (TUG): 27.29 no AD : 32.28 sec no AD  08/02/2024 5STS:28.45 sec (unilateral hand support) TUG:14.74 sec no AD walk test: 15.69 sec  GAIT: Distance walked: 30 feet Assistive device utilized: None Level of assistance: Modified independence Comments: Rt LE IR with in-toeing, short stance time on Rt, small stride length bil, lacks trunk rotation  TREATMENT DATE:  [facility] Adult PT Treatment:                                              08/02/2024 NuStep Level 3 5 mins- PT present to discuss status LEFS 21/80 26.3% Goal Assessment & Plan for the next 6 weeks 5STS:28.45 sec (unilateral hand support) TUG:14.74 sec no AD walk test: 15.69 sec 3 way stability ball stretch x 8 each direction Seated figure four 2 x 30 sec bilateral  Doorway WL stretch x 20 sec bilateral  Seated sciatic nerve glide x 10    Date: 07/17/24 Pt seen for aquatic therapy today.  Treatment took place in water 3.5-4.75 ft in depth at the [facility] pool. Temp of water was 91.  Pt entered/exited the pool via stairs independently with bilat rail.  - UE on barbell walking forward/ backward with cues for increased R step height and length- 3  laps;  - UE on barbell side stepping  -> with arm horz abdct/ add on surface with yellow hand floats- 2 laps, -> shoulder add/abdct with rainbow hand floats->  short hollow noodles (improved tolerance) - UE on wall: leg swings into hip flexion/ extension x 10 each LE; alternating single leg clams x 8 each; hip circles CW/ CCW x 5 each LE, hip abdct/add  - marching backward/forward with row motion with rainbow hand floats -> suitcase carry with bil / single rainbow hand float under water at side - UE on rainbow hand floats:  tandem gait forward/ backward;  hip abdct/ add 2 x5 - straddling noodle with UE support: cycling, hip abdct/add and hip flex/extension, cycling  [facility] Adult PT Treatment:                                             Date: 06/26/24 Pt seen for aquatic therapy today.  Treatment took place in water 3.5-4.75 ft in depth at the [facility] pool. Temp of water was 91.  Pt entered/exited the pool via stairs independently with bilat rail.  - UE on barbell walking forward/ backward - 3 laps;  - UE on barbell side stepping with cues for increased R step height -> with arm horz abdct/ add with yellow hand floats  - UE on wall: leg swings into hip flexion/ extension x 8 each LE; alternating single leg clams x 5 each  - marching backward/forward with row motion with yellow hand floats  - squatted rest with UE on wall, bil clams - straddling noodle with UE support: cycling   06/13/2024 NuStep 3 6 mins- PT present to discuss status Seated sciatic nerve glide x 10 Seated cat cow x 10 LTR with legs supported on red stability ball x 8 each direction Hooklying single knee to chest 2 x 20 sec bilateral  Seated purple ball press x 10 Seated alt hand and knee press with purple ball x 10 bilateral  Seated unilateral clam with yellow loop x 10 bilateral  Seated march with yellow loop x 20  Sit to stand with yellow loop x 10    PATIENT EDUCATION:  Education details: [email or web address] Person educated: Patient Education method: Explanation Education comprehension: verbalized understanding  HOME EXERCISE PROGRAM: Access Code: [email or web address] URL:  [email or web address] Date: 05/28/2024 Prepared by: Orvil Beuhring  Exercises - Hooklying Single Knee to Chest Stretch  - 1 x daily - 7 x weekly - 3 sets - 10 reps - Supine Posterior Pelvic Tilt  - 1 x daily - 7 x weekly - 3 sets - 10 reps - Supine Figure 4 Piriformis Stretch  - 1 x daily - 7 x weekly - 3 sets - 10 reps - Supine Piriformis Stretch with Foot on Ground  - 1 x daily - 7 x weekly - 3 sets - 10 reps - Sidelying Transversus Abdominis Bracing  - 1 x daily - 7 x weekly - 1 sets - 10 reps - 5 hold  ASSESSMENT:  CLINICAL IMPRESSION: Re-certification completed today. Jette has made great progress with skilled therapy. All her functional times has improved significantly since evaluation and she has met those goals. She feels she is getting stronger and her balance is getting better with aquatics and land therapy. Putting on her socks and shoes are still challenging for her sometimes. Recently, she has has trouble with her iron   levels due to her anemia and she was hospitalized. She has an appointment with a neurologist soon to discuss her nerve conduction study results. She has been walking 2x a week for improved cardiovascular endurance. Patient would benefit from continued therapy to meet remaining goals, improve strength, and maintain independence.      From initial evaluation:  Patient is a 53 y.o. female with complex medical history who was seen today for physical therapy evaluation and treatment for chronic global back pain and Rt LE pain and weakness.  She had two falls on the same day in 2023 and was discovered to have had a stroke.  She is on disability and is in temporary housing arrangements.  Assessment today reveals significant restriction in Rt hemipelvis, hip and knee related to some hypertonicity/spasticity in Rt LE.  Pt has very restricted Rt>Lt SI joint mobility.  She performs transfers and ambulates slowly secondary to significant pain which she rates as  9-10/10.  Her times tests for 5xSTS, TUG, and support that she is at high risk for falls.  Her LEFS score is 19/80 which is signif disability.  She is scheduled to get repeat injections into bil SI joints on 06/04/24 as these gave her several months of relief last year.  Pt has had aquatic PT in the past which was very helpful. We discussed a hybrid plan of land-based and aquatic-based PT with more efforts to get into aquatic PT schedule on the front end of PT given her pain levels and functional mobility restrictions.  Pt will benefit from skilled PT to maximize her functional strength, mobility, gait and safety, along with educate her on aquatic and land based HEP to support gains made in PT.  OBJECTIVE IMPAIRMENTS: Abnormal gait, decreased activity tolerance, decreased coordination, decreased endurance, decreased mobility, difficulty walking, decreased ROM, decreased strength, hypomobility, increased muscle spasms, impaired flexibility, impaired sensation, impaired tone, improper body mechanics, postural dysfunction, and pain.   ACTIVITY LIMITATIONS: carrying, lifting, bending, sitting, standing, squatting, sleeping, transfers, bed mobility, bathing, dressing, hygiene/grooming, and locomotion level  PARTICIPATION LIMITATIONS: cleaning, laundry, driving, shopping, and community activity  PERSONAL FACTORS: 1-2 comorbidities: history of multiple strokes, cardiac history, on disability, time since onset are also affecting patient's functional outcome.   REHAB POTENTIAL: Excellent  CLINICAL DECISION MAKING: Evolving/moderate complexity  EVALUATION COMPLEXITY: Moderate   GOALS: Goals reviewed with patient? Yes  SHORT TERM GOALS: Target date: 06/25/24  Pt will be ind with initial HEP for both land and aquatic environments without exacerbation of pain Baseline: Goal status: MET 07/26/2024  2.  Pt will improve 5x STS to 45 sec or less using proper body mechanics and mod use of UE Baseline:   Goal status: MET 08/02/2024  3.  Pt will improve lumbar ROM to allow greater ease for bending to don shoes/socks Baseline:  Goal status: IN PROGRESS (depends on the day 08/02/2024  4.  Pt will report improved pain to 7-8/10 with ADLs Baseline: 9-10/10 Goal status: MET (6-7/10) 08/02/2024  5.  Pt will be able to perform aquatic ambulation for 10 min with brief rests as needed to work on cardiovascular endurance and functional ROM/strength of bil LE Baseline:  Goal status: MET 08/02/2024    LONG TERM GOALS: Target date: 09/13/2024   Pt will be ind with advanced/final HEP for aquatic and land based HEP to maintain gains made in PT Baseline:  Goal status: IN PROGRESS 08/02/2024  2.  LEFS score to improve by at least 8 points to demo  reduced limitations Baseline: 19/80 Goal status: IN PROGRESS (2 point increase) 08/02/2024  3.  TUG test to improve to 22 sec or less to demo improved functional transfer and gait safety and mobility. Baseline: 27.29 Goal status: MET 08/02/2024  4.  Pt will improve Rt quad and hip flexor mobility to allow for full stride length of Lt LE for more efficient smooth gait Baseline:  Goal status: IN PROGRESS 08/02/2024  5.  Pt will perform in 25 sec or less to demo improved gait speed for community access. Baseline:  Goal status: MET 08/02/2024  6.  Pt will achieve at least 4/5 Rt LE strength to allow for improved stairs to access housing options with stairs. Baseline:  Goal status: IN PROGRESS 08/02/2024   7.  TUG test to improve to 12sec or less to demo improved functional transfer and gait safety and mobility.  Baseline  Goal status: NEW  PLAN:  PT FREQUENCY: 1x/week  PT DURATION: 6 weeks  PLANNED INTERVENTIONS: 97110-Therapeutic exercises, 97530- Therapeutic activity, 97112- Neuromuscular re-education, 97535- Self Care, [id number]- Manual therapy, [phone]- Gait training, [phone]- Aquatic Therapy, [phone]- Electrical stimulation (unattended), [phone]-  Traction (mechanical), [phone] (1-2 muscles), [phone] (3+ muscles)- Dry Needling, Patient/Family education, Balance training, Stair training, Taping, Joint mobilization, Spinal mobilization, DME instructions, Cryotherapy, and Moist heat.  PLAN FOR NEXT SESSION: walking for endurance and LE mobility, Rt LE mobility into hip ext, Rt knee flexion, hamstring stretching, Rt hip ER (Rt hemipelvis is IR with in-toeing - has some spasticity), land: review HEP, try gentle stretching of Rt hip toward ER, ext, knee flexion for anterior thigh and hip stretching, gentle spine ROM, manual therapy, sit to stand  Kristeen Sar, PT 08/02/24 4:30 PM

## 2024-08-09 ENCOUNTER — Encounter: Payer: Self-pay | Admitting: Physical Therapy

## 2024-08-09 ENCOUNTER — Ambulatory Visit (INDEPENDENT_AMBULATORY_CARE_PROVIDER_SITE_OTHER)

## 2024-08-09 ENCOUNTER — Encounter

## 2024-08-09 ENCOUNTER — Ambulatory Visit: Payer: Self-pay | Admitting: Cardiology

## 2024-08-09 ENCOUNTER — Ambulatory Visit: Admitting: Physical Therapy

## 2024-08-09 DIAGNOSIS — M533 Sacrococcygeal disorders, not elsewhere classified: Secondary | ICD-10-CM

## 2024-08-09 DIAGNOSIS — I639 Cerebral infarction, unspecified: Secondary | ICD-10-CM | POA: Diagnosis not present

## 2024-08-09 DIAGNOSIS — M6281 Muscle weakness (generalized): Secondary | ICD-10-CM

## 2024-08-09 DIAGNOSIS — M25551 Pain in right hip: Secondary | ICD-10-CM

## 2024-08-09 LAB — CUP PACEART REMOTE DEVICE CHECK
Date Time Interrogation Session: 20251015232244
Implantable Pulse Generator Implant Date: 20230106

## 2024-08-09 NOTE — Therapy (Signed)
 OUTPATIENT PHYSICAL THERAPY THORACOLUMBAR TREATMENT   Patient Name: Megan Chaney MRN: [id number] DOB:January 23, 1971, 53 y.o., female Today's Date: 08/09/2024  END OF SESSION:  PT End of Session - 08/09/24 1404     Visit Number 7    Date for Recertification  09/13/24    Authorization Type [employer] approved 4 visits 07/27/24-08/25/24    Authorization Time Period 07/27/24-08/25/24    Authorization - Number of Visits 5    Progress Note Due on Visit 10    PT Start Time 1400    PT Stop Time 1447    PT Time Calculation (min) 47 min    Activity Tolerance Patient tolerated treatment well    Behavior During Therapy [facility] for tasks assessed/performed              Past Medical History:  Diagnosis Date   Acute deep vein thrombosis (DVT) of right upper extremity (HCC) 11/03/2021   Anemia    Fibroid    GERD (gastroesophageal reflux disease)    Past Surgical History:  Procedure Laterality Date   BUBBLE STUDY  10/30/2021   Procedure: BUBBLE STUDY;  Surgeon: Delford Maude BROCKS, MD;  Location: [facility] ENDOSCOPY;  Service: Cardiovascular;;   LOOP RECORDER INSERTION N/A 10/30/2021   Procedure: LOOP RECORDER INSERTION;  Surgeon: Cindie Ole DASEN, MD;  Location: [facility] INVASIVE CV LAB;  Service: Cardiovascular;  Laterality: N/A;   MULTIPLE TOOTH EXTRACTIONS     NO PAST SURGERIES     TEE WITHOUT CARDIOVERSION N/A 10/30/2021   Procedure: TRANSESOPHAGEAL ECHOCARDIOGRAM (TEE);  Surgeon: Delford Maude BROCKS, MD;  Location: [facility] ENDOSCOPY;  Service: Cardiovascular;  Laterality: N/A;   Patient Active Problem List   Diagnosis Date Noted   Anxiety 07/08/2024   Hemiplegia of dominant side as late effect of cerebrovascular disease (HCC) 07/08/2024   AVM (arteriovenous malformation) 07/18/2023   Myofascial pain dysfunction syndrome 02/15/2023   Patent foramen ovale 12/17/2022   IUD (intrauterine device) in place 05/06/2022   Hyperlipidemia 11/03/2021   Right sided weakness 11/03/2021   Symptomatic anemia 11/03/2021    History of CVA (cerebrovascular accident) 10/26/2021   Infertility counseling 10/04/2019   Enlarged uterus 07/31/2018   Uterine leiomyoma 07/31/2018   DUB (dysfunctional uterine bleeding) 07/31/2018    PCP: Leita Blind, MD  REFERRING PROVIDER: Catherene Toribio Pac, MD  REFERRING DIAG: M53.3 (ICD-10-CM) - Sacrococcygeal disorders, not elsewhere classified  Rationale for Evaluation and Treatment: Rehabilitation  THERAPY DIAG:  Sacrococcygeal disorders, not elsewhere classified  Pain in right hip  Muscle weakness (generalized)  ONSET DATE: chronic  SUBJECTIVE:  SUBJECTIVE STATEMENT: I had a nerve study done and I have a pinched nerve affecting the left leg.  I will go back to pain management MD next Thurs to discuss the results and a plan.  POOL ACCESS: currently none.   From initial evaluation:  Pt referred to OPPT for aquatic therapy with option to transition to land-based therapy as tolerated.  Pt fell two times on the same day in 2023 and was discovered to have sustained a stroke that day. History of multiple strokes and has Rt > Lt chronic global back pain (head to low back) which she attributes to the falls, sacroiliac pain bil and Rt hip pain.  Has done extensive aquatic PT in past which really helped.  Injections in 2024 helped for about 6 mos.  Will be getting repeat SI injections 06/04/24.  Is on disability. Pt aims to walk 15 min 2-4x/week.  PERTINENT HISTORY:  significant history of multiple strokes - has undergone extensive PT for stroke rehabilitation and low back pain management including aquatic PT, which has provided some relief  10/27/21 Multifocal acute/subacute nonhemorrhagic infarcts involving the left MCA territory.  Previous injections:  September 2024 B/L SI joint injection -  provided relief for 3-6 mos, will be scheduling repeat injections  June 2024 Rt greater trochanteric bursa  currently taking Lyrica  and Baclofen for neuropathic pain History of cardiac cath  Has loop recorder  PAIN:  PAIN:  Are you having pain? Yes NPRS scale: 4-5/10  Pain location: bil LBP and SI joint pain down into LLE PAIN TYPE: aching, dull, sharp, shooting back pains Pain description: intermittent  Aggravating factors: laying down, bending Relieving factors: medications, sleep   PRECAUTIONS: None  RED FLAGS: None  WEIGHT BEARING RESTRICTIONS: No  FALLS:  Has patient fallen in last 6 months? No but has a fear of falling  LIVING ENVIRONMENT: Lives with: lives with roommates - is looking for housing Lives in: House/apartment Stairs: No Has following equipment at home: Single point cane but isn't using it lately - does better without it  OCCUPATION: disabled  PLOF: Independent with basic ADLs, Independent with household mobility without device, Independent with community mobility without device, and Independent with transfers  PATIENT GOALS: learn an aquatic and land based HEP to help manage pain and become less dependent on medications/injections  NEXT MD VISIT: 06/04/24 for bil SI injections  OBJECTIVE:  Note: Objective measures were completed at Evaluation unless otherwise noted.  DIAGNOSTIC FINDINGS:  MRI LUMBAR SPINE WITHOUT CONTRAST  L4-5: Moderate central narrowing of the thecal sac along with mild left and borderline right subarticular lateral recess stenosis due to central disc protrusion, disc bulge, and short pedicles.  PATIENT SURVEYS:  LEFS  Extreme difficulty/unable (0), Quite a bit of difficulty (1), Moderate difficulty (2), Little difficulty (3), No difficulty (4) Survey date:  05/28/24  Any of your usual work, housework or school activities 3  2. Usual hobbies, recreational or sporting activities 1  3. Getting into/out of the bath 2  4.  Walking between rooms 2  5. Putting on socks/shoes 1  6. Squatting  1  7. Lifting an object, like a bag of groceries from the floor 1  8. Performing light activities around your home 2  9. Performing heavy activities around your home 1  10. Getting into/out of a car 1  11. Walking 2 blocks 1  12. Walking 1 mile 0  13. Going up/down 10 stairs (1 flight) 0  14. Standing for 1 hour 0  15.  sitting for 1 hour 2  16. Running on even ground 0  17. Running on uneven ground 0  18. Making sharp turns while running fast 0  19. Hopping  0  20. Rolling over in bed 1  Score total:  19/80     08/02/2024 LEFS 21/80 26.3%  COGNITION: Overall cognitive status: Within functional limits for tasks assessed     SENSATION: Intermittent shooting pain with numbness into toes  MUSCLE LENGTH: Hamstrings: Right 60 with mild spasticity deg; Left 70 deg Thomas test: Right very tight can't get to 0 deg - flexed to 15 deg Right gluteals, piriformis limited 50% compared to [employer] and hip flexor on Rt signif limited  POSTURE: Rt pelvic inflare with Rt LE IR with in-toeing, some spasticity contributing  PALPATION: Signif TTP Rt>Lt SI joint Signif tension/hypertonicity in Rt adductors, hamstrings, proximal quads/hip flexors, hip internal rotators Significant tension and spasm present in trunk paraspinals thoracic to lumbar bil Hypomobile Rt>Lt SI joints with pain  LUMBAR ROM:   AROM eval  Flexion Braces hands on thighs and walks hands down to mid-thigh without lumbar reversal, signif pain  Extension NT  Right lateral flexion 75% limitated  Left lateral flexion 75% limitated  Right rotation 75% limitated  Left rotation 75% limitated   (Blank rows = not tested)  LOWER EXTREMITY ROM:    Rt hip ER 35 deg Rt hip ext to neutral - very tight hip flexors and quads   LOWER EXTREMITY MMT:    MMT Right eval Left eval  Hip flexion 3+ 4+  Hip extension 3+ 4  Hip abduction 3+ 4  Hip adduction 3+ 4   Hip internal rotation 4- 4  Hip external rotation 3+ 4  Knee flexion 3+ 4  Knee extension 4- 4  Ankle dorsiflexion 4 4  Ankle plantarflexion 4 4  Ankle inversion    Ankle eversion     (Blank rows = not tested)  LUMBAR SPECIAL TESTS:  SI Compression/distraction test: Positive and FABER test: Positive  FUNCTIONAL TESTS:  5 times sit to stand: 1:01.07 signif use of hands on chair, avoids hip hinge due to pain Timed up and go (TUG): 27.29 no AD : 32.28 sec no AD  08/02/2024 5STS:28.45 sec (unilateral hand support) TUG:14.74 sec no AD walk test: 15.69 sec  GAIT: Distance walked: 30 feet Assistive device utilized: None Level of assistance: Modified independence Comments: Rt LE IR with in-toeing, short stance time on Rt, small stride length bil, lacks trunk rotation  TREATMENT DATE:  [facility] Adult PT Treatment:                                              08/09/24 NuStep L4 x 5' PT present to discuss status 3-way lumbar stretch 5x5 each way Doorway Lt QL stretch 2x20 Supine figure four (push knee away) and piriformis stretch (pull knee across) 3 x20 sec each bil Supine lower trunk rotation 3x20 bil Supine hamstring and adductor stretch with strap 2x20 each Sit to stand chair + pad hands on thigh  - PT cued body mechanics for hip hinge and TA indraw - much less pressure in back with this Seated march alt LE x20 Step ups with bil rail lead with each LE x8 reps - PT cued shifting body weight into Rt LE on step before stepping up to avoid  overuse of bil UE Manual therapy: Lt QL and lumbar paraspinal broadening in Rt SL  08/02/2024 NuStep Level 3 5 mins- PT present to discuss status LEFS 21/80 26.3% Goal Assessment & Plan for the next 6 weeks 5STS:28.45 sec (unilateral hand support) TUG:14.74 sec no AD walk test: 15.69 sec 3 way stability ball stretch x 8 each direction Seated figure four 2 x 30 sec bilateral  Doorway WL stretch x 20 sec bilateral  Seated  sciatic nerve glide x 10    Date: 07/17/24 Pt seen for aquatic therapy today.  Treatment took place in water 3.5-4.75 ft in depth at the [facility] pool. Temp of water was 91.  Pt entered/exited the pool via stairs independently with bilat rail.  - UE on barbell walking forward/ backward with cues for increased R step height and length- 3 laps;  - UE on barbell side stepping  -> with arm horz abdct/ add on surface with yellow hand floats- 2 laps, -> shoulder add/abdct with rainbow hand floats-> short hollow noodles (improved tolerance) - UE on wall: leg swings into hip flexion/ extension x 10 each LE; alternating single leg clams x 8 each; hip circles CW/ CCW x 5 each LE, hip abdct/add  - marching backward/forward with row motion with rainbow hand floats -> suitcase carry with bil / single rainbow hand float under water at side - UE on rainbow hand floats:  tandem gait forward/ backward;  hip abdct/ add 2 x5 - straddling noodle with UE support: cycling, hip abdct/add and hip flex/extension, cycling  [facility] Adult PT Treatment:                                             Date: 06/26/24 Pt seen for aquatic therapy today.  Treatment took place in water 3.5-4.75 ft in depth at the [facility] pool. Temp of water was 91.  Pt entered/exited the pool via stairs independently with bilat rail.  - UE on barbell walking forward/ backward - 3 laps;  - UE on barbell side stepping with cues for increased R step height -> with arm horz abdct/ add with yellow hand floats  - UE on wall: leg swings into hip flexion/ extension x 8 each LE; alternating single leg clams x 5 each  - marching backward/forward with row motion with yellow hand floats  - squatted rest with UE on wall, bil clams - straddling noodle with UE support: cycling   06/13/2024 NuStep 3 6 mins- PT present to discuss status Seated sciatic nerve glide x 10 Seated cat cow x 10 LTR with legs supported on red stability ball x 8  each direction Hooklying single knee to chest 2 x 20 sec bilateral  Seated purple ball press x 10 Seated alt hand and knee press with purple ball x 10 bilateral  Seated unilateral clam with yellow loop x 10 bilateral  Seated march with yellow loop x 20  Sit to stand with yellow loop x 10    PATIENT EDUCATION:  Education details: [email or web address] Person educated: Patient Education method: Explanation Education comprehension: verbalized understanding  HOME EXERCISE PROGRAM: Access Code: [email or web address] URL: [email or web address] Date: 05/28/2024 Prepared by: Orvil Uchechi [place]  Exercises - Hooklying Single Knee to Chest Stretch  - 1 x daily - 7 x weekly - 3 sets - 10 reps - Supine Posterior Pelvic Tilt  - 1 x daily -  7 x weekly - 3 sets - 10 reps - Supine Figure 4 Piriformis Stretch  - 1 x daily - 7 x weekly - 3 sets - 10 reps - Supine Piriformis Stretch with Foot on Ground  - 1 x daily - 7 x weekly - 3 sets - 10 reps - Sidelying Transversus Abdominis Bracing  - 1 x daily - 7 x weekly - 1 sets - 10 reps - 5 hold  ASSESSMENT:  CLINICAL IMPRESSION: Gorgeous had gotten away from her initial HEP secondary to outside medical complications.  She reported results of a nerve study showed some nerve impingement affecting Lt LE.  She felt much relief with various stretches today targeting LE and trunk.  She needed heavy cueing for body mechanics with weight shifts for sit to stand and step ups.  She is very weak in Rt LE especially noted in closed chain functional tasks like sit to stand and step ups.  She continues to be very tight in Lt lumbar soft tissues so we tried some STM end of session.     From initial evaluation:  Patient is a 53 y.o. female with complex medical history who was seen today for physical therapy evaluation and treatment for chronic global back pain and Rt LE pain and weakness.  She had two falls on the same day in 2023 and was discovered to have had a stroke.  She is on  disability and is in temporary housing arrangements.  Assessment today reveals significant restriction in Rt hemipelvis, hip and knee related to some hypertonicity/spasticity in Rt LE.  Pt has very restricted Rt>Lt SI joint mobility.  She performs transfers and ambulates slowly secondary to significant pain which she rates as 9-10/10.  Her times tests for 5xSTS, TUG, and support that she is at high risk for falls.  Her LEFS score is 19/80 which is signif disability.  She is scheduled to get repeat injections into bil SI joints on 06/04/24 as these gave her several months of relief last year.  Pt has had aquatic PT in the past which was very helpful. We discussed a hybrid plan of land-based and aquatic-based PT with more efforts to get into aquatic PT schedule on the front end of PT given her pain levels and functional mobility restrictions.  Pt will benefit from skilled PT to maximize her functional strength, mobility, gait and safety, along with educate her on aquatic and land based HEP to support gains made in PT.  OBJECTIVE IMPAIRMENTS: Abnormal gait, decreased activity tolerance, decreased coordination, decreased endurance, decreased mobility, difficulty walking, decreased ROM, decreased strength, hypomobility, increased muscle spasms, impaired flexibility, impaired sensation, impaired tone, improper body mechanics, postural dysfunction, and pain.   ACTIVITY LIMITATIONS: carrying, lifting, bending, sitting, standing, squatting, sleeping, transfers, bed mobility, bathing, dressing, hygiene/grooming, and locomotion level  PARTICIPATION LIMITATIONS: cleaning, laundry, driving, shopping, and community activity  PERSONAL FACTORS: 1-2 comorbidities: history of multiple strokes, cardiac history, on disability, time since onset are also affecting patient's functional outcome.   REHAB POTENTIAL: Excellent  CLINICAL DECISION MAKING: Evolving/moderate complexity  EVALUATION COMPLEXITY:  Moderate   GOALS: Goals reviewed with patient? Yes  SHORT TERM GOALS: Target date: 06/25/24  Pt will be ind with initial HEP for both land and aquatic environments without exacerbation of pain Baseline: Goal status: MET 07/26/2024  2.  Pt will improve 5x STS to 45 sec or less using proper body mechanics and mod use of UE Baseline:  Goal status: MET 08/02/2024  3.  Pt will improve lumbar ROM to allow greater ease for bending to don shoes/socks Baseline:  Goal status: IN PROGRESS (depends on the day 08/02/2024  4.  Pt will report improved pain to 7-8/10 with ADLs Baseline: 9-10/10 Goal status: MET (6-7/10) 08/02/2024  5.  Pt will be able to perform aquatic ambulation for 10 min with brief rests as needed to work on cardiovascular endurance and functional ROM/strength of bil LE Baseline:  Goal status: MET 08/02/2024    LONG TERM GOALS: Target date: 09/13/2024   Pt will be ind with advanced/final HEP for aquatic and land based HEP to maintain gains made in PT Baseline:  Goal status: IN PROGRESS 08/02/2024  2.  LEFS score to improve by at least 8 points to demo reduced limitations Baseline: 19/80 Goal status: IN PROGRESS (2 point increase) 08/02/2024  3.  TUG test to improve to 22 sec or less to demo improved functional transfer and gait safety and mobility. Baseline: 27.29 Goal status: MET 08/02/2024  4.  Pt will improve Rt quad and hip flexor mobility to allow for full stride length of Lt LE for more efficient smooth gait Baseline:  Goal status: IN PROGRESS 08/02/2024  5.  Pt will perform in 25 sec or less to demo improved gait speed for community access. Baseline:  Goal status: MET 08/02/2024  6.  Pt will achieve at least 4/5 Rt LE strength to allow for improved stairs to access housing options with stairs. Baseline:  Goal status: IN PROGRESS 08/02/2024   7.  TUG test to improve to 12sec or less to demo improved functional transfer and gait safety and  mobility.  Baseline  Goal status: NEW  PLAN:  PT FREQUENCY: 1x/week  PT DURATION: 6 weeks  PLANNED INTERVENTIONS: 97110-Therapeutic exercises, 97530- Therapeutic activity, 97112- Neuromuscular re-education, 97535- Self Care, [id number]- Manual therapy, [phone]- Gait training, [phone]- Aquatic Therapy, [phone]- Electrical stimulation (unattended), [phone]- Traction (mechanical), [phone] (1-2 muscles), [phone] (3+ muscles)- Dry Needling, Patient/Family education, Balance training, Stair training, Taping, Joint mobilization, Spinal mobilization, DME instructions, Cryotherapy, and Moist heat.  PLAN FOR NEXT SESSION: walking for endurance and LE mobility, Rt LE mobility into hip ext, Rt knee flexion, hamstring stretching, Rt hip ER (Rt hemipelvis is IR with in-toeing - has some spasticity), land: review HEP, try gentle stretching of Rt hip toward ER, ext, knee flexion for anterior thigh and hip stretching, gentle spine ROM, manual therapy, sit to stand  [employer], PT 08/09/24 3:09 PM

## 2024-08-13 ENCOUNTER — Encounter: Payer: Self-pay | Admitting: Physical Therapy

## 2024-08-13 ENCOUNTER — Ambulatory Visit: Admitting: Physical Therapy

## 2024-08-13 DIAGNOSIS — M533 Sacrococcygeal disorders, not elsewhere classified: Secondary | ICD-10-CM

## 2024-08-13 DIAGNOSIS — R2681 Unsteadiness on feet: Secondary | ICD-10-CM

## 2024-08-13 DIAGNOSIS — M25551 Pain in right hip: Secondary | ICD-10-CM

## 2024-08-13 DIAGNOSIS — M6281 Muscle weakness (generalized): Secondary | ICD-10-CM

## 2024-08-13 NOTE — Therapy (Signed)
 OUTPATIENT PHYSICAL THERAPY THORACOLUMBAR TREATMENT and PROGRESS/REASSESSMENT NOTE   Progress Note Reporting Period 05/28/24 to 08/13/24  See note below for Objective Data and Assessment of Progress/Goals.      Patient Name: Megan Chaney MRN: [id number] DOB:10-May-1971, 53 y.o., female Today's Date: 08/13/2024  END OF SESSION:  PT End of Session - 08/13/24 1545     Visit Number 8    Date for Recertification  09/13/24    Authorization Type [employer] approved 4 visits 07/27/24-08/25/24 - requesting 6 more visits on 08/13/24    Authorization Time Period 07/27/24-08/25/24    Authorization - Visit Number 3    Authorization - Number of Visits 4    Progress Note Due on Visit 18    PT Start Time 1540    PT Stop Time 1620    PT Time Calculation (min) 40 min    Activity Tolerance Patient tolerated treatment well    Behavior During Therapy [facility] for tasks assessed/performed               Past Medical History:  Diagnosis Date   Acute deep vein thrombosis (DVT) of right upper extremity (HCC) 11/03/2021   Anemia    Fibroid    GERD (gastroesophageal reflux disease)    Past Surgical History:  Procedure Laterality Date   BUBBLE STUDY  10/30/2021   Procedure: BUBBLE STUDY;  Surgeon: Delford Maude BROCKS, MD;  Location: [facility] ENDOSCOPY;  Service: Cardiovascular;;   LOOP RECORDER INSERTION N/A 10/30/2021   Procedure: LOOP RECORDER INSERTION;  Surgeon: Cindie Ole DASEN, MD;  Location: [facility] INVASIVE CV LAB;  Service: Cardiovascular;  Laterality: N/A;   MULTIPLE TOOTH EXTRACTIONS     NO PAST SURGERIES     TEE WITHOUT CARDIOVERSION N/A 10/30/2021   Procedure: TRANSESOPHAGEAL ECHOCARDIOGRAM (TEE);  Surgeon: Delford Maude BROCKS, MD;  Location: [facility] ENDOSCOPY;  Service: Cardiovascular;  Laterality: N/A;   Patient Active Problem List   Diagnosis Date Noted   Anxiety 07/08/2024   Hemiplegia of dominant side as late effect of cerebrovascular disease (HCC) 07/08/2024   AVM (arteriovenous malformation) 07/18/2023    Myofascial pain dysfunction syndrome 02/15/2023   Patent foramen ovale 12/17/2022   IUD (intrauterine device) in place 05/06/2022   Hyperlipidemia 11/03/2021   Right sided weakness 11/03/2021   Symptomatic anemia 11/03/2021   History of CVA (cerebrovascular accident) 10/26/2021   Infertility counseling 10/04/2019   Enlarged uterus 07/31/2018   Uterine leiomyoma 07/31/2018   DUB (dysfunctional uterine bleeding) 07/31/2018    PCP: Leita Blind, MD  REFERRING PROVIDER: Catherene Toribio Pac, MD  REFERRING DIAG: M53.3 (ICD-10-CM) - Sacrococcygeal disorders, not elsewhere classified  Rationale for Evaluation and Treatment: Rehabilitation  THERAPY DIAG:  Sacrococcygeal disorders, not elsewhere classified  Pain in right hip  Muscle weakness (generalized)  Unsteadiness on feet  ONSET DATE: chronic  SUBJECTIVE:  SUBJECTIVE STATEMENT: I am about 80% better from when I first came to PT.  I have some days that are better than others.  My back pain is down to a 4/10 and left leg is about a 7/10. My original pain was more in the Rt hip but that is quite a bit better.  POOL ACCESS: currently none.   From initial evaluation:  Pt referred to OPPT for aquatic therapy with option to transition to land-based therapy as tolerated.  Pt fell two times on the same day in 2023 and was discovered to have sustained a stroke that day. History of multiple strokes and has Rt > Lt chronic global back pain (head to low back) which she attributes to the falls, sacroiliac pain bil and Rt hip pain.  Has done extensive aquatic PT in past which really helped.  Injections in 2024 helped for about 6 mos.  Will be getting repeat SI injections 06/04/24.  Is on disability. Pt aims to walk 15 min 2-4x/week.  PERTINENT HISTORY:   significant history of multiple strokes - has undergone extensive PT for stroke rehabilitation and low back pain management including aquatic PT, which has provided some relief  10/27/21 Multifocal acute/subacute nonhemorrhagic infarcts involving the left MCA territory.  Previous injections:  September 2024 B/L SI joint injection - provided relief for 3-6 mos, will be scheduling repeat injections  June 2024 Rt greater trochanteric bursa  currently taking Lyrica  and Baclofen for neuropathic pain History of cardiac cath  Has loop recorder  PAIN:  PAIN:  Are you having pain? Yes NPRS scale: 4/10 back, Lt LE 7/10 (on fire) Pain location: bil LBP and SI joint pain down into LLE PAIN TYPE: aching, dull, sharp, shooting back pains Pain description: intermittent  Aggravating factors: laying down, bending Relieving factors: medications, sleep   PRECAUTIONS: None  RED FLAGS: None  WEIGHT BEARING RESTRICTIONS: No  FALLS:  Has patient fallen in last 6 months? No but has a fear of falling  LIVING ENVIRONMENT: Lives with: lives with roommates - is looking for housing Lives in: House/apartment Stairs: No Has following equipment at home: Single point cane but isn't using it lately - does better without it  OCCUPATION: disabled  PLOF: Independent with basic ADLs, Independent with household mobility without device, Independent with community mobility without device, and Independent with transfers  PATIENT GOALS: learn an aquatic and land based HEP to help manage pain and become less dependent on medications/injections  NEXT MD VISIT: 06/04/24 for bil SI injections  OBJECTIVE:  Note: Objective measures were completed at Evaluation unless otherwise noted.  DIAGNOSTIC FINDINGS:  MRI LUMBAR SPINE WITHOUT CONTRAST  L4-5: Moderate central narrowing of the thecal sac along with mild left and borderline right subarticular lateral recess stenosis due to central disc protrusion, disc  bulge, and short pedicles.  PATIENT SURVEYS:  LEFS  Extreme difficulty/unable (0), Quite a bit of difficulty (1), Moderate difficulty (2), Little difficulty (3), No difficulty (4) Survey date:  05/28/24 08/13/24  Any of your usual work, housework or school activities 3 3  2. Usual hobbies, recreational or sporting activities 1 1  3. Getting into/out of the bath 2 3  4. Walking between rooms 2 2  5. Putting on socks/shoes 1 3  6. Squatting  1 2  7. Lifting an object, like a bag of groceries from the floor 1 2  8. Performing light activities around your home 2 4  9. Performing heavy activities around your home 1 1  10. Getting into/out of a car 1 2  11. Walking 2 blocks 1 3  12. Walking 1 mile 0 0  13. Going up/down 10 stairs (1 flight) 0 2  14. Standing for 1 hour 0 0  15.  sitting for 1 hour 2 2  16. Running on even ground 0 0  17. Running on uneven ground 0 0  18. Making sharp turns while running fast 0 0  19. Hopping  0 0  20. Rolling over in bed 1 3  Score total:  19/80 32/80     08/02/2024 LEFS 21/80 26.3%  08/13/24 LEFS 32/80 = 40%  COGNITION: Overall cognitive status: Within functional limits for tasks assessed     SENSATION: Intermittent shooting pain with numbness into toes  MUSCLE LENGTH: 08/13/24: Hamstrings: Right 70 with mild spasticity deg; Left 80 deg Thomas test: negative bil - much improved hip flexor and quad length Right gluteals, piriformis symmetrical bil   Eval: Hamstrings: Right 60 with mild spasticity deg; Left 70 deg Thomas test: Right very tight can't get to 0 deg - flexed to 15 deg Right gluteals, piriformis limited 50% compared to [employer] and hip flexor on Rt signif limited  POSTURE: Rt pelvic inflare with Rt LE IR with in-toeing, some spasticity contributing  PALPATION: 08/13/24 Ongoing tightness and tenderness in Lt hamstring distally behind knee, Lt QL, mild tenderness over bil SI joints  Eval: Signif TTP Rt>Lt SI joint Signif  tension/hypertonicity in Rt adductors, hamstrings, proximal quads/hip flexors, hip internal rotators Significant tension and spasm present in trunk paraspinals thoracic to lumbar bil Hypomobile Rt>Lt SI joints with pain  LUMBAR ROM:   AROM eval 08/13/24  Flexion Braces hands on thighs and walks hands down to mid-thigh without lumbar reversal, signif pain Braces hands on thighs, lumbar flexion to hands at mid-thighs  Extension NT   Right lateral flexion 75% limitated Achieves 75%  Left lateral flexion 75% limitated Achieves 75%  Right rotation 75% limitated Achieves 50%  Left rotation 75% limitated Achieves 50%   (Blank rows = not tested)  LOWER EXTREMITY ROM:    08/13/24: Bil hip ER 50 deg Rt hip ext to 10 deg before hip flexor stretch   Eval: Rt hip ER 35 deg Rt hip ext to neutral - very tight hip flexors and quads   LOWER EXTREMITY MMT:    MMT Right eval Left eval Right 08/13/24 Left 10/20(25  Hip flexion 3+ 4+ 3+ 4+  Hip extension 3+ 4 4- 4  Hip abduction 3+ 4 4- 4+  Hip adduction 3+ 4 4 4+  Hip internal rotation 4- 4 4 4+  Hip external rotation 3+ 4 4- 4+  Knee flexion 3+ 4 4+ 4  Knee extension 4- 4 4+ 4+  Ankle dorsiflexion 4 4 4+ 4+  Ankle plantarflexion 4 4 4 4   Ankle inversion      Ankle eversion       (Blank rows = not tested)  LUMBAR SPECIAL TESTS:  SI Compression/distraction test: Positive and FABER test: Positive  FUNCTIONAL TESTS:  Eval: 5 times sit to stand: 1:01.07 signif use of hands on chair, avoids hip hinge due to pain Timed up and go (TUG): 27.29 no AD : 32.28 sec no AD  08/02/2024 5STS:28.45 sec (unilateral hand support) TUG:14.74 sec no AD walk test: 15.69 sec  08/13/24 5STS: 27.33 sec (unilateral hand support) TUG: 15.57 sec no AD walk test: 12.63  GAIT: Distance walked: 30 feet Assistive device utilized:  None Level of assistance: Modified independence Comments: Rt LE IR with in-toeing, short stance time  on Rt, small stride length bil, lacks trunk rotation  TREATMENT DATE:  [facility] Adult PT Treatment:                                              08/13/24 Reassessment (see above) Supine figure four (push knee away) and piriformis stretch (pull knee across) 3 x20 sec each bil Supine lower trunk rotation 3x20 bil Supine hamstring and adductor stretch with strap 2x20 each Sit to stand with hands on thighs x5 with focus on body mechanics  08/09/24 NuStep L4 x 5' PT present to discuss status 3-way lumbar stretch 5x5 each way Doorway Lt QL stretch 2x20 Supine figure four (push knee away) and piriformis stretch (pull knee across) 3 x20 sec each bil Supine lower trunk rotation 3x20 bil Supine hamstring and adductor stretch with strap 2x20 each Sit to stand chair + pad hands on thigh  - PT cued body mechanics for hip hinge and TA indraw - much less pressure in back with this Seated march alt LE x20 Step ups with bil rail lead with each LE x8 reps - PT cued shifting body weight into Rt LE on step before stepping up to avoid overuse of bil UE Manual therapy: Lt QL and lumbar paraspinal broadening in Rt SL  08/02/2024 NuStep Level 3 5 mins- PT present to discuss status LEFS 21/80 26.3% Goal Assessment & Plan for the next 6 weeks 5STS:28.45 sec (unilateral hand support) TUG:14.74 sec no AD walk test: 15.69 sec 3 way stability ball stretch x 8 each direction Seated figure four 2 x 30 sec bilateral  Doorway WL stretch x 20 sec bilateral  Seated sciatic nerve glide x 10    Date: 07/17/24 Pt seen for aquatic therapy today.  Treatment took place in water 3.5-4.75 ft in depth at the [facility] pool. Temp of water was 91.  Pt entered/exited the pool via stairs independently with bilat rail.  - UE on barbell walking forward/ backward with cues for increased R step height and length- 3 laps;  - UE on barbell side stepping  -> with arm horz abdct/ add on surface with yellow hand  floats- 2 laps, -> shoulder add/abdct with rainbow hand floats-> short hollow noodles (improved tolerance) - UE on wall: leg swings into hip flexion/ extension x 10 each LE; alternating single leg clams x 8 each; hip circles CW/ CCW x 5 each LE, hip abdct/add  - marching backward/forward with row motion with rainbow hand floats -> suitcase carry with bil / single rainbow hand float under water at side - UE on rainbow hand floats:  tandem gait forward/ backward;  hip abdct/ add 2 x5 - straddling noodle with UE support: cycling, hip abdct/add and hip flex/extension, cycling   PATIENT EDUCATION:  Education details: [email or web address] Person educated: Patient Education method: Explanation Education comprehension: verbalized understanding  HOME EXERCISE PROGRAM: Access Code: [email or web address] URL: [email or web address] Date: 08/13/2024 Prepared by: Orvil Shatia Sindoni  Exercises - Hooklying Single Knee to Chest Stretch  - 1 x daily - 7 x weekly - 3 sets - 10 reps - Supine Posterior Pelvic Tilt  - 1 x daily - 7 x weekly - 3 sets - 10 reps - Supine Figure 4 Piriformis Stretch  - 1 x daily - 7 x  weekly - 3 sets - 10 reps - Supine Piriformis Stretch with Foot on Ground  - 1 x daily - 7 x weekly - 3 sets - 10 reps - Sidelying Transversus Abdominis Bracing  - 1 x daily - 7 x weekly - 1 sets - 10 reps - 5 hold - Supine Hamstring Stretch with Strap  - 1 x daily - 7 x weekly - 2 sets - 2 reps - 30 hold - Modified Thomas Stretch  - 1 x daily - 7 x weekly - 1 sets - 2 reps - 30 hold - Sit to Stand  - 2 x daily - 7 x weekly - 1 sets - 5 reps  ASSESSMENT:  CLINICAL IMPRESSION: Jacalyn is making excellent progress.  She has met all STGs and some LTGs today.  She reports 80% improvement from start of therapy.  She greatly benefits from aquatic and land combination for therapy to maximize tolerance of interventions for improved mobility, flexibility and strength.  She demos a more neutral position of Rt LE away  from IR and in-toeing today.  Her objective functional measures for 5x STS, TUG and gait speed with 10 meter walk test have all significantly improved.  She continues to be stiff throughout her spine and has very limited flexion ROM making it challenging to don shoes and socks and reach for floor object.  Her strength of bil LE has all improved by at least 1/2 a grade.  She will benefit from 6 more visits with hybrid approach of aquatic and land to finalize her HEP as she still requires skilled guidance for proper form and needs further progression now that mobility and flexibility have improved.    From initial evaluation:  Patient is a 53 y.o. female with complex medical history who was seen today for physical therapy evaluation and treatment for chronic global back pain and Rt LE pain and weakness.  She had two falls on the same day in 2023 and was discovered to have had a stroke.  She is on disability and is in temporary housing arrangements.  Assessment today reveals significant restriction in Rt hemipelvis, hip and knee related to some hypertonicity/spasticity in Rt LE.  Pt has very restricted Rt>Lt SI joint mobility.  She performs transfers and ambulates slowly secondary to significant pain which she rates as 9-10/10.  Her times tests for 5xSTS, TUG, and support that she is at high risk for falls.  Her LEFS score is 19/80 which is signif disability.  She is scheduled to get repeat injections into bil SI joints on 06/04/24 as these gave her several months of relief last year.  Pt has had aquatic PT in the past which was very helpful. We discussed a hybrid plan of land-based and aquatic-based PT with more efforts to get into aquatic PT schedule on the front end of PT given her pain levels and functional mobility restrictions.  Pt will benefit from skilled PT to maximize her functional strength, mobility, gait and safety, along with educate her on aquatic and land based HEP to support gains made in  PT.  OBJECTIVE IMPAIRMENTS: Abnormal gait, decreased activity tolerance, decreased coordination, decreased endurance, decreased mobility, difficulty walking, decreased ROM, decreased strength, hypomobility, increased muscle spasms, impaired flexibility, impaired sensation, impaired tone, improper body mechanics, postural dysfunction, and pain.   ACTIVITY LIMITATIONS: carrying, lifting, bending, sitting, standing, squatting, sleeping, transfers, bed mobility, bathing, dressing, hygiene/grooming, and locomotion level  PARTICIPATION LIMITATIONS: cleaning, laundry, driving, shopping, and community activity  PERSONAL FACTORS: 1-2 comorbidities: history of multiple strokes, cardiac history, on disability, time since onset are also affecting patient's functional outcome.   REHAB POTENTIAL: Excellent  CLINICAL DECISION MAKING: Evolving/moderate complexity  EVALUATION COMPLEXITY: Moderate   GOALS: Goals reviewed with patient? Yes  SHORT TERM GOALS: Target date: 06/25/24  Pt will be ind with initial HEP for both land and aquatic environments without exacerbation of pain Baseline: Goal status: MET 07/26/2024  2.  Pt will improve 5x STS to 45 sec or less using proper body mechanics and mod use of UE Baseline:  Goal status: MET 08/02/2024  3.  Pt will improve lumbar ROM to allow greater ease for bending to don shoes/socks Baseline:  Goal status: PARTIALLY MET - INCONSISTENT DEPENDING ON DAY 08/13/24  4.  Pt will report improved pain to 7-8/10 with ADLs Baseline: 9-10/10 Goal status: MET (6-7/10) 08/02/2024  5.  Pt will be able to perform aquatic ambulation for 10 min with brief rests as needed to work on cardiovascular endurance and functional ROM/strength of bil LE Baseline:  Goal status: MET 08/02/2024    LONG TERM GOALS: Target date: 09/13/2024   Pt will be ind with advanced/final HEP for aquatic and land based HEP to maintain gains made in PT Baseline:  Goal status: IN PROGRESS  08/13/2024  2.  LEFS score to improve by at least 8 points to demo reduced limitations Baseline: 19/80 Goal status: MET 08/13/24  3.  TUG test to improve to 22 sec or less to demo improved functional transfer and gait safety and mobility. Baseline: 27.29 Goal status: MET 08/02/2024  4.  Pt will improve Rt quad and hip flexor mobility to allow for full stride length of Lt LE for more efficient smooth gait Baseline:  Goal status: MET 08/13/24  5.  Pt will perform in 25 sec or less to demo improved gait speed for community access. Baseline:  Goal status: MET 08/02/2024  6.  Pt will achieve at least 4/5 Rt LE strength to allow for improved stairs to access housing options with stairs. Baseline:  Goal status: IN PROGRESS 08/13/2024   7.  TUG test to improve to 12sec or less to demo improved functional transfer and gait safety and mobility.  Baseline 15.57  Goal status: ONGOING 08/13/24  PLAN:  PT FREQUENCY: 2x/week   PT DURATION: 4 weeks  PLANNED INTERVENTIONS: 97110-Therapeutic exercises, 97530- Therapeutic activity, 97112- Neuromuscular re-education, 97535- Self Care, [id number]- Manual therapy, [phone]- Gait training, [phone]- Aquatic Therapy, [phone]- Electrical stimulation (unattended), [phone]- Traction (mechanical), [phone] (1-2 muscles), [phone] (3+ muscles)- Dry Needling, Patient/Family education, Balance training, Stair training, Taping, Joint mobilization, Spinal mobilization, DME instructions, Cryotherapy, and Moist heat.  PLAN FOR NEXT SESSION: aquatic: walking for endurance and LE mobility, Rt LE mobility into hip ext, Rt knee flexion to stretch quad and hip flexor, hamstring stretching, Rt hip ER (Rt hemipelvis is IR with in-toeing - has some spasticity), land: review HEP, try gentle stretching of Rt hip toward ER, ext, knee flexion for anterior thigh and hip stretching, gentle spine ROM, manual therapy, sit to stand  [employer], PT 08/13/24 5:27 PM

## 2024-08-14 ENCOUNTER — Other Ambulatory Visit: Payer: Self-pay | Admitting: Medical Genetics

## 2024-08-14 DIAGNOSIS — Z006 Encounter for examination for normal comparison and control in clinical research program: Secondary | ICD-10-CM

## 2024-08-15 NOTE — Progress Notes (Signed)
 Remote Loop Recorder Transmission

## 2024-08-23 ENCOUNTER — Emergency Department (HOSPITAL_COMMUNITY)

## 2024-08-23 ENCOUNTER — Observation Stay (HOSPITAL_COMMUNITY)
Admission: EM | Admit: 2024-08-23 | Discharge: 2024-08-24 | Disposition: A | Discharge status: Home / Self Care | Attending: Internal Medicine | Admitting: Internal Medicine

## 2024-08-23 ENCOUNTER — Other Ambulatory Visit: Payer: Self-pay

## 2024-08-23 ENCOUNTER — Ambulatory Visit: Admitting: Physical Therapy

## 2024-08-23 ENCOUNTER — Encounter (HOSPITAL_COMMUNITY): Payer: Self-pay

## 2024-08-23 DIAGNOSIS — I10 Essential (primary) hypertension: Secondary | ICD-10-CM | POA: Insufficient documentation

## 2024-08-23 DIAGNOSIS — Z8673 Personal history of transient ischemic attack (TIA), and cerebral infarction without residual deficits: Secondary | ICD-10-CM | POA: Diagnosis not present

## 2024-08-23 DIAGNOSIS — Q2112 Patent foramen ovale: Secondary | ICD-10-CM | POA: Diagnosis not present

## 2024-08-23 DIAGNOSIS — Z8742 Personal history of other diseases of the female genital tract: Secondary | ICD-10-CM | POA: Diagnosis not present

## 2024-08-23 DIAGNOSIS — B9689 Other specified bacterial agents as the cause of diseases classified elsewhere: Secondary | ICD-10-CM

## 2024-08-23 DIAGNOSIS — Z79899 Other long term (current) drug therapy: Secondary | ICD-10-CM | POA: Insufficient documentation

## 2024-08-23 DIAGNOSIS — N920 Excessive and frequent menstruation with regular cycle: Secondary | ICD-10-CM | POA: Diagnosis not present

## 2024-08-23 DIAGNOSIS — N938 Other specified abnormal uterine and vaginal bleeding: Secondary | ICD-10-CM | POA: Diagnosis present

## 2024-08-23 DIAGNOSIS — D62 Acute posthemorrhagic anemia: Secondary | ICD-10-CM | POA: Diagnosis not present

## 2024-08-23 DIAGNOSIS — D649 Anemia, unspecified: Secondary | ICD-10-CM | POA: Diagnosis not present

## 2024-08-23 DIAGNOSIS — Z87891 Personal history of nicotine dependence: Secondary | ICD-10-CM | POA: Diagnosis not present

## 2024-08-23 DIAGNOSIS — M549 Dorsalgia, unspecified: Secondary | ICD-10-CM | POA: Insufficient documentation

## 2024-08-23 DIAGNOSIS — Z7982 Long term (current) use of aspirin: Secondary | ICD-10-CM | POA: Diagnosis not present

## 2024-08-23 DIAGNOSIS — N760 Acute vaginitis: Secondary | ICD-10-CM | POA: Insufficient documentation

## 2024-08-23 DIAGNOSIS — N939 Abnormal uterine and vaginal bleeding, unspecified: Principal | ICD-10-CM

## 2024-08-23 DIAGNOSIS — Z91040 Latex allergy status: Secondary | ICD-10-CM | POA: Diagnosis not present

## 2024-08-23 DIAGNOSIS — Z86718 Personal history of other venous thrombosis and embolism: Secondary | ICD-10-CM | POA: Insufficient documentation

## 2024-08-23 LAB — PROTIME-INR
INR: 1 (ref 0.8–1.2)
Prothrombin Time: 13.2 s (ref 11.4–15.2)

## 2024-08-23 LAB — CBC WITH DIFFERENTIAL/PLATELET
Abs Immature Granulocytes: 0.06 K/uL (ref 0.00–0.07)
Basophils Absolute: 0 K/uL (ref 0.0–0.1)
Basophils Relative: 0 %
Eosinophils Absolute: 0 K/uL (ref 0.0–0.5)
Eosinophils Relative: 0 %
HCT: 23.4 % — ABNORMAL LOW (ref 36.0–46.0)
Hemoglobin: 7.2 g/dL — ABNORMAL LOW (ref 12.0–15.0)
Immature Granulocytes: 1 %
Lymphocytes Relative: 10 %
Lymphs Abs: 1.3 K/uL (ref 0.7–4.0)
MCH: 29.3 pg (ref 26.0–34.0)
MCHC: 30.8 g/dL (ref 30.0–36.0)
MCV: 95.1 fL (ref 80.0–100.0)
Monocytes Absolute: 0.7 K/uL (ref 0.1–1.0)
Monocytes Relative: 6 %
Neutro Abs: 11 K/uL — ABNORMAL HIGH (ref 1.7–7.7)
Neutrophils Relative %: 83 %
Platelets: 227 K/uL (ref 150–400)
RBC: 2.46 MIL/uL — ABNORMAL LOW (ref 3.87–5.11)
RDW: 19.1 % — ABNORMAL HIGH (ref 11.5–15.5)
WBC: 13.1 K/uL — ABNORMAL HIGH (ref 4.0–10.5)
nRBC: 0.2 % (ref 0.0–0.2)

## 2024-08-23 LAB — COMPREHENSIVE METABOLIC PANEL WITH GFR
ALT: 16 U/L (ref 0–44)
AST: 21 U/L (ref 15–41)
Albumin: 3.4 g/dL — ABNORMAL LOW (ref 3.5–5.0)
Alkaline Phosphatase: 55 U/L (ref 38–126)
Anion gap: 10 (ref 5–15)
BUN: 13 mg/dL (ref 6–20)
CO2: 22 mmol/L (ref 22–32)
Calcium: 8.2 mg/dL — ABNORMAL LOW (ref 8.9–10.3)
Chloride: 108 mmol/L (ref 98–111)
Creatinine, Ser: 0.73 mg/dL (ref 0.44–1.00)
GFR, Estimated: >60 mL/min (ref >60)
Glucose, Bld: 94 mg/dL (ref 70–99)
Potassium: 3.8 mmol/L (ref 3.5–5.1)
Sodium: 140 mmol/L (ref 135–145)
Total Bilirubin: 0.3 mg/dL (ref 0.0–1.2)
Total Protein: 6 g/dL — ABNORMAL LOW (ref 6.5–8.1)

## 2024-08-23 LAB — URINALYSIS, MICROSCOPIC (REFLEX)

## 2024-08-23 LAB — WET PREP, GENITAL
Sperm: NONE SEEN
Trich, Wet Prep: NONE SEEN
WBC, Wet Prep HPF POC: <10 (ref <10)

## 2024-08-23 LAB — URINALYSIS, ROUTINE W REFLEX MICROSCOPIC
Bilirubin Urine: NEGATIVE
Glucose, UA: 100 mg/dL — AB
Ketones, ur: NEGATIVE mg/dL
Leukocytes,Ua: NEGATIVE
Nitrite: NEGATIVE
Protein, ur: 30 mg/dL — AB
Specific Gravity, Urine: >1.03 — ABNORMAL HIGH (ref 1.005–1.030)
pH: 6 (ref 5.0–8.0)

## 2024-08-23 LAB — PREPARE RBC (CROSSMATCH)

## 2024-08-23 LAB — APTT: aPTT: 29 s (ref 24–36)

## 2024-08-23 MED ORDER — MEGESTROL ACETATE 40 MG PO TABS
40.0000 mg | ORAL_TABLET | Freq: Two times a day (BID) | ORAL | Status: DC
  Administered 2024-08-24 (×2): 40 mg via ORAL
  Filled 2024-08-23 (×3): qty 1

## 2024-08-23 MED ORDER — SODIUM CHLORIDE 0.9% IV SOLUTION: Freq: Once | INTRAVENOUS | Status: AC

## 2024-08-23 MED ORDER — FERROUS SULFATE 325 (65 FE) MG PO TABS: 325.0000 mg | ORAL_TABLET | Freq: Every day | ORAL | 0 refills | Status: DC

## 2024-08-23 MED ORDER — METRONIDAZOLE 500 MG PO TABS
500.0000 mg | ORAL_TABLET | Freq: Two times a day (BID) | ORAL | Status: DC
  Administered 2024-08-24 (×2): 500 mg via ORAL
  Filled 2024-08-23 (×2): qty 1

## 2024-08-23 NOTE — Discharge Instructions (Addendum)
 [employer] assistance programs Crisis assistance programs  -[facility] Homelessness Arts Development Officer. If you are experiencing homelessness in [place], [place] , your first point of contact should be Pensions Consultant. You can reach Coordinated Entry by calling ([phone]) [phone] or by emailing [email or web address] [email or web address].  Community access points: [employer] [phone] [address], [place]) every Tuesday from 9am-10am. [facility] ([address] [place]. [address] [place]) every Wednesday from 8am-9am.   - Coordinated Re-entry Daniel Mcalpine: Dial 211 and request. Offers referrals to homeless shelters in the area.    -The [employer] 365-705-2535) offers several services to local families, as funding allows. The Emergency Assistance Program (EAP), which they administer, provides household goods, free food, clothing, and financial aid to people in need in the [place]  area. The EAP program does have some qualification, and counselors will interview clients for financial assistance by written referral only. Referrals need to be made by the Department of Social Services or by other EAP approved human services agencies or charities in the area.  -Open Door Ministries of [employer], which can be reached at ([phone], offers emergency assistance programs for those in need of help, such as food, rent assistance, a soup kitchen, shelter, and clothing. They are based in [facility]  but provide a number of services to those that qualify for assistance.   [facility] Department of Social Services may be able to offer temporary financial assistance and cash grants for paying rent and utilities, Help may be provided for local county residents who may be experiencing personal crisis when other resources, including government programs, are not available. Call [phone]  -High [employer] is a [employer] agency, The organization can offer emergency assistance for paying rent, caremark rx, utilities, food, household products and furniture. They offer extensive emergency and transitional housing for families, children and single women, and also run a Boy's and Dole Food. Thrift Shops, Secondary School Teacher, and other aid offered too. [address], [place], [place]  [address], ([phone]  -[place] Low Income Energy Assistance Program -- This is offered for [facility] families. The federal government created [employer] Program provides a one-time cash grant payment to help eligible low-income families pay their electric and heating bills. [address] [place], [place]  [address], ([phone]  -[place] Emergency Assistance -- A program offers emergency utility and rent funds for greater [place] area residents. The program can also provide counseling and referrals to charities and government programs. Also provides food and a free meal program that serves lunch Mondays - Saturdays and dinner seven days per week to individuals in the community. [address] [place], [place]  [address], 6780456997  -[employer] - Offers affordable apartment and housing communities across      [place] and [place]. The low income and seniors can access public housing, rental assistance to qualified applicants, and apply for the section 8 rent subsidy program. Other programs include Chiropractor and Engineer, Maintenance. [address], [place], [place]  [address], dial [phone].  -The [facility] provides transitional housing to veterans and the disabled. Clients will also access other services too, including assistance in applying for Disability, life skills classes, case management, and assistance in finding permanent housing. [address] [place], [place]  [place] [address], call [phone]  -[place] Transitional Housing through [facility] is for people who were just  evicted or that are formerly homeless. The non-profit will also help then gain self-sufficiency, find a home or apartment to live in, and also provides information on rent assistance when needed. Phone ([phone]  -The [facility] helps low income, elderly, or disabled residents in seven counties in the [place] ([place], [place], [place], [place], [place], [place], [place], and [place]) save energy and reduce their utility bills by improving energy efficiency. Phone [phone].  -[employer] is located in the [place] [facility] in the [employer], [address], [place], [place] [address]. Parking is in the rear of the building. Phone: [phone]   General Email: info@gsohc .org  GHC provides free housing counseling assistance in locating affordable rental housing or housing with support services for families and individuals in crisis and the chronically homeless. We provide potential resources for other housing needs like utilities. Our trained counselors also work with clients on budgeting and financial literacy in effort to empower them to take control of their financial situations. [employer] collaborates with homeless service providers and other stakeholders as part of the [employer] COC (Continuum of Care). The (COC) is a regional/local planning body that coordinates housing and services funding for homeless families and individuals. The role of [facility] in the COC is through housing counseling to work with people we serve on diversion strategies for those that are at imminent risk of becoming homeless. We also work with the Coordinated Assessment/Entry Specialist who attempts to find temporary solutions and/or connects the people  to Housing First, Rapid Re-housing or transitional housing programs. Our Homelessness Prevention Housing Counselors meet with clients on business days (Monday-Fridays, except scheduled holidays) from 8:30 am to 4:30 pm.  Legal assistance for evictions, foreclosure, and more -If you need free legal advice on civil issues, such as foreclosures, evictions, electronics engineer, government programs, domestic issues and more, Landscape Architect of [place]  [facility]) is a associate professor firm that provides free legal services and counsel to lower income people, seniors, disabled, and others, The goal is to ensure everyone has access to justice and fair representation. Call them at [phone].  [facility] for Housing and Community Studies can provide info about obtaining legal assistance with evictions. Phone [phone].  Data Processing Manager  The [employer], [employer]. offers job and dispensing optician. Resources are focused on helping students obtain the skills and experiences that are necessary to compete in today's challenging and tight job market. The non-profit faith-based community action agency offers internship trainings as well as classroom instruction. Classes are tailored to meet the needs of people in the [facility] region. [place], [place] [address], [phone]  Foreclosure prevention/Debt Services Family Services of the [employer] Credit Counseling Service inludes debt and foreclosure prevention programs for local families. This includes money management, financial advice, budget review and development of a written action plan with a pensions consultant to help solve specific individual financial problems. In addition, housing and mortgage counselors can also provide pre- and post-purchase homeownership counseling, default resolution counseling (to prevent foreclosure) and reverse mortgage counseling. A Debt Management Program allows  people and families with a high level of credit card or medical debt to consolidate and repay consumer debt and loans to creditors and rebuild positive credit ratings and scores. Contact (336) D7650557.  Community clinics in [place] -Health Department [facility]: [address] [place], [place], [address]. [phone].  -Health Department [place] Clinic: [phone]  [address], [facility], [address]. [phone].  -[facility] offers medical care through a group of doctors, pharmacies and other healthcare related agencies that offer services for low income, uninsured adults in [place]. Also offers adult Dental care and assistance with applying for an [employer]. Call [phone].   [facility]. This center provides low-cost health care to those without health insurance. Services offered include an onsite pharmacy. Phone [phone]. [address], [place].  -Medication Assistance Program serves as a link between pharmaceutical companies and patients to provide low cost or free prescription medications. This service is available for residents who meet certain income restrictions and have no insurance coverage. PLEASE CALL 6414355205 [place]) OR ([phone] ([place])  -One Step Further: Materials Engineer, The [employer] Support & Nutrition Program, [place]. Call ([phone]/ [phone].  Food pantry and assistance -Urban Ministry-Food Bank: [address], [place] [address]. Phone [phone]  -Blessed Table Food Pantry: [address], [place], [place] [address]. [phone].  -Missionary Ministry: has the purpose of visiting the sick and shut-ins and provide for needs in the surrounding communities. Call [phone]. Email: [email or web address] .[email or web address] This program provides: Food box for seniors, Financial assistance, Food to meet basic  nutritional needs.  -Meals on Wheels with Senior Resources: [facility] residents age 75 and over who are homebound and unable to obtain and prepare a nutritious meal for themselves are eligible for this service. There may be a waiting list in certain parts of [place] if the route in that area is full. If you are in [facility] and [place] call [phone] to register. For all other areas call [phone] to register.  -Greater Dietitian: https://findfood.bargaincontractor.si  TRANSPORTATION: -[employer] Department of Health: Call [facility] and [employer] at [phone] for details. attractionguides.es  -Access [place]: Access [place] is the [employer]'s shared-ride transportation service for eligible riders who have a disability that prevents them from riding the fixed route bus. Call [phone]. Access [place] riders must pay a fare of $1.50 per trip, or may purchase a 10-ride punch card for $14.00 ($1.40 per ride) or a 40-ride punch card for $48.00 ($1.20 per ride).  -The Shepherd's WHEELS rideshare transportation service is provided for senior citizens (60+) who live independently within [place] city limits and are unable to drive or have limited access to transportation. Call [phone] to schedule an appointment.  -[place] Transportation: For Medicare or Medicaid recipients call [phone]?SABRA Ambulance, wheelchair fleeta, and ambulatory quotes available.   FLEEING VIOLENCE: -Family Services of the [place]- 24/7 Crisis line (978)[phone]) -[facility] Justice Centers: (336) 641-SAFE (714)[phone])  [place] 2-1-1 is another useful way to locate resources in the community. Visit shedsizes.ch to find service information online. If you need additional assistance, 2-1-1 Referral Specialists are available 24 hours a day, every day by dialing  2-1-1 or [phone] from any phone. The call is free, confidential, and available in any language.  Affordable Housing Search http://www.nchousingsearch.[facility] [facility])   M-F 8a-3p [address]  [place], [place] [address] [phone] Services include: laundry, barbering, support groups, case management, phone & computer access, showers, AA/NA mtgs, mental health/substance abuse nurse, job skills class, disability information, VA assistance, spiritual classes, etc. Winter Shelter available when temperatures are less than 32 degrees.   HOMELESS SHELTERS [facility] at [facility]- Call [phone] ext. [phone]  or ext. 336. Located at [address], [place], [place] [address]  [facility] Mens Shelter- Call [phone]. Located at [address], [place] [address].  [facility]- [employer]. Call [phone]. Office located at [address], [place] [address].  Pathways Family Housing through [place] ([phone].  [facility] Family Shelter- Call [phone]. Located at [address] [place], [place], [place] [address].  Room at the Inn-For Pregnant mothers. Call [phone]. Located at [address] [place], [address].  [place] [facility]-For men in [place]. Call [phone]. [address]-Shelter in [place]. Call [phone].  Home of [employer] for [employer] [phone]. Office located at [address] [place], [address].  [employer] be agreeable to help with chores. Call [phone] ext. [phone].  Men's: [address] [place], [place] [address]. Women's: [facility]  [address] [place], [place] [address]  Crisis Services Therapeutic Alternatives Mobile Crisis Management- [phone]  [facility] [address], [place], [place] [address]. Phone: [phone]    [facility]HE[facility] [employer] Address: [address] [place], [place] [address] Phone Number: [phone] Hours of Operation: Residents of [place] can come to obtain food Monday through Friday from 8:30am until 3:30pm. Photo ID and Social Security cards required for all residents of a household. Can come six times a year  THE BLESSED TABLE Address: [address] [place], [place] [address] Phone Number: [phone] Hours of Operation: Operates Tuesday-Friday 10:00 a.m. to 1 p.m. Requirements: Referral from DSS needed. May come 6 times a year, 30 days apart. Photo ID and SS required for all residents of household.  [facility] MIN[facility] Address: [address] [place], [place] [address] Phone Number: [phone] Hours of Operation: Food pantry is open on the last Saturday of each month from 10:00 am - 12:00 noon. No appointment needed. No qualifications.  [facility] Address: [address] [place], [place] [address] Phone Number: [phone] EXT. [phone] Hours of Operation: Must make reservations to pick up food on Saturdays. Sign ups for Saturday pick up beginning at 8:30 a.m. on Monday morning.  [facility] Address: [address] [place], [place] [address] Phone Number: [phone] Hours of Operation: If you need food, bring proper identification such as a driver's license to receive a bag of food once a month. Requirements: Can come once every 30 days with referral DSS, Holiday Representative, Mental health etc. Each referral good for six visits. Photo ID required. *1st visit no referral required.  [facility] Address: [address] [place], [place] [address] Phone Number: [phone]  [place] [facility] Address: [address] [place], [place] [address] Phone Number: [phone] Hours of Operation:  You can register at [email or web address] for free groceries  FREE INDEED FOOD PANTRY Address: [address] [place], [place] [address] Phone Number: [phone] Hours of Operation: Drive through giveaway, first come first served. Every 3rd Saturday 11AM - 1PM  [facility] [address] Address: [address] [place] [address] Phone Number: [phone]   [place]  HAND TO HAND FOOD PANTRY Address: [address] [facility] [address] [place], [place] [address] Phone Number: [phone] Hours of Operation: Once a month every 3rd Saturday  [facility] Address: [address] [place], [place] [address] Phone Number: [phone] Hours of Operation: Distribution happens from 9:00-10:00 a.m. every Saturday.     HELP[facility]S Address:  [address] [place], [place] [address] Phone Number: [phone] Hours of Operation: ONCE a week for the community food distribution held every Tuesday, Wednesday and Thursday from 11 a.m. - 2:00 p.m. Food is available on a first come, first serve basis and varies week to week. No appointment necessary for drive thru pick up.  [facility] Address: [address] [place], [place] [address] Phone Number: [phone] Hours of Operation: Open every 3rd Thursday 9:30 a.m. - 11:00 a.m.  [facility] Address: [address] [place], [place] [address] Phone Number: [phone] Hours of Operation: Please call for hours, directions, and questions  GREATER [place] FOOD ALLIANCE Address: [address] [place], [place]  [address] Phone Number: [phone] Website: https://www.hollyguns.co.za Food Finder app: https://findfood.ghpfa.org  [employer] Address: [address] [place], [place] [address] Phone Number: [phone] Hours of Operation: Contact Bree Harpe. Enrolled Substance Abuse Clients Only  [facility] Address: [address] [place], [address]  Phone Number: [phone] Hours of Operation: Contact Bartley Irving. Food pantry open the 3rd Saturday of each month from 9 a.m. -12 p.m.  only  [place] [facility] Address: [address] [place], [place] [address] Phone Number: [phone] Hours of Operation: Contact Geni Lee. Emergency food bank open on Saturdays by appointment only  [facility] Address: [address] [place], [place] [address] Phone Number: [phone] Hours of Operation: No specific contact person; Anyone can help  [facility] Address: [address] [place], [place] [address] Phone Number: [phone] Hours of Operation: Contact Medford Molt. Agency gives out a bag of food every Thursday from 2-4 p.m. only, and also provides a community meal every Thursday between 5-6 p.m. Other services provided include rent/mortgage and utility assistance, women's winter shelter, thrift store, and senior adult activities.  [facility] OF [place] Address: [address] [place], [place] [address] Phone Number: [phone] Hours of Operation: The Emergency Food Assistance Program provides individuals and families with a generous supply of food including meat, fresh vegetables, and nonperishable items. The food box contains five days' worth of food, and each family or individual can receive a box once per month. M, W, Th, Fr 11am-2pm, walk-ins welcome.  [facility] Address: [address] [place], [place] [address]  Phone Number: [phone] Hours of Operation: Contact Asia Cathlean. Tuesdays and Thursdays from 11am - 3pm by appointment only    Transportation Resources  -[facility] for an application. No fee for people over the age of 73. P: [phone]. Address: [address] [place], [place] [address]  -Senior Wheels Transportation-Age 24 and over. Limit of 1 ride per week for ambulatory participants. Limit 1 ride per month for non-ambulatory participants needing wheelchair transportation. Contact: [phone] ([place]), [phone] [facility]).  -Access [place]: Available for people with disabilities in [facility] Limits who are unable to use fixed-route bus service. Fee applies. Contact: [phone] (For application requests)/([phone] (Customer service)/([phone] (I-ride reservation line)/([phone] (Access [place] reservation line)

## 2024-08-23 NOTE — Consult Note (Signed)
   OB/GYN Telephone Consult  Megan Chaney is a 53 y.o. G0P0000 presenting with HVB in setting of fibroids. She was admitted for this in mid-September. She has a regular GYN through [facility] and does follow up with them (last seen 9/30).    I was called for a consult regarding the care of this patient by [facility] [place]. [facility].    The provider had a clinical question regarding recommendations.    The provider presented the following relevant clinical information and I performed a chart review on the patient and reviewed available documentation:  Patient has heavy bleeding but slow ooze now. HgB is 7.2.   I reviewed CareEverywhere:  - I reviewed the note from 9/30 with her current GYN. Plan is for IUD as that has worked for her in the past.   BP 115/76   Pulse (!) 107   Temp 98.1 F (36.7 C)   Resp 19   SpO2 100%   Exam- performed by consulting provider  Recommendations:  - Would resume Megace  in the short term. She has had the side effect of weight gain in the past and was put on Provera  5 mg by her prior GYN instead. However, given her hgb of 7.2 I think Megace  will work more efficiently and is a reasonable course for the short term until her appointment to have the IUD placed.  Will need to bridge the Megace  to the IUD insertion with some overlap. Will leave the discretion of her GYN.  - I also reviewed Dr. Sallee consult note from 9/14 and agree with her recommendations as well.  - Can consult us  if any additional questions.   Thank you for this consult and if additional recommendations are needed please call [phone] for the OB/GYN attending on service at [facility].   I spent approximately 5 minutes directly consulting with the provider and verbally discussing this case. Additionally 15 minutes minutes was spent performing chart review and documentation.   Vina Solian, MD Attending Obstetrician & Gynecologist, [facility] for [facility], [facility]

## 2024-08-23 NOTE — ED Provider Triage Note (Signed)
 Emergency Medicine Provider Triage Evaluation Note  Megan Chaney , a 53 y.o. female  was evaluated in triage.  Pt complains of 3 days of vaginal bleeding  With associated shortness of breath and lightheadedness.  Review of Systems  Positive: Vaginal bleeding, lightheadedness, shortness of breath Negative: Chest pain, syncope, diarrhea, constipation  Physical Exam  BP 139/84 (BP Location: Right Arm)   Pulse (!) 138   Temp 98.6 F (37 C)   Resp 17   SpO2 100%  Gen:   Awake, no distress   Resp:  Normal effort and lungs clear to auscultation MSK:   Moves extremities without difficulty  Other:    Medical Decision Making  Medically screening exam initiated at 4:08 PM.  Appropriate orders placed.  Megan Chaney was informed that the remainder of the evaluation will be completed by another provider, this initial triage assessment does not replace that evaluation, and the importance of remaining in the ED until their evaluation is complete.  Patient reports going through 27 pads in the last 3 days.  She had an IUD that was supposed to be replaced on the 27th but they were unable to locate the initial IUD.  OB/GYN believes it may have came out during the excessive bleeding but they were going to do further imaging to try to locate it.  Patient is not on any blood thinners.  She has had to have a blood transfusion in the past due to excessive vaginal bleeding.  Patient is in no acute distress in triage.   Megan Chaney, [place] 08/23/24 [id number]

## 2024-08-23 NOTE — ED Triage Notes (Signed)
 QUICK TRIAGE: Pt to ER with c/o heavy vaginal bleeding for last several days.

## 2024-08-23 NOTE — H&P (Signed)
 History and Physical    Megan Chaney FMW:[id number] DOB: June 23, 1971 DOA: 08/23/2024  Patient coming from: Home.  Chief Complaint: Vaginal bleeding.  HPI: Megan Chaney is a 53 y.o. female with with history of stroke and prior right upper extremity DVT in January 2023 with history of PFO presents to the ER with complaints of increasing vaginal bleeding last 48 hours.  Patient was admitted for similar complaints last month and was placed on Megace  and had followed up with patient's gynecologist.  Plan was to place patient on IUD and about 2 weeks ago patient's Megace  was discontinued given the prior history of stroke.  Last 2 days patient has been having severe vaginal bleed.  Was also getting short of breath fatigue.  ED Course: In the ER patient was tachycardic.  Hemoglobin is around 7.2 WBC 13.1 wet prep shows clue cells so started on Flagyl .  ER physician discussed with on-call OB/GYN Dr. Cleatus who advised patient to be started on Megace  and follow-up with patient's primary gynecologist with [facility] soon for her IUD placement.  Patient was placed on 1 unit of PRBC transfusion but admitted for further observation.  Review of Systems: As per HPI, rest all negative.   Past Medical History:  Diagnosis Date   Acute deep vein thrombosis (DVT) of right upper extremity (HCC) 11/03/2021   Anemia    Fibroid    GERD (gastroesophageal reflux disease)     Past Surgical History:  Procedure Laterality Date   BUBBLE STUDY  10/30/2021   Procedure: BUBBLE STUDY;  Surgeon: Delford Maude BROCKS, MD;  Location: [facility] ENDOSCOPY;  Service: Cardiovascular;;   LOOP RECORDER INSERTION N/A 10/30/2021   Procedure: LOOP RECORDER INSERTION;  Surgeon: Cindie Ole DASEN, MD;  Location: [facility] INVASIVE CV LAB;  Service: Cardiovascular;  Laterality: N/A;   MULTIPLE TOOTH EXTRACTIONS     NO PAST SURGERIES     TEE WITHOUT CARDIOVERSION N/A 10/30/2021   Procedure: TRANSESOPHAGEAL ECHOCARDIOGRAM (TEE);  Surgeon: Delford Maude BROCKS, MD;  Location: [facility] ENDOSCOPY;  Service: Cardiovascular;  Laterality: N/A;     reports that she quit smoking about 25 years ago. Her smoking use included cigarettes. She has never used smokeless tobacco. She reports current alcohol use. She reports that she does not currently use drugs after having used the following drugs: Marijuana.  Allergies  Allergen Reactions   Latex Hives and Rash   Other Hives and Rash    Powder in latex gloves    Family History  Problem Relation Age of Onset   Chronic Renal Failure Father    Miscarriages / Stillbirths Father    Heart attack Mother    Stroke Mother    Cancer Mother    Diabetes Mother     Prior to Admission medications   Medication Sig Start Date End Date Taking? Authorizing Provider  acetaminophen  (TYLENOL ) 325 MG tablet Take 2 tablets (650 mg total) by mouth every 4 (four) hours as needed for mild pain or headache (or temp > 37.5 C (99.5 F)). 11/03/21   Dickie Begun, MD  amitriptyline (ELAVIL) 10 MG tablet Take 10 mg by mouth at bedtime.    [provider]  aspirin  EC 81 MG tablet Take 81 mg by mouth daily. Swallow whole.    [provider]  atorvastatin  (LIPITOR) 40 MG tablet Take 1 tablet (40 mg total) by mouth daily. 11/03/21   Dickie Begun, MD  atorvastatin  (LIPITOR) 80 MG tablet Take 80 mg by mouth daily.    [provider]  baclofen (LIORESAL) 10 MG tablet Take 10 mg by mouth every 6 (six) hours as needed for muscle spasms.    [provider]  calcium -vitamin D  (OSCAL 500/200 D-3) 500-200 MG-UNIT tablet Take 1 tablet by mouth 2 (two) times daily. 10/05/19   Fredirick Glenys RAMAN, MD  Cholecalciferol  (VITAMIN D3) 1.25 MG (50000 UT) CAPS Take 50,000 Units by mouth once a week. Patient not taking: Reported on 07/08/2024 07/20/21   [provider]  ferrous sulfate  325 (65 FE) MG tablet Take 1 tablet (325 mg total) by mouth daily with breakfast. 08/23/24 09/22/24  Sharlet Dowdy, MD  FLUoxetine   (PROZAC ) 40 MG capsule Take 40 mg by mouth daily.    [provider]  levonorgestrel (MIRENA) 20 MCG/DAY IUD 1 Application by Intrauterine route once. 05/06/22   [provider]  metoprolol  succinate (TOPROL -XL) 25 MG 24 hr tablet Take 0.5 tablets (12.5 mg total) by mouth daily. 11/03/21   Dickie Begun, MD  pregabalin  (LYRICA ) 100 MG capsule Take 100 mg by mouth 2 (two) times daily.    [provider]  senna-docusate (SENOKOT-S) 8.6-50 MG tablet Take 1 tablet by mouth at bedtime. Patient taking differently: Take 1 tablet by mouth at bedtime as needed for mild constipation or moderate constipation. 11/03/21   Dickie Begun, MD    Physical Exam: Constitutional: Moderately built and nourished. Vitals:   08/23/24 2245 08/23/24 2300 08/23/24 2315 08/23/24 2330  BP: 112/76 108/73 116/81 115/76  Pulse: (!) 111 (!) 109 (!) 107 (!) 107  Resp: 20 20 (!) 21 19  Temp: 98.4 F (36.9 C)   98.1 F (36.7 C)  TempSrc: Oral     SpO2: 100% 100% 100% 100%   Eyes: Anicteric no pallor. ENMT: No discharge from the ears eyes nose or mouth. Neck: No mass felt.  No neck rigidity. Respiratory: No rhonchi or crepitations. Cardiovascular: S1-S2 heard. Abdomen: Soft nontender bowel sound present. Musculoskeletal: No edema. Skin: No rash. Neurologic: Alert awake oriented to time place and person.  Moves all extremities. Psychiatric: Appears normal.  Normal affect.   Labs on Admission: I have personally reviewed following labs and imaging studies  CBC: Recent Labs  Lab 08/23/24 1540  WBC 13.1*  NEUTROABS 11.0*  HGB 7.2*  HCT 23.4*  MCV 95.1  PLT 227   Basic Metabolic Panel: Recent Labs  Lab 08/23/24 1540  NA 140  K 3.8  CL 108  CO2 22  GLUCOSE 94  BUN 13  CREATININE 0.73  CALCIUM  8.2*   GFR: CrCl cannot be calculated (Unknown ideal weight.). Liver Function Tests: Recent Labs  Lab 08/23/24 1540  AST 21  ALT 16  ALKPHOS 55  BILITOT 0.3  PROT 6.0*  ALBUMIN  3.4*   No results for input(s): LIPASE, AMYLASE in the last 168 hours. No results for input(s): AMMONIA in the last 168 hours. Coagulation Profile: Recent Labs  Lab 08/23/24 1609  INR 1.0   Cardiac Enzymes: No results for input(s): CKTOTAL, CKMB, CKMBINDEX, TROPONINI in the last 168 hours. BNP (last 3 results) No results for input(s): PROBNP in the last 8760 hours. HbA1C: No results for input(s): HGBA1C in the last 72 hours. CBG: No results for input(s): GLUCAP in the last 168 hours. Lipid Profile: No results for input(s): CHOL, HDL, LDLCALC, TRIG, CHOLHDL, LDLDIRECT in the last 72 hours. Thyroid Function Tests: No results for input(s): TSH, T4TOTAL, FREET4, T3FREE, THYROIDAB in the last 72 hours. Anemia Panel: No results for input(s): VITAMINB12, FOLATE, FERRITIN, TIBC,  IRON , RETICCTPCT in the last 72 hours. Urine analysis:    Component Value Date/Time   COLORURINE YELLOW 08/23/2024 1540   APPEARANCEUR CLOUDY (A) 08/23/2024 1540   LABSPEC >1.030 (H) 08/23/2024 1540   PHURINE 6.0 08/23/2024 1540   GLUCOSEU 100 (A) 08/23/2024 1540   HGBUR LARGE (A) 08/23/2024 1540   BILIRUBINUR NEGATIVE 08/23/2024 1540   KETONESUR NEGATIVE 08/23/2024 1540   PROTEINUR 30 (A) 08/23/2024 1540   UROBILINOGEN 0.2 01/27/2013 1542   NITRITE NEGATIVE 08/23/2024 1540   LEUKOCYTESUR NEGATIVE 08/23/2024 1540   Sepsis Labs: @LABRCNTIP (procalcitonin:4,lacticidven:4) ) Recent Results (from the past 240 hours)  Wet prep, genital     Status: Abnormal   Collection Time: 08/23/24  8:20 PM   Specimen: PATH Cytology Cervicovaginal Ancillary Only  Result Value Ref Range Status   Yeast Wet Prep HPF POC (A) NONE SEEN Final    Swab received with less than 0.5 mL of saline, saline added to specimen, interpret results with caution.   Trich, Wet Prep NONE SEEN NONE SEEN Final   Clue Cells Wet Prep HPF POC PRESENT (A) NONE SEEN Final   WBC, Wet Prep HPF POC  <10 <10 Final   Sperm NONE SEEN  Final    Comment: Performed at [facility], [address], [place], [place] [address]     Radiological Exams on Admission: DG Abdomen 1 View Result Date: 08/23/2024 CLINICAL DATA:  Evaluate for IUD location. EXAM: ABDOMEN - 1 VIEW COMPARISON:  None Available. FINDINGS: No intrauterine device identified. There is moderate stool throughout the colon. No bowel dilatation or evidence of obstruction. No free air. The osseous structures are intact. The soft tissues unremarkable. IMPRESSION: 1. No intrauterine device identified. 2. Moderate colonic stool burden. Electronically Signed   By: Vanetta Chou M.D.   On: 08/23/2024 19:25    EKG: Independently reviewed.  Sinus tachycardia.  Assessment/Plan Active Problems:   DUB (dysfunctional uterine bleeding)   Symptomatic anemia   History of CVA (cerebrovascular accident)   Patent foramen ovale    Symptomatic anemia secondary to menorrhagia 1 unit of PRBC transfusion has been ordered.  Recheck CBC. Menorrhagia appreciate Dr. Cleatus OB/GYN recommendation patient started on Megace  and to bridge until IUD placed by patient's primary GYN at [facility]. Bacterial vaginosis on Flagyl . History of stroke with history of PFO takes statins.  Patient did not take her aspirin  last 2 days due to menorrhagia. History of hypertension on metoprolol . Prior history of right upper extremity DVT was on anticoagulation for 3 months during the time when she had stroke in January 2023. History of depression on fluoxetine . Chronic back pain on baclofen and Lyrica .  Dose confirmed on PDMP website.  Since patient has symptomatic anemia will need close monitoring of further workup and more than 2 midnight stay.   DVT prophylaxis: SCDs. Code Status: Full code. Family Communication: Discussed with patient. Disposition Plan: Medical floor. Consults called: OB/GYN. Admission status: Observation.

## 2024-08-23 NOTE — ED Notes (Signed)
 Pt got up to get dressed and her HR increased to 140s. Pt stated she felt dizzy and exhausted while getting dressed. MD Lowther notified

## 2024-08-23 NOTE — ED Provider Notes (Signed)
 [place] EMERGENCY DEPARTMENT AT [facility] Provider Note   CSN: [id number] Arrival date & time: 08/23/24  1525     Patient presents with: Vaginal Bleeding   Megan Chaney is a 53 y.o. female with past medical history of dysfunctional uterine bleeding, fibroids, and recent admission for blood loss anemia presenting to the emergency department for recurrence of vaginal bleeding.  Patient reports she began with heavy vaginal bleeding again 3 days ago.  She has been going through a extra-large pad about every hour.  She reports she has had recurrence of dizziness and shortness of breath.  Patient was seen here recently for similar symptoms in the setting of vaginal bleeding, she  had a hemoglobin of 4.9, had 2 blood transfusions, and was admitted.  Since then she has been receiving her transfusions.  She followed up with her OB/GYN who was concerned her IUD had been expelled in the setting of heavy bleeding.  She denies syncope but endorses palpitations, fatigue, shortness of breath, and lightheadedness.    Vaginal Bleeding Associated symptoms: fatigue        Prior to Admission medications   Medication Sig Start Date End Date Taking? Authorizing Provider  acetaminophen  (TYLENOL ) 325 MG tablet Take 2 tablets (650 mg total) by mouth every 4 (four) hours as needed for mild pain or headache (or temp > 37.5 C (99.5 F)). 11/03/21   Dickie Begun, MD  amitriptyline (ELAVIL) 10 MG tablet Take 10 mg by mouth at bedtime.    [provider]  aspirin  EC 81 MG tablet Take 81 mg by mouth daily. Swallow whole.    [provider]  atorvastatin  (LIPITOR) 40 MG tablet Take 1 tablet (40 mg total) by mouth daily. 11/03/21   Dickie Begun, MD  atorvastatin  (LIPITOR) 80 MG tablet Take 80 mg by mouth daily.    [provider]  baclofen (LIORESAL) 10 MG tablet Take 10 mg by mouth every 6 (six) hours as needed for muscle spasms.    [provider]  calcium -vitamin D   (OSCAL 500/200 D-3) 500-200 MG-UNIT tablet Take 1 tablet by mouth 2 (two) times daily. 10/05/19   Fredirick Glenys RAMAN, MD  Cholecalciferol  (VITAMIN D3) 1.25 MG (50000 UT) CAPS Take 50,000 Units by mouth once a week. Patient not taking: Reported on 07/08/2024 07/20/21   [provider]  ferrous sulfate  325 (65 FE) MG tablet Take 1 tablet (325 mg total) by mouth daily with breakfast. 08/23/24 09/22/24  Sharlet Dowdy, MD  FLUoxetine  (PROZAC ) 40 MG capsule Take 40 mg by mouth daily.    [provider]  levonorgestrel (MIRENA) 20 MCG/DAY IUD 1 Application by Intrauterine route once. 05/06/22   [provider]  metoprolol  succinate (TOPROL -XL) 25 MG 24 hr tablet Take 0.5 tablets (12.5 mg total) by mouth daily. 11/03/21   Dickie Begun, MD  pregabalin  (LYRICA ) 100 MG capsule Take 100 mg by mouth 2 (two) times daily.    [provider]  senna-docusate (SENOKOT-S) 8.6-50 MG tablet Take 1 tablet by mouth at bedtime. Patient taking differently: Take 1 tablet by mouth at bedtime as needed for mild constipation or moderate constipation. 11/03/21   Dickie Begun, MD    Allergies: Latex and Other    Review of Systems  Constitutional:  Positive for fatigue.  Respiratory:  Positive for shortness of breath.   Cardiovascular:  Positive for palpitations.  Genitourinary:  Positive for vaginal bleeding.    Updated Vital Signs BP 115/76   Pulse (!) 107  Temp 98.1 F (36.7 C)   Resp 19   SpO2 100%   Physical Exam Vitals and nursing note reviewed.  Constitutional:      General: She is not in acute distress.    Appearance: She is well-developed.  HENT:     Head: Normocephalic and atraumatic.  Eyes:     Conjunctiva/sclera: Conjunctivae normal.  Cardiovascular:     Rate and Rhythm: Regular rhythm. Tachycardia present.     Heart sounds: No murmur heard. Pulmonary:     Effort: Pulmonary effort is normal. No respiratory distress.     Breath sounds: Normal breath sounds.   Abdominal:     Palpations: Abdomen is soft.     Tenderness: There is no abdominal tenderness.  Genitourinary:    Vagina: Normal.     Cervix: Cervical bleeding present.  Musculoskeletal:        General: No swelling.     Cervical back: Neck supple.  Skin:    General: Skin is warm and dry.     Capillary Refill: Capillary refill takes less than 2 seconds.  Neurological:     Mental Status: She is alert.  Psychiatric:        Mood and Affect: Mood normal.     (all labs ordered are listed, but only abnormal results are displayed) Labs Reviewed  WET PREP, GENITAL - Abnormal; Notable for the following components:      Result Value   Yeast Wet Prep HPF POC   (*)    Value: Swab received with less than 0.5 mL of saline, saline added to specimen, interpret results with caution.   Clue Cells Wet Prep HPF POC PRESENT (*)    All other components within normal limits  CBC WITH DIFFERENTIAL/PLATELET - Abnormal; Notable for the following components:   WBC 13.1 (*)    RBC 2.46 (*)    Hemoglobin 7.2 (*)    HCT 23.4 (*)    RDW 19.1 (*)    Neutro Abs 11.0 (*)    All other components within normal limits  URINALYSIS, ROUTINE W REFLEX MICROSCOPIC - Abnormal; Notable for the following components:   APPearance CLOUDY (*)    Specific Gravity, Urine >1.030 (*)    Glucose, UA 100 (*)    Hgb urine dipstick LARGE (*)    Protein, ur 30 (*)    All other components within normal limits  COMPREHENSIVE METABOLIC PANEL WITH GFR - Abnormal; Notable for the following components:   Calcium  8.2 (*)    Total Protein 6.0 (*)    Albumin 3.4 (*)    All other components within normal limits  URINALYSIS, MICROSCOPIC (REFLEX) - Abnormal; Notable for the following components:   Bacteria, UA FEW (*)    All other components within normal limits  PROTIME-INR  APTT  POC URINE PREG, ED  TYPE AND SCREEN  PREPARE RBC (CROSSMATCH)  GC/CHLAMYDIA PROBE AMP ([place]) NOT AT [facility]    EKG: EKG  Interpretation Date/Time:  Thursday August 23 2024 16:45:04 EDT Ventricular Rate:  121 PR Interval:  132 QRS Duration:  82 QT Interval:  316 QTC Calculation: 448 R Axis:   73  Text Interpretation: Sinus tachycardia Otherwise normal ECG When compared with ECG of 08-Jul-2024 11:54, No significant change was found Confirmed by Raford Lenis ([id number]) on 08/23/2024 11:28:54 PM  Radiology: [facility] Abdomen 1 View Result Date: 08/23/2024 CLINICAL DATA:  Evaluate for IUD location. EXAM: ABDOMEN - 1 VIEW COMPARISON:  None Available. FINDINGS: No intrauterine device identified. There  is moderate stool throughout the colon. No bowel dilatation or evidence of obstruction. No free air. The osseous structures are intact. The soft tissues unremarkable. IMPRESSION: 1. No intrauterine device identified. 2. Moderate colonic stool burden. Electronically Signed   By: Vanetta Chou M.D.   On: 08/23/2024 19:25     Procedures   Medications Ordered in the ED  0.9 %  sodium chloride  infusion (Manually program via Guardrails IV Fluids) (has no administration in time range)  metroNIDAZOLE  (FLAGYL ) tablet 500 mg (has no administration in time range)  megestrol  (MEGACE ) tablet 40 mg (has no administration in time range)                                    Medical Decision Making Patient is a 53 year old female with history of fibroids and dysfunctional uterine bleeding presenting for recurrence of uterine bleeding.  Patient has known anemia and recently was admitted for blood transfusion.  Patient reports a past medical history of stroke which is why she is started on IUD rather than hormonal based treatments  Differential diagnosis includes not limited to symptomatic anemia, loss anemia, fibroids, leiomyoma, translocation of IUD  Patient has been following with hematology for iron  transfusions and OB/GYN for her dysfunctional uterine bleeding.  Per most recent visit, they are concerned that her IUD had been  expelled in the setting of heavy bleeding and was not visualized on transvaginal ultrasound.  There are plans for outpatient x-ray to ensure her IUD was not located elsewhere in the abdomen.  EKG revealed sinus tachycardia, no evidence of acute ischemia  Labs significant for leukocytosis and anemia with hemoglobin of 7.2 down from 8.2, 64-month previous  Abdominal x-ray without evidence of IUD in the abdomen  Pelvic exam significant for large clots within the vaginal vault and blood oozing from the cervical os. GC/chlamydia swabs were collected.  Wet prep with clue cells consistent with bacterial vaginosis, patient be treated with metronidazole .  Patient was persistently tachycardic.  When she stood up to dress she endorsed significant lightheadedness and became tachycardic to 140s.  Because of her symptomatic anemia with significant drop in hemoglobin, she was given 1 unit of PRBC transfusion.  OB/GYN was consulted who recommended Megace  40 mg twice daily for bleeding control in the acute setting ultimately with outpatient OB follow-up for possible for hysterectomy  Patient requires admission for symptomatic anemia.  I spoke with hospitalist who agreed to admit with OB/GYN recommendations.    Amount and/or Complexity of Data Reviewed External Data Reviewed: labs, radiology and notes. Labs: ordered. Radiology: ordered and independent interpretation performed. ECG/medicine tests: ordered and independent interpretation performed.  Risk OTC drugs. Prescription drug management. Decision regarding hospitalization.        Final diagnoses:  Vaginal bleeding  Abnormal vaginal bleeding  Symptomatic anemia  Bacterial vaginosis    ED Discharge Orders          Ordered    ferrous sulfate  325 (65 FE) MG tablet  Daily with breakfast        08/23/24 2038               Sharlet Dowdy, MD 08/23/24 2352    Lowther, Amy, DO 08/24/24 1619

## 2024-08-23 NOTE — ED Triage Notes (Signed)
 In addition to initial triage note. Pt is tachycardic during triage. Pt reports associated sob and dizziness from the vaginal bleeding reports she went through 27 pads in the past 3 days. Pt is AxOx4.

## 2024-08-24 ENCOUNTER — Other Ambulatory Visit (HOSPITAL_COMMUNITY): Payer: Self-pay | Admitting: Internal Medicine

## 2024-08-24 ENCOUNTER — Telehealth (HOSPITAL_COMMUNITY): Payer: Self-pay

## 2024-08-24 DIAGNOSIS — N920 Excessive and frequent menstruation with regular cycle: Secondary | ICD-10-CM | POA: Insufficient documentation

## 2024-08-24 DIAGNOSIS — D649 Anemia, unspecified: Secondary | ICD-10-CM | POA: Diagnosis not present

## 2024-08-24 DIAGNOSIS — D500 Iron deficiency anemia secondary to blood loss (chronic): Secondary | ICD-10-CM | POA: Diagnosis not present

## 2024-08-24 DIAGNOSIS — N939 Abnormal uterine and vaginal bleeding, unspecified: Secondary | ICD-10-CM | POA: Insufficient documentation

## 2024-08-24 LAB — CBC
HCT: 24.9 % — ABNORMAL LOW (ref 36.0–46.0)
Hemoglobin: 8.2 g/dL — ABNORMAL LOW (ref 12.0–15.0)
MCH: 30.3 pg (ref 26.0–34.0)
MCHC: 32.9 g/dL (ref 30.0–36.0)
MCV: 91.9 fL (ref 80.0–100.0)
Platelets: 184 K/uL (ref 150–400)
RBC: 2.71 MIL/uL — ABNORMAL LOW (ref 3.87–5.11)
RDW: 17.7 % — ABNORMAL HIGH (ref 11.5–15.5)
WBC: 10.1 K/uL (ref 4.0–10.5)
nRBC: 0.2 % (ref 0.0–0.2)

## 2024-08-24 LAB — TYPE AND SCREEN
ABO/RH(D): O POS
Antibody Screen: NEGATIVE
Unit division: 0

## 2024-08-24 LAB — GC/CHLAMYDIA PROBE AMP (~~LOC~~) NOT AT ARMC
Chlamydia: NEGATIVE
Comment: NEGATIVE
Comment: NORMAL
Neisseria Gonorrhea: NEGATIVE

## 2024-08-24 LAB — BPAM RBC
Blood Product Expiration Date: 202511242359
ISSUE DATE / TIME: 202510302212
Unit Type and Rh: 5100

## 2024-08-24 MED ORDER — FERROUS SULFATE 325 (65 FE) MG PO TABS: 325.0000 mg | ORAL_TABLET | ORAL | Status: AC

## 2024-08-24 MED ORDER — METRONIDAZOLE 500 MG PO TABS: 500.0000 mg | ORAL_TABLET | Freq: Two times a day (BID) | ORAL | 0 refills | Status: AC

## 2024-08-24 MED ORDER — ATORVASTATIN CALCIUM 80 MG PO TABS
80.0000 mg | ORAL_TABLET | Freq: Every day | ORAL | Status: DC
  Administered 2024-08-24: 80 mg via ORAL
  Filled 2024-08-24: qty 1

## 2024-08-24 MED ORDER — SODIUM CHLORIDE 0.9 % IV SOLN
200.0000 mg | Freq: Once | INTRAVENOUS | Status: AC
  Administered 2024-08-24: 200 mg via INTRAVENOUS
  Filled 2024-08-24: qty 10

## 2024-08-24 MED ORDER — OYSTER SHELL CALCIUM/D3 500-5 MG-MCG PO TABS
1.0000 | ORAL_TABLET | Freq: Two times a day (BID) | ORAL | Status: DC
  Administered 2024-08-24: 1 via ORAL
  Filled 2024-08-24 (×2): qty 1

## 2024-08-24 MED ORDER — ACETAMINOPHEN 325 MG PO TABS: 650.0000 mg | ORAL_TABLET | Freq: Four times a day (QID) | ORAL | Status: DC | PRN

## 2024-08-24 MED ORDER — METOPROLOL SUCCINATE ER 25 MG PO TB24
12.5000 mg | ORAL_TABLET | Freq: Every day | ORAL | Status: DC
  Administered 2024-08-24: 12.5 mg via ORAL
  Filled 2024-08-24: qty 1

## 2024-08-24 MED ORDER — ACETAMINOPHEN 650 MG RE SUPP: 650.0000 mg | Freq: Four times a day (QID) | RECTAL | Status: DC | PRN

## 2024-08-24 MED ORDER — ENSURE PLUS HIGH PROTEIN PO LIQD
237.0000 mL | Freq: Two times a day (BID) | ORAL | Status: DC
  Administered 2024-08-24: 237 mL via ORAL

## 2024-08-24 MED ORDER — BACLOFEN 10 MG PO TABS: 10.0000 mg | ORAL_TABLET | Freq: Four times a day (QID) | ORAL | Status: DC | PRN

## 2024-08-24 MED ORDER — PREGABALIN 100 MG PO CAPS
100.0000 mg | ORAL_CAPSULE | Freq: Two times a day (BID) | ORAL | Status: DC
  Administered 2024-08-24 (×2): 100 mg via ORAL
  Filled 2024-08-24 (×2): qty 1

## 2024-08-24 MED ORDER — FLUOXETINE HCL 20 MG PO CAPS
40.0000 mg | ORAL_CAPSULE | Freq: Every day | ORAL | Status: DC
  Administered 2024-08-24: 40 mg via ORAL
  Filled 2024-08-24: qty 2

## 2024-08-24 MED ORDER — MEGESTROL ACETATE 40 MG PO TABS: 40.0000 mg | ORAL_TABLET | Freq: Two times a day (BID) | ORAL | 0 refills | Status: AC

## 2024-08-24 NOTE — ED Notes (Signed)
Pharmacy at bedside

## 2024-08-24 NOTE — Discharge Summary (Signed)
 Physician Discharge Summary   Patient: Megan Chaney MRN: [id number] DOB: 1971-03-24  Admit date:     08/23/2024  Discharge date: 08/24/24  Discharge Physician: Carliss LELON Canales   PCP: Stephane Leita DEL, MD   Recommendations at discharge:    Pt to be discharged home.   If you experience worsening fever, chills, chest pain, shortness of breath, or other concerning symptoms, please call your PCP or go to the emergency department immediately.  Discharge Diagnoses: Principal Problem:   Symptomatic anemia Active Problems:   History of CVA (cerebrovascular accident)   Patent foramen ovale   Menorrhagia  Resolved Problems:   * No resolved hospital problems. *   Hospital Course:  53 y.o. female with with history of stroke and prior right upper extremity DVT in January 2023 with history of PFO presents to the ER with complaints of increasing vaginal bleeding last 48 hours.  Patient was admitted for similar complaints last month and was placed on Megace  and had followed up with patient's gynecologist.  Plan was to place patient on IUD and about 2 weeks ago patient's Megace  was discontinued given the prior history of stroke.  Last 2 days patient has been having severe vaginal bleed.  Was also getting short of breath fatigue.   Assessment and Plan:  Acute iron  deficiency anemia secondary to acute on chronic blood loss - Symptomatic on presentation.  S/p 1 unit PRBCs with improvement in symptoms.  Hemoglobin 8.2 this morning.  Will give 1 dose IV Venofer  200 mg as well.  Patient to resume her p.o. iron  supplementation every other day.  Chronic menorrhagia - Evaluated by gynecology.  Recommendations to initiate Megace  and to bridge until IUD can be placed by patient's primary gynecologist at [facility].  Prescription for Megace  provided as well.  Bacterial vaginosis - Clue cells noted on wet prep.  Initiated on Flagyl .  Prescription for Flagyl  provided at discharge.  History of CVA -  History of PFO, stents on board.  Holding aspirin  secondary to her anemia and severe menorrhagia.  Can discuss restarting aspirin  upon follow-up with gynecology.  History of RUE DVT - Completed anticoagulation in 2023.  Chronic back pain - Resume home baclofen, Lyrica .  Consultants: Gynecology Procedures performed: None Disposition: Home Diet recommendation:  Discharge Diet Orders (From admission, onward)     Start     Ordered   08/24/24 0000  Diet - low sodium heart healthy        08/24/24 1109           Cardiac diet  DISCHARGE MEDICATION: Allergies as of 08/24/2024       Reactions   Latex Hives, Rash   Other Hives, Rash   Powder in latex gloves        Medication List     STOP taking these medications    aspirin  EC 81 MG tablet       TAKE these medications    acetaminophen  500 MG tablet Commonly known as: TYLENOL  Take 1,000 mg by mouth every 6 (six) hours as needed for mild pain (pain score 1-3) or moderate pain (pain score 4-6).   amitriptyline 10 MG tablet Commonly known as: ELAVIL Take 10 mg by mouth at bedtime.   atorvastatin  80 MG tablet Commonly known as: LIPITOR Take 80 mg by mouth at bedtime.   baclofen 10 MG tablet Commonly known as: LIORESAL Take 10 mg by mouth every 6 (six) hours as needed for muscle spasms.   ferrous sulfate  325 (65  FE) MG tablet Take 1 tablet (325 mg total) by mouth every other day.   FLUoxetine  40 MG capsule Commonly known as: PROZAC  Take 40 mg by mouth at bedtime.   megestrol  40 MG tablet Commonly known as: MEGACE  Take 1 tablet (40 mg total) by mouth 2 (two) times daily for 14 days.   metoprolol  succinate 25 MG 24 hr tablet Commonly known as: TOPROL -XL Take 0.5 tablets (12.5 mg total) by mouth daily. What changed:  how much to take additional instructions   metroNIDAZOLE  500 MG tablet Commonly known as: FLAGYL  Take 1 tablet (500 mg total) by mouth 2 (two) times daily for 7 days.   Oscal 500/200 D-3  500-200 MG-UNIT tablet Generic drug: calcium -vitamin D  Take 1 tablet by mouth 2 (two) times daily.   pregabalin  100 MG capsule Commonly known as: LYRICA  Take 100 mg by mouth 2 (two) times daily.        Follow-up Information     Verna Larraine Helling, DO. Schedule an appointment as soon as possible for a visit in 3 days.   Specialties: Obstetrics, Obstetrics and Gynecology Contact information: [address] [place] [address] [phone]         Call  Milissa Motto, MD.   Specialty: Hematology and Oncology Contact information: [address] [place] [address] [place] [address] [phone]                 Discharge Exam: Fredricka Weights   08/24/24 0408  Weight: 64.6 kg    GENERAL:  Alert, pleasant, no acute distress  HEENT:  EOMI CARDIOVASCULAR:  RRR, no murmurs appreciated RESPIRATORY:  Clear to auscultation, no wheezing, rales, or rhonchi GASTROINTESTINAL:  Soft, nontender, nondistended EXTREMITIES:  No LE edema bilaterally NEURO:  No new focal deficits appreciated SKIN:  No rashes noted PSYCH:  Appropriate mood and affect     Condition at discharge: improving  The results of significant diagnostics from this hospitalization (including imaging, microbiology, ancillary and laboratory) are listed below for reference.   Imaging Studies: DG Abdomen 1 View Result Date: 08/23/2024 CLINICAL DATA:  Evaluate for IUD location. EXAM: ABDOMEN - 1 VIEW COMPARISON:  None Available. FINDINGS: No intrauterine device identified. There is moderate stool throughout the colon. No bowel dilatation or evidence of obstruction. No free air. The osseous structures are intact. The soft tissues unremarkable. IMPRESSION: 1. No intrauterine device identified. 2. Moderate colonic stool burden. Electronically Signed   By: Vanetta Chou M.D.   On: 08/23/2024 19:25   CUP PACEART REMOTE DEVICE CHECK Result Date: 08/09/2024 ILR summary report received. Battery status  OK. Normal device function. No new symptom, tachy, brady, or pause episodes. No new AF episodes. Monthly summary reports and ROV/PRN 3 false pause events c/w undersensing LA, CVRS   Microbiology: Results for orders placed or performed during the hospital encounter of 08/23/24  Wet prep, genital     Status: Abnormal   Collection Time: 08/23/24  8:20 PM   Specimen: PATH Cytology Cervicovaginal Ancillary Only  Result Value Ref Range Status   Yeast Wet Prep HPF POC (A) NONE SEEN Final    Swab received with less than 0.5 mL of saline, saline added to specimen, interpret results with caution.   Trich, Wet Prep NONE SEEN NONE SEEN Final   Clue Cells Wet Prep HPF POC PRESENT (A) NONE SEEN Final   WBC, Wet Prep HPF POC <10 <10 Final   Sperm NONE SEEN  Final    Comment: Performed  at [facility], [address], [place], [place] [address]    Labs: CBC: Recent Labs  Lab 08/23/24 1540 08/24/24 0219  WBC 13.1* 10.1  NEUTROABS 11.0*  --   HGB 7.2* 8.2*  HCT 23.4* 24.9*  MCV 95.1 91.9  PLT 227 184   Basic Metabolic Panel: Recent Labs  Lab 08/23/24 1540  NA 140  K 3.8  CL 108  CO2 22  GLUCOSE 94  BUN 13  CREATININE 0.73  CALCIUM  8.2*   Liver Function Tests: Recent Labs  Lab 08/23/24 1540  AST 21  ALT 16  ALKPHOS 55  BILITOT 0.3  PROT 6.0*  ALBUMIN 3.4*   CBG: No results for input(s): GLUCAP in the last 168 hours.  Discharge time spent: 35 minutes.  Length of inpatient stay: 0 days  Signed: Carliss LELON Canales, DO [facility] 08/24/2024

## 2024-08-24 NOTE — Progress Notes (Signed)
 NEW ADMISSION NOTE   Arrival Method: ED stretcher Mental Orientation: AAOx4 Telemetry: 5M14 Assessment: Completed Skin: See flowsheet IV: R wrist & LAC Pain: 0/10 Tubes: n/a Safety Measures: Safety Fall Prevention Plan has been given, discussed and signed Admission: Completed [facility] Orientation: Patient has been orientated to the room, unit and staff.  Family: none at bedside   Orders have been reviewed and implemented. Will continue to monitor the patient. Call light has been placed within reach and bed alarm has been activated.

## 2024-08-24 NOTE — Telephone Encounter (Signed)
 Patient referred to infusion pharmacy team for ambulatory infusion of IV iron .  Insurance - Engineer, Maintenance (it) of care - Site of care: [facility] Dx code - D64.9, N93.9 IV Iron  Therapy - Feraheme 510 mg x 2 Infusion appointments - Scheduling team will schedule patient as soon as possible.   Thank you,  Norton Blush, PharmD, [facility] Pharmacist [facility]

## 2024-08-24 NOTE — TOC Transition Note (Signed)
 Transition of Care [facility]) - Discharge Note   Patient Details  Name: Megan Chaney MRN: [id number] Date of Birth: December 07, 1970  Transition of [facility]) CM/SW Contact:  Lendia Dais, LCSWA Phone Number: 08/24/2024, 1:29 PM   Clinical Narrative: Pt will discharge home with family.  Pt had SDOH concerns of housing and inquired about CM support to go out into the community to help her find housing. CSW inquired about such resources with [facility] leadership and stated that there was none known.   CSW informed pt that there were no known resources as far as a CM support to travel into the community with the pt. Pt stated understanding and expressed gratitude for efforts.  Pt received a cab voucher from the NT and resources are in AVS.  No further TOC needs.     Final next level of care: Home/Self Care Barriers to Discharge: Barriers Resolved   Patient Goals and CMS Choice            Discharge Placement                Patient to be transferred to facility by: Cab   Patient and family notified of of transfer: 08/24/24  Discharge Plan and Services Additional resources added to the After Visit Summary for                                       Social Drivers of Health (SDOH) Interventions SDOH Screenings   Food Insecurity: Food Insecurity Present (08/24/2024)  Housing: High Risk (08/24/2024)  Transportation Needs: Unmet Transportation Needs (08/24/2024)  Utilities: Not At Risk (08/24/2024)  Depression (PHQ2-9): Medium Risk (08/19/2019)  Tobacco Use: Medium Risk (08/23/2024)     Readmission Risk Interventions     No data to display

## 2024-08-27 ENCOUNTER — Encounter

## 2024-08-27 ENCOUNTER — Telehealth: Payer: Self-pay | Admitting: Pharmacy Technician

## 2024-08-27 ENCOUNTER — Telehealth (HOSPITAL_COMMUNITY): Payer: Self-pay | Admitting: Internal Medicine

## 2024-08-27 NOTE — Telephone Encounter (Signed)
 Auth Submission: NO AUTH NEEDED Site of care: Site of care: [facility] Payer: [employer] Medication & CPT/J Code(s) submitted: Feraheme (ferumoxytol) [id number] Diagnosis Code: D50.9 Route of submission (phone, fax, portal):  Phone # Fax # Auth type: Buy/Bill PB Units/visits requested: X2 Reference number:  Approval from: 08/27/24 to 10/24/24

## 2024-08-29 ENCOUNTER — Telehealth: Payer: Self-pay | Admitting: Physical Therapy

## 2024-08-29 ENCOUNTER — Ambulatory Visit: Attending: Pain Medicine | Admitting: Physical Therapy

## 2024-08-29 DIAGNOSIS — M6281 Muscle weakness (generalized): Secondary | ICD-10-CM | POA: Insufficient documentation

## 2024-08-29 DIAGNOSIS — R2681 Unsteadiness on feet: Secondary | ICD-10-CM | POA: Insufficient documentation

## 2024-08-29 DIAGNOSIS — M533 Sacrococcygeal disorders, not elsewhere classified: Secondary | ICD-10-CM | POA: Insufficient documentation

## 2024-08-29 DIAGNOSIS — M25551 Pain in right hip: Secondary | ICD-10-CM | POA: Insufficient documentation

## 2024-08-29 NOTE — Telephone Encounter (Signed)
 Left patient a message about missed appointment. Reminded patient of next scheduled land and aquatics appointment and left a call back number.   Kristeen Sar, PT, DPT 08/29/24 3:14 PM

## 2024-08-31 ENCOUNTER — Encounter: Payer: Self-pay | Admitting: Physical Therapy

## 2024-08-31 ENCOUNTER — Ambulatory Visit: Admitting: Physical Therapy

## 2024-08-31 DIAGNOSIS — M6281 Muscle weakness (generalized): Secondary | ICD-10-CM

## 2024-08-31 DIAGNOSIS — R2681 Unsteadiness on feet: Secondary | ICD-10-CM

## 2024-08-31 DIAGNOSIS — M25551 Pain in right hip: Secondary | ICD-10-CM | POA: Diagnosis present

## 2024-08-31 DIAGNOSIS — M533 Sacrococcygeal disorders, not elsewhere classified: Secondary | ICD-10-CM

## 2024-08-31 NOTE — Therapy (Signed)
 OUTPATIENT PHYSICAL THERAPY THORACOLUMBAR TREATMENT and PROGRESS NOTE    Patient Name: Megan Chaney MRN: [id number] DOB:[birth year], 53 y.o., female Today's Date: 08/31/2024  END OF SESSION:  PT End of Session - 08/31/24 1513     Visit Number 9    Date for Recertification  09/13/24    Authorization Type [employer] approved 4 visits 07/27/24-08/25/24 - requesting 6 more visits on 08/13/24    Authorization Time Period 07/27/24-08/25/24    Authorization - Visit Number 4    Authorization - Number of Visits 4    Progress Note Due on Visit 18    PT Start Time 1513    PT Stop Time 1553    PT Time Calculation (min) 40 min    Activity Tolerance Patient tolerated treatment well    Behavior During Therapy [facility] for tasks assessed/performed                Past Medical History:  Diagnosis Date   Acute deep vein thrombosis (DVT) of right upper extremity (HCC) 11/03/2021   Anemia    Fibroid    GERD (gastroesophageal reflux disease)    Past Surgical History:  Procedure Laterality Date   BUBBLE STUDY  10/30/2021   Procedure: BUBBLE STUDY;  Surgeon: Delford Maude BROCKS, MD;  Location: [facility] ENDOSCOPY;  Service: Cardiovascular;;   LOOP RECORDER INSERTION N/A 10/30/2021   Procedure: LOOP RECORDER INSERTION;  Surgeon: Cindie Ole DASEN, MD;  Location: [facility] INVASIVE CV LAB;  Service: Cardiovascular;  Laterality: N/A;   MULTIPLE TOOTH EXTRACTIONS     NO PAST SURGERIES     TEE WITHOUT CARDIOVERSION N/A 10/30/2021   Procedure: TRANSESOPHAGEAL ECHOCARDIOGRAM (TEE);  Surgeon: Delford Maude BROCKS, MD;  Location: [facility] ENDOSCOPY;  Service: Cardiovascular;  Laterality: N/A;   Patient Active Problem List   Diagnosis Date Noted   Menorrhagia 08/24/2024   Vagina bleeding 08/24/2024   Anxiety 07/08/2024   Hemiplegia of dominant side as late effect of cerebrovascular disease (HCC) 07/08/2024   AVM (arteriovenous malformation) 07/18/2023   Myofascial pain dysfunction syndrome 02/15/2023   Patent foramen ovale 12/17/2022    IUD (intrauterine device) in place 05/06/2022   Hyperlipidemia 11/03/2021   Right sided weakness 11/03/2021   Symptomatic anemia 11/03/2021   History of CVA (cerebrovascular accident) 10/26/2021   Infertility counseling 10/04/2019   Enlarged uterus 07/31/2018   Uterine leiomyoma 07/31/2018   DUB (dysfunctional uterine bleeding) 07/31/2018    PCP: Leita Blind, MD  REFERRING PROVIDER: Catherene Toribio Pac, MD  REFERRING DIAG: M53.3 (ICD-10-CM) - Sacrococcygeal disorders, not elsewhere classified  Rationale for Evaluation and Treatment: Rehabilitation  THERAPY DIAG:  Sacrococcygeal disorders, not elsewhere classified  Pain in right hip  Muscle weakness (generalized)  Unsteadiness on feet  ONSET DATE: chronic  SUBJECTIVE:  SUBJECTIVE STATEMENT:  POOL ACCESS: currently none.   From initial evaluation:  Pt referred to OPPT for aquatic therapy with option to transition to land-based therapy as tolerated.  Pt fell two times on the same day in 2023 and was discovered to have sustained a stroke that day. History of multiple strokes and has Rt > Lt chronic global back pain (head to low back) which she attributes to the falls, sacroiliac pain bil and Rt hip pain.  Has done extensive aquatic PT in past which really helped.  Injections in 2024 helped for about 6 mos.  Will be getting repeat SI injections 06/04/24.  Is on disability. Pt aims to walk 15 min 2-4x/week.  PERTINENT HISTORY:  significant history of multiple strokes - has undergone extensive PT for stroke rehabilitation and low back pain management including aquatic PT, which has provided some relief  10/27/21 Multifocal acute/subacute nonhemorrhagic infarcts involving the left MCA territory.  Previous injections:  September 2024 B/L SI joint  injection - provided relief for 3-6 mos, will be scheduling repeat injections  June 2024 Rt greater trochanteric bursa  currently taking Lyrica  and Baclofen for neuropathic pain History of cardiac cath  Has loop recorder  PAIN:  PAIN:  Are you having pain? Yes NPRS scale: 4/10 back, Lt LE 7/10 (on fire) Pain location: bil LBP and SI joint pain down into LLE PAIN TYPE: aching, dull, sharp, shooting back pains Pain description: intermittent  Aggravating factors: laying down, bending Relieving factors: medications, sleep   PRECAUTIONS: None  RED FLAGS: None  WEIGHT BEARING RESTRICTIONS: No  FALLS:  Has patient fallen in last 6 months? No but has a fear of falling  LIVING ENVIRONMENT: Lives with: lives with roommates - is looking for housing Lives in: House/apartment Stairs: No Has following equipment at home: Single point cane but isn't using it lately - does better without it  OCCUPATION: disabled  PLOF: Independent with basic ADLs, Independent with household mobility without device, Independent with community mobility without device, and Independent with transfers  PATIENT GOALS: learn an aquatic and land based HEP to help manage pain and become less dependent on medications/injections  NEXT MD VISIT: 06/04/24 for bil SI injections  OBJECTIVE:  Note: Objective measures were completed at Evaluation unless otherwise noted.  DIAGNOSTIC FINDINGS:  MRI LUMBAR SPINE WITHOUT CONTRAST  L4-5: Moderate central narrowing of the thecal sac along with mild left and borderline right subarticular lateral recess stenosis due to central disc protrusion, disc bulge, and short pedicles.  PATIENT SURVEYS:  LEFS  Extreme difficulty/unable (0), Quite a bit of difficulty (1), Moderate difficulty (2), Little difficulty (3), No difficulty (4) Survey date:  05/28/24 08/13/24  Any of your usual work, housework or school activities 3 3  2. Usual hobbies, recreational or sporting activities  1 1  3. Getting into/out of the bath 2 3  4. Walking between rooms 2 2  5. Putting on socks/shoes 1 3  6. Squatting  1 2  7. Lifting an object, like a bag of groceries from the floor 1 2  8. Performing light activities around your home 2 4  9. Performing heavy activities around your home 1 1  10. Getting into/out of a car 1 2  11. Walking 2 blocks 1 3  12. Walking 1 mile 0 0  13. Going up/down 10 stairs (1 flight) 0 2  14. Standing for 1 hour 0 0  15.  sitting for 1 hour 2 2  16. Running on even  ground 0 0  17. Running on uneven ground 0 0  18. Making sharp turns while running fast 0 0  19. Hopping  0 0  20. Rolling over in bed 1 3  Score total:  19/80 32/80     08/02/2024 LEFS 21/80 26.3%  08/13/24 LEFS 32/80 = 40%  COGNITION: Overall cognitive status: Within functional limits for tasks assessed     SENSATION: Intermittent shooting pain with numbness into toes  MUSCLE LENGTH: 08/13/24: Hamstrings: Right 70 with mild spasticity deg; Left 80 deg Thomas test: negative bil - much improved hip flexor and quad length Right gluteals, piriformis symmetrical bil   Eval: Hamstrings: Right 60 with mild spasticity deg; Left 70 deg Thomas test: Right very tight can't get to 0 deg - flexed to 15 deg Right gluteals, piriformis limited 50% compared to [employer] and hip flexor on Rt signif limited  POSTURE: Rt pelvic inflare with Rt LE IR with in-toeing, some spasticity contributing  PALPATION: 08/13/24 Ongoing tightness and tenderness in Lt hamstring distally behind knee, Lt QL, mild tenderness over bil SI joints  Eval: Signif TTP Rt>Lt SI joint Signif tension/hypertonicity in Rt adductors, hamstrings, proximal quads/hip flexors, hip internal rotators Significant tension and spasm present in trunk paraspinals thoracic to lumbar bil Hypomobile Rt>Lt SI joints with pain  LUMBAR ROM:   AROM eval 08/13/24  Flexion Braces hands on thighs and walks hands down to mid-thigh  without lumbar reversal, signif pain Braces hands on thighs, lumbar flexion to hands at mid-thighs  Extension NT   Right lateral flexion 75% limitated Achieves 75%  Left lateral flexion 75% limitated Achieves 75%  Right rotation 75% limitated Achieves 50%  Left rotation 75% limitated Achieves 50%   (Blank rows = not tested)  LOWER EXTREMITY ROM:    08/13/24: Bil hip ER 50 deg Rt hip ext to 10 deg before hip flexor stretch   Eval: Rt hip ER 35 deg Rt hip ext to neutral - very tight hip flexors and quads   LOWER EXTREMITY MMT:    MMT Right eval Left eval Right 08/13/24 Left 10/20(25  Hip flexion 3+ 4+ 3+ 4+  Hip extension 3+ 4 4- 4  Hip abduction 3+ 4 4- 4+  Hip adduction 3+ 4 4 4+  Hip internal rotation 4- 4 4 4+  Hip external rotation 3+ 4 4- 4+  Knee flexion 3+ 4 4+ 4  Knee extension 4- 4 4+ 4+  Ankle dorsiflexion 4 4 4+ 4+  Ankle plantarflexion 4 4 4 4   Ankle inversion      Ankle eversion       (Blank rows = not tested)  LUMBAR SPECIAL TESTS:  SI Compression/distraction test: Positive and FABER test: Positive  FUNCTIONAL TESTS:  Eval: 5 times sit to stand: 1:01.07 signif use of hands on chair, avoids hip hinge due to pain Timed up and go (TUG): 27.29 no AD : 32.28 sec no AD  08/02/2024 5STS:28.45 sec (unilateral hand support) TUG:14.74 sec no AD walk test: 15.69 sec  08/13/24 5STS: 27.33 sec (unilateral hand support) TUG: 15.57 sec no AD walk test: 12.63  GAIT: Distance walked: 30 feet Assistive device utilized: None Level of assistance: Modified independence Comments: Rt LE IR with in-toeing, short stance time on Rt, small stride length bil, lacks trunk rotation  TREATMENT DATE:  [facility] Adult PT Treatment:  08/31/24:Pt seen for aquatic therapy today.  Treatment took place in water 3.5-4.75 ft in depth at the [facility] pool. Temp of water was 91.  Pt entered/exited the pool via  stairs independently with bilat rail.  - UE on barbell walking forward/ backward with cues for increased R step height and length- 6 lengths;  - Side stepping with yellow hand floats for horz shld add/abd 6 lengths.,  - UE on wall: leg swings into hip flexion/ extension x 15 x RT only, LT too painful; alternating single leg clams x 10 each; hip circles CW/ CCW x 10 each LE, hip abdct/add 15x - marching backward/forward with row motion with rainbow hand floats -> suitcase carry with bil / single rainbow hand float under water at side - RTLE hamstring stretch on 2nd step 3x 20 sec, verbal instructional cues -Rt knee flexion for mainly quad stretch/floss 20x, cannot do without some mild hip flexion  - 08/13/24 Reassessment (see above) Supine figure four (push knee away) and piriformis stretch (pull knee across) 3 x20 sec each bil Supine lower trunk rotation 3x20 bil Supine hamstring and adductor stretch with strap 2x20 each Sit to stand with hands on thighs x5 with focus on body mechanics  08/09/24 NuStep L4 x 5' PT present to discuss status 3-way lumbar stretch 5x5 each way Doorway Lt QL stretch 2x20 Supine figure four (push knee away) and piriformis stretch (pull knee across) 3 x20 sec each bil Supine lower trunk rotation 3x20 bil Supine hamstring and adductor stretch with strap 2x20 each Sit to stand chair + pad hands on thigh  - PT cued body mechanics for hip hinge and TA indraw - much less pressure in back with this Seated march alt LE x20 Step ups with bil rail lead with each LE x8 reps - PT cued shifting body weight into Rt LE on step before stepping up to avoid overuse of bil UE Manual therapy: Lt QL and lumbar paraspinal broadening in Rt SL   PATIENT EDUCATION:  Education details: [email or web address] Person educated: Patient Education method: Explanation Education comprehension: verbalized understanding  HOME EXERCISE PROGRAM: Access Code: [email or web address] URL:  [email or web address] Date: 08/13/2024 Prepared by: Orvil Beuhring  Exercises - Hooklying Single Knee to Chest Stretch  - 1 x daily - 7 x weekly - 3 sets - 10 reps - Supine Posterior Pelvic Tilt  - 1 x daily - 7 x weekly - 3 sets - 10 reps - Supine Figure 4 Piriformis Stretch  - 1 x daily - 7 x weekly - 3 sets - 10 reps - Supine Piriformis Stretch with Foot on Ground  - 1 x daily - 7 x weekly - 3 sets - 10 reps - Sidelying Transversus Abdominis Bracing  - 1 x daily - 7 x weekly - 1 sets - 10 reps - 5 hold - Supine Hamstring Stretch with Strap  - 1 x daily - 7 x weekly - 2 sets - 2 reps - 30 hold - Modified Thomas Stretch  - 1 x daily - 7 x weekly - 1 sets - 2 reps - 30 hold - Sit to Stand  - 2 x daily - 7 x weekly - 1 sets - 5 reps  ASSESSMENT:  CLINICAL IMPRESSION: Pt tolerated all aquatic exercises/activities well today. She reports being surprised how tight her RT leg is. She has decided to get the injection which will be in the next week or so.    From initial evaluation:  Patient is a 53  y.o. female with complex medical history who was seen today for physical therapy evaluation and treatment for chronic global back pain and Rt LE pain and weakness.  She had two falls on the same day in 2023 and was discovered to have had a stroke.  She is on disability and is in temporary housing arrangements.  Assessment today reveals significant restriction in Rt hemipelvis, hip and knee related to some hypertonicity/spasticity in Rt LE.  Pt has very restricted Rt>Lt SI joint mobility.  She performs transfers and ambulates slowly secondary to significant pain which she rates as 9-10/10.  Her times tests for 5xSTS, TUG, and support that she is at high risk for falls.  Her LEFS score is 19/80 which is signif disability.  She is scheduled to get repeat injections into bil SI joints on 06/04/24 as these gave her several months of relief last year.  Pt has had aquatic PT in the past which  was very helpful. We discussed a hybrid plan of land-based and aquatic-based PT with more efforts to get into aquatic PT schedule on the front end of PT given her pain levels and functional mobility restrictions.  Pt will benefit from skilled PT to maximize her functional strength, mobility, gait and safety, along with educate her on aquatic and land based HEP to support gains made in PT.  OBJECTIVE IMPAIRMENTS: Abnormal gait, decreased activity tolerance, decreased coordination, decreased endurance, decreased mobility, difficulty walking, decreased ROM, decreased strength, hypomobility, increased muscle spasms, impaired flexibility, impaired sensation, impaired tone, improper body mechanics, postural dysfunction, and pain.   ACTIVITY LIMITATIONS: carrying, lifting, bending, sitting, standing, squatting, sleeping, transfers, bed mobility, bathing, dressing, hygiene/grooming, and locomotion level  PARTICIPATION LIMITATIONS: cleaning, laundry, driving, shopping, and community activity  PERSONAL FACTORS: 1-2 comorbidities: history of multiple strokes, cardiac history, on disability, time since onset are also affecting patient's functional outcome.   REHAB POTENTIAL: Excellent  CLINICAL DECISION MAKING: Evolving/moderate complexity  EVALUATION COMPLEXITY: Moderate   GOALS: Goals reviewed with patient? Yes  SHORT TERM GOALS: Target date: 06/25/24  Pt will be ind with initial HEP for both land and aquatic environments without exacerbation of pain Baseline: Goal status: MET 07/26/2024  2.  Pt will improve 5x STS to 45 sec or less using proper body mechanics and mod use of UE Baseline:  Goal status: MET 08/02/2024  3.  Pt will improve lumbar ROM to allow greater ease for bending to don shoes/socks Baseline:  Goal status: PARTIALLY MET - INCONSISTENT DEPENDING ON DAY 08/13/24  4.  Pt will report improved pain to 7-8/10 with ADLs Baseline: 9-10/10 Goal status: MET (6-7/10) 08/02/2024  5.  Pt  will be able to perform aquatic ambulation for 10 min with brief rests as needed to work on cardiovascular endurance and functional ROM/strength of bil LE Baseline:  Goal status: MET 08/02/2024    LONG TERM GOALS: Target date: 09/13/2024   Pt will be ind with advanced/final HEP for aquatic and land based HEP to maintain gains made in PT Baseline:  Goal status: IN PROGRESS 08/13/2024  2.  LEFS score to improve by at least 8 points to demo reduced limitations Baseline: 19/80 Goal status: MET 08/13/24  3.  TUG test to improve to 22 sec or less to demo improved functional transfer and gait safety and mobility. Baseline: 27.29 Goal status: MET 08/02/2024  4.  Pt will improve Rt quad and hip flexor mobility to allow for full stride length of Lt LE for more efficient smooth gait  Baseline:  Goal status: MET 08/13/24  5.  Pt will perform in 25 sec or less to demo improved gait speed for community access. Baseline:  Goal status: MET 08/02/2024  6.  Pt will achieve at least 4/5 Rt LE strength to allow for improved stairs to access housing options with stairs. Baseline:  Goal status: IN PROGRESS 08/13/2024   7.  TUG test to improve to 12sec or less to demo improved functional transfer and gait safety and mobility.  Baseline 15.57  Goal status: ONGOING 08/13/24  PLAN:  PT FREQUENCY: 2x/week   PT DURATION: 4 weeks  PLANNED INTERVENTIONS: 97110-Therapeutic exercises, 97530- Therapeutic activity, 97112- Neuromuscular re-education, 97535- Self Care, [id number]- Manual therapy, [phone]- Gait training, [phone]- Aquatic Therapy, [phone]- Electrical stimulation (unattended), [phone]- Traction (mechanical), [phone] (1-2 muscles), [id number] (3+ muscles)- Dry Needling, Patient/Family education, Balance training, Stair training, Taping, Joint mobilization, Spinal mobilization, DME instructions, Cryotherapy, and Moist heat.  PLAN FOR NEXT SESSION: aquatic: walking for endurance and LE mobility, Rt LE mobility  into hip ext, Rt knee flexion to stretch quad and hip flexor, hamstring stretching, Rt hip ER (Rt hemipelvis is IR with in-toeing - has some spasticity), land: review HEP, try gentle stretching of Rt hip toward ER, ext, knee flexion for anterior thigh and hip stretching, gentle spine ROM, manual therapy, sit to stand  Delon Darner, PTA 08/31/24 3:54 PM

## 2024-09-03 ENCOUNTER — Encounter: Payer: Self-pay | Admitting: Internal Medicine

## 2024-09-04 ENCOUNTER — Ambulatory Visit: Admitting: Physical Therapy

## 2024-09-04 ENCOUNTER — Encounter: Payer: Self-pay | Admitting: Physical Therapy

## 2024-09-04 DIAGNOSIS — M533 Sacrococcygeal disorders, not elsewhere classified: Secondary | ICD-10-CM | POA: Diagnosis not present

## 2024-09-04 DIAGNOSIS — R2681 Unsteadiness on feet: Secondary | ICD-10-CM

## 2024-09-04 DIAGNOSIS — M25551 Pain in right hip: Secondary | ICD-10-CM

## 2024-09-04 DIAGNOSIS — M6281 Muscle weakness (generalized): Secondary | ICD-10-CM

## 2024-09-04 NOTE — Therapy (Signed)
 OUTPATIENT PHYSICAL THERAPY THORACOLUMBAR TREATMENT     Patient Name: Megan Chaney MRN: [id number] DOB:07-02-1971, 53 y.o., female Today's Date: 09/04/2024  END OF SESSION:  PT End of Session - 09/04/24 1450     Visit Number 10    Date for Recertification  09/13/24    Authorization Type [employer] Approved 12vl, 08/29/2024 - 11/26/2024 auth# [id number]    Authorization Time Period 08/29/2024 - 11/26/2024    Authorization - Visit Number 2    Authorization - Number of Visits 12    PT Start Time 1400    PT Stop Time 1447    PT Time Calculation (min) 47 min    Activity Tolerance Patient tolerated treatment well    Behavior During Therapy [facility] for tasks assessed/performed                 Past Medical History:  Diagnosis Date   Acute deep vein thrombosis (DVT) of right upper extremity (HCC) 11/03/2021   Anemia    Fibroid    GERD (gastroesophageal reflux disease)    Past Surgical History:  Procedure Laterality Date   BUBBLE STUDY  10/30/2021   Procedure: BUBBLE STUDY;  Surgeon: Delford Maude BROCKS, MD;  Location: [facility] ENDOSCOPY;  Service: Cardiovascular;;   LOOP RECORDER INSERTION N/A 10/30/2021   Procedure: LOOP RECORDER INSERTION;  Surgeon: Cindie Ole DASEN, MD;  Location: [facility] INVASIVE CV LAB;  Service: Cardiovascular;  Laterality: N/A;   MULTIPLE TOOTH EXTRACTIONS     NO PAST SURGERIES     TEE WITHOUT CARDIOVERSION N/A 10/30/2021   Procedure: TRANSESOPHAGEAL ECHOCARDIOGRAM (TEE);  Surgeon: Delford Maude BROCKS, MD;  Location: [facility] ENDOSCOPY;  Service: Cardiovascular;  Laterality: N/A;   Patient Active Problem List   Diagnosis Date Noted   Menorrhagia 08/24/2024   Vagina bleeding 08/24/2024   Anxiety 07/08/2024   Hemiplegia of dominant side as late effect of cerebrovascular disease (HCC) 07/08/2024   AVM (arteriovenous malformation) 07/18/2023   Myofascial pain dysfunction syndrome 02/15/2023   Patent foramen ovale 12/17/2022   IUD (intrauterine device) in place 05/06/2022    Hyperlipidemia 11/03/2021   Right sided weakness 11/03/2021   Symptomatic anemia 11/03/2021   History of CVA (cerebrovascular accident) 10/26/2021   Infertility counseling 10/04/2019   Enlarged uterus 07/31/2018   Uterine leiomyoma 07/31/2018   DUB (dysfunctional uterine bleeding) 07/31/2018    PCP: Leita Blind, MD  REFERRING PROVIDER: Catherene Toribio Pac, MD  REFERRING DIAG: M53.3 (ICD-10-CM) - Sacrococcygeal disorders, not elsewhere classified  Rationale for Evaluation and Treatment: Rehabilitation  THERAPY DIAG:  Sacrococcygeal disorders, not elsewhere classified  Pain in right hip  Muscle weakness (generalized)  Unsteadiness on feet  ONSET DATE: chronic  SUBJECTIVE:  SUBJECTIVE STATEMENT: Patient presents with increased left leg pain today. She verbalized 7/10 pain. She is scheduled to get more iron  and blood infusions but she is not sure if it will be in [place] or [place].   POOL ACCESS: currently none.   From initial evaluation:  Pt referred to OPPT for aquatic therapy with option to transition to land-based therapy as tolerated.  Pt fell two times on the same day in 2023 and was discovered to have sustained a stroke that day. History of multiple strokes and has Rt > Lt chronic global back pain (head to low back) which she attributes to the falls, sacroiliac pain bil and Rt hip pain.  Has done extensive aquatic PT in past which really helped.  Injections in 2024 helped for about 6 mos.  Will be getting repeat SI injections 06/04/24.  Is on disability. Pt aims to walk 15 min 2-4x/week.  PERTINENT HISTORY:  significant history of multiple strokes - has undergone extensive PT for stroke rehabilitation and low back pain management including aquatic PT, which has provided some  relief  10/27/21 Multifocal acute/subacute nonhemorrhagic infarcts involving the left MCA territory.  Previous injections:  September 2024 B/L SI joint injection - provided relief for 3-6 mos, will be scheduling repeat injections  June 2024 Rt greater trochanteric bursa  currently taking Lyrica  and Baclofen for neuropathic pain History of cardiac cath  Has loop recorder  PAIN:  PAIN:  Are you having pain? Yes NPRS scale: 4/10 back, Lt LE 7/10 (on fire) Pain location: bil LBP and SI joint pain down into LLE PAIN TYPE: aching, dull, sharp, shooting back pains Pain description: intermittent  Aggravating factors: laying down, bending Relieving factors: medications, sleep   PRECAUTIONS: None  RED FLAGS: None  WEIGHT BEARING RESTRICTIONS: No  FALLS:  Has patient fallen in last 6 months? No but has a fear of falling  LIVING ENVIRONMENT: Lives with: lives with roommates - is looking for housing Lives in: House/apartment Stairs: No Has following equipment at home: Single point cane but isn't using it lately - does better without it  OCCUPATION: disabled  PLOF: Independent with basic ADLs, Independent with household mobility without device, Independent with community mobility without device, and Independent with transfers  PATIENT GOALS: learn an aquatic and land based HEP to help manage pain and become less dependent on medications/injections  NEXT MD VISIT: 06/04/24 for bil SI injections  OBJECTIVE:  Note: Objective measures were completed at Evaluation unless otherwise noted.  DIAGNOSTIC FINDINGS:  MRI LUMBAR SPINE WITHOUT CONTRAST  L4-5: Moderate central narrowing of the thecal sac along with mild left and borderline right subarticular lateral recess stenosis due to central disc protrusion, disc bulge, and short pedicles.  PATIENT SURVEYS:  LEFS  Extreme difficulty/unable (0), Quite a bit of difficulty (1), Moderate difficulty (2), Little difficulty (3), No  difficulty (4) Survey date:  05/28/24 08/13/24  Any of your usual work, housework or school activities 3 3  2. Usual hobbies, recreational or sporting activities 1 1  3. Getting into/out of the bath 2 3  4. Walking between rooms 2 2  5. Putting on socks/shoes 1 3  6. Squatting  1 2  7. Lifting an object, like a bag of groceries from the floor 1 2  8. Performing light activities around your home 2 4  9. Performing heavy activities around your home 1 1  10. Getting into/out of a car 1 2  11. Walking 2 blocks 1 3  12.  Walking 1 mile 0 0  13. Going up/down 10 stairs (1 flight) 0 2  14. Standing for 1 hour 0 0  15.  sitting for 1 hour 2 2  16. Running on even ground 0 0  17. Running on uneven ground 0 0  18. Making sharp turns while running fast 0 0  19. Hopping  0 0  20. Rolling over in bed 1 3  Score total:  19/80 32/80     08/02/2024 LEFS 21/80 26.3%  08/13/24 LEFS 32/80 = 40%  COGNITION: Overall cognitive status: Within functional limits for tasks assessed     SENSATION: Intermittent shooting pain with numbness into toes  MUSCLE LENGTH: 08/13/24: Hamstrings: Right 70 with mild spasticity deg; Left 80 deg Thomas test: negative bil - much improved hip flexor and quad length Right gluteals, piriformis symmetrical bil   Eval: Hamstrings: Right 60 with mild spasticity deg; Left 70 deg Thomas test: Right very tight can't get to 0 deg - flexed to 15 deg Right gluteals, piriformis limited 50% compared to [employer] and hip flexor on Rt signif limited  POSTURE: Rt pelvic inflare with Rt LE IR with in-toeing, some spasticity contributing  PALPATION: 08/13/24 Ongoing tightness and tenderness in Lt hamstring distally behind knee, Lt QL, mild tenderness over bil SI joints  Eval: Signif TTP Rt>Lt SI joint Signif tension/hypertonicity in Rt adductors, hamstrings, proximal quads/hip flexors, hip internal rotators Significant tension and spasm present in trunk paraspinals thoracic  to lumbar bil Hypomobile Rt>Lt SI joints with pain  LUMBAR ROM:   AROM eval 08/13/24  Flexion Braces hands on thighs and walks hands down to mid-thigh without lumbar reversal, signif pain Braces hands on thighs, lumbar flexion to hands at mid-thighs  Extension NT   Right lateral flexion 75% limitated Achieves 75%  Left lateral flexion 75% limitated Achieves 75%  Right rotation 75% limitated Achieves 50%  Left rotation 75% limitated Achieves 50%   (Blank rows = not tested)  LOWER EXTREMITY ROM:    08/13/24: Bil hip ER 50 deg Rt hip ext to 10 deg before hip flexor stretch   Eval: Rt hip ER 35 deg Rt hip ext to neutral - very tight hip flexors and quads   LOWER EXTREMITY MMT:    MMT Right eval Left eval Right 08/13/24 Left 10/20(25  Hip flexion 3+ 4+ 3+ 4+  Hip extension 3+ 4 4- 4  Hip abduction 3+ 4 4- 4+  Hip adduction 3+ 4 4 4+  Hip internal rotation 4- 4 4 4+  Hip external rotation 3+ 4 4- 4+  Knee flexion 3+ 4 4+ 4  Knee extension 4- 4 4+ 4+  Ankle dorsiflexion 4 4 4+ 4+  Ankle plantarflexion 4 4 4 4   Ankle inversion      Ankle eversion       (Blank rows = not tested)  LUMBAR SPECIAL TESTS:  SI Compression/distraction test: Positive and FABER test: Positive  FUNCTIONAL TESTS:  Eval: 5 times sit to stand: 1:01.07 signif use of hands on chair, avoids hip hinge due to pain Timed up and go (TUG): 27.29 no AD : 32.28 sec no AD  08/02/2024 5STS:28.45 sec (unilateral hand support) TUG:14.74 sec no AD walk test: 15.69 sec  08/13/24 5STS: 27.33 sec (unilateral hand support) TUG: 15.57 sec no AD walk test: 12.63  09/04/2024 5STS: 30.50sec (bilateral UE support) TUG:15. 86 sec no AD  GAIT: Distance walked: 30 feet Assistive device utilized: None Level of assistance: Modified  independence Comments: Rt LE IR with in-toeing, short stance time on Rt, small stride length bil, lacks trunk rotation  TREATMENT DATE:  09/04/24 NuStep L3 x  5' PT present to discuss status 5STS: 30.50sec (bilateral UE support) TUG:15. 86 sec no AD Discussion of POC and plan once therapy is over Supine hamstring and adductor stretch with strap 2x20 each Supine lower trunk rotation 3x20 bil Supine PPT x 20 PPT + bent knee fall out x 10 bilateral  Supine bridge x 10 Seated march x 20 total  Manual: STM with addaday for decreased pain and improved muscle elongation.     [facility] Adult PT Treatment:                                              08/31/24:Pt seen for aquatic therapy today.  Treatment took place in water 3.5-4.75 ft in depth at the [facility] pool. Temp of water was 91.  Pt entered/exited the pool via stairs independently with bilat rail.  - UE on barbell walking forward/ backward with cues for increased R step height and length- 6 lengths;  - Side stepping with yellow hand floats for horz shld add/abd 6 lengths.,  - UE on wall: leg swings into hip flexion/ extension x 15 x RT only, LT too painful; alternating single leg clams x 10 each; hip circles CW/ CCW x 10 each LE, hip abdct/add 15x - marching backward/forward with row motion with rainbow hand floats -> suitcase carry with bil / single rainbow hand float under water at side - RTLE hamstring stretch on 2nd step 3x 20 sec, verbal instructional cues -Rt knee flexion for mainly quad stretch/floss 20x, cannot do without some mild hip flexion  - 08/13/24 Reassessment (see above) Supine figure four (push knee away) and piriformis stretch (pull knee across) 3 x20 sec each bil Supine lower trunk rotation 3x20 bil Supine hamstring and adductor stretch with strap 2x20 each Sit to stand with hands on thighs x5 with focus on body mechanics  08/09/24 NuStep L4 x 5' PT present to discuss status 3-way lumbar stretch 5x5 each way Doorway Lt QL stretch 2x20 Supine figure four (push knee away) and piriformis stretch (pull knee across) 3 x20 sec each bil Supine lower trunk  rotation 3x20 bil Supine hamstring and adductor stretch with strap 2x20 each Sit to stand chair + pad hands on thigh  - PT cued body mechanics for hip hinge and TA indraw - much less pressure in back with this Seated march alt LE x20 Step ups with bil rail lead with each LE x8 reps - PT cued shifting body weight into Rt LE on step before stepping up to avoid overuse of bil UE Manual therapy: Lt QL and lumbar paraspinal broadening in Rt SL   PATIENT EDUCATION:  Education details: [email or web address] Person educated: Patient Education method: Explanation Education comprehension: verbalized understanding  HOME EXERCISE PROGRAM: Access Code: [email or web address] URL: [email or web address] Date: 08/13/2024 Prepared by: Orvil Beuhring  Exercises - Hooklying Single Knee to Chest Stretch  - 1 x daily - 7 x weekly - 3 sets - 10 reps - Supine Posterior Pelvic Tilt  - 1 x daily - 7 x weekly - 3 sets - 10 reps - Supine Figure 4 Piriformis Stretch  - 1 x daily - 7 x weekly - 3 sets - 10 reps - Supine Piriformis Stretch with  Foot on Ground  - 1 x daily - 7 x weekly - 3 sets - 10 reps - Sidelying Transversus Abdominis Bracing  - 1 x daily - 7 x weekly - 1 sets - 10 reps - 5 hold - Supine Hamstring Stretch with Strap  - 1 x daily - 7 x weekly - 2 sets - 2 reps - 30 hold - Modified Thomas Stretch  - 1 x daily - 7 x weekly - 1 sets - 2 reps - 30 hold - Sit to Stand  - 2 x daily - 7 x weekly - 1 sets - 5 reps  ASSESSMENT:  CLINICAL IMPRESSION:  Delorse presents with increased left leg pain today. She verbalized 7/10 pain. Discussed POC and the plan of discharge vs re-certification. She is scheduled to get an epidural injection on 11/21 if everything goes well. She also needs to get two rounds of blood and iron  infusions. She has had a few hospitalizations over the last few months due to low iron  levels. PT and patient agreed that the best course of action would be to discharge at end of POC so patient has  time to recover from surgery and get the necessary infusions. At the start of the year if she feels she needs more therapy she can return with a referral from provider. This will ensure she can have her full focus on physical therapy and make good progress.    From initial evaluation:  Patient is a 53 y.o. female with complex medical history who was seen today for physical therapy evaluation and treatment for chronic global back pain and Rt LE pain and weakness.  She had two falls on the same day in 2023 and was discovered to have had a stroke.  She is on disability and is in temporary housing arrangements.  Assessment today reveals significant restriction in Rt hemipelvis, hip and knee related to some hypertonicity/spasticity in Rt LE.  Pt has very restricted Rt>Lt SI joint mobility.  She performs transfers and ambulates slowly secondary to significant pain which she rates as 9-10/10.  Her times tests for 5xSTS, TUG, and support that she is at high risk for falls.  Her LEFS score is 19/80 which is signif disability.  She is scheduled to get repeat injections into bil SI joints on 06/04/24 as these gave her several months of relief last year.  Pt has had aquatic PT in the past which was very helpful. We discussed a hybrid plan of land-based and aquatic-based PT with more efforts to get into aquatic PT schedule on the front end of PT given her pain levels and functional mobility restrictions.  Pt will benefit from skilled PT to maximize her functional strength, mobility, gait and safety, along with educate her on aquatic and land based HEP to support gains made in PT.  OBJECTIVE IMPAIRMENTS: Abnormal gait, decreased activity tolerance, decreased coordination, decreased endurance, decreased mobility, difficulty walking, decreased ROM, decreased strength, hypomobility, increased muscle spasms, impaired flexibility, impaired sensation, impaired tone, improper body mechanics, postural dysfunction, and pain.    ACTIVITY LIMITATIONS: carrying, lifting, bending, sitting, standing, squatting, sleeping, transfers, bed mobility, bathing, dressing, hygiene/grooming, and locomotion level  PARTICIPATION LIMITATIONS: cleaning, laundry, driving, shopping, and community activity  PERSONAL FACTORS: 1-2 comorbidities: history of multiple strokes, cardiac history, on disability, time since onset are also affecting patient's functional outcome.   REHAB POTENTIAL: Excellent  CLINICAL DECISION MAKING: Evolving/moderate complexity  EVALUATION COMPLEXITY: Moderate   GOALS: Goals reviewed with patient? Yes  SHORT TERM GOALS: Target date: 06/25/24  Pt will be ind with initial HEP for both land and aquatic environments without exacerbation of pain Baseline: Goal status: MET 07/26/2024  2.  Pt will improve 5x STS to 45 sec or less using proper body mechanics and mod use of UE Baseline:  Goal status: MET 08/02/2024  3.  Pt will improve lumbar ROM to allow greater ease for bending to don shoes/socks Baseline:  Goal status: PARTIALLY MET - INCONSISTENT DEPENDING ON DAY 09/04/24  4.  Pt will report improved pain to 7-8/10 with ADLs Baseline: 9-10/10 Goal status: MET (6-7/10) 08/02/2024  5.  Pt will be able to perform aquatic ambulation for 10 min with brief rests as needed to work on cardiovascular endurance and functional ROM/strength of bil LE Baseline:  Goal status: MET 08/02/2024    LONG TERM GOALS: Target date: 10/16/2024   Pt will be ind with advanced/final HEP for aquatic and land based HEP to maintain gains made in PT Baseline:  Goal status: IN PROGRESS 09/04/2024  2.  LEFS score to improve by at least 8 points to demo reduced limitations Baseline: 19/80 Goal status: MET 08/13/24  3.  TUG test to improve to 22 sec or less to demo improved functional transfer and gait safety and mobility. Baseline: 27.29 Goal status: MET 08/02/2024  4.  Pt will improve Rt quad and hip flexor mobility to  allow for full stride length of Lt LE for more efficient smooth gait Baseline:  Goal status: MET 08/13/24  5.  Pt will perform in 25 sec or less to demo improved gait speed for community access. Baseline:  Goal status: MET 08/02/2024  6.  Pt will achieve at least 4/5 Rt LE strength to allow for improved stairs to access housing options with stairs. Baseline:  Goal status: IN PROGRESS 08/13/2024   7.  TUG test to improve to 12sec or less to demo improved functional transfer and gait safety and mobility.  Baseline 15.57  Goal status: ONGOING 09/04/24    PLAN:  PT FREQUENCY: 2x/week   PT DURATION: 4 weeks  PLANNED INTERVENTIONS: 97110-Therapeutic exercises, 97530- Therapeutic activity, 97112- Neuromuscular re-education, 97535- Self Care, [id number]- Manual therapy, [phone]- Gait training, [phone]- Aquatic Therapy, [phone]- Electrical stimulation (unattended), [phone]- Traction (mechanical), [phone] (1-2 muscles), [phone] (3+ muscles)- Dry Needling, Patient/Family education, Balance training, Stair training, Taping, Joint mobilization, Spinal mobilization, DME instructions, Cryotherapy, and Moist heat.  PLAN FOR NEXT SESSION: aquatic: walking for endurance and LE mobility, Rt LE mobility into hip ext, Rt knee flexion to stretch quad and hip flexor, hamstring stretching, Rt hip ER (Rt hemipelvis is IR with in-toeing - has some spasticity), land: review HEP, try gentle stretching of Rt hip toward ER, ext, knee flexion for anterior thigh and hip stretching, gentle spine ROM, manual therapy, sit to stand; plan to d/c at end of POC  Kristeen Sar, PT, DPT 09/04/24 2:52 PM

## 2024-09-07 ENCOUNTER — Ambulatory Visit (HOSPITAL_BASED_OUTPATIENT_CLINIC_OR_DEPARTMENT_OTHER): Attending: Pain Medicine | Admitting: Physical Therapy

## 2024-09-07 ENCOUNTER — Encounter (HOSPITAL_BASED_OUTPATIENT_CLINIC_OR_DEPARTMENT_OTHER): Payer: Self-pay | Admitting: Physical Therapy

## 2024-09-07 DIAGNOSIS — R2681 Unsteadiness on feet: Secondary | ICD-10-CM | POA: Diagnosis present

## 2024-09-07 DIAGNOSIS — M6281 Muscle weakness (generalized): Secondary | ICD-10-CM | POA: Diagnosis present

## 2024-09-07 DIAGNOSIS — M533 Sacrococcygeal disorders, not elsewhere classified: Secondary | ICD-10-CM | POA: Insufficient documentation

## 2024-09-07 DIAGNOSIS — M25551 Pain in right hip: Secondary | ICD-10-CM | POA: Insufficient documentation

## 2024-09-07 NOTE — Therapy (Signed)
 OUTPATIENT PHYSICAL THERAPY THORACOLUMBAR TREATMENT     Patient Name: Megan Chaney MRN: [id number] DOB:11-Jul-1971, 53 y.o., female Today's Date: 09/07/2024  END OF SESSION:  PT End of Session - 09/07/24 0912     Visit Number 11    Date for Recertification  09/13/24    Authorization Type [employer] Approved 12vl, 08/29/2024 - 11/26/2024 auth# [id number]    Authorization Time Period 08/29/2024 - 11/26/2024    Authorization - Visit Number 3    Authorization - Number of Visits 12    Progress Note Due on Visit 18    PT Start Time 0913    PT Stop Time 0955    PT Time Calculation (min) 42 min    Activity Tolerance Patient tolerated treatment well    Behavior During Therapy [facility] for tasks assessed/performed         Past Medical History:  Diagnosis Date   Acute deep vein thrombosis (DVT) of right upper extremity (HCC) 11/03/2021   Anemia    Fibroid    GERD (gastroesophageal reflux disease)    Past Surgical History:  Procedure Laterality Date   BUBBLE STUDY  10/30/2021   Procedure: BUBBLE STUDY;  Surgeon: Delford Maude BROCKS, MD;  Location: [facility] ENDOSCOPY;  Service: Cardiovascular;;   LOOP RECORDER INSERTION N/A 10/30/2021   Procedure: LOOP RECORDER INSERTION;  Surgeon: Cindie Ole DASEN, MD;  Location: [facility] INVASIVE CV LAB;  Service: Cardiovascular;  Laterality: N/A;   MULTIPLE TOOTH EXTRACTIONS     NO PAST SURGERIES     TEE WITHOUT CARDIOVERSION N/A 10/30/2021   Procedure: TRANSESOPHAGEAL ECHOCARDIOGRAM (TEE);  Surgeon: Delford Maude BROCKS, MD;  Location: [facility] ENDOSCOPY;  Service: Cardiovascular;  Laterality: N/A;   Patient Active Problem List   Diagnosis Date Noted   Menorrhagia 08/24/2024   Vagina bleeding 08/24/2024   Anxiety 07/08/2024   Hemiplegia of dominant side as late effect of cerebrovascular disease (HCC) 07/08/2024   AVM (arteriovenous malformation) 07/18/2023   Myofascial pain dysfunction syndrome 02/15/2023   Patent foramen ovale 12/17/2022   IUD (intrauterine device) in place  05/06/2022   Hyperlipidemia 11/03/2021   Right sided weakness 11/03/2021   Symptomatic anemia 11/03/2021   History of CVA (cerebrovascular accident) 10/26/2021   Infertility counseling 10/04/2019   Enlarged uterus 07/31/2018   Uterine leiomyoma 07/31/2018   DUB (dysfunctional uterine bleeding) 07/31/2018    PCP: Leita Blind, MD  REFERRING PROVIDER: Catherene Toribio Pac, MD  REFERRING DIAG: M53.3 (ICD-10-CM) - Sacrococcygeal disorders, not elsewhere classified  Rationale for Evaluation and Treatment: Rehabilitation  THERAPY DIAG:  Sacrococcygeal disorders, not elsewhere classified  Pain in right hip  Muscle weakness (generalized)  Unsteadiness on feet  ONSET DATE: chronic  SUBJECTIVE:  SUBJECTIVE STATEMENT: Patient presents with continued left leg pain. She reports that she feels like she is making progress.  She states she did well after last land session.    POOL ACCESS: currently none.   From initial evaluation:  Pt referred to OPPT for aquatic therapy with option to transition to land-based therapy as tolerated.  Pt fell two times on the same day in 2023 and was discovered to have sustained a stroke that day. History of multiple strokes and has Rt > Lt chronic global back pain (head to low back) which she attributes to the falls, sacroiliac pain bil and Rt hip pain.  Has done extensive aquatic PT in past which really helped.  Injections in 2024 helped for about 6 mos.  Will be getting repeat SI injections 06/04/24.  Is on disability. Pt aims to walk 15 min 2-4x/week.  PERTINENT HISTORY:  significant history of multiple strokes - has undergone extensive PT for stroke rehabilitation and low back pain management including aquatic PT, which has provided some relief  10/27/21 Multifocal  acute/subacute nonhemorrhagic infarcts involving the left MCA territory.  Previous injections:  September 2024 B/L SI joint injection - provided relief for 3-6 mos, will be scheduling repeat injections  June 2024 Rt greater trochanteric bursa  currently taking Lyrica  and Baclofen for neuropathic pain History of cardiac cath  Has loop recorder  PAIN:  PAIN:  Are you having pain? Yes NPRS scale:6/10  Pain location:  Lt buttock into Lt LE to toes PAIN TYPE: throbbing Pain description: intermittent  Aggravating factors: laying down, bending Relieving factors: medications, sleep   PRECAUTIONS: None  RED FLAGS: None  WEIGHT BEARING RESTRICTIONS: No  FALLS:  Has patient fallen in last 6 months? No but has a fear of falling  LIVING ENVIRONMENT: Lives with: lives with roommates - is looking for housing Lives in: House/apartment Stairs: No Has following equipment at home: Single point cane but isn't using it lately - does better without it  OCCUPATION: disabled  PLOF: Independent with basic ADLs, Independent with household mobility without device, Independent with community mobility without device, and Independent with transfers  PATIENT GOALS: learn an aquatic and land based HEP to help manage pain and become less dependent on medications/injections  NEXT MD VISIT: 06/04/24 for bil SI injections  OBJECTIVE:  Note: Objective measures were completed at Evaluation unless otherwise noted.  DIAGNOSTIC FINDINGS:  MRI LUMBAR SPINE WITHOUT CONTRAST  L4-5: Moderate central narrowing of the thecal sac along with mild left and borderline right subarticular lateral recess stenosis due to central disc protrusion, disc bulge, and short pedicles.  PATIENT SURVEYS:  LEFS  Extreme difficulty/unable (0), Quite a bit of difficulty (1), Moderate difficulty (2), Little difficulty (3), No difficulty (4) Survey date:  05/28/24 08/13/24  Any of your usual work, housework or school activities 3  3  2. Usual hobbies, recreational or sporting activities 1 1  3. Getting into/out of the bath 2 3  4. Walking between rooms 2 2  5. Putting on socks/shoes 1 3  6. Squatting  1 2  7. Lifting an object, like a bag of groceries from the floor 1 2  8. Performing light activities around your home 2 4  9. Performing heavy activities around your home 1 1  10. Getting into/out of a car 1 2  11. Walking 2 blocks 1 3  12. Walking 1 mile 0 0  13. Going up/down 10 stairs (1 flight) 0 2  14. Standing for 1  hour 0 0  15.  sitting for 1 hour 2 2  16. Running on even ground 0 0  17. Running on uneven ground 0 0  18. Making sharp turns while running fast 0 0  19. Hopping  0 0  20. Rolling over in bed 1 3  Score total:  19/80 32/80     08/02/2024 LEFS 21/80 26.3%  08/13/24 LEFS 32/80 = 40%  COGNITION: Overall cognitive status: Within functional limits for tasks assessed     SENSATION: Intermittent shooting pain with numbness into toes  MUSCLE LENGTH: 08/13/24: Hamstrings: Right 70 with mild spasticity deg; Left 80 deg Thomas test: negative bil - much improved hip flexor and quad length Right gluteals, piriformis symmetrical bil   Eval: Hamstrings: Right 60 with mild spasticity deg; Left 70 deg Thomas test: Right very tight can't get to 0 deg - flexed to 15 deg Right gluteals, piriformis limited 50% compared to [employer] and hip flexor on Rt signif limited  POSTURE: Rt pelvic inflare with Rt LE IR with in-toeing, some spasticity contributing  PALPATION: 08/13/24 Ongoing tightness and tenderness in Lt hamstring distally behind knee, Lt QL, mild tenderness over bil SI joints  Eval: Signif TTP Rt>Lt SI joint Signif tension/hypertonicity in Rt adductors, hamstrings, proximal quads/hip flexors, hip internal rotators Significant tension and spasm present in trunk paraspinals thoracic to lumbar bil Hypomobile Rt>Lt SI joints with pain  LUMBAR ROM:   AROM eval 08/13/24  Flexion  Braces hands on thighs and walks hands down to mid-thigh without lumbar reversal, signif pain Braces hands on thighs, lumbar flexion to hands at mid-thighs  Extension NT   Right lateral flexion 75% limitated Achieves 75%  Left lateral flexion 75% limitated Achieves 75%  Right rotation 75% limitated Achieves 50%  Left rotation 75% limitated Achieves 50%   (Blank rows = not tested)  LOWER EXTREMITY ROM:    08/13/24: Bil hip ER 50 deg Rt hip ext to 10 deg before hip flexor stretch   Eval: Rt hip ER 35 deg Rt hip ext to neutral - very tight hip flexors and quads   LOWER EXTREMITY MMT:    MMT Right eval Left eval Right 08/13/24 Left 10/20(25  Hip flexion 3+ 4+ 3+ 4+  Hip extension 3+ 4 4- 4  Hip abduction 3+ 4 4- 4+  Hip adduction 3+ 4 4 4+  Hip internal rotation 4- 4 4 4+  Hip external rotation 3+ 4 4- 4+  Knee flexion 3+ 4 4+ 4  Knee extension 4- 4 4+ 4+  Ankle dorsiflexion 4 4 4+ 4+  Ankle plantarflexion 4 4 4 4   Ankle inversion      Ankle eversion       (Blank rows = not tested)  LUMBAR SPECIAL TESTS:  SI Compression/distraction test: Positive and FABER test: Positive  FUNCTIONAL TESTS:  Eval: 5 times sit to stand: 1:01.07 signif use of hands on chair, avoids hip hinge due to pain Timed up and go (TUG): 27.29 no AD : 32.28 sec no AD  08/02/2024 5STS:28.45 sec (unilateral hand support) TUG:14.74 sec no AD walk test: 15.69 sec  08/13/24 5STS: 27.33 sec (unilateral hand support) TUG: 15.57 sec no AD walk test: 12.63  09/04/2024 5STS: 30.50sec (bilateral UE support) TUG:15. 86 sec no AD  GAIT: Distance walked: 30 feet Assistive device utilized: None Level of assistance: Modified independence Comments: Rt LE IR with in-toeing, short stance time on Rt, small stride length bil, lacks trunk rotation  TREATMENT DATE:  09/07/24:Pt seen for aquatic therapy today.  Treatment took place in water 3.5-4.75 ft in depth at the [facility] pool. Temp of water was 91.  Pt entered/exited the pool via stairs independently with bilat rail.  - UE on barbell walking forward/ backward with cues for increased R step height and length - 3 laps; Side stepping 2 laps - UE On wall: heel raises x 8 (increased pain in BLE); relaxed squat; bil clams in relaxed squat x 10 - noodle under arms, UE on corner:  circuit of cycling, hip abdct /add, hip flexion/extension suspended - in 86ft: walking forward/ backward with reciprocal arm swing - seated on yellow noodle- stool scoots backward and forward 3 laps (good tolerance), sideways with gentle bounces; arm horiz abdct/ add under the water with core engaged - plank with hands on bench with gentle alternating hip extension   09/04/24 NuStep L3 x 5' PT present to discuss status 5STS: 30.50sec (bilateral UE support) TUG:15. 86 sec no AD Discussion of POC and plan once therapy is over Supine hamstring and adductor stretch with strap 2x20 each Supine lower trunk rotation 3x20 bil Supine PPT x 20 PPT + bent knee fall out x 10 bilateral  Supine bridge x 10 Seated march x 20 total  Manual: STM with addaday for decreased pain and improved muscle elongation.     [facility] Adult PT Treatment:                                              08/31/24:Pt seen for aquatic therapy today.  Treatment took place in water 3.5-4.75 ft in depth at the [facility] pool. Temp of water was 91.  Pt entered/exited the pool via stairs independently with bilat rail.  - UE on barbell walking forward/ backward with cues for increased R step height and length- 6 lengths;  - Side stepping with yellow hand floats for horz shld add/abd 6 lengths.,  - UE on wall: leg swings into hip flexion/ extension x 15 x RT only, LT too painful; alternating single leg clams x 10 each; hip circles CW/ CCW x 10 each LE, hip abdct/add 15x - marching backward/forward with row motion with rainbow hand floats -> suitcase carry  with bil / single rainbow hand float under water at side - RTLE hamstring stretch on 2nd step 3x 20 sec, verbal instructional cues -Rt knee flexion for mainly quad stretch/floss 20x, cannot do without some mild hip flexion  - 08/13/24 Reassessment (see above) Supine figure four (push knee away) and piriformis stretch (pull knee across) 3 x20 sec each bil Supine lower trunk rotation 3x20 bil Supine hamstring and adductor stretch with strap 2x20 each Sit to stand with hands on thighs x5 with focus on body mechanics  08/09/24 NuStep L4 x 5' PT present to discuss status 3-way lumbar stretch 5x5 each way Doorway Lt QL stretch 2x20 Supine figure four (push knee away) and piriformis stretch (pull knee across) 3 x20 sec each bil Supine lower trunk rotation 3x20 bil Supine hamstring and adductor stretch with strap 2x20 each Sit to stand chair + pad hands on thigh  - PT cued body mechanics for hip hinge and TA indraw - much less pressure in back with this Seated march alt LE x20 Step ups with bil rail lead with each LE x8 reps - PT cued shifting  body weight into Rt LE on step before stepping up to avoid overuse of bil UE Manual therapy: Lt QL and lumbar paraspinal broadening in Rt SL   PATIENT EDUCATION:  Education details: [email or web address] Person educated: Patient Education method: Explanation Education comprehension: verbalized understanding  HOME EXERCISE PROGRAM: Access Code: [email or web address] URL: [email or web address] Date: 08/13/2024 Prepared by: Orvil Beuhring  Exercises - Hooklying Single Knee to Chest Stretch  - 1 x daily - 7 x weekly - 3 sets - 10 reps - Supine Posterior Pelvic Tilt  - 1 x daily - 7 x weekly - 3 sets - 10 reps - Supine Figure 4 Piriformis Stretch  - 1 x daily - 7 x weekly - 3 sets - 10 reps - Supine Piriformis Stretch with Foot on Ground  - 1 x daily - 7 x weekly - 3 sets - 10 reps - Sidelying Transversus Abdominis Bracing  - 1 x daily - 7 x weekly - 1  sets - 10 reps - 5 hold - Supine Hamstring Stretch with Strap  - 1 x daily - 7 x weekly - 2 sets - 2 reps - 30 hold - Modified Thomas Stretch  - 1 x daily - 7 x weekly - 1 sets - 2 reps - 30 hold - Sit to Stand  - 2 x daily - 7 x weekly - 1 sets - 5 reps  ASSESSMENT:  CLINICAL IMPRESSION:  Mikaella presents with increased left leg pain today.  She reported gradual reduction of pain in LLE to 4/10 with suspension on noodle in deeper water and in seated position in 36ft8 depth.  Discussed with pt about gaining access to community pool as she reports relief that lasts 2 days.  Will finalize an Aquatic HEP next session in preparation of d/c.   From initial evaluation:  Patient is a 53 y.o. female with complex medical history who was seen today for physical therapy evaluation and treatment for chronic global back pain and Rt LE pain and weakness.  She had two falls on the same day in 2023 and was discovered to have had a stroke.  She is on disability and is in temporary housing arrangements.  Assessment today reveals significant restriction in Rt hemipelvis, hip and knee related to some hypertonicity/spasticity in Rt LE.  Pt has very restricted Rt>Lt SI joint mobility.  She performs transfers and ambulates slowly secondary to significant pain which she rates as 9-10/10.  Her times tests for 5xSTS, TUG, and support that she is at high risk for falls.  Her LEFS score is 19/80 which is signif disability.  She is scheduled to get repeat injections into bil SI joints on 06/04/24 as these gave her several months of relief last year.  Pt has had aquatic PT in the past which was very helpful. We discussed a hybrid plan of land-based and aquatic-based PT with more efforts to get into aquatic PT schedule on the front end of PT given her pain levels and functional mobility restrictions.  Pt will benefit from skilled PT to maximize her functional strength, mobility, gait and safety, along with educate her on aquatic  and land based HEP to support gains made in PT.  OBJECTIVE IMPAIRMENTS: Abnormal gait, decreased activity tolerance, decreased coordination, decreased endurance, decreased mobility, difficulty walking, decreased ROM, decreased strength, hypomobility, increased muscle spasms, impaired flexibility, impaired sensation, impaired tone, improper body mechanics, postural dysfunction, and pain.   ACTIVITY LIMITATIONS: carrying, lifting, bending, sitting, standing, squatting, sleeping, transfers, bed mobility, bathing,  dressing, hygiene/grooming, and locomotion level  PARTICIPATION LIMITATIONS: cleaning, laundry, driving, shopping, and community activity  PERSONAL FACTORS: 1-2 comorbidities: history of multiple strokes, cardiac history, on disability, time since onset are also affecting patient's functional outcome.   REHAB POTENTIAL: Excellent  CLINICAL DECISION MAKING: Evolving/moderate complexity  EVALUATION COMPLEXITY: Moderate   GOALS: Goals reviewed with patient? Yes  SHORT TERM GOALS: Target date: 06/25/24  Pt will be ind with initial HEP for both land and aquatic environments without exacerbation of pain Baseline: Goal status: MET 07/26/2024  2.  Pt will improve 5x STS to 45 sec or less using proper body mechanics and mod use of UE Baseline:  Goal status: MET 08/02/2024  3.  Pt will improve lumbar ROM to allow greater ease for bending to don shoes/socks Baseline:  Goal status: PARTIALLY MET - INCONSISTENT DEPENDING ON DAY 09/04/24  4.  Pt will report improved pain to 7-8/10 with ADLs Baseline: 9-10/10 Goal status: MET (6-7/10) 08/02/2024  5.  Pt will be able to perform aquatic ambulation for 10 min with brief rests as needed to work on cardiovascular endurance and functional ROM/strength of bil LE Baseline:  Goal status: MET 08/02/2024    LONG TERM GOALS: Target date: 10/16/2024   Pt will be ind with advanced/final HEP for aquatic and land based HEP to maintain gains made in  PT Baseline:  Goal status: IN PROGRESS 09/04/2024  2.  LEFS score to improve by at least 8 points to demo reduced limitations Baseline: 19/80 Goal status: MET 08/13/24  3.  TUG test to improve to 22 sec or less to demo improved functional transfer and gait safety and mobility. Baseline: 27.29 Goal status: MET 08/02/2024  4.  Pt will improve Rt quad and hip flexor mobility to allow for full stride length of Lt LE for more efficient smooth gait Baseline:  Goal status: MET 08/13/24  5.  Pt will perform in 25 sec or less to demo improved gait speed for community access. Baseline:  Goal status: MET 08/02/2024  6.  Pt will achieve at least 4/5 Rt LE strength to allow for improved stairs to access housing options with stairs. Baseline:  Goal status: IN PROGRESS 08/13/2024   7.  TUG test to improve to 12sec or less to demo improved functional transfer and gait safety and mobility.  Baseline 15.57  Goal status: ONGOING 09/04/24    PLAN:  PT FREQUENCY: 2x/week   PT DURATION: 4 weeks  PLANNED INTERVENTIONS: 97110-Therapeutic exercises, 97530- Therapeutic activity, 97112- Neuromuscular re-education, 97535- Self Care, [id number]- Manual therapy, [phone]- Gait training, [phone]- Aquatic Therapy, [phone]- Electrical stimulation (unattended), [phone]- Traction (mechanical), [phone] (1-2 muscles), [phone] (3+ muscles)- Dry Needling, Patient/Family education, Balance training, Stair training, Taping, Joint mobilization, Spinal mobilization, DME instructions, Cryotherapy, and Moist heat.  PLAN FOR NEXT SESSION: aquatic: walking for endurance and LE mobility, Rt LE mobility into hip ext, Rt knee flexion to stretch quad and hip flexor, hamstring stretching, Rt hip ER (Rt hemipelvis is IR with in-toeing - has some spasticity), land: review HEP, try gentle stretching of Rt hip toward ER, ext, knee flexion for anterior thigh and hip stretching, gentle spine ROM, manual therapy, sit to stand; plan to d/c at end of  POC  Delon Aquas, PTA 09/07/24 10:05 AM [facility] [address] [place], [place], [address] Phone: [phone]   Fax:  [phone]

## 2024-09-09 ENCOUNTER — Encounter

## 2024-09-10 ENCOUNTER — Encounter (HOSPITAL_BASED_OUTPATIENT_CLINIC_OR_DEPARTMENT_OTHER): Payer: Self-pay | Admitting: Physical Therapy

## 2024-09-10 ENCOUNTER — Encounter

## 2024-09-10 ENCOUNTER — Ambulatory Visit (HOSPITAL_BASED_OUTPATIENT_CLINIC_OR_DEPARTMENT_OTHER): Admitting: Physical Therapy

## 2024-09-10 DIAGNOSIS — R2681 Unsteadiness on feet: Secondary | ICD-10-CM

## 2024-09-10 DIAGNOSIS — M533 Sacrococcygeal disorders, not elsewhere classified: Secondary | ICD-10-CM | POA: Diagnosis not present

## 2024-09-10 DIAGNOSIS — M6281 Muscle weakness (generalized): Secondary | ICD-10-CM

## 2024-09-10 DIAGNOSIS — M25551 Pain in right hip: Secondary | ICD-10-CM

## 2024-09-10 NOTE — Therapy (Signed)
 OUTPATIENT PHYSICAL THERAPY THORACOLUMBAR TREATMENT   Patient Name: Megan Chaney MRN: [id number] DOB:04-Jul-1971, 53 y.o., female Today's Date: 09/10/2024  END OF SESSION:  PT End of Session - 09/10/24 1113     Visit Number 12    Date for Recertification  09/13/24    Authorization Type [employer] Approved 12vl, 08/29/2024 - 11/26/2024 auth# [id number]    Authorization Time Period 08/29/2024 - 11/26/2024    Authorization - Visit Number 4    Authorization - Number of Visits 12    Progress Note Due on Visit 18    PT Start Time 1100    PT Stop Time 1140    PT Time Calculation (min) 40 min    Behavior During Therapy [facility] for tasks assessed/performed         Past Medical History:  Diagnosis Date   Acute deep vein thrombosis (DVT) of right upper extremity (HCC) 11/03/2021   Anemia    Fibroid    GERD (gastroesophageal reflux disease)    Past Surgical History:  Procedure Laterality Date   BUBBLE STUDY  10/30/2021   Procedure: BUBBLE STUDY;  Surgeon: Delford Maude BROCKS, MD;  Location: [facility] ENDOSCOPY;  Service: Cardiovascular;;   LOOP RECORDER INSERTION N/A 10/30/2021   Procedure: LOOP RECORDER INSERTION;  Surgeon: Cindie Ole DASEN, MD;  Location: [facility] INVASIVE CV LAB;  Service: Cardiovascular;  Laterality: N/A;   MULTIPLE TOOTH EXTRACTIONS     NO PAST SURGERIES     TEE WITHOUT CARDIOVERSION N/A 10/30/2021   Procedure: TRANSESOPHAGEAL ECHOCARDIOGRAM (TEE);  Surgeon: Delford Maude BROCKS, MD;  Location: [facility] ENDOSCOPY;  Service: Cardiovascular;  Laterality: N/A;   Patient Active Problem List   Diagnosis Date Noted   Menorrhagia 08/24/2024   Vagina bleeding 08/24/2024   Anxiety 07/08/2024   Hemiplegia of dominant side as late effect of cerebrovascular disease (HCC) 07/08/2024   AVM (arteriovenous malformation) 07/18/2023   Myofascial pain dysfunction syndrome 02/15/2023   Patent foramen ovale 12/17/2022   IUD (intrauterine device) in place 05/06/2022   Hyperlipidemia 11/03/2021   Right sided weakness  11/03/2021   Symptomatic anemia 11/03/2021   History of CVA (cerebrovascular accident) 10/26/2021   Infertility counseling 10/04/2019   Enlarged uterus 07/31/2018   Uterine leiomyoma 07/31/2018   DUB (dysfunctional uterine bleeding) 07/31/2018    PCP: Leita Blind, MD  REFERRING PROVIDER: Catherene Toribio Pac, MD  REFERRING DIAG: M53.3 (ICD-10-CM) - Sacrococcygeal disorders, not elsewhere classified  Rationale for Evaluation and Treatment: Rehabilitation  THERAPY DIAG:  Sacrococcygeal disorders, not elsewhere classified  Pain in right hip  Muscle weakness (generalized)  Unsteadiness on feet  ONSET DATE: chronic  SUBJECTIVE:  SUBJECTIVE STATEMENT: The pool is where it's at.  Pt reports good relief after last session.  She plans to call/stop by [facility] to inquire about pool membership.     POOL ACCESS: currently none.   From initial evaluation:  Pt referred to OPPT for aquatic therapy with option to transition to land-based therapy as tolerated.  Pt fell two times on the same day in 2023 and was discovered to have sustained a stroke that day. History of multiple strokes and has Rt > Lt chronic global back pain (head to low back) which she attributes to the falls, sacroiliac pain bil and Rt hip pain.  Has done extensive aquatic PT in past which really helped.  Injections in 2024 helped for about 6 mos.  Will be getting repeat SI injections 06/04/24.  Is on disability. Pt aims to walk 15 min 2-4x/week.  PERTINENT HISTORY:  significant history of multiple strokes - has undergone extensive PT for stroke rehabilitation and low back pain management including aquatic PT, which has provided some relief  10/27/21 Multifocal acute/subacute nonhemorrhagic infarcts involving the left MCA  territory.  Previous injections:  September 2024 B/L SI joint injection - provided relief for 3-6 mos, will be scheduling repeat injections  June 2024 Rt greater trochanteric bursa  currently taking Lyrica  and Baclofen for neuropathic pain History of cardiac cath  Has loop recorder  PAIN:  PAIN:  Are you having pain? Yes NPRS scale:5-6/10  Pain location:  Lt buttock into Lt LE to toes PAIN TYPE: throbbing Pain description: intermittent  Aggravating factors: laying down, bending Relieving factors: medications, sleep   PRECAUTIONS: None  RED FLAGS: None  WEIGHT BEARING RESTRICTIONS: No  FALLS:  Has patient fallen in last 6 months? No but has a fear of falling  LIVING ENVIRONMENT: Lives with: lives with roommates - is looking for housing Lives in: House/apartment Stairs: No Has following equipment at home: Single point cane but isn't using it lately - does better without it  OCCUPATION: disabled  PLOF: Independent with basic ADLs, Independent with household mobility without device, Independent with community mobility without device, and Independent with transfers  PATIENT GOALS: learn an aquatic and land based HEP to help manage pain and become less dependent on medications/injections  NEXT MD VISIT: 06/04/24 for bil SI injections  OBJECTIVE:  Note: Objective measures were completed at Evaluation unless otherwise noted.  DIAGNOSTIC FINDINGS:  MRI LUMBAR SPINE WITHOUT CONTRAST  L4-5: Moderate central narrowing of the thecal sac along with mild left and borderline right subarticular lateral recess stenosis due to central disc protrusion, disc bulge, and short pedicles.  PATIENT SURVEYS:  LEFS  Extreme difficulty/unable (0), Quite a bit of difficulty (1), Moderate difficulty (2), Little difficulty (3), No difficulty (4) Survey date:  05/28/24 08/13/24  Any of your usual work, housework or school activities 3 3  2. Usual hobbies, recreational or sporting activities 1  1  3. Getting into/out of the bath 2 3  4. Walking between rooms 2 2  5. Putting on socks/shoes 1 3  6. Squatting  1 2  7. Lifting an object, like a bag of groceries from the floor 1 2  8. Performing light activities around your home 2 4  9. Performing heavy activities around your home 1 1  10. Getting into/out of a car 1 2  11. Walking 2 blocks 1 3  12. Walking 1 mile 0 0  13. Going up/down 10 stairs (1 flight) 0 2  14. Standing for  1 hour 0 0  15.  sitting for 1 hour 2 2  16. Running on even ground 0 0  17. Running on uneven ground 0 0  18. Making sharp turns while running fast 0 0  19. Hopping  0 0  20. Rolling over in bed 1 3  Score total:  19/80 32/80     08/02/2024 LEFS 21/80 26.3%  08/13/24 LEFS 32/80 = 40%  COGNITION: Overall cognitive status: Within functional limits for tasks assessed     SENSATION: Intermittent shooting pain with numbness into toes  MUSCLE LENGTH: 08/13/24: Hamstrings: Right 70 with mild spasticity deg; Left 80 deg Thomas test: negative bil - much improved hip flexor and quad length Right gluteals, piriformis symmetrical bil   Eval: Hamstrings: Right 60 with mild spasticity deg; Left 70 deg Thomas test: Right very tight can't get to 0 deg - flexed to 15 deg Right gluteals, piriformis limited 50% compared to [employer] and hip flexor on Rt signif limited  POSTURE: Rt pelvic inflare with Rt LE IR with in-toeing, some spasticity contributing  PALPATION: 08/13/24 Ongoing tightness and tenderness in Lt hamstring distally behind knee, Lt QL, mild tenderness over bil SI joints  Eval: Signif TTP Rt>Lt SI joint Signif tension/hypertonicity in Rt adductors, hamstrings, proximal quads/hip flexors, hip internal rotators Significant tension and spasm present in trunk paraspinals thoracic to lumbar bil Hypomobile Rt>Lt SI joints with pain  LUMBAR ROM:   AROM eval 08/13/24  Flexion Braces hands on thighs and walks hands down to mid-thigh without  lumbar reversal, signif pain Braces hands on thighs, lumbar flexion to hands at mid-thighs  Extension NT   Right lateral flexion 75% limitated Achieves 75%  Left lateral flexion 75% limitated Achieves 75%  Right rotation 75% limitated Achieves 50%  Left rotation 75% limitated Achieves 50%   (Blank rows = not tested)  LOWER EXTREMITY ROM:    08/13/24: Bil hip ER 50 deg Rt hip ext to 10 deg before hip flexor stretch   Eval: Rt hip ER 35 deg Rt hip ext to neutral - very tight hip flexors and quads   LOWER EXTREMITY MMT:    MMT Right eval Left eval Right 08/13/24 Left 10/20(25  Hip flexion 3+ 4+ 3+ 4+  Hip extension 3+ 4 4- 4  Hip abduction 3+ 4 4- 4+  Hip adduction 3+ 4 4 4+  Hip internal rotation 4- 4 4 4+  Hip external rotation 3+ 4 4- 4+  Knee flexion 3+ 4 4+ 4  Knee extension 4- 4 4+ 4+  Ankle dorsiflexion 4 4 4+ 4+  Ankle plantarflexion 4 4 4 4   Ankle inversion      Ankle eversion       (Blank rows = not tested)  LUMBAR SPECIAL TESTS:  SI Compression/distraction test: Positive and FABER test: Positive  FUNCTIONAL TESTS:  Eval: 5 times sit to stand: 1:01.07 signif use of hands on chair, avoids hip hinge due to pain Timed up and go (TUG): 27.29 no AD : 32.28 sec no AD  08/02/2024 5STS:28.45 sec (unilateral hand support) TUG:14.74 sec no AD walk test: 15.69 sec  08/13/24 5STS: 27.33 sec (unilateral hand support) TUG: 15.57 sec no AD walk test: 12.63  09/04/2024 5STS: 30.50sec (bilateral UE support) TUG:15. 86 sec no AD  GAIT: Distance walked: 30 feet Assistive device utilized: None Level of assistance: Modified independence Comments: Rt LE IR with in-toeing, short stance time on Rt, small stride length bil, lacks trunk rotation  TREATMENT DATE:  09/10/24:Pt seen for aquatic therapy today.  Treatment took place in water 3.5-4.75 ft in depth at the [facility] pool. Temp of water was 91.  Pt entered/exited the pool via  stairs independently with bilat rail.  - UE on barbell walking forward/ backward with cues for increased R step height and length - 3 laps; Side stepping 3 laps with arm horz abdct/ add with rainbow hand floatsx` - UE On wall: heel raises x 8 (increased pain in BLE); relaxed squat; bil clams in relaxed squat x 10 - noodle under arms, UE on corner:  circuit of cycling, hip abdct /add, hip flexion/extension suspended - in 10ft: walking forward/ backward with reciprocal arm swing - seated on yellow noodle- stool scoots backward and forward 3 laps (good tolerance), sideways with gentle bounces; arm horiz abdct/ add under the water with core engaged - plank with hands on bench with gentle alternating hip extension  09/07/24:Pt seen for aquatic therapy today.  Treatment took place in water 3.5-4.75 ft in depth at the [facility] pool. Temp of water was 91.  Pt entered/exited the pool via stairs independently with bilat rail.  - UE on barbell walking forward/ backward with cues for increased R step height and length - 3 laps; Side stepping 2 laps - UE On wall: heel raises x 8 (increased pain in BLE); relaxed squat; bil clams in relaxed squat x 10 - noodle under arms, UE on corner:  circuit of cycling, hip abdct /add, hip flexion/extension suspended - in 78ft: walking forward/ backward with reciprocal arm swing - seated on yellow noodle- stool scoots backward and forward 3 laps (good tolerance), sideways with gentle bounces; arm horiz abdct/ add under the water with core engaged - plank with hands on bench with gentle alternating hip extension   09/04/24 NuStep L3 x 5' PT present to discuss status 5STS: 30.50sec (bilateral UE support) TUG:15. 86 sec no AD Discussion of POC and plan once therapy is over Supine hamstring and adductor stretch with strap 2x20 each Supine lower trunk rotation 3x20 bil Supine PPT x 20 PPT + bent knee fall out x 10 bilateral  Supine bridge x 10 Seated  march x 20 total  Manual: STM with addaday for decreased pain and improved muscle elongation.     [facility] Adult PT Treatment:                                              08/31/24:Pt seen for aquatic therapy today.  Treatment took place in water 3.5-4.75 ft in depth at the [facility] pool. Temp of water was 91.  Pt entered/exited the pool via stairs independently with bilat rail.  - UE on barbell walking forward/ backward with cues for increased R step height and length- 6 lengths;  - Side stepping with yellow hand floats for horz shld add/abd 6 lengths.,  - UE on wall: leg swings into hip flexion/ extension x 15 x RT only, LT too painful; alternating single leg clams x 10 each; hip circles CW/ CCW x 10 each LE, hip abdct/add 15x - marching backward/forward with row motion with rainbow hand floats -> suitcase carry with bil / single rainbow hand float under water at side - RTLE hamstring stretch on 2nd step 3x 20 sec, verbal instructional cues -Rt knee flexion for mainly quad stretch/floss 20x, cannot do without  some mild hip flexion  - 08/13/24 Reassessment (see above) Supine figure four (push knee away) and piriformis stretch (pull knee across) 3 x20 sec each bil Supine lower trunk rotation 3x20 bil Supine hamstring and adductor stretch with strap 2x20 each Sit to stand with hands on thighs x5 with focus on body mechanics  08/09/24 NuStep L4 x 5' PT present to discuss status 3-way lumbar stretch 5x5 each way Doorway Lt QL stretch 2x20 Supine figure four (push knee away) and piriformis stretch (pull knee across) 3 x20 sec each bil Supine lower trunk rotation 3x20 bil Supine hamstring and adductor stretch with strap 2x20 each Sit to stand chair + pad hands on thigh  - PT cued body mechanics for hip hinge and TA indraw - much less pressure in back with this Seated march alt LE x20 Step ups with bil rail lead with each LE x8 reps - PT cued shifting body weight into Rt LE on  step before stepping up to avoid overuse of bil UE Manual therapy: Lt QL and lumbar paraspinal broadening in Rt SL   PATIENT EDUCATION:  Education details: [email or web address] Person educated: Patient Education method: Explanation Education comprehension: verbalized understanding  HOME EXERCISE PROGRAM: Access Code: [email or web address] URL: [email or web address] Date: 08/13/2024 Prepared by: Orvil Beuhring  Exercises - Hooklying Single Knee to Chest Stretch  - 1 x daily - 7 x weekly - 3 sets - 10 reps - Supine Posterior Pelvic Tilt  - 1 x daily - 7 x weekly - 3 sets - 10 reps - Supine Figure 4 Piriformis Stretch  - 1 x daily - 7 x weekly - 3 sets - 10 reps - Supine Piriformis Stretch with Foot on Ground  - 1 x daily - 7 x weekly - 3 sets - 10 reps - Sidelying Transversus Abdominis Bracing  - 1 x daily - 7 x weekly - 1 sets - 10 reps - 5 hold - Supine Hamstring Stretch with Strap  - 1 x daily - 7 x weekly - 2 sets - 2 reps - 30 hold - Modified Thomas Stretch  - 1 x daily - 7 x weekly - 1 sets - 2 reps - 30 hold - Sit to Stand  - 2 x daily - 7 x weekly - 1 sets - 5 reps  AQUATIC Access Code: [email or web address] URL: [email or web address] Date: 09/10/2024  Prepared by: [facility] [address] This aquatic home exercise program from Med[employer] utilizes pictures from land based exercises, but has been adapted prior to lamination and issuance.    ASSESSMENT:  CLINICAL IMPRESSION:  Megan Chaney presents with continued pain in left leg.  She reported gradual reduction of pain in LLE to 4/10 with suspension on noodle in deeper water and in seated position in 68ft8 depth.  Discussed with pt about gaining access to community pool as she reports relief that lasts 2 days. Finalized and issued Aquatic HEP in preparation of d/c.   From initial evaluation:  Patient is a 53 y.o. female with complex medical history who was seen today for physical therapy evaluation and treatment for  chronic global back pain and Rt LE pain and weakness.  She had two falls on the same day in 2023 and was discovered to have had a stroke.  She is on disability and is in temporary housing arrangements.  Assessment today reveals significant restriction in Rt hemipelvis, hip and knee related to some hypertonicity/spasticity in Rt LE.  Pt has very restricted Rt>Lt SI joint mobility.  She performs transfers and ambulates slowly secondary to significant pain which she rates as 9-10/10.  Her times tests for 5xSTS, TUG, and support that she is at high risk for falls.  Her LEFS score is 19/80 which is signif disability.  She is scheduled to get repeat injections into bil SI joints on 06/04/24 as these gave her several months of relief last year.  Pt has had aquatic PT in the past which was very helpful. We discussed a hybrid plan of land-based and aquatic-based PT with more efforts to get into aquatic PT schedule on the front end of PT given her pain levels and functional mobility restrictions.  Pt will benefit from skilled PT to maximize her functional strength, mobility, gait and safety, along with educate her on aquatic and land based HEP to support gains made in PT.  OBJECTIVE IMPAIRMENTS: Abnormal gait, decreased activity tolerance, decreased coordination, decreased endurance, decreased mobility, difficulty walking, decreased ROM, decreased strength, hypomobility, increased muscle spasms, impaired flexibility, impaired sensation, impaired tone, improper body mechanics, postural dysfunction, and pain.   ACTIVITY LIMITATIONS: carrying, lifting, bending, sitting, standing, squatting, sleeping, transfers, bed mobility, bathing, dressing, hygiene/grooming, and locomotion level  PARTICIPATION LIMITATIONS: cleaning, laundry, driving, shopping, and community activity  PERSONAL FACTORS: 1-2 comorbidities: history of multiple strokes, cardiac history, on disability, time since onset are also affecting patient's  functional outcome.   REHAB POTENTIAL: Excellent  CLINICAL DECISION MAKING: Evolving/moderate complexity  EVALUATION COMPLEXITY: Moderate   GOALS: Goals reviewed with patient? Yes  SHORT TERM GOALS: Target date: 06/25/24  Pt will be ind with initial HEP for both land and aquatic environments without exacerbation of pain Baseline: Goal status: MET 07/26/2024  2.  Pt will improve 5x STS to 45 sec or less using proper body mechanics and mod use of UE Baseline:  Goal status: MET 08/02/2024  3.  Pt will improve lumbar ROM to allow greater ease for bending to don shoes/socks Baseline:  Goal status: PARTIALLY MET - INCONSISTENT DEPENDING ON DAY 09/04/24  4.  Pt will report improved pain to 7-8/10 with ADLs Baseline: 9-10/10 Goal status: MET (6-7/10) 08/02/2024  5.  Pt will be able to perform aquatic ambulation for 10 min with brief rests as needed to work on cardiovascular endurance and functional ROM/strength of bil LE Baseline:  Goal status: MET 08/02/2024    LONG TERM GOALS: Target date: 10/16/2024   Pt will be ind with advanced/final HEP for aquatic and land based HEP to maintain gains made in PT Baseline:  Goal status: Partially met - 09/10/24  2.  LEFS score to improve by at least 8 points to demo reduced limitations Baseline: 19/80 Goal status: MET 08/13/24  3.  TUG test to improve to 22 sec or less to demo improved functional transfer and gait safety and mobility. Baseline: 27.29 Goal status: MET 08/02/2024  4.  Pt will improve Rt quad and hip flexor mobility to allow for full stride length of Lt LE for more efficient smooth gait Baseline:  Goal status: MET 08/13/24  5.  Pt will perform in 25 sec or less to demo improved gait speed for community access. Baseline:  Goal status: MET 08/02/2024  6.  Pt will achieve at least 4/5 Rt LE strength to allow for improved stairs to access housing options with stairs. Baseline:  Goal status: IN PROGRESS  08/13/2024   7.  TUG test to improve to 12sec or less to demo improved functional transfer and gait safety and mobility.  Baseline 15.57  Goal status: ONGOING 09/04/24    PLAN:  PT FREQUENCY: 2x/week   PT DURATION: 4 weeks  PLANNED INTERVENTIONS: 97110-Therapeutic exercises, 97530- Therapeutic activity, 97112- Neuromuscular re-education, 97535- Self Care, [id number]- Manual therapy, [phone]- Gait training, [phone]- Aquatic Therapy, [phone]- Electrical stimulation (unattended), [phone]- Traction (mechanical), [phone] (1-2 muscles), [phone] (3+ muscles)- Dry Needling, Patient/Family education, Balance training, Stair training, Taping, Joint mobilization, Spinal mobilization, DME instructions, Cryotherapy, and Moist heat.  PLAN FOR NEXT SESSION: aquatic: walking for endurance and LE mobility, Rt LE mobility into hip ext, Rt knee flexion to stretch quad and hip flexor, hamstring stretching, Rt hip ER (Rt hemipelvis is IR with in-toeing - has some spasticity), land: review HEP, try gentle stretching of Rt hip toward ER, ext, knee flexion for anterior thigh and hip stretching, gentle spine ROM, manual therapy, sit to stand; plan to d/c at end of POC   Delon Aquas, PTA 09/10/24 3:13 PM [facility] [address] [place], [place], [address] Phone: [phone]   Fax:  [phone]

## 2024-09-11 LAB — GENECONNECT MOLECULAR SCREEN: Genetic Analysis Overall Interpretation: NEGATIVE

## 2024-09-12 ENCOUNTER — Ambulatory Visit (INDEPENDENT_AMBULATORY_CARE_PROVIDER_SITE_OTHER)

## 2024-09-12 ENCOUNTER — Ambulatory Visit: Attending: Cardiology

## 2024-09-12 VITALS — BP 106/68 | HR 94 | Temp 98.3°F | Resp 14 | Ht 63.0 in | Wt 144.2 lb

## 2024-09-12 DIAGNOSIS — D509 Iron deficiency anemia, unspecified: Secondary | ICD-10-CM

## 2024-09-12 DIAGNOSIS — N939 Abnormal uterine and vaginal bleeding, unspecified: Secondary | ICD-10-CM | POA: Diagnosis not present

## 2024-09-12 DIAGNOSIS — N920 Excessive and frequent menstruation with regular cycle: Secondary | ICD-10-CM | POA: Diagnosis not present

## 2024-09-12 DIAGNOSIS — I639 Cerebral infarction, unspecified: Secondary | ICD-10-CM

## 2024-09-12 DIAGNOSIS — D649 Anemia, unspecified: Secondary | ICD-10-CM

## 2024-09-12 LAB — CUP PACEART REMOTE DEVICE CHECK
Date Time Interrogation Session: 20251118233043
Implantable Pulse Generator Implant Date: 20230106

## 2024-09-12 MED ORDER — SODIUM CHLORIDE 0.9 % IV SOLN
510.0000 mg | Freq: Once | INTRAVENOUS | Status: AC
  Administered 2024-09-12: 510 mg via INTRAVENOUS
  Filled 2024-09-12: qty 17

## 2024-09-12 NOTE — Progress Notes (Signed)
 Diagnosis: Iron  Deficiency Anemia  Provider:  Praveen Mannam MD  Procedure: IV Infusion  IV Type: Peripheral, IV Location: L Antecubital  Feraheme  (Ferumoxytol ), Dose: 510 mg  Infusion Start Time: 1120  Infusion Stop Time: 1138  Post Infusion IV Care: Observation period completed and Peripheral IV Discontinued  Discharge: Condition: Good, Destination: Home . AVS Provided  Performed by:  Maximiano JONELLE Pouch, LPN

## 2024-09-13 ENCOUNTER — Ambulatory Visit: Admitting: Physical Therapy

## 2024-09-13 ENCOUNTER — Encounter: Payer: Self-pay | Admitting: Physical Therapy

## 2024-09-13 ENCOUNTER — Ambulatory Visit: Payer: Self-pay | Admitting: Cardiology

## 2024-09-14 NOTE — Progress Notes (Signed)
 Remote Loop Recorder Transmission

## 2024-09-17 NOTE — Telephone Encounter (Signed)
 Hi team, please assist with scheduling   Labs/md visit 2 mo  Electronically signed by: Bernardino Ill, MD 09/17/2024 12:41 PM

## 2024-09-19 ENCOUNTER — Ambulatory Visit (INDEPENDENT_AMBULATORY_CARE_PROVIDER_SITE_OTHER)

## 2024-09-19 VITALS — BP 110/76 | HR 79 | Temp 98.5°F | Resp 16 | Ht 63.0 in | Wt 146.8 lb

## 2024-09-19 DIAGNOSIS — N939 Abnormal uterine and vaginal bleeding, unspecified: Secondary | ICD-10-CM

## 2024-09-19 DIAGNOSIS — N920 Excessive and frequent menstruation with regular cycle: Secondary | ICD-10-CM

## 2024-09-19 DIAGNOSIS — D649 Anemia, unspecified: Secondary | ICD-10-CM

## 2024-09-19 MED ORDER — SODIUM CHLORIDE 0.9 % IV SOLN
510.0000 mg | Freq: Once | INTRAVENOUS | Status: AC
  Administered 2024-09-19: 510 mg via INTRAVENOUS
  Filled 2024-09-19: qty 17

## 2024-09-19 NOTE — Progress Notes (Signed)
 Diagnosis: Acute Anemia  Provider:  Mannam, Praveen MD  Procedure: IV Infusion  IV Type: Peripheral, IV Location: L Antecubital   Feraheme  (Ferumoxytol ), Dose: 510 mg  Infusion Start Time: 1112  Infusion Stop Time: 1131  Post Infusion IV Care: Observation period completed  Discharge: Condition: Good, Destination: Home . AVS Provided  Performed by:  Eleanor DELENA Bloch, RN

## 2024-09-19 NOTE — Patient Instructions (Signed)
 SABRA

## 2024-10-01 ENCOUNTER — Encounter

## 2024-10-10 ENCOUNTER — Encounter

## 2024-10-11 ENCOUNTER — Encounter: Payer: Self-pay | Admitting: Physical Therapy

## 2024-10-11 ENCOUNTER — Ambulatory Visit: Payer: Self-pay | Attending: Pain Medicine | Admitting: Physical Therapy

## 2024-10-11 DIAGNOSIS — M533 Sacrococcygeal disorders, not elsewhere classified: Secondary | ICD-10-CM | POA: Insufficient documentation

## 2024-10-11 DIAGNOSIS — M25551 Pain in right hip: Secondary | ICD-10-CM | POA: Diagnosis present

## 2024-10-11 DIAGNOSIS — M6281 Muscle weakness (generalized): Secondary | ICD-10-CM | POA: Diagnosis present

## 2024-10-11 DIAGNOSIS — R2681 Unsteadiness on feet: Secondary | ICD-10-CM | POA: Diagnosis present

## 2024-10-11 NOTE — Therapy (Signed)
 OUTPATIENT PHYSICAL THERAPY THORACOLUMBAR TREATMENT / DISCHARGE NOTE  Patient Name: Megan Chaney MRN: [id number] DOB:1971-09-25, 53 y.o., female Today's Date: 10/11/2024  END OF SESSION:  PT End of Session - 10/11/24 1709     Visit Number 13    Date for Recertification  10/06/24    Authorization Type [employer] Approved 12vl, 08/29/2024 - 11/26/2024 auth# [id number]    Authorization Time Period 08/29/2024 - 11/26/2024    Authorization - Visit Number 5    Authorization - Number of Visits 12    PT Start Time 1531    PT Stop Time 1602    PT Time Calculation (min) 31 min    Activity Tolerance Patient tolerated treatment well    Behavior During Therapy [facility] for tasks assessed/performed          Past Medical History:  Diagnosis Date   Acute deep vein thrombosis (DVT) of right upper extremity (HCC) 11/03/2021   Anemia    Fibroid    GERD (gastroesophageal reflux disease)    Past Surgical History:  Procedure Laterality Date   BUBBLE STUDY  10/30/2021   Procedure: BUBBLE STUDY;  Surgeon: Delford Maude BROCKS, MD;  Location: [facility] ENDOSCOPY;  Service: Cardiovascular;;   LOOP RECORDER INSERTION N/A 10/30/2021   Procedure: LOOP RECORDER INSERTION;  Surgeon: Cindie Ole DASEN, MD;  Location: [facility] INVASIVE CV LAB;  Service: Cardiovascular;  Laterality: N/A;   MULTIPLE TOOTH EXTRACTIONS     NO PAST SURGERIES     TEE WITHOUT CARDIOVERSION N/A 10/30/2021   Procedure: TRANSESOPHAGEAL ECHOCARDIOGRAM (TEE);  Surgeon: Delford Maude BROCKS, MD;  Location: [facility] ENDOSCOPY;  Service: Cardiovascular;  Laterality: N/A;   Patient Active Problem List   Diagnosis Date Noted   Menorrhagia 08/24/2024   Vagina bleeding 08/24/2024   Anxiety 07/08/2024   Hemiplegia of dominant side as late effect of cerebrovascular disease (HCC) 07/08/2024   AVM (arteriovenous malformation) 07/18/2023   Myofascial pain dysfunction syndrome 02/15/2023   Patent foramen ovale 12/17/2022   IUD (intrauterine device) in place 05/06/2022    Hyperlipidemia 11/03/2021   Right sided weakness 11/03/2021   Symptomatic anemia 11/03/2021   History of CVA (cerebrovascular accident) 10/26/2021   Infertility counseling 10/04/2019   Enlarged uterus 07/31/2018   Uterine leiomyoma 07/31/2018   DUB (dysfunctional uterine bleeding) 07/31/2018    PCP: Leita Blind, MD  REFERRING PROVIDER: Catherene Toribio Pac, MD  REFERRING DIAG: M53.3 (ICD-10-CM) - Sacrococcygeal disorders, not elsewhere classified  Rationale for Evaluation and Treatment: Rehabilitation  THERAPY DIAG:  Sacrococcygeal disorders, not elsewhere classified  Pain in right hip  Muscle weakness (generalized)  Unsteadiness on feet  ONSET DATE: chronic  SUBJECTIVE:  SUBJECTIVE STATEMENT: Patient reports she is getting a nerve ablation in January. She still has been having pain down her leg. Her upper back feels tight.   POOL ACCESS: currently none.   From initial evaluation:  Pt referred to OPPT for aquatic therapy with option to transition to land-based therapy as tolerated.  Pt fell two times on the same day in 2023 and was discovered to have sustained a stroke that day. History of multiple strokes and has Rt > Lt chronic global back pain (head to low back) which she attributes to the falls, sacroiliac pain bil and Rt hip pain.  Has done extensive aquatic PT in past which really helped.  Injections in 2024 helped for about 6 mos.  Will be getting repeat SI injections 06/04/24.  Is on disability. Pt aims to walk 15 min 2-4x/week.  PERTINENT HISTORY:  significant history of multiple strokes - has undergone extensive PT for stroke rehabilitation and low back pain management including aquatic PT, which has provided some relief  10/27/21 Multifocal acute/subacute nonhemorrhagic infarcts  involving the left MCA territory.  Previous injections:  September 2024 B/L SI joint injection - provided relief for 3-6 mos, will be scheduling repeat injections  June 2024 Rt greater trochanteric bursa  currently taking Lyrica  and Baclofen  for neuropathic pain History of cardiac cath  Has loop recorder  PAIN:  PAIN:  Are you having pain? Yes NPRS scale:5-6/10  Pain location:  Lt buttock into Lt LE to toes PAIN TYPE: throbbing Pain description: intermittent  Aggravating factors: laying down, bending Relieving factors: medications, sleep   PRECAUTIONS: None  RED FLAGS: None  WEIGHT BEARING RESTRICTIONS: No  FALLS:  Has patient fallen in last 6 months? No but has a fear of falling  LIVING ENVIRONMENT: Lives with: lives with roommates - is looking for housing Lives in: House/apartment Stairs: No Has following equipment at home: Single point cane but isn't using it lately - does better without it  OCCUPATION: disabled  PLOF: Independent with basic ADLs, Independent with household mobility without device, Independent with community mobility without device, and Independent with transfers  PATIENT GOALS: learn an aquatic and land based HEP to help manage pain and become less dependent on medications/injections  NEXT MD VISIT: 06/04/24 for bil SI injections  OBJECTIVE:  Note: Objective measures were completed at Evaluation unless otherwise noted.  DIAGNOSTIC FINDINGS:  MRI LUMBAR SPINE WITHOUT CONTRAST  L4-5: Moderate central narrowing of the thecal sac along with mild left and borderline right subarticular lateral recess stenosis due to central disc protrusion, disc bulge, and short pedicles.  PATIENT SURVEYS:  LEFS  Extreme difficulty/unable (0), Quite a bit of difficulty (1), Moderate difficulty (2), Little difficulty (3), No difficulty (4) Survey date:  05/28/24 08/13/24  Any of your usual work, housework or school activities 3 3  2. Usual hobbies, recreational  or sporting activities 1 1  3. Getting into/out of the bath 2 3  4. Walking between rooms 2 2  5. Putting on socks/shoes 1 3  6. Squatting  1 2  7. Lifting an object, like a bag of groceries from the floor 1 2  8. Performing light activities around your home 2 4  9. Performing heavy activities around your home 1 1  10. Getting into/out of a car 1 2  11. Walking 2 blocks 1 3  12. Walking 1 mile 0 0  13. Going up/down 10 stairs (1 flight) 0 2  14. Standing for 1 hour 0 0  15.  sitting for 1 hour 2 2  16. Running on even ground 0 0  17. Running on uneven ground 0 0  18. Making sharp turns while running fast 0 0  19. Hopping  0 0  20. Rolling over in bed 1 3  Score total:  19/80 32/80     08/02/2024 LEFS 21/80 26.3%  08/13/24 LEFS 32/80 = 40%  COGNITION: Overall cognitive status: Within functional limits for tasks assessed     SENSATION: Intermittent shooting pain with numbness into toes  MUSCLE LENGTH: 08/13/24: Hamstrings: Right 70 with mild spasticity deg; Left 80 deg Thomas test: negative bil - much improved hip flexor and quad length Right gluteals, piriformis symmetrical bil   Eval: Hamstrings: Right 60 with mild spasticity deg; Left 70 deg Thomas test: Right very tight can't get to 0 deg - flexed to 15 deg Right gluteals, piriformis limited 50% compared to [employer] and hip flexor on Rt signif limited  POSTURE: Rt pelvic inflare with Rt LE IR with in-toeing, some spasticity contributing  PALPATION: 08/13/24 Ongoing tightness and tenderness in Lt hamstring distally behind knee, Lt QL, mild tenderness over bil SI joints  Eval: Signif TTP Rt>Lt SI joint Signif tension/hypertonicity in Rt adductors, hamstrings, proximal quads/hip flexors, hip internal rotators Significant tension and spasm present in trunk paraspinals thoracic to lumbar bil Hypomobile Rt>Lt SI joints with pain  LUMBAR ROM:   AROM eval 08/13/24  Flexion Braces hands on thighs and walks hands  down to mid-thigh without lumbar reversal, signif pain Braces hands on thighs, lumbar flexion to hands at mid-thighs  Extension NT   Right lateral flexion 75% limitated Achieves 75%  Left lateral flexion 75% limitated Achieves 75%  Right rotation 75% limitated Achieves 50%  Left rotation 75% limitated Achieves 50%   (Blank rows = not tested)  LOWER EXTREMITY ROM:    08/13/24: Bil hip ER 50 deg Rt hip ext to 10 deg before hip flexor stretch   Eval: Rt hip ER 35 deg Rt hip ext to neutral - very tight hip flexors and quads   LOWER EXTREMITY MMT:    MMT Right eval Left eval Right 08/13/24 Left 10/20(25  Hip flexion 3+ 4+ 3+ 4+  Hip extension 3+ 4 4- 4  Hip abduction 3+ 4 4- 4+  Hip adduction 3+ 4 4 4+  Hip internal rotation 4- 4 4 4+  Hip external rotation 3+ 4 4- 4+  Knee flexion 3+ 4 4+ 4  Knee extension 4- 4 4+ 4+  Ankle dorsiflexion 4 4 4+ 4+  Ankle plantarflexion 4 4 4 4   Ankle inversion      Ankle eversion       (Blank rows = not tested)  LUMBAR SPECIAL TESTS:  SI Compression/distraction test: Positive and FABER test: Positive  FUNCTIONAL TESTS:  Eval: 5 times sit to stand: 1:01.07 signif use of hands on chair, avoids hip hinge due to pain Timed up and go (TUG): 27.29 no AD : 32.28 sec no AD  08/02/2024 5STS:28.45 sec (unilateral hand support) TUG:14.74 sec no AD walk test: 15.69 sec  08/13/24 5STS: 27.33 sec (unilateral hand support) TUG: 15.57 sec no AD walk test: 12.63  09/04/2024 5STS: 30.50sec (bilateral UE support) TUG:15. 86 sec no AD  GAIT: Distance walked: 30 feet Assistive device utilized: None Level of assistance: Modified independence Comments: Rt LE IR with in-toeing, short stance time on Rt, small stride length bil, lacks trunk rotation  TREATMENT DATE:  10/11/24  NuStep L3 x 5' PT present to discuss status Update on status. Patient education on stress management and how health concerns and family issues  contribute to pain. Seated shoulder circles x 5  Seated sidebend QL stretch x 3 each side Patient requested to leave appointment early   09/10/24:Pt seen for aquatic therapy today.  Treatment took place in water 3.5-4.75 ft in depth at the [facility] pool. Temp of water was 91.  Pt entered/exited the pool via stairs independently with bilat rail.  - UE on barbell walking forward/ backward with cues for increased R step height and length - 3 laps; Side stepping 3 laps with arm horz abdct/ add with rainbow hand floatsx` - UE On wall: heel raises x 8 (increased pain in BLE); relaxed squat; bil clams in relaxed squat x 10 - noodle under arms, UE on corner:  circuit of cycling, hip abdct /add, hip flexion/extension suspended - in 63ft: walking forward/ backward with reciprocal arm swing - seated on yellow noodle- stool scoots backward and forward 3 laps (good tolerance), sideways with gentle bounces; arm horiz abdct/ add under the water with core engaged - plank with hands on bench with gentle alternating hip extension  09/07/24:Pt seen for aquatic therapy today.  Treatment took place in water 3.5-4.75 ft in depth at the [facility] pool. Temp of water was 91.  Pt entered/exited the pool via stairs independently with bilat rail.  - UE on barbell walking forward/ backward with cues for increased R step height and length - 3 laps; Side stepping 2 laps - UE On wall: heel raises x 8 (increased pain in BLE); relaxed squat; bil clams in relaxed squat x 10 - noodle under arms, UE on corner:  circuit of cycling, hip abdct /add, hip flexion/extension suspended - in 8ft: walking forward/ backward with reciprocal arm swing - seated on yellow noodle- stool scoots backward and forward 3 laps (good tolerance), sideways with gentle bounces; arm horiz abdct/ add under the water with core engaged - plank with hands on bench with gentle alternating hip extension   09/04/24 NuStep L3 x 5'  PT present to discuss status 5STS: 30.50sec (bilateral UE support) TUG:15. 86 sec no AD Discussion of POC and plan once therapy is over Supine hamstring and adductor stretch with strap 2x20 each Supine lower trunk rotation 3x20 bil Supine PPT x 20 PPT + bent knee fall out x 10 bilateral  Supine bridge x 10 Seated march x 20 total  Manual: STM with addaday for decreased pain and improved muscle elongation.     [facility] Adult PT Treatment:                                              08/31/24:Pt seen for aquatic therapy today.  Treatment took place in water 3.5-4.75 ft in depth at the [facility] pool. Temp of water was 91.  Pt entered/exited the pool via stairs independently with bilat rail.  - UE on barbell walking forward/ backward with cues for increased R step height and length- 6 lengths;  - Side stepping with yellow hand floats for horz shld add/abd 6 lengths.,  - UE on wall: leg swings into hip flexion/ extension x 15 x RT only, LT too painful; alternating single leg clams x 10 each; hip circles CW/ CCW x 10 each LE, hip abdct/add 15x - marching  backward/forward with row motion with rainbow hand floats -> suitcase carry with bil / single rainbow hand float under water at side - RTLE hamstring stretch on 2nd step 3x 20 sec, verbal instructional cues -Rt knee flexion for mainly quad stretch/floss 20x, cannot do without some mild hip flexion     PATIENT EDUCATION:  Education details: [email or web address] Person educated: Patient Education method: Explanation Education comprehension: verbalized understanding  HOME EXERCISE PROGRAM: Access Code: [email or web address] URL: [email or web address] Date: 08/13/2024 Prepared by: Orvil Beuhring  Exercises - Hooklying Single Knee to Chest Stretch  - 1 x daily - 7 x weekly - 3 sets - 10 reps - Supine Posterior Pelvic Tilt  - 1 x daily - 7 x weekly - 3 sets - 10 reps - Supine Figure 4 Piriformis Stretch  - 1 x daily - 7 x weekly - 3  sets - 10 reps - Supine Piriformis Stretch with Foot on Ground  - 1 x daily - 7 x weekly - 3 sets - 10 reps - Sidelying Transversus Abdominis Bracing  - 1 x daily - 7 x weekly - 1 sets - 10 reps - 5 hold - Supine Hamstring Stretch with Strap  - 1 x daily - 7 x weekly - 2 sets - 2 reps - 30 hold - Modified Thomas Stretch  - 1 x daily - 7 x weekly - 1 sets - 2 reps - 30 hold - Sit to Stand  - 2 x daily - 7 x weekly - 1 sets - 5 reps  AQUATIC Access Code: [email or web address] URL: [email or web address] Date: 09/10/2024  Prepared by: [facility] [address] This aquatic home exercise program from MedBridge utilizes pictures from land based exercises, but has been adapted prior to lamination and issuance.    ASSESSMENT:  CLINICAL IMPRESSION:  Tanganika continues to verbalized increased leg pain. She is scheduled to get a nerve ablation in January. She has made some progress with therapy, but progress somewhat limited due to patient's health concerns. Majority of treatment session was spent providing patient education of stress management and activity modification. She has had a lot of health concerns happen over a short period of time and she was not aware of how her function would be impacted. Patient will get the needed procedures and return to therapy once stable. Patient to discharge home with HEP.  From initial evaluation:  Patient is a 53 y.o. female with complex medical history who was seen today for physical therapy evaluation and treatment for chronic global back pain and Rt LE pain and weakness.  She had two falls on the same day in 2023 and was discovered to have had a stroke.  She is on disability and is in temporary housing arrangements.  Assessment today reveals significant restriction in Rt hemipelvis, hip and knee related to some hypertonicity/spasticity in Rt LE.  Pt has very restricted Rt>Lt SI joint mobility.  She performs transfers and ambulates slowly secondary  to significant pain which she rates as 9-10/10.  Her times tests for 5xSTS, TUG, and support that she is at high risk for falls.  Her LEFS score is 19/80 which is signif disability.  She is scheduled to get repeat injections into bil SI joints on 06/04/24 as these gave her several months of relief last year.  Pt has had aquatic PT in the past which was very helpful. We discussed a hybrid plan of land-based and aquatic-based PT with more efforts to get into aquatic PT schedule on  the front end of PT given her pain levels and functional mobility restrictions.  Pt will benefit from skilled PT to maximize her functional strength, mobility, gait and safety, along with educate her on aquatic and land based HEP to support gains made in PT.  OBJECTIVE IMPAIRMENTS: Abnormal gait, decreased activity tolerance, decreased coordination, decreased endurance, decreased mobility, difficulty walking, decreased ROM, decreased strength, hypomobility, increased muscle spasms, impaired flexibility, impaired sensation, impaired tone, improper body mechanics, postural dysfunction, and pain.   ACTIVITY LIMITATIONS: carrying, lifting, bending, sitting, standing, squatting, sleeping, transfers, bed mobility, bathing, dressing, hygiene/grooming, and locomotion level  PARTICIPATION LIMITATIONS: cleaning, laundry, driving, shopping, and community activity  PERSONAL FACTORS: 1-2 comorbidities: history of multiple strokes, cardiac history, on disability, time since onset are also affecting patient's functional outcome.   REHAB POTENTIAL: Excellent  CLINICAL DECISION MAKING: Evolving/moderate complexity  EVALUATION COMPLEXITY: Moderate   GOALS: Goals reviewed with patient? Yes  SHORT TERM GOALS: Target date: 06/25/24  Pt will be ind with initial HEP for both land and aquatic environments without exacerbation of pain Baseline: Goal status: MET 07/26/2024  2.  Pt will improve 5x STS to 45 sec or less using proper body  mechanics and mod use of UE Baseline:  Goal status: MET 08/02/2024  3.  Pt will improve lumbar ROM to allow greater ease for bending to don shoes/socks Baseline:  Goal status: PARTIALLY MET - INCONSISTENT DEPENDING ON DAY 09/04/24  4.  Pt will report improved pain to 7-8/10 with ADLs Baseline: 9-10/10 Goal status: MET (6-7/10) 08/02/2024  5.  Pt will be able to perform aquatic ambulation for 10 min with brief rests as needed to work on cardiovascular endurance and functional ROM/strength of bil LE Baseline:  Goal status: MET 08/02/2024    LONG TERM GOALS: Target date: 10/16/2024   Pt will be ind with advanced/final HEP for aquatic and land based HEP to maintain gains made in PT Baseline:  Goal status: Partially met - 09/10/24  2.  LEFS score to improve by at least 8 points to demo reduced limitations Baseline: 19/80 Goal status: MET 08/13/24  3.  TUG test to improve to 22 sec or less to demo improved functional transfer and gait safety and mobility. Baseline: 27.29 Goal status: MET 08/02/2024  4.  Pt will improve Rt quad and hip flexor mobility to allow for full stride length of Lt LE for more efficient smooth gait Baseline:  Goal status: MET 08/13/24  5.  Pt will perform in 25 sec or less to demo improved gait speed for community access. Baseline:  Goal status: MET 08/02/2024  6.  Pt will achieve at least 4/5 Rt LE strength to allow for improved stairs to access housing options with stairs. Baseline:  Goal status: MET    7.  TUG test to improve to 12sec or less to demo improved functional transfer and gait safety and mobility.  Baseline 15.57  Goal status:NOT MET    PLAN:  PT FREQUENCY: 2x/week   PT DURATION: 4 weeks  PLANNED INTERVENTIONS: 97110-Therapeutic exercises, 97530- Therapeutic activity, 97112- Neuromuscular re-education, 97535- Self Care, 02859- Manual therapy, [phone]- Gait training, [phone]- Aquatic Therapy, [phone]- Electrical stimulation  (unattended), [phone]- Traction (mechanical), [phone] (1-2 muscles), [phone] (3+ muscles)- Dry Needling, Patient/Family education, Balance training, Stair training, Taping, Joint mobilization, Spinal mobilization, DME instructions, Cryotherapy, and Moist heat.  PLAN FOR NEXT SESSION: Patient to discharge home with HEP  Kristeen Sar, PT, DPT 10/11/2024 5:10 PM   PHYSICAL THERAPY DISCHARGE SUMMARY  Visits from Start of Care: 13  Current functional level related to goals / functional outcomes: See above   Remaining deficits: Radiating leg pain   Education / Equipment: See above   Patient agrees to discharge. Patient goals were partially met. Patient is being discharged due to being pleased with the current functional level.

## 2024-10-13 ENCOUNTER — Ambulatory Visit

## 2024-10-13 DIAGNOSIS — I639 Cerebral infarction, unspecified: Secondary | ICD-10-CM

## 2024-10-14 LAB — CUP PACEART REMOTE DEVICE CHECK
Date Time Interrogation Session: 20251219232250
Implantable Pulse Generator Implant Date: 20230106

## 2024-10-15 NOTE — Progress Notes (Signed)
 Remote Loop Recorder Transmission

## 2024-10-24 ENCOUNTER — Ambulatory Visit: Payer: Self-pay | Admitting: Cardiovascular Disease

## 2024-10-30 NOTE — Progress Notes (Signed)
 " PAIN LUMBAR MEDIAL BRANCH BLOCK  Performed by: Toribio Fairy Badder, MD Authorized by: Alana Search, PA-C   Procedure:  Pain Lumbar Medial Branch Block Procedure Laterality:  Bilateral Procedure Level: Level 2     [facility]  PATIENT NAME: Megan Chaney PATIENT MRN: [id number] DATE OF SURGERY: 10/30/2024  TITLE OF PROCEDURE:  1) Lumbar Medial Branch Blocks #1/2  INDICATION AND PHYSICAL EXAM: [name]'s pain is facet joint (z-joint) mediated disease that is aggravated by hyperextension, rotation or lateral bending.  The patient has point tenderness of the paraspinal musculature consistent with facet mediated pain. This was confirmed by provocative testing today.   FACET JOINT(S) TREATED: L4-L5 and L5-S1  NERVE ROOTS TREATED: L3, L4, and L5  SIDE: Bilateral  PREOPERATIVE DIAGNOSES: Lumbar Facet Arthropathy  POSTOPERATIVE DIAGNOSES:  Same  LOCATION: [facility]  SURGEON: Toribio PARAS Bintrim MD  ASSISTANTS: Luke Banner MD  ANESTHESIA: Local only  DESCRIPTION OF OPERATIVE PROCEDURE: Prior to initiation of the procedure, informed consent was obtained, and the patient was made aware of the possibility of pain during the procedure or failure of the procedure to relieve pain. The patient was brought to the procedure room and positioned to comfort and optimal positioning for procedure.  A time-out was conducted with all members of the care team as per [facility] protocol.  Standard ASA monitors were placed. The patient was placed in the prone position and fluoroscopy was used to identify the planned levels of injection, then standard prep and drape were performed.  After identifying the proper lumbar levels for injection under AP fluoroscopy, the superior junction of the transverse processes (or sacral ala) and superior articular processes of each level as noted above and their corresponding nerve  root was identified and marked at the skin with a sterile marking pen. Then, the skin was anesthetized at the marked sites using 1% lidocaine  administered via a 0.5 inch 25G needle, and 3.5in 25G needles were guided under intermittent AP fluoroscopy to the appropriate levels for injection. Adequate needle depth and position was confirmed and, after negative aspiration for blood, air, or CSF, the needles were injected with 0.5cc of 0.5% bupivacaine, then withdrawn. The patient did not experience lingering pain or paresthesia with injection.  The surgical site preparation was washed off of the patient. Hemostasis was appreciated and bandages were applied as needed. The patient tolerated the procedure well and was escorted to the recovery area. The patient was monitored for an appropriate amount of time and standard discharge instructions were reviewed.  The patient knows how to contact the clinic should she have any questions or problems, and she was discharged in stable condition without evidence of a motor block.  Preprocedural pain score was  6/10 Post procedural pain score was 4/10  COMPLICATIONS: None  EBL: None  PLAN: The patient will be called following the procedure and asked to recount their pain diary results. If the patient received significant pain relief of the treated area in a period 2-6 hours after the procedure while conducting normal pain-provoking activities, the patient will then be scheduled for a second MBB (if this was the first MBB) or RF denervation (if this was the second MBB) of the same levels on the same side (or patients most painful side).  There is no expectation that the patient will receive pain relief past the 2-6 hour time frame of the local anesthetic.  Toribio PARAS Bintrim MD  "

## 2024-10-31 NOTE — Telephone Encounter (Signed)
" °  Patient is post procedure Bilateral Lumbar  L4/5 and L5/S1  PMCPAINSCOREPERHOUR: Hourly Post Operative Nerve Block Pain Scores, Pre Operative pain score: 6/10, 1st Hour: 2/10, 2nd Hour: 2/10, 3rd Hour: 4/10, 4th Hour: 4/10, 5th Hour: 5/10, and 6th Hour: 5/10  Patient pain reduction post op hours 0-6: 80%: Patient reports > 80% improvement with blocks: Patient states improved pain and function: Yes, Case requests have been placed per protocol. Will forward to scheduling department to contact patient to schedule procedure.  Current Pain Score: 5 "

## 2024-11-05 ENCOUNTER — Encounter

## 2024-11-10 ENCOUNTER — Encounter

## 2024-11-13 ENCOUNTER — Ambulatory Visit

## 2024-11-13 DIAGNOSIS — I639 Cerebral infarction, unspecified: Secondary | ICD-10-CM

## 2024-11-13 NOTE — Progress Notes (Signed)
 " PAIN LUMBAR MEDIAL BRANCH BLOCK  Performed by: Toribio Fairy Badder, MD Authorized by: Alana Search, PA-C   Procedure:  Pain Lumbar Medial Branch Block Procedure Laterality:  Bilateral Procedure Level: Level 2    [facility]  PATIENT NAME: Megan Chaney PATIENT MRN: [id number] DATE OF SURGERY: 11/13/2024  TITLE OF PROCEDURE:  1) Lumbar Medial Branch Blocks #2/2  INDICATION AND PHYSICAL EXAM: Olivia Viereck's pain is facet joint (z-joint) mediated disease that is aggravated by hyperextension, rotation or lateral bending.  The patient has point tenderness of the paraspinal musculature consistent with facet mediated pain. This was confirmed by provocative testing today. Greater than 80% pain relief was obtained by the first diagnostic block of these nerves confirmed with the patient today  FACET JOINT(S) TREATED: L4-L5 and L5-S1  NERVE ROOTS TREATED: L3, L4, and L5  SIDE: Bilateral  PREOPERATIVE DIAGNOSES: Lumbar Facet Arthropathy  POSTOPERATIVE DIAGNOSES:  Same  LOCATION: [facility]  SURGEON: Toribio PARAS Bintrim MD  ASSISTANTS: None  ANESTHESIA: Local only  DESCRIPTION OF OPERATIVE PROCEDURE: Prior to initiation of the procedure, informed consent was obtained, and the patient was made aware of the possibility of pain during the procedure or failure of the procedure to relieve pain. The patient was brought to the procedure room and positioned to comfort and optimal positioning for procedure.  A time-out was conducted with all members of the care team as per [facility] protocol.  Standard ASA monitors were placed. The patient was placed in the prone position and fluoroscopy was used to identify the planned levels of injection, then standard prep and drape were performed.  After identifying the proper lumbar levels for injection under AP fluoroscopy, the superior junction of the transverse processes (or  sacral ala) and superior articular processes of each level as noted above and their corresponding nerve root was identified and marked at the skin with a sterile marking pen. Then, the skin was anesthetized at the marked sites using 1% lidocaine  administered via a 0.5 inch 25G needle, and 3.5in 25G needles were guided under intermittent AP fluoroscopy to the appropriate levels for injection. Adequate needle depth and position was confirmed and, after negative aspiration for blood, air, or CSF, the needles were injected with 0.5cc of 0.5% bupivacaine, then withdrawn. The patient did not experience lingering pain or paresthesia with injection.  The surgical site preparation was washed off of the patient. Hemostasis was appreciated and bandages were applied as needed. The patient tolerated the procedure well and was escorted to the recovery area. The patient was monitored for an appropriate amount of time and standard discharge instructions were reviewed.  The patient knows how to contact the clinic should she have any questions or problems, and she was discharged in stable condition without evidence of a motor block.  Preprocedural pain score was  7/10 Post procedural pain score was 4/10  COMPLICATIONS: None  EBL: None  PLAN: The patient will be called following the procedure and asked to recount their pain diary results. If the patient received significant pain relief of the treated area in a period 2-6 hours after the procedure while conducting normal pain-provoking activities, the patient will then be scheduled for a second MBB (if this was the first MBB) or RF denervation (if this was the second MBB) of the same levels on the same side (or patients most painful side).  There is no expectation that the patient will receive pain relief  past the 2-6 hour time frame of the local anesthetic.  Toribio PARAS Bintrim MD  "

## 2024-11-14 LAB — CUP PACEART REMOTE DEVICE CHECK
Date Time Interrogation Session: 20260119231606
Implantable Pulse Generator Implant Date: 20230106

## 2024-11-14 NOTE — Telephone Encounter (Signed)
 Post op call made to pt, pt did not answer mailbox is full unable to leave VM. Pt pre procedure pain score 7/10. Pt post procedure pain score 4/10. Pt s/p LMBB bilateral L4/5 L5/S1 #2/2 with Dr.Bintrim on 11/13/24. Pt encouraged to call pain services nurse triage line [phone] if any issues or concerns should arise.

## 2024-11-15 ENCOUNTER — Other Ambulatory Visit: Payer: Self-pay

## 2024-11-15 ENCOUNTER — Observation Stay (HOSPITAL_COMMUNITY)
Admission: EM | Admit: 2024-11-15 | Discharge: 2024-11-17 | Disposition: A | Attending: Family Medicine | Admitting: Family Medicine

## 2024-11-15 ENCOUNTER — Emergency Department (HOSPITAL_COMMUNITY)

## 2024-11-15 ENCOUNTER — Encounter (HOSPITAL_COMMUNITY): Payer: Self-pay | Admitting: Emergency Medicine

## 2024-11-15 DIAGNOSIS — Z79899 Other long term (current) drug therapy: Secondary | ICD-10-CM | POA: Diagnosis not present

## 2024-11-15 DIAGNOSIS — Z7982 Long term (current) use of aspirin: Secondary | ICD-10-CM | POA: Diagnosis not present

## 2024-11-15 DIAGNOSIS — D649 Anemia, unspecified: Principal | ICD-10-CM | POA: Diagnosis present

## 2024-11-15 DIAGNOSIS — G8929 Other chronic pain: Secondary | ICD-10-CM | POA: Diagnosis not present

## 2024-11-15 DIAGNOSIS — N938 Other specified abnormal uterine and vaginal bleeding: Secondary | ICD-10-CM | POA: Diagnosis not present

## 2024-11-15 DIAGNOSIS — Z91040 Latex allergy status: Secondary | ICD-10-CM | POA: Diagnosis not present

## 2024-11-15 DIAGNOSIS — M545 Low back pain, unspecified: Secondary | ICD-10-CM | POA: Diagnosis not present

## 2024-11-15 DIAGNOSIS — N939 Abnormal uterine and vaginal bleeding, unspecified: Secondary | ICD-10-CM

## 2024-11-15 DIAGNOSIS — Z87891 Personal history of nicotine dependence: Secondary | ICD-10-CM | POA: Diagnosis not present

## 2024-11-15 DIAGNOSIS — Z86718 Personal history of other venous thrombosis and embolism: Secondary | ICD-10-CM | POA: Diagnosis not present

## 2024-11-15 DIAGNOSIS — Z8673 Personal history of transient ischemic attack (TIA), and cerebral infarction without residual deficits: Secondary | ICD-10-CM | POA: Insufficient documentation

## 2024-11-15 LAB — BASIC METABOLIC PANEL WITH GFR
Anion gap: 9 (ref 5–15)
BUN: 16 mg/dL (ref 6–20)
CO2: 25 mmol/L (ref 22–32)
Calcium: 8.7 mg/dL — ABNORMAL LOW (ref 8.9–10.3)
Chloride: 104 mmol/L (ref 98–111)
Creatinine, Ser: 0.61 mg/dL (ref 0.44–1.00)
GFR, Estimated: >60 mL/min
Glucose, Bld: 112 mg/dL — ABNORMAL HIGH (ref 70–99)
Potassium: 4 mmol/L (ref 3.5–5.1)
Sodium: 138 mmol/L (ref 135–145)

## 2024-11-15 LAB — CBC WITH DIFFERENTIAL/PLATELET
Abs Immature Granulocytes: 0.09 K/uL — ABNORMAL HIGH (ref 0.00–0.07)
Basophils Absolute: 0 K/uL (ref 0.0–0.1)
Basophils Relative: 0 %
Eosinophils Absolute: 0 K/uL (ref 0.0–0.5)
Eosinophils Relative: 0 %
HCT: 26.2 % — ABNORMAL LOW (ref 36.0–46.0)
Hemoglobin: 8.5 g/dL — ABNORMAL LOW (ref 12.0–15.0)
Immature Granulocytes: 1 %
Lymphocytes Relative: 9 %
Lymphs Abs: 1.2 K/uL (ref 0.7–4.0)
MCH: 31 pg (ref 26.0–34.0)
MCHC: 32.4 g/dL (ref 30.0–36.0)
MCV: 95.6 fL (ref 80.0–100.0)
Monocytes Absolute: 0.7 K/uL (ref 0.1–1.0)
Monocytes Relative: 5 %
Neutro Abs: 11.8 K/uL — ABNORMAL HIGH (ref 1.7–7.7)
Neutrophils Relative %: 85 %
Platelets: 254 K/uL (ref 150–400)
RBC: 2.74 MIL/uL — ABNORMAL LOW (ref 3.87–5.11)
RDW: 13.9 % (ref 11.5–15.5)
WBC: 13.8 K/uL — ABNORMAL HIGH (ref 4.0–10.5)
nRBC: 0 % (ref 0.0–0.2)

## 2024-11-15 LAB — PREPARE RBC (CROSSMATCH)

## 2024-11-15 LAB — HEMOGLOBIN AND HEMATOCRIT, BLOOD
HCT: 24.1 % — ABNORMAL LOW (ref 36.0–46.0)
Hemoglobin: 7.6 g/dL — ABNORMAL LOW (ref 12.0–15.0)

## 2024-11-15 MED ORDER — MEDROXYPROGESTERONE ACETATE 10 MG PO TABS
10.0000 mg | ORAL_TABLET | Freq: Every day | ORAL | Status: DC
  Filled 2024-11-15 (×2): qty 1

## 2024-11-15 MED ORDER — SODIUM CHLORIDE 0.9% IV SOLUTION: Freq: Once | INTRAVENOUS | Status: AC

## 2024-11-15 NOTE — ED Triage Notes (Signed)
 Pt via POV c/o dizzy spells and heavy vaginal bleeding since 1/20 after Thoracic/Lumbar/Sacral Medial Branch Block performed at [facility] 1/20. Pt says she is using 2 pads at a time and they are saturated within half an hour or less after application with clots. Pt has IUD.

## 2024-11-15 NOTE — ED Provider Notes (Signed)
 " [place] EMERGENCY DEPARTMENT AT [facility] Provider Note   CSN: [id number] Arrival date & time: 11/15/24  1113     Patient presents with: Dizziness and Vaginal Bleeding   Megan Chaney is a 54 y.o. female.   HPI 54 year old female with a history of PFO, uterine leiomyoma, dysfunctional uterine bleeding, CVA, hemiplegia from stroke, hyperlipidemia, IUD, symptomatic anemia, DVT of right upper extremity not on [facility] who presented to the emergency department with complaints of heavy vaginal bleeding and dizziness.  Patient reports having a medial branch block done for chronic back pain from a fall due to her plegia from your stroke on 1/20.  She states that since then she has been having heavy vaginal bleeding since then, which she states is triggered by these spine procedures. She has been changing pads every half hour. She reports history of symptomatic anemia requiring admission for symptomatic anemia and blood transfusions. She has taken Megace  in the past but now has a IUD. She endorses some abdominal cramping.     Prior to Admission medications  Medication Sig Start Date End Date Taking? Authorizing Provider  acetaminophen  (TYLENOL ) 500 MG tablet Take 1,000 mg by mouth every 6 (six) hours as needed for mild pain (pain score 1-3) or moderate pain (pain score 4-6).    [provider]  amitriptyline (ELAVIL) 10 MG tablet Take 10 mg by mouth at bedtime.    [provider]  atorvastatin  (LIPITOR) 80 MG tablet Take 80 mg by mouth at bedtime.    [provider]  baclofen  (LIORESAL ) 10 MG tablet Take 10 mg by mouth every 6 (six) hours as needed for muscle spasms.    [provider]  calcium -vitamin D  (OSCAL 500/200 D-3) 500-200 MG-UNIT tablet Take 1 tablet by mouth 2 (two) times daily. 10/05/19   Fredirick Glenys RAMAN, MD  ferrous sulfate  325 (65 FE) MG tablet Take 1 tablet (325 mg total) by mouth every other day. 08/24/24 09/23/24  Arlon Carliss ORN, DO   FLUoxetine  (PROZAC ) 40 MG capsule Take 40 mg by mouth at bedtime.    [provider]  metoprolol  succinate (TOPROL -XL) 25 MG 24 hr tablet Take 0.5 tablets (12.5 mg total) by mouth daily. Patient taking differently: Take 25 mg by mouth daily. Take one tablet (25mg ) by mouth at bedtime. 11/03/21   Dickie Begun, MD  pregabalin  (LYRICA ) 100 MG capsule Take 100 mg by mouth 2 (two) times daily.    [provider]    Allergies: Latex and Other    Review of Systems Ten systems reviewed and are negative for acute change, except as noted in the HPI.   Updated Vital Signs BP 115/73 (BP Location: Right Arm)   Pulse (!) 109   Temp 98.7 F (37.1 C) (Oral)   Resp 20   Ht 5' 3 (1.6 m)   Wt 68 kg   LMP 11/11/2024 (Exact Date)   SpO2 100%   BMI 26.57 kg/m   Physical Exam Vitals and nursing note reviewed.  Constitutional:      General: She is not in acute distress.    Appearance: She is well-developed.  HENT:     Head: Normocephalic and atraumatic.  Eyes:     Conjunctiva/sclera: Conjunctivae normal.  Cardiovascular:     Rate and Rhythm: Normal rate and regular rhythm.     Heart sounds: No murmur heard. Pulmonary:     Effort: Pulmonary effort is normal. No respiratory distress.     Breath sounds: Normal  breath sounds.  Abdominal:     Palpations: Abdomen is soft.     Tenderness: There is no abdominal tenderness.     Comments: Minimal abdominal tenderness, no guarding     Musculoskeletal:        General: No swelling.     Cervical back: Neck supple.  Skin:    General: Skin is warm and dry.     Capillary Refill: Capillary refill takes less than 2 seconds.  Neurological:     General: No focal deficit present.     Mental Status: She is alert and oriented to person, place, and time.  Psychiatric:        Mood and Affect: Mood normal.   54 year old female  (all labs ordered are listed, but only abnormal results are displayed) Labs Reviewed  CBC WITH  DIFFERENTIAL/PLATELET - Abnormal; Notable for the following components:      Result Value   WBC 13.8 (*)    RBC 2.74 (*)    Hemoglobin 8.5 (*)    HCT 26.2 (*)    Neutro Abs 11.8 (*)    Abs Immature Granulocytes 0.09 (*)    All other components within normal limits  BASIC METABOLIC PANEL WITH GFR - Abnormal; Notable for the following components:   Glucose, Bld 112 (*)    Calcium  8.7 (*)    All other components within normal limits  HEMOGLOBIN AND HEMATOCRIT, BLOOD - Abnormal; Notable for the following components:   Hemoglobin 7.6 (*)    HCT 24.1 (*)    All other components within normal limits  CBC WITH DIFFERENTIAL/PLATELET  TYPE AND SCREEN  PREPARE RBC (CROSSMATCH)    EKG: None  Radiology: US  PELVIC COMPLETE WITH TRANSVAGINAL Result Date: 11/15/2024 EXAM: US  Pelvis, Complete Transvaginal and Transabdominal without Doppler TECHNIQUE: Transabdominal and transvaginal pelvic duplex ultrasound using B-mode/gray scaled imaging without Doppler spectral analysis and color flow was obtained. COMPARISON: 07/08/2024 CLINICAL HISTORY: Heavy vaginal bleeding, history fibroids; IUD x 1 month. FINDINGS: UTERUS: The uterus is anteverted and measures 12.5 x 7.7 x 8.5 cm with a volume of 422.9 mL. The uterus is enlarged and heterogeneous with fibroids present. A large fibroid measures 10.2 x 4.9 x 7.7 cm. ENDOMETRIAL STRIPE: The endometrium is not well visualized, distorted by fibroid. RIGHT OVARY: The right ovary measures 2.9 x 2.0 x 1.8 cm with a volume of 5.5 mL. A 2.4 x 1.0 x 1.5 cm cystic area is present. Color Doppler flow is present. Transvaginal imaging was necessary to visualize the right ovary. LEFT OVARY: The left ovary is not visualized. Color Doppler flow is present. FREE FLUID: No free fluid. OTHER FINDINGS: IUD not visualized. IMPRESSION: 1. Enlarged, heterogeneous uterus with dominant 10.2 cm fibroid. 2. IUD not visualized. Electronically signed by: Pinkie Pebbles MD 11/15/2024 08:38 PM  EST RP Workstation: [id number]     Procedures   Medications Ordered in the ED  0.9 %  sodium chloride  infusion (Manually program via Guardrails IV Fluids) (has no administration in time range)  medroxyPROGESTERone  (PROVERA ) tablet 10 mg (has no administration in time range)    Clinical Course as of 11/15/24 2250  Thu Nov 15, 2024  [id number] 54 year old female with a history of symptomatic anemia from dysfunctional uterine bleeding presenting with heavy bleeding x 2 days with dizziness.  Vitals are stable on arrival other than tachycardic with heart rate of 129.  CBC with leukocytosis of 13.8, hemoglobin of 8.5 which is slightly improved from 8.22 months ago.  BMP is unremarkable.  She  has mild tenderness on exam but low concern for acute abdomen.  Will repeat H&H given her hemoglobin was drawn at 1141, this was 6 hours ago.  Will also obtain pelvic ultrasound to ensure proper IUD placement. [MB]  2201 US  PELVIC COMPLETE WITH TRANSVAGINAL IMPRESSION: 1. Enlarged, heterogeneous uterus with dominant 10.2 cm fibroid. 2. IUD not visualized.  [MB]  2201 Given fairly significant drop on repeat Hgb with symptomatic anemia, will order 1 unit PRBC.  Will page hospitalist for admission. [MB]  2201 Hemoglobin(!): 7.6 [MB]  2249 Spoke with Dr. Charlton who requested OB/GYN consult.  I spoke with OB/GYN who recommends starting Provera  and will need follow-up with her outpatient OB.  I have relayed this to Dr. Charlton. [MB]    Clinical Course User Index [MB] Vonn Hadassah LABOR, PA-C                                 Medical Decision Making Amount and/or Complexity of Data Reviewed Labs: ordered. Radiology: ordered.  Risk Prescription drug management.        Final diagnoses:  Symptomatic anemia  Vaginal bleeding    ED Discharge Orders     None          Vonn Hadassah LABOR DEVONNA 11/15/24 2250    Cottie Donnice PARAS, MD 11/15/24 2308  "

## 2024-11-15 NOTE — ED Provider Triage Note (Signed)
 Emergency Medicine Provider Triage Evaluation Note  Megan Chaney , a 54 y.o. female  was evaluated in triage.  Pt complains vaginal bleeding - notes hx of fibroids, chronic anemia, irregular vaginal bleeding - had IUD placed ~ 1 month ago, bleeding had resolved but now recurs in past few days. Is changing pad q 30-60 min. Feels lightheaded when stands. No other abnormal bruising or bleeding. No melena or rectal bleeding. No abd or pelvic pain.   Review of Systems  Positive: Vaginal bleeding Negative: Abd pain. Fever.   Physical Exam  BP 118/84 (BP Location: Right Arm)   Pulse (!) 129   Temp 98.1 F (36.7 C) (Oral)   Resp 18   Ht 1.6 m (5' 3)   Wt 68 kg   LMP 11/11/2024 (Exact Date)   SpO2 100%   BMI 26.57 kg/m  Gen:   Awake, no distress  conj pale Resp:  Normal effort breathing comfortably.  Abd:   Soft non tender.    Medical Decision Making  Medically screening exam initiated at 11:32 AM.  Appropriate orders placed.  Celine Dishman was informed that the remainder of the evaluation will be completed by another provider, this initial triage assessment does not replace that evaluation, and the importance of remaining in the ED until their evaluation is complete.  Labs sent.    Bernard Drivers, MD 11/15/24 1134

## 2024-11-15 NOTE — Consult Note (Signed)
" ° °  OB/GYN Telephone Consult  Megan Chaney is a 54 y.o. G3P0030 presenting with AUB and anemia..   I was called for a consult regarding the care of this patient by [facility].    The provider had a clinical question regarding management options.    The provider presented the following relevant clinical information and I performed a chart review on the patient and reviewed available documentation:  The patient has a longstanding history of AUB and anemia. She had bled down to a hgb of 5.4 previously. She has a gynecologist through [facility] who in December placed an IUD in December of 2025. She had an US  here in the ED today and it did not show the IUD but does show multiple uterine fibroids.   She previously was on Megace  but then this was discontinued due to her history of stroke and DVT. She is not currently on any blood thinners.   Her starting hgb in the ED is 8.5 and dropped to 7.6 while in the ED.   By exam, her provider notes some clots in the vault but not active heavy bleeding.   I reviewed her pelvic ultrasound images independently and my findings are: consistent with a fibroid uterus that is mainly one large dominant fibroid that encompasses the majority of the uterus. Her right ovary has a simple cyst. Her left ovary was not visualized. I also could not see the IUD within the uterus.   I reviewed CareEverywhere:  - I reviewed the GYN note from 12/19 which reports IUD insertion.   BP 115/73 (BP Location: Right Arm)   Pulse (!) 109   Temp 98.7 F (37.1 C) (Oral)   Resp 20   Ht 5' 3 (1.6 m)   Wt 68 kg   LMP 11/11/2024 (Exact Date)   SpO2 100%   BMI 26.57 kg/m   Exam- performed by consulting provider   Recommendations:  - I reviewed my previous consult note as well as Dr. Sallee note from 9/14. The patient has been followed with [facility] with Dr. Verna. She has attempted multiple IUDs. She has tried Megace  but had weight gain on this and  prefers not to take it.  - Would try Provera  10 mg to start since bleeding is not heavy by exam and her HgB is starting higher. If her bleeding is refractory to this, then I would recommend Megace  40 mg BID again as a short course until her follow up with her primary OB/GYN.  - Her alternative to these therapies would be uterine artery embolization and hysterectomy although reasonable to avoid major surgery due to her history of stroke and DVT.  - If possible, I would encourage her being able to make she scheduled follow up with her primary OB/GYN.    Thank you for this consult and if additional recommendations are needed please call [phone] for the OB/GYN attending on service at [facility].   I spent approximately 5 minutes directly consulting with the provider and verbally discussing this case. Additionally 15 minutes minutes was spent performing chart review and documentation.   Vina Solian, MD Attending Obstetrician & Gynecologist, [facility] for [facility], [facility] "

## 2024-11-15 NOTE — ED Notes (Signed)
ULTRASOUND

## 2024-11-16 ENCOUNTER — Encounter (HOSPITAL_COMMUNITY): Payer: Self-pay | Admitting: Family Medicine

## 2024-11-16 DIAGNOSIS — M545 Low back pain, unspecified: Secondary | ICD-10-CM

## 2024-11-16 DIAGNOSIS — D649 Anemia, unspecified: Secondary | ICD-10-CM | POA: Diagnosis not present

## 2024-11-16 LAB — CBC
HCT: 25.9 % — ABNORMAL LOW (ref 36.0–46.0)
HCT: 25.6 % — ABNORMAL LOW (ref 36.0–46.0)
HCT: 25.4 % — ABNORMAL LOW (ref 36.0–46.0)
Hemoglobin: 8.5 g/dL — ABNORMAL LOW (ref 12.0–15.0)
Hemoglobin: 8.5 g/dL — ABNORMAL LOW (ref 12.0–15.0)
Hemoglobin: 8.4 g/dL — ABNORMAL LOW (ref 12.0–15.0)
MCH: 30.1 pg (ref 26.0–34.0)
MCH: 29.4 pg (ref 26.0–34.0)
MCH: 29.5 pg (ref 26.0–34.0)
MCHC: 32.8 g/dL (ref 30.0–36.0)
MCHC: 33.2 g/dL (ref 30.0–36.0)
MCHC: 33.1 g/dL (ref 30.0–36.0)
MCV: 91.8 fL (ref 80.0–100.0)
MCV: 88.6 fL (ref 80.0–100.0)
MCV: 89.1 fL (ref 80.0–100.0)
Platelets: 194 K/uL (ref 150–400)
Platelets: 220 K/uL (ref 150–400)
Platelets: 232 K/uL (ref 150–400)
RBC: 2.82 MIL/uL — ABNORMAL LOW (ref 3.87–5.11)
RBC: 2.89 MIL/uL — ABNORMAL LOW (ref 3.87–5.11)
RBC: 2.85 MIL/uL — ABNORMAL LOW (ref 3.87–5.11)
RDW: 16.3 % — ABNORMAL HIGH (ref 11.5–15.5)
RDW: 16.5 % — ABNORMAL HIGH (ref 11.5–15.5)
RDW: 16.6 % — ABNORMAL HIGH (ref 11.5–15.5)
WBC: 8.7 K/uL (ref 4.0–10.5)
WBC: 8.9 K/uL (ref 4.0–10.5)
WBC: 9.4 K/uL (ref 4.0–10.5)
nRBC: 0 % (ref 0.0–0.2)
nRBC: 0 % (ref 0.0–0.2)
nRBC: 0.6 % — ABNORMAL HIGH (ref 0.0–0.2)

## 2024-11-16 LAB — BASIC METABOLIC PANEL WITH GFR
Anion gap: 9 (ref 5–15)
BUN: 15 mg/dL (ref 6–20)
CO2: 23 mmol/L (ref 22–32)
Calcium: 8.4 mg/dL — ABNORMAL LOW (ref 8.9–10.3)
Chloride: 103 mmol/L (ref 98–111)
Creatinine, Ser: 0.58 mg/dL (ref 0.44–1.00)
GFR, Estimated: >60 mL/min
Glucose, Bld: 100 mg/dL — ABNORMAL HIGH (ref 70–99)
Potassium: 3.9 mmol/L (ref 3.5–5.1)
Sodium: 135 mmol/L (ref 135–145)

## 2024-11-16 MED ORDER — MEDROXYPROGESTERONE ACETATE 10 MG PO TABS
10.0000 mg | ORAL_TABLET | Freq: Three times a day (TID) | ORAL | Status: DC
  Administered 2024-11-16 – 2024-11-17 (×5): 10 mg via ORAL
  Filled 2024-11-16 (×8): qty 1

## 2024-11-16 MED ORDER — FLUOXETINE HCL 20 MG PO CAPS
40.0000 mg | ORAL_CAPSULE | Freq: Every day | ORAL | Status: DC
  Administered 2024-11-16: 40 mg via ORAL
  Filled 2024-11-16: qty 2

## 2024-11-16 MED ORDER — ACETAMINOPHEN 650 MG RE SUPP: 650.0000 mg | Freq: Four times a day (QID) | RECTAL | Status: DC | PRN

## 2024-11-16 MED ORDER — ATORVASTATIN CALCIUM 80 MG PO TABS
80.0000 mg | ORAL_TABLET | Freq: Every day | ORAL | Status: DC
  Administered 2024-11-16: 80 mg via ORAL
  Filled 2024-11-16 (×2): qty 1

## 2024-11-16 MED ORDER — OXYCODONE HCL 5 MG PO TABS: 5.0000 mg | ORAL_TABLET | ORAL | Status: DC | PRN

## 2024-11-16 MED ORDER — SENNOSIDES-DOCUSATE SODIUM 8.6-50 MG PO TABS: 1.0000 | ORAL_TABLET | Freq: Every evening | ORAL | Status: DC | PRN

## 2024-11-16 MED ORDER — SODIUM CHLORIDE 0.9% FLUSH
3.0000 mL | Freq: Two times a day (BID) | INTRAVENOUS | Status: DC
  Administered 2024-11-16 – 2024-11-17 (×2): 3 mL via INTRAVENOUS

## 2024-11-16 MED ORDER — ONDANSETRON HCL 4 MG/2ML IJ SOLN: 4.0000 mg | Freq: Four times a day (QID) | INTRAMUSCULAR | Status: DC | PRN

## 2024-11-16 MED ORDER — BACLOFEN 10 MG PO TABS: 10.0000 mg | ORAL_TABLET | Freq: Four times a day (QID) | ORAL | Status: DC | PRN

## 2024-11-16 MED ORDER — ONDANSETRON HCL 4 MG PO TABS: 4.0000 mg | ORAL_TABLET | Freq: Four times a day (QID) | ORAL | Status: DC | PRN

## 2024-11-16 MED ORDER — METOPROLOL SUCCINATE ER 25 MG PO TB24
25.0000 mg | ORAL_TABLET | Freq: Every day | ORAL | Status: DC
  Administered 2024-11-16: 25 mg via ORAL
  Filled 2024-11-16 (×2): qty 1

## 2024-11-16 MED ORDER — ACETAMINOPHEN 325 MG PO TABS: 650.0000 mg | ORAL_TABLET | Freq: Four times a day (QID) | ORAL | Status: DC | PRN

## 2024-11-16 MED ORDER — PREGABALIN 100 MG PO CAPS
100.0000 mg | ORAL_CAPSULE | Freq: Two times a day (BID) | ORAL | Status: DC
  Administered 2024-11-16 – 2024-11-17 (×2): 100 mg via ORAL
  Filled 2024-11-16 (×2): qty 1
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
## 2024-11-16 NOTE — Progress Notes (Signed)
" ° °  Brief Progress Note   _____________________________________________________________________________________________________________  Patient Name: Megan Chaney Patient DOB: January 21, 1971 Date: @TODAY @      Data: Patient awaiting admission to a Telemetry bed at [facility].    Action: Radio Broadcast Assistant on 1S OB Specialty Unit to assess patient appropriateness for unit placement.    Response:  Patient deemed appropriate for the unit; bed assignment received.  _____________________________________________________________________________________________________________  [facility] RN Expeditor Bridget Appleton S Lyrica Mcclarty Please contact us  directly via secure chat (search for [facility]) or by calling us  at [phone] [facility]).  "

## 2024-11-16 NOTE — Progress Notes (Signed)
 [facility] night cross cover note:   I was notified by the patient's RN of the patient's request for resumption of her home metoprolol , pregabalin , and fluoxetine .  Per brief chart review, recent heart rates were in the low 100s, with systolic blood pressures in the 1 teens to 130s.  Most recent sodium was found to be within normal limits and renal function appears to be at baseline.  Subsequent resumed her home metoprolol , pregabalin , and fluoxetine , per patient request.    Eva Pore, DO Hospitalist

## 2024-11-16 NOTE — Progress Notes (Signed)
 Remote Loop Recorder Transmission

## 2024-11-16 NOTE — Progress Notes (Signed)
 " PROGRESS NOTE  Megan Chaney  FMW:[id number] DOB: 06-16-1971 DOA: 11/15/2024 PCP: Stephane Leita DEL, MD  Consultants  Brief Narrative:  54 y.o. female with medical history significant for upper extremity DVT no longer anticoagulated, PFO, cryptogenic CVA, chronic low back pain, abnormal uterine bleeding, and chronic anemia who presents with heavy menstrual bleeding and lightheadedness.   Patient reports heavy menstrual bleeding for the past 2 days.  She has developed lightheadedness, particularly upon standing.  She denies any chest pain, syncope, melena, hematochezia, or hematemesis.   ED Course: Upon arrival to the ED, patient is found to be afebrile and saturating well on room air with elevated heart rate and stable blood pressure.  Labs are most notable for WBC 13,800 and hemoglobin 8.5 which decreased to 7.6 over the course of 9 hours while in the ED.  Pelvic ultrasound reveals enlarged heterogenous uterus with a dominant 10.2 cm fibroid.   ED PA discussed the case with OB/GYN who recommended starting Provera  and following up with her primary OB/GYN.  1 unit RBC was ordered for transfusion from the ED.   Assessment & Plan: Symptomatic anemia; abnormal uterine bleeding  - Status post 1 unit packed red blood cells in ED. -Reports still with some bleeding though it has somewhat slowed. - OBGYN recommends starting Provera .   - Start Provera , trend CBCs, hold ASA for now   -On Provera  10 mg p.o. 3 times daily for 5 days total. -She unfortunately had an appointment with her GYN this morning but missed that appointment because she was here in the hospital. -Plan will be to likely watch 1 more night.  Follow hemoglobin trend.  If bleeding slows/stops and hemoglobin remains stable likely able to discharge home 1/24   Hx of CVA  - Continue Lipitor , hold ASA for now     Chronic low back pain  - Continue Baclofen  as needed    DVT prophylaxis:  SCDs Start: 11/16/24 0203  Code Status:   Code  Status: Full Code Level of care: Telemetry Status is: Observation   Consults called: Telemetry consult with OB/GYN done while patient in ER  Subjective: Patient feels much better this morning than she has.  No further dizziness.  No pain.  No headaches.  Feels more confident walking around the room to get up to go to the bathroom.  Still with bleeding though believes it is somewhat lightened.  Objective: Vitals:   11/16/24 0600 11/16/24 0831 11/16/24 0852 11/16/24 1140  BP: 103/88 112/69 108/61 (!) 111/58  Pulse: 97 99 (!) 109 89  Resp: 20 (!) 22 19 18   Temp:  98.5 F (36.9 C) 98 F (36.7 C) 98.1 F (36.7 C)  TempSrc:  Oral Oral Oral  SpO2: 100% 100% 100% 100%  Weight:      Height:        Intake/Output Summary (Last 24 hours) at 11/16/2024 1357 Last data filed at 11/16/2024 0411 Gross per 24 hour  Intake 333.5 ml  Output --  Net 333.5 ml   Filed Weights   11/15/24 1121  Weight: 68 kg   Body mass index is 26.57 kg/m.  Gen: 54 y.o. female in no apparent distress.  Nontoxic Pulm: Non-labored breathing.  Clear to auscultation bilaterally.  CV: Regular rate and rhythm. No murmur, rub, or gallop. No JVD GI: Abdomen soft, nondistended.  Minimally tender suprapubic region. Ext: Warm, no deformities, no pedal edema Skin: No rashes, lesions no ulcers Neuro: Alert and oriented. No focal neurological deficits. Psych: Calm  Judgement and insight appear normal. Mood & affect appropriate.     I have personally reviewed the following labs and images: CBC: Recent Labs  Lab 11/15/24 1139 11/15/24 2045 11/16/24 0639 11/16/24 1011  WBC 13.8*  --  8.7 8.9  NEUTROABS 11.8*  --   --   --   HGB 8.5* 7.6* 8.5* 8.5*  HCT 26.2* 24.1* 25.9* 25.6*  MCV 95.6  --  91.8 88.6  PLT 254  --  194 220   BMP &GFR Recent Labs  Lab 11/15/24 1139 11/16/24 0639  NA 138 135  K 4.0 3.9  CL 104 103  CO2 25 23  GLUCOSE 112* 100*  BUN 16 15  CREATININE 0.61 0.58  CALCIUM  8.7* 8.4*    Estimated Creatinine Clearance: 77 mL/min (by C-G formula based on SCr of 0.58 mg/dL). Liver & Pancreas: No results for input(s): AST, ALT, ALKPHOS, BILITOT, PROT, ALBUMIN in the last 168 hours. No results for input(s): LIPASE, AMYLASE in the last 168 hours. No results for input(s): AMMONIA in the last 168 hours. Diabetic: No results for input(s): HGBA1C in the last 72 hours. No results for input(s): GLUCAP in the last 168 hours. Cardiac Enzymes: No results for input(s): CKTOTAL, CKMB, CKMBINDEX, TROPONINI in the last 168 hours. No results for input(s): PROBNP in the last 8760 hours. Coagulation Profile: No results for input(s): INR, PROTIME in the last 168 hours. Thyroid Function Tests: No results for input(s): TSH, T4TOTAL, FREET4, T3FREE, THYROIDAB in the last 72 hours. Lipid Profile: No results for input(s): CHOL, HDL, LDLCALC, TRIG, CHOLHDL, LDLDIRECT in the last 72 hours. Anemia Panel: No results for input(s): VITAMINB12, FOLATE, FERRITIN, TIBC, IRON , RETICCTPCT in the last 72 hours. Urine analysis:    Component Value Date/Time   COLORURINE YELLOW 08/23/2024 1540   APPEARANCEUR CLOUDY (A) 08/23/2024 1540   LABSPEC >1.030 (H) 08/23/2024 1540   PHURINE 6.0 08/23/2024 1540   GLUCOSEU 100 (A) 08/23/2024 1540   HGBUR LARGE (A) 08/23/2024 1540   BILIRUBINUR NEGATIVE 08/23/2024 1540   KETONESUR NEGATIVE 08/23/2024 1540   PROTEINUR 30 (A) 08/23/2024 1540   UROBILINOGEN 0.2 01/27/2013 1542   NITRITE NEGATIVE 08/23/2024 1540   LEUKOCYTESUR NEGATIVE 08/23/2024 1540   Sepsis Labs: Invalid input(s): PROCALCITONIN, LACTICIDVEN  Microbiology: No results found for this or any previous visit (from the past 240 hours).  Radiology Studies: US  PELVIC COMPLETE WITH TRANSVAGINAL Result Date: 11/15/2024 EXAM: US  Pelvis, Complete Transvaginal and Transabdominal without Doppler TECHNIQUE: Transabdominal and  transvaginal pelvic duplex ultrasound using B-mode/gray scaled imaging without Doppler spectral analysis and color flow was obtained. COMPARISON: 07/08/2024 CLINICAL HISTORY: Heavy vaginal bleeding, history fibroids; IUD x 1 month. FINDINGS: UTERUS: The uterus is anteverted and measures 12.5 x 7.7 x 8.5 cm with a volume of 422.9 mL. The uterus is enlarged and heterogeneous with fibroids present. A large fibroid measures 10.2 x 4.9 x 7.7 cm. ENDOMETRIAL STRIPE: The endometrium is not well visualized, distorted by fibroid. RIGHT OVARY: The right ovary measures 2.9 x 2.0 x 1.8 cm with a volume of 5.5 mL. A 2.4 x 1.0 x 1.5 cm cystic area is present. Color Doppler flow is present. Transvaginal imaging was necessary to visualize the right ovary. LEFT OVARY: The left ovary is not visualized. Color Doppler flow is present. FREE FLUID: No free fluid. OTHER FINDINGS: IUD not visualized. IMPRESSION: 1. Enlarged, heterogeneous uterus with dominant 10.2 cm fibroid. 2. IUD not visualized. Electronically signed by: Pinkie Pebbles MD 11/15/2024 08:38 PM EST RP Workstation: [id number]  Scheduled Meds:  atorvastatin   80 mg Oral QHS   medroxyPROGESTERone   10 mg Oral TID   sodium chloride  flush  3 mL Intravenous Q12H   Continuous Infusions:   LOS: 0 days   35 minutes with more than 50% spent in reviewing records, counseling patient/family and coordinating care.  Reyes VEAR Gaw, MD [facility] [email or web address] 11/16/2024, 1:57 PM    "

## 2024-11-16 NOTE — H&P (Signed)
 " History and Physical    Megan Chaney FMW:[id number] DOB: 12-25-1970 DOA: 11/15/2024  PCP: Stephane Leita DEL, MD   Patient coming from: Home   Chief Complaint: Heavy menstrual bleeding, lightheaded   HPI: Megan Chaney is a 54 y.o. female with medical history significant for upper extremity DVT no longer anticoagulated, PFO, cryptogenic CVA, chronic low back pain, abnormal uterine bleeding, and chronic anemia who presents with heavy menstrual bleeding and lightheadedness.  Patient reports heavy menstrual bleeding for the past 2 days.  She has developed lightheadedness, particularly upon standing.  She denies any chest pain, syncope, melena, hematochezia, or hematemesis.  ED Course: Upon arrival to the ED, patient is found to be afebrile and saturating well on room air with elevated heart rate and stable blood pressure.  Labs are most notable for WBC 13,800 and hemoglobin 8.5 which decreased to 7.6 over the course of 9 hours while in the ED.  Pelvic ultrasound reveals enlarged heterogenous uterus with a dominant 10.2 cm fibroid.  ED PA discussed the case with OB/GYN who recommended starting Provera  and following up with her primary OB/GYN.  1 unit RBC was ordered for transfusion from the ED.  Review of Systems:  All other systems reviewed and apart from HPI, are negative.  Past Medical History:  Diagnosis Date   Acute deep vein thrombosis (DVT) of right upper extremity (HCC) 11/03/2021   Anemia    Fibroid    GERD (gastroesophageal reflux disease)     Past Surgical History:  Procedure Laterality Date   BUBBLE STUDY  10/30/2021   Procedure: BUBBLE STUDY;  Surgeon: Delford Maude BROCKS, MD;  Location: [facility] ENDOSCOPY;  Service: Cardiovascular;;   LOOP RECORDER INSERTION N/A 10/30/2021   Procedure: LOOP RECORDER INSERTION;  Surgeon: Cindie Ole DASEN, MD;  Location: [facility] INVASIVE CV LAB;  Service: Cardiovascular;  Laterality: N/A;   MULTIPLE TOOTH EXTRACTIONS     NO PAST SURGERIES     TEE WITHOUT  CARDIOVERSION N/A 10/30/2021   Procedure: TRANSESOPHAGEAL ECHOCARDIOGRAM (TEE);  Surgeon: Delford Maude BROCKS, MD;  Location: [facility] ENDOSCOPY;  Service: Cardiovascular;  Laterality: N/A;    Social History:   reports that she quit smoking about 25 years ago. Her smoking use included cigarettes. She has never used smokeless tobacco. She reports current alcohol use. She reports that she does not currently use drugs after having used the following drugs: Marijuana.  Allergies[1]  Family History  Problem Relation Age of Onset   Chronic Renal Failure Father    Miscarriages / Stillbirths Father    Heart attack Mother    Stroke Mother    Cancer Mother    Diabetes Mother      Prior to Admission medications  Medication Sig Start Date End Date Taking? Authorizing Provider  acetaminophen  (TYLENOL ) 500 MG tablet Take 1,000 mg by mouth every 6 (six) hours as needed for mild pain (pain score 1-3) or moderate pain (pain score 4-6).   Yes [provider]  amitriptyline (ELAVIL) 10 MG tablet Take 10 mg by mouth at bedtime as needed for sleep.   Yes [provider]  aspirin  EC 81 MG tablet Take 81 mg by mouth at bedtime. Swallow whole.   Yes [provider]  atorvastatin  (LIPITOR) 80 MG tablet Take 80 mg by mouth at bedtime.   Yes [provider]  baclofen  (LIORESAL ) 10 MG tablet Take 10 mg by mouth every 6 (six) hours as needed for muscle spasms.   Yes [provider]  Calcium  Carb-Cholecalciferol  (  CALCIUM  600+D3) 600-20 MG-MCG TABS Take 1 tablet by mouth in the morning and at bedtime.   Yes [provider]  ferrous sulfate  325 (65 FE) MG tablet Take 1 tablet (325 mg total) by mouth every other day. Patient taking differently: Take 325 mg by mouth every other day. Take one tablet by mouth every other day at bedtime. 08/24/24 01/22/25 Yes Arlon Carliss ORN, DO  FLUoxetine  (PROZAC ) 40 MG capsule Take 40 mg by mouth at bedtime.   Yes [provider]   metoprolol  succinate (TOPROL -XL) 25 MG 24 hr tablet Take 0.5 tablets (12.5 mg total) by mouth daily. Patient taking differently: Take 25 mg by mouth daily. Take one tablet (25mg ) by mouth at bedtime. 11/03/21  Yes Dickie Begun, MD  pregabalin  (LYRICA ) 100 MG capsule Take 100 mg by mouth 2 (two) times daily.   Yes [provider]    Physical Exam: Vitals:   11/15/24 2345 11/16/24 0110 11/16/24 0115 11/16/24 0142  BP: 134/70   109/88  Pulse: (!) 110 100 (!) 107 (!) 115  Resp:  20 18 16   Temp:    98.3 F (36.8 C)  TempSrc:    Oral  SpO2: 100% 100% 100% 100%  Weight:      Height:        Constitutional: NAD, calm  Eyes: PERTLA, lids and conjunctivae normal ENMT: Mucous membranes are moist. Posterior pharynx clear of any exudate or lesions.   Neck: supple, no masses  Respiratory: no wheezing, no crackles. No accessory muscle use.  Cardiovascular: S1 & S2 heard, regular rate and rhythm. No extremity edema.  Abdomen: No tenderness, soft. Bowel sounds active.  Musculoskeletal: no clubbing / cyanosis. No joint deformity upper and lower extremities.   Skin: no significant rashes, lesions, ulcers. Warm, dry, well-perfused. Neurologic: CN 2-12 grossly intact. Moving all extremities. Alert and oriented.  Psychiatric: Pleasant. Cooperative.    Labs and Imaging on Admission: I have personally reviewed following labs and imaging studies  CBC: Recent Labs  Lab 11/15/24 1139 11/15/24 2045  WBC 13.8*  --   NEUTROABS 11.8*  --   HGB 8.5* 7.6*  HCT 26.2* 24.1*  MCV 95.6  --   PLT 254  --    Basic Metabolic Panel: Recent Labs  Lab 11/15/24 1139  NA 138  K 4.0  CL 104  CO2 25  GLUCOSE 112*  BUN 16  CREATININE 0.61  CALCIUM  8.7*   GFR: Estimated Creatinine Clearance: 77 mL/min (by C-G formula based on SCr of 0.61 mg/dL). Liver Function Tests: No results for input(s): AST, ALT, ALKPHOS, BILITOT, PROT, ALBUMIN in the last 168 hours. No results for input(s):  LIPASE, AMYLASE in the last 168 hours. No results for input(s): AMMONIA in the last 168 hours. Coagulation Profile: No results for input(s): INR, PROTIME in the last 168 hours. Cardiac Enzymes: No results for input(s): CKTOTAL, CKMB, CKMBINDEX, TROPONINI in the last 168 hours. BNP (last 3 results) No results for input(s): PROBNP in the last 8760 hours. HbA1C: No results for input(s): HGBA1C in the last 72 hours. CBG: No results for input(s): GLUCAP in the last 168 hours. Lipid Profile: No results for input(s): CHOL, HDL, LDLCALC, TRIG, CHOLHDL, LDLDIRECT in the last 72 hours. Thyroid Function Tests: No results for input(s): TSH, T4TOTAL, FREET4, T3FREE, THYROIDAB in the last 72 hours. Anemia Panel: No results for input(s): VITAMINB12, FOLATE, FERRITIN, TIBC, IRON , RETICCTPCT in the last 72 hours. Urine analysis:    Component Value Date/Time   COLORURINE  YELLOW 08/23/2024 1540   APPEARANCEUR CLOUDY (A) 08/23/2024 1540   LABSPEC >1.030 (H) 08/23/2024 1540   PHURINE 6.0 08/23/2024 1540   GLUCOSEU 100 (A) 08/23/2024 1540   HGBUR LARGE (A) 08/23/2024 1540   BILIRUBINUR NEGATIVE 08/23/2024 1540   KETONESUR NEGATIVE 08/23/2024 1540   PROTEINUR 30 (A) 08/23/2024 1540   UROBILINOGEN 0.2 01/27/2013 1542   NITRITE NEGATIVE 08/23/2024 1540   LEUKOCYTESUR NEGATIVE 08/23/2024 1540   Sepsis Labs: @LABRCNTIP (procalcitonin:4,lacticidven:4) )No results found for this or any previous visit (from the past 240 hours).   Radiological Exams on Admission: US  PELVIC COMPLETE WITH TRANSVAGINAL Result Date: 11/15/2024 EXAM: US  Pelvis, Complete Transvaginal and Transabdominal without Doppler TECHNIQUE: Transabdominal and transvaginal pelvic duplex ultrasound using B-mode/gray scaled imaging without Doppler spectral analysis and color flow was obtained. COMPARISON: 07/08/2024 CLINICAL HISTORY: Heavy vaginal bleeding, history fibroids; IUD x 1 month.  FINDINGS: UTERUS: The uterus is anteverted and measures 12.5 x 7.7 x 8.5 cm with a volume of 422.9 mL. The uterus is enlarged and heterogeneous with fibroids present. A large fibroid measures 10.2 x 4.9 x 7.7 cm. ENDOMETRIAL STRIPE: The endometrium is not well visualized, distorted by fibroid. RIGHT OVARY: The right ovary measures 2.9 x 2.0 x 1.8 cm with a volume of 5.5 mL. A 2.4 x 1.0 x 1.5 cm cystic area is present. Color Doppler flow is present. Transvaginal imaging was necessary to visualize the right ovary. LEFT OVARY: The left ovary is not visualized. Color Doppler flow is present. FREE FLUID: No free fluid. OTHER FINDINGS: IUD not visualized. IMPRESSION: 1. Enlarged, heterogeneous uterus with dominant 10.2 cm fibroid. 2. IUD not visualized. Electronically signed by: Pinkie Pebbles MD 11/15/2024 08:38 PM EST RP Workstation: [id number]    Assessment/Plan   1. Symptomatic anemia; abnormal uterine bleeding  - She is being transfused with 1 unit RBC in ED  - OBGYN recommends starting Provera   - Start Provera , trend CBCs, hold ASA for now    2. Hx of CVA  - Continue Lipitor, hold ASA for now    3. Chronic low back pain  - Continue Baclofen  as needed    DVT prophylaxis: SCDs  Code Status: Full  Level of Care: Level of care: Telemetry Family Communication: none present  Disposition Plan:  Patient is from: home  Anticipated d/c is to: Home  Anticipated d/c date is: 1/23 or 11/17/24 Patient currently: Pending clinical stability  Consults called: None  Admission status: Observation     Evalene GORMAN Sprinkles, MD [facility]  11/16/2024, 1:55 AM       [1]  Allergies Allergen Reactions   Latex Hives and Rash   Other Hives and Rash    Powder in latex gloves   "

## 2024-11-16 NOTE — ED Notes (Signed)
 No reactions noted during start of blood transfusion

## 2024-11-17 DIAGNOSIS — D649 Anemia, unspecified: Secondary | ICD-10-CM | POA: Diagnosis not present

## 2024-11-17 LAB — TYPE AND SCREEN
ABO/RH(D): O POS
Antibody Screen: NEGATIVE
Unit division: 0

## 2024-11-17 LAB — BPAM RBC
Blood Product Expiration Date: 202602182359
ISSUE DATE / TIME: 202601230129
Unit Type and Rh: 5100

## 2024-11-17 MED ORDER — MEDROXYPROGESTERONE ACETATE 10 MG PO TABS: 10.0000 mg | ORAL_TABLET | Freq: Three times a day (TID) | ORAL | 0 refills | Status: AC

## 2024-11-17 NOTE — Discharge Summary (Signed)
 " Physician Discharge Summary   Patient: Megan Chaney MRN: [id number] DOB: 1971-06-16  Admit date:     11/15/2024  Discharge date: 11/17/24  Discharge Physician: Reyes VEAR Gaw   PCP: Stephane Leita VEAR, MD   Recommendations at discharge:   Patient discharged home with 9 more days of Provera  for 10 total days. She missed her appointment with her GYN scheduled because of her vaginal bleeding because she was here in the hospital.  She was recommended to reschedule this as soon as possible for definitive treatment of her vaginal bleeding  Discharge Diagnoses: Principal Problem:   Symptomatic anemia Active Problems:   DUB (dysfunctional uterine bleeding)   History of CVA (cerebrovascular accident)   Chronic low back pain  Resolved Problems:   * No resolved hospital problems. *  Hospital Course:  54 y.o. female with medical history significant for upper extremity DVT no longer anticoagulated, PFO, cryptogenic CVA, chronic low back pain, abnormal uterine bleeding, and chronic anemia who presents with heavy menstrual bleeding and lightheadedness.   Patient reports heavy menstrual bleeding for the past 2 days.  She has developed lightheadedness, particularly upon standing.  She denies any chest pain, syncope, melena, hematochezia, or hematemesis.  Upon arrival to the ED, patient found to be afebrile and saturating well on room air with elevated heart rate and stable blood pressure.  Labs are most notable for WBC 13,800 and hemoglobin 8.5 which decreased to 7.6 over the course of 9 hours while in the ED.  Pelvic ultrasound reveals enlarged heterogenous uterus with a dominant 10.2 cm fibroid.  ED PA discussed the case with OB/GYN who recommended starting Provera  and following up with her primary OB/GYN.  1 unit RBC was ordered for transfusion from the ED.  After being admitted we did serial H/H measurements.  Reported that her bleeding, although not completely stopped, greatly slowed.  Hemoglobin  remained steady after blood transfusion at 8.5.  Her dizziness resolved.  Because of how well she was doing, the fact that her anemia was no longer symptomatic, that her bleeding is slowed, and that her blood counts remained stable she was able to be discharged home on 11/17/2024     Assessment & Plan: Symptomatic anemia; abnormal uterine bleeding  - Status post 1 unit packed red blood cells in ED. - OBGYN recommends starting Provera .   - Start Provera , trend CBCs, hold ASA for now   -On Provera  10 mg p.o. 3 times daily for 10 days total -She unfortunately had an appointment with her GYN but missed that appointment because she was here in the hospital.   Hx of CVA  - Continue Lipitor , hold ASA for now     Chronic low back pain  - Continue Baclofen  as needed        Consultants: Telephone consultation with OB/GYN Procedures performed: None Disposition: Home Diet recommendation:  Regular diet DISCHARGE MEDICATION: Allergies as of 11/17/2024       Reactions   Latex Hives, Rash   Other Hives, Rash   Powder in latex gloves        Medication List     STOP taking these medications    amitriptyline 10 MG tablet Commonly known as: ELAVIL       TAKE these medications    acetaminophen  500 MG tablet Commonly known as: TYLENOL  Take 1,000 mg by mouth every 6 (six) hours as needed for mild pain (pain score 1-3) or moderate pain (pain score 4-6).   aspirin  EC 81 MG  tablet Take 81 mg by mouth at bedtime. Swallow whole.   atorvastatin  80 MG tablet Commonly known as: LIPITOR  Take 80 mg by mouth at bedtime.   baclofen  10 MG tablet Commonly known as: LIORESAL  Take 10 mg by mouth every 6 (six) hours as needed for muscle spasms.   Calcium  600+D3 600-20 MG-MCG Tabs Generic drug: Calcium  Carb-Cholecalciferol  Take 1 tablet by mouth in the morning and at bedtime.   ferrous sulfate  325 (65 FE) MG tablet Take 1 tablet (325 mg total) by mouth every other day. What changed:  additional instructions   FLUoxetine  40 MG capsule Commonly known as: PROZAC  Take 40 mg by mouth at bedtime.   medroxyPROGESTERone  10 MG tablet Commonly known as: PROVERA  Take 1 tablet (10 mg total) by mouth 3 (three) times daily.   metoprolol  succinate 25 MG 24 hr tablet Commonly known as: TOPROL -XL Take 0.5 tablets (12.5 mg total) by mouth daily. What changed:  how much to take additional instructions   pregabalin  100 MG capsule Commonly known as: LYRICA  Take 100 mg by mouth 2 (two) times daily.        Discharge Exam: Filed Weights   11/15/24 1121  Weight: 68 kg   Gen: 54 y.o. female in no apparent distress.  Nontoxic HEENT: No conjunctival pallor Pulm: Non-labored breathing.  Clear to auscultation bilaterally.  CV: Regular rate and rhythm. No murmur, rub, or gallop. No JVD GI: Abdomen soft, nondistended, nontender Ext: Warm, no deformities, no pedal edema Skin: No rashes, lesions no ulcers Neuro: Alert and oriented. No focal neurological deficits. Psych: Calm  Judgement and insight appear normal. Mood & affect appropriate.    Condition at discharge: good  The results of significant diagnostics from this hospitalization (including imaging, microbiology, ancillary and laboratory) are listed below for reference.   Imaging Studies: US  PELVIC COMPLETE WITH TRANSVAGINAL Result Date: 11/15/2024 EXAM: US  Pelvis, Complete Transvaginal and Transabdominal without Doppler TECHNIQUE: Transabdominal and transvaginal pelvic duplex ultrasound using B-mode/gray scaled imaging without Doppler spectral analysis and color flow was obtained. COMPARISON: 07/08/2024 CLINICAL HISTORY: Heavy vaginal bleeding, history fibroids; IUD x 1 month. FINDINGS: UTERUS: The uterus is anteverted and measures 12.5 x 7.7 x 8.5 cm with a volume of 422.9 mL. The uterus is enlarged and heterogeneous with fibroids present. A large fibroid measures 10.2 x 4.9 x 7.7 cm. ENDOMETRIAL STRIPE: The endometrium is not  well visualized, distorted by fibroid. RIGHT OVARY: The right ovary measures 2.9 x 2.0 x 1.8 cm with a volume of 5.5 mL. A 2.4 x 1.0 x 1.5 cm cystic area is present. Color Doppler flow is present. Transvaginal imaging was necessary to visualize the right ovary. LEFT OVARY: The left ovary is not visualized. Color Doppler flow is present. FREE FLUID: No free fluid. OTHER FINDINGS: IUD not visualized. IMPRESSION: 1. Enlarged, heterogeneous uterus with dominant 10.2 cm fibroid. 2. IUD not visualized. Electronically signed by: Pinkie Pebbles MD 11/15/2024 08:38 PM EST RP Workstation: [id number]   CUP PACEART REMOTE DEVICE CHECK Result Date: 11/14/2024 ILR summary report received. Battery status OK. Normal device function. No new symptom, tachy, brady, or pause episodes. No new AF episodes. Monthly summary reports and ROV/PRN AB, CVRS   Microbiology: Results for orders placed or performed during the hospital encounter of 08/23/24  Wet prep, genital     Status: Abnormal   Collection Time: 08/23/24  8:20 PM   Specimen: PATH Cytology Cervicovaginal Ancillary Only  Result Value Ref Range Status   Yeast Wet Prep HPF POC (A)  NONE SEEN Final    Swab received with less than 0.5 mL of saline, saline added to specimen, interpret results with caution.   Trich, Wet Prep NONE SEEN NONE SEEN Final   Clue Cells Wet Prep HPF POC PRESENT (A) NONE SEEN Final   WBC, Wet Prep HPF POC <10 <10 Final   Sperm NONE SEEN  Final    Comment: Performed at [facility], [address] [place], [place] [address]    Labs: CBC: Recent Labs  Lab 11/15/24 1139 11/15/24 2045 11/16/24 0639 11/16/24 1011 11/16/24 1759  WBC 13.8*  --  8.7 8.9 9.4  NEUTROABS 11.8*  --   --   --   --   HGB 8.5* 7.6* 8.5* 8.5* 8.4*  HCT 26.2* 24.1* 25.9* 25.6* 25.4*  MCV 95.6  --  91.8 88.6 89.1  PLT 254  --  194 220 232   Basic Metabolic Panel: Recent Labs  Lab 11/15/24 1139 11/16/24 0639  NA 138 135  K 4.0 3.9  CL 104 103   CO2 25 23  GLUCOSE 112* 100*  BUN 16 15  CREATININE 0.61 0.58  CALCIUM  8.7* 8.4*   Liver Function Tests: No results for input(s): AST, ALT, ALKPHOS, BILITOT, PROT, ALBUMIN in the last 168 hours. CBG: No results for input(s): GLUCAP in the last 168 hours.  Discharge time spent: Less than 30 minutes.  Signed: Reyes VEAR Gaw, MD [facility] 11/17/2024 "

## 2024-11-20 ENCOUNTER — Ambulatory Visit: Payer: Self-pay | Admitting: Cardiovascular Disease

## 2024-12-11 ENCOUNTER — Encounter

## 2024-12-14 ENCOUNTER — Ambulatory Visit

## 2025-01-14 ENCOUNTER — Ambulatory Visit

## 2025-02-14 ENCOUNTER — Ambulatory Visit

## 2025-03-17 ENCOUNTER — Ambulatory Visit

## 2025-04-17 ENCOUNTER — Ambulatory Visit

## 2025-05-18 ENCOUNTER — Ambulatory Visit

## 2025-06-18 ENCOUNTER — Ambulatory Visit

## 2025-07-19 ENCOUNTER — Ambulatory Visit

## 2025-08-19 ENCOUNTER — Ambulatory Visit

## 2025-09-19 ENCOUNTER — Ambulatory Visit

## 2025-10-20 ENCOUNTER — Ambulatory Visit

## 2025-11-20 ENCOUNTER — Ambulatory Visit
